# Patient Record
Sex: Male | Born: 1955 | Race: White | Hispanic: No | Marital: Married | Smoking: Current every day smoker
Health system: Southern US, Community
[De-identification: ages and names within clinical notes are randomized; demographics above are authoritative.]

## PROBLEM LIST (undated history)

## (undated) DIAGNOSIS — F419 Anxiety disorder, unspecified: Secondary | ICD-10-CM

## (undated) DIAGNOSIS — J449 Chronic obstructive pulmonary disease, unspecified: Secondary | ICD-10-CM

## (undated) DIAGNOSIS — Z972 Presence of dental prosthetic device (complete) (partial): Secondary | ICD-10-CM

## (undated) DIAGNOSIS — C099 Malignant neoplasm of tonsil, unspecified: Secondary | ICD-10-CM

## (undated) DIAGNOSIS — J410 Simple chronic bronchitis: Secondary | ICD-10-CM

## (undated) DIAGNOSIS — J690 Pneumonitis due to inhalation of food and vomit: Secondary | ICD-10-CM

## (undated) DIAGNOSIS — R32 Unspecified urinary incontinence: Secondary | ICD-10-CM

## (undated) DIAGNOSIS — G629 Polyneuropathy, unspecified: Secondary | ICD-10-CM

## (undated) DIAGNOSIS — R413 Other amnesia: Secondary | ICD-10-CM

## (undated) DIAGNOSIS — M1990 Unspecified osteoarthritis, unspecified site: Secondary | ICD-10-CM

## (undated) DIAGNOSIS — C801 Malignant (primary) neoplasm, unspecified: Secondary | ICD-10-CM

## (undated) DIAGNOSIS — K9423 Gastrostomy malfunction: Principal | ICD-10-CM

## (undated) HISTORY — PX: TONSILLECTOMY: SUR1361

## (undated) HISTORY — DX: Malignant neoplasm of tonsil, unspecified: C09.9

## (undated) HISTORY — DX: Gastrostomy malfunction: K94.23

## (undated) HISTORY — PX: APPENDECTOMY: SHX54

## (undated) HISTORY — DX: Other amnesia: R41.3

## (undated) HISTORY — DX: Polyneuropathy, unspecified: G62.9

---

## 1999-11-22 ENCOUNTER — Emergency Department (HOSPITAL_COMMUNITY): Admission: EM | Admit: 1999-11-22 | Discharge: 1999-11-22 | Payer: Self-pay | Admitting: Emergency Medicine

## 1999-11-22 ENCOUNTER — Encounter: Payer: Self-pay | Admitting: Emergency Medicine

## 2005-08-18 HISTORY — PX: CHOLECYSTECTOMY: SHX55

## 2006-06-10 ENCOUNTER — Emergency Department: Payer: Self-pay | Admitting: Emergency Medicine

## 2007-06-05 ENCOUNTER — Emergency Department: Payer: Self-pay | Admitting: Emergency Medicine

## 2008-03-23 ENCOUNTER — Other Ambulatory Visit: Payer: Self-pay

## 2008-03-24 ENCOUNTER — Inpatient Hospital Stay: Payer: Self-pay | Admitting: Internal Medicine

## 2008-06-09 ENCOUNTER — Emergency Department: Payer: Self-pay | Admitting: Emergency Medicine

## 2008-07-18 ENCOUNTER — Ambulatory Visit: Payer: Self-pay | Admitting: Radiation Oncology

## 2008-08-04 ENCOUNTER — Ambulatory Visit: Payer: Self-pay | Admitting: Otolaryngology

## 2008-08-07 ENCOUNTER — Inpatient Hospital Stay: Payer: Self-pay | Admitting: Otolaryngology

## 2008-08-18 ENCOUNTER — Ambulatory Visit: Payer: Self-pay | Admitting: Radiation Oncology

## 2008-08-22 ENCOUNTER — Ambulatory Visit: Payer: Self-pay | Admitting: Radiation Oncology

## 2008-08-31 ENCOUNTER — Ambulatory Visit: Payer: Self-pay | Admitting: General Surgery

## 2008-09-09 ENCOUNTER — Emergency Department: Payer: Self-pay | Admitting: Emergency Medicine

## 2008-09-18 ENCOUNTER — Ambulatory Visit: Payer: Self-pay | Admitting: Radiation Oncology

## 2008-10-16 ENCOUNTER — Ambulatory Visit: Payer: Self-pay | Admitting: Radiation Oncology

## 2008-11-16 ENCOUNTER — Ambulatory Visit: Payer: Self-pay | Admitting: Radiation Oncology

## 2008-11-27 ENCOUNTER — Inpatient Hospital Stay: Payer: Self-pay | Admitting: Oncology

## 2008-12-02 ENCOUNTER — Inpatient Hospital Stay: Payer: Self-pay | Admitting: Internal Medicine

## 2008-12-16 ENCOUNTER — Ambulatory Visit: Payer: Self-pay | Admitting: Radiation Oncology

## 2009-01-16 ENCOUNTER — Ambulatory Visit: Payer: Self-pay | Admitting: Radiation Oncology

## 2009-02-15 ENCOUNTER — Ambulatory Visit: Payer: Self-pay | Admitting: Radiation Oncology

## 2009-03-18 ENCOUNTER — Ambulatory Visit: Payer: Self-pay | Admitting: Radiation Oncology

## 2009-04-18 ENCOUNTER — Ambulatory Visit: Payer: Self-pay | Admitting: Radiation Oncology

## 2009-04-30 ENCOUNTER — Ambulatory Visit: Payer: Self-pay | Admitting: Oncology

## 2009-05-18 ENCOUNTER — Ambulatory Visit: Payer: Self-pay | Admitting: Radiation Oncology

## 2009-06-18 ENCOUNTER — Ambulatory Visit: Payer: Self-pay | Admitting: Radiation Oncology

## 2009-06-18 ENCOUNTER — Ambulatory Visit: Payer: Self-pay | Admitting: Oncology

## 2009-06-29 ENCOUNTER — Emergency Department: Payer: Self-pay | Admitting: Emergency Medicine

## 2009-07-18 ENCOUNTER — Ambulatory Visit: Payer: Self-pay | Admitting: Radiation Oncology

## 2009-08-18 ENCOUNTER — Ambulatory Visit: Payer: Self-pay | Admitting: Radiation Oncology

## 2009-08-23 ENCOUNTER — Ambulatory Visit: Payer: Self-pay | Admitting: Oncology

## 2009-09-05 ENCOUNTER — Ambulatory Visit: Payer: Self-pay | Admitting: Radiation Oncology

## 2009-09-06 ENCOUNTER — Emergency Department: Payer: Self-pay | Admitting: Emergency Medicine

## 2009-09-16 ENCOUNTER — Emergency Department: Payer: Self-pay | Admitting: Emergency Medicine

## 2009-09-18 ENCOUNTER — Ambulatory Visit: Payer: Self-pay | Admitting: Radiation Oncology

## 2009-10-05 ENCOUNTER — Ambulatory Visit: Payer: Self-pay | Admitting: Otolaryngology

## 2009-10-23 ENCOUNTER — Ambulatory Visit: Payer: Self-pay | Admitting: Oncology

## 2009-11-16 ENCOUNTER — Ambulatory Visit: Payer: Self-pay | Admitting: Oncology

## 2009-12-16 ENCOUNTER — Ambulatory Visit: Payer: Self-pay | Admitting: Oncology

## 2010-01-03 ENCOUNTER — Emergency Department (HOSPITAL_COMMUNITY): Admission: EM | Admit: 2010-01-03 | Discharge: 2010-01-04 | Payer: Self-pay | Admitting: Internal Medicine

## 2010-01-04 ENCOUNTER — Ambulatory Visit: Payer: Self-pay | Admitting: Psychiatry

## 2010-01-04 ENCOUNTER — Inpatient Hospital Stay (HOSPITAL_COMMUNITY): Admission: EM | Admit: 2010-01-04 | Discharge: 2010-01-08 | Payer: Self-pay | Admitting: Psychiatry

## 2010-01-16 ENCOUNTER — Ambulatory Visit: Payer: Self-pay | Admitting: Oncology

## 2010-01-17 ENCOUNTER — Ambulatory Visit: Payer: Self-pay | Admitting: Oncology

## 2010-02-07 ENCOUNTER — Ambulatory Visit: Payer: Self-pay | Admitting: Surgery

## 2010-02-11 ENCOUNTER — Ambulatory Visit: Payer: Self-pay | Admitting: Surgery

## 2010-02-15 ENCOUNTER — Ambulatory Visit: Payer: Self-pay | Admitting: Oncology

## 2010-03-18 ENCOUNTER — Ambulatory Visit: Payer: Self-pay | Admitting: Oncology

## 2010-04-18 ENCOUNTER — Ambulatory Visit: Payer: Self-pay | Admitting: Oncology

## 2010-05-18 ENCOUNTER — Ambulatory Visit: Payer: Self-pay | Admitting: Oncology

## 2010-06-18 ENCOUNTER — Ambulatory Visit: Payer: Self-pay | Admitting: Oncology

## 2010-07-30 ENCOUNTER — Ambulatory Visit: Payer: Self-pay | Admitting: Oncology

## 2010-08-18 ENCOUNTER — Ambulatory Visit: Payer: Self-pay | Admitting: Oncology

## 2010-10-30 ENCOUNTER — Ambulatory Visit: Payer: Self-pay | Admitting: Oncology

## 2010-11-04 LAB — COMPREHENSIVE METABOLIC PANEL
ALT: 28 U/L (ref 0–53)
AST: 56 U/L — ABNORMAL HIGH (ref 0–37)
Albumin: 4.3 g/dL (ref 3.5–5.2)
Alkaline Phosphatase: 83 U/L (ref 39–117)
BUN: 9 mg/dL (ref 6–23)
CO2: 26 mEq/L (ref 19–32)
Calcium: 9.2 mg/dL (ref 8.4–10.5)
Chloride: 103 mEq/L (ref 96–112)
Creatinine, Ser: 0.84 mg/dL (ref 0.4–1.5)
GFR calc Af Amer: >60 mL/min (ref >60)
GFR calc non Af Amer: >60 mL/min (ref >60)
Glucose, Bld: 155 mg/dL — ABNORMAL HIGH (ref 70–99)
Potassium: 3.5 mEq/L (ref 3.5–5.1)
Sodium: 142 mEq/L (ref 135–145)
Total Bilirubin: 0.4 mg/dL (ref 0.3–1.2)
Total Protein: 7.4 g/dL (ref 6.0–8.3)

## 2010-11-04 LAB — CBC
HCT: 38.1 % — ABNORMAL LOW (ref 39.0–52.0)
Hemoglobin: 11.4 g/dL — ABNORMAL LOW (ref 13.0–17.0)
Hemoglobin: 13.3 g/dL (ref 13.0–17.0)
MCHC: 33.5 g/dL (ref 30.0–36.0)
MCHC: 34.9 g/dL (ref 30.0–36.0)
MCV: 92 fL (ref 78.0–100.0)
Platelets: 212 10*3/uL (ref 150–400)
Platelets: 213 10*3/uL (ref 150–400)
RBC: 4.14 MIL/uL — ABNORMAL LOW (ref 4.22–5.81)
RDW: 13.3 % (ref 11.5–15.5)
RDW: 13.4 % (ref 11.5–15.5)
WBC: 8.3 10*3/uL (ref 4.0–10.5)

## 2010-11-04 LAB — DIFFERENTIAL
Basophils Absolute: 0 10*3/uL (ref 0.0–0.1)
Basophils Relative: 0 % (ref 0–1)
Eosinophils Absolute: 0.1 10*3/uL (ref 0.0–0.7)
Eosinophils Relative: 1 % (ref 0–5)
Lymphocytes Relative: 13 % (ref 12–46)
Lymphs Abs: 1.1 10*3/uL (ref 0.7–4.0)
Monocytes Absolute: 0.7 10*3/uL (ref 0.1–1.0)
Monocytes Relative: 8 % (ref 3–12)
Neutro Abs: 6.5 10*3/uL (ref 1.7–7.7)
Neutrophils Relative %: 78 % — ABNORMAL HIGH (ref 43–77)

## 2010-11-04 LAB — URINALYSIS, ROUTINE W REFLEX MICROSCOPIC
Nitrite: NEGATIVE
Specific Gravity, Urine: 1.007 (ref 1.005–1.030)
Urobilinogen, UA: 0.2 mg/dL (ref 0.0–1.0)
pH: 6 (ref 5.0–8.0)

## 2010-11-04 LAB — BASIC METABOLIC PANEL
BUN: 13 mg/dL (ref 6–23)
CO2: 27 mEq/L (ref 19–32)
Calcium: 8.8 mg/dL (ref 8.4–10.5)
Creatinine, Ser: 1.18 mg/dL (ref 0.4–1.5)
Glucose, Bld: 99 mg/dL (ref 70–99)
Sodium: 140 mEq/L (ref 135–145)

## 2010-11-04 LAB — SALICYLATE LEVEL: Salicylate Lvl: <4 mg/dL (ref 2.8–20.0)

## 2010-11-04 LAB — URINE MICROSCOPIC-ADD ON

## 2010-11-04 LAB — PROTIME-INR: INR: 0.98 (ref 0.00–1.49)

## 2010-11-04 LAB — RAPID URINE DRUG SCREEN, HOSP PERFORMED: Tetrahydrocannabinol: NOT DETECTED

## 2010-11-04 LAB — APTT: aPTT: 28 seconds (ref 24–37)

## 2010-11-04 LAB — ACETAMINOPHEN LEVEL: Acetaminophen (Tylenol), Serum: <10 ug/mL — ABNORMAL LOW (ref 10–30)

## 2010-11-04 LAB — CLOSTRIDIUM DIFFICILE EIA

## 2010-11-04 LAB — ETHANOL: Alcohol, Ethyl (B): 165 mg/dL — ABNORMAL HIGH (ref 0–10)

## 2010-11-17 ENCOUNTER — Ambulatory Visit: Payer: Self-pay | Admitting: Oncology

## 2010-11-21 ENCOUNTER — Emergency Department: Payer: Self-pay | Admitting: Emergency Medicine

## 2011-01-29 ENCOUNTER — Ambulatory Visit: Payer: Self-pay | Admitting: Oncology

## 2011-02-16 ENCOUNTER — Ambulatory Visit: Payer: Self-pay | Admitting: Oncology

## 2011-03-27 ENCOUNTER — Emergency Department: Payer: Self-pay | Admitting: Internal Medicine

## 2011-03-28 ENCOUNTER — Emergency Department: Payer: Self-pay | Admitting: Unknown Physician Specialty

## 2011-05-01 ENCOUNTER — Ambulatory Visit: Payer: Self-pay | Admitting: Oncology

## 2011-05-05 ENCOUNTER — Ambulatory Visit: Payer: Self-pay | Admitting: Oncology

## 2011-05-19 ENCOUNTER — Ambulatory Visit: Payer: Self-pay | Admitting: Oncology

## 2011-09-18 ENCOUNTER — Emergency Department (HOSPITAL_COMMUNITY): Payer: No Typology Code available for payment source

## 2011-09-18 ENCOUNTER — Encounter (HOSPITAL_COMMUNITY): Payer: Self-pay | Admitting: Emergency Medicine

## 2011-09-18 ENCOUNTER — Emergency Department (HOSPITAL_COMMUNITY)
Admission: EM | Admit: 2011-09-18 | Discharge: 2011-09-18 | Disposition: A | Discharge status: Home / Self Care | Payer: No Typology Code available for payment source | Attending: Emergency Medicine | Admitting: Emergency Medicine

## 2011-09-18 DIAGNOSIS — M545 Low back pain, unspecified: Secondary | ICD-10-CM | POA: Insufficient documentation

## 2011-09-18 DIAGNOSIS — F17200 Nicotine dependence, unspecified, uncomplicated: Secondary | ICD-10-CM | POA: Insufficient documentation

## 2011-09-18 DIAGNOSIS — Z79899 Other long term (current) drug therapy: Secondary | ICD-10-CM | POA: Insufficient documentation

## 2011-09-18 HISTORY — DX: Malignant (primary) neoplasm, unspecified: C80.1

## 2011-09-18 MED ORDER — IBUPROFEN 800 MG PO TABS: 800.0000 mg | ORAL_TABLET | Freq: Three times a day (TID) | ORAL | Status: AC | PRN

## 2011-09-18 MED ORDER — DIAZEPAM 5 MG PO TABS: 5.0000 mg | ORAL_TABLET | Freq: Three times a day (TID) | ORAL | Status: AC | PRN
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
## 2011-09-19 NOTE — ED Provider Notes (Signed)
Medical screening examination/treatment/procedure(s) were performed by non-physician practitioner and as supervising physician I was immediately available for consultation/collaboration.  Chi Garlow M Eyad Rochford, MD 09/19/11 1715 

## 2011-12-05 ENCOUNTER — Ambulatory Visit: Payer: Self-pay | Admitting: Oncology

## 2011-12-05 LAB — CBC CANCER CENTER
Basophil %: 0.3 %
Eosinophil #: 0.1 x10 3/mm (ref 0.0–0.7)
Eosinophil %: 0.8 %
HCT: 38.7 % — ABNORMAL LOW (ref 40.0–52.0)
Lymphocyte %: 11.4 %
MCHC: 32.6 g/dL (ref 32.0–36.0)
Monocyte %: 4.4 %
Neutrophil #: 6.5 x10 3/mm (ref 1.4–6.5)
Platelet: 189 x10 3/mm (ref 150–440)
WBC: 7.8 x10 3/mm (ref 3.8–10.6)

## 2011-12-17 ENCOUNTER — Ambulatory Visit: Payer: Self-pay | Admitting: Oncology

## 2012-06-11 ENCOUNTER — Ambulatory Visit: Payer: Self-pay | Admitting: Oncology

## 2012-06-11 LAB — CBC CANCER CENTER
Basophil #: 0 x10 3/mm (ref 0.0–0.1)
Basophil %: 0.6 %
Eosinophil #: 0.1 x10 3/mm (ref 0.0–0.7)
HCT: 36 % — ABNORMAL LOW (ref 40.0–52.0)
MCH: 28.9 pg (ref 26.0–34.0)
MCHC: 32 g/dL (ref 32.0–36.0)
Monocyte #: 0.3 x10 3/mm (ref 0.2–1.0)
Monocyte %: 4.2 %
Neutrophil %: 81.4 %
Platelet: 296 x10 3/mm (ref 150–440)
RDW: 14.1 % (ref 11.5–14.5)

## 2012-06-11 LAB — COMPREHENSIVE METABOLIC PANEL
Alkaline Phosphatase: 109 U/L (ref 50–136)
Calcium, Total: 8.7 mg/dL (ref 8.5–10.1)
Co2: 28 mmol/L (ref 21–32)
EGFR (African American): >60
EGFR (Non-African Amer.): >60
Glucose: 189 mg/dL — ABNORMAL HIGH (ref 65–99)
SGPT (ALT): 20 U/L (ref 12–78)

## 2012-06-18 ENCOUNTER — Ambulatory Visit: Payer: Self-pay | Admitting: Oncology

## 2012-06-21 ENCOUNTER — Inpatient Hospital Stay: Payer: Self-pay | Admitting: Internal Medicine

## 2012-06-21 LAB — COMPREHENSIVE METABOLIC PANEL
Alkaline Phosphatase: 138 U/L — ABNORMAL HIGH (ref 50–136)
BUN: 8 mg/dL (ref 7–18)
Bilirubin,Total: 0.8 mg/dL (ref 0.2–1.0)
Chloride: 96 mmol/L — ABNORMAL LOW (ref 98–107)
Creatinine: 1.1 mg/dL (ref 0.60–1.30)
EGFR (African American): >60
EGFR (Non-African Amer.): >60
Potassium: 3.8 mmol/L (ref 3.5–5.1)
SGOT(AST): 39 U/L — ABNORMAL HIGH (ref 15–37)
SGPT (ALT): 29 U/L (ref 12–78)
Total Protein: 7.5 g/dL (ref 6.4–8.2)

## 2012-06-21 LAB — TROPONIN I: Troponin-I: <0.02 ng/mL

## 2012-06-21 LAB — CBC
HCT: 30.5 % — ABNORMAL LOW (ref 40.0–52.0)
HGB: 10 g/dL — ABNORMAL LOW (ref 13.0–18.0)
MCH: 29 pg (ref 26.0–34.0)
MCHC: 32.9 g/dL (ref 32.0–36.0)
MCV: 88 fL (ref 80–100)
RDW: 13.8 % (ref 11.5–14.5)

## 2012-06-21 LAB — URINALYSIS, COMPLETE
Bilirubin,UR: NEGATIVE
Ketone: NEGATIVE
Leukocyte Esterase: NEGATIVE
Ph: 7 (ref 4.5–8.0)
Protein: NEGATIVE
RBC,UR: NONE SEEN /HPF (ref 0–5)
Specific Gravity: 1.003 (ref 1.003–1.030)
Squamous Epithelial: <1

## 2012-06-22 LAB — CBC WITH DIFFERENTIAL/PLATELET
Basophil %: 0 %
Eosinophil #: 0 10*3/uL (ref 0.0–0.7)
Eosinophil %: 0 %
HCT: 31.7 % — ABNORMAL LOW (ref 40.0–52.0)
Lymphocyte %: 3.4 %
MCHC: 33.9 g/dL (ref 32.0–36.0)
Monocyte %: 1.3 %
Neutrophil #: 8.2 10*3/uL — ABNORMAL HIGH (ref 1.4–6.5)
Neutrophil %: 95.3 %
RDW: 13.7 % (ref 11.5–14.5)
WBC: 8.6 10*3/uL (ref 3.8–10.6)

## 2012-06-22 LAB — COMPREHENSIVE METABOLIC PANEL
Albumin: 2.7 g/dL — ABNORMAL LOW (ref 3.4–5.0)
Alkaline Phosphatase: 124 U/L (ref 50–136)
BUN: 12 mg/dL (ref 7–18)
Bilirubin,Total: 0.7 mg/dL (ref 0.2–1.0)
Co2: 27 mmol/L (ref 21–32)
Creatinine: 0.99 mg/dL (ref 0.60–1.30)
EGFR (African American): >60
EGFR (Non-African Amer.): >60
Potassium: 3.8 mmol/L (ref 3.5–5.1)
SGPT (ALT): 25 U/L (ref 12–78)

## 2012-06-22 LAB — CK: CK, Total: 43 U/L (ref 35–232)

## 2012-06-23 LAB — URINE CULTURE

## 2012-06-24 ENCOUNTER — Ambulatory Visit: Payer: Self-pay | Admitting: Oncology

## 2012-06-27 LAB — CULTURE, BLOOD (SINGLE)

## 2012-06-29 ENCOUNTER — Encounter: Payer: Self-pay | Admitting: Cardiology

## 2012-07-26 ENCOUNTER — Ambulatory Visit: Payer: Self-pay | Admitting: Otolaryngology
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
## 2012-10-17 ENCOUNTER — Inpatient Hospital Stay: Payer: Self-pay | Admitting: Internal Medicine

## 2012-10-17 LAB — URINALYSIS, COMPLETE
Bacteria: NONE SEEN
Bilirubin,UR: NEGATIVE
Blood: NEGATIVE
Ketone: NEGATIVE
Leukocyte Esterase: NEGATIVE
Nitrite: NEGATIVE
Protein: NEGATIVE
Squamous Epithelial: <1

## 2012-10-17 LAB — COMPREHENSIVE METABOLIC PANEL
Alkaline Phosphatase: 91 U/L (ref 50–136)
Anion Gap: 3 — ABNORMAL LOW (ref 7–16)
BUN: 24 mg/dL — ABNORMAL HIGH (ref 7–18)
Bilirubin,Total: 0.4 mg/dL (ref 0.2–1.0)
Calcium, Total: 8.3 mg/dL — ABNORMAL LOW (ref 8.5–10.1)
Chloride: 95 mmol/L — ABNORMAL LOW (ref 98–107)
Creatinine: 1.32 mg/dL — ABNORMAL HIGH (ref 0.60–1.30)
EGFR (African American): >60
EGFR (Non-African Amer.): 60 — ABNORMAL LOW
Glucose: 121 mg/dL — ABNORMAL HIGH (ref 65–99)
Osmolality: 264 (ref 275–301)
SGOT(AST): 38 U/L — ABNORMAL HIGH (ref 15–37)
SGPT (ALT): 28 U/L (ref 12–78)
Sodium: 129 mmol/L — ABNORMAL LOW (ref 136–145)
Total Protein: 7.6 g/dL (ref 6.4–8.2)

## 2012-10-17 LAB — CBC
HGB: 11.4 g/dL — ABNORMAL LOW (ref 13.0–18.0)
MCH: 28.2 pg (ref 26.0–34.0)
MCHC: 32.3 g/dL (ref 32.0–36.0)
MCV: 87 fL (ref 80–100)
Platelet: 240 10*3/uL (ref 150–440)
RBC: 4.03 10*6/uL — ABNORMAL LOW (ref 4.40–5.90)
RDW: 15 % — ABNORMAL HIGH (ref 11.5–14.5)

## 2012-10-17 LAB — CK TOTAL AND CKMB (NOT AT ARMC): CK-MB: 1.6 ng/mL (ref 0.5–3.6)

## 2012-10-18 LAB — CBC WITH DIFFERENTIAL/PLATELET
Basophil #: 0 10*3/uL
Basophil %: 0.2 %
Eosinophil #: 0.2 10*3/uL
Eosinophil %: 1.4 %
HCT: 34 % — ABNORMAL LOW
HGB: 11.2 g/dL — ABNORMAL LOW
Lymphocyte %: 5.2 %
Lymphs Abs: 0.9 10*3/uL — ABNORMAL LOW
MCH: 28.8 pg
MCHC: 33 g/dL
MCV: 87 fL
Monocyte #: 0.5 10*3/uL
Monocyte %: 2.8 %
Neutrophil #: 15.6 10*3/uL — ABNORMAL HIGH
Neutrophil %: 90.4 %
Platelet: 218 10*3/uL
RBC: 3.89 x10 6/mm 3 — ABNORMAL LOW
RDW: 15.2 % — ABNORMAL HIGH
WBC: 17.2 10*3/uL — ABNORMAL HIGH

## 2012-10-18 LAB — BASIC METABOLIC PANEL WITH GFR
Anion Gap: 3 — ABNORMAL LOW
BUN: 17 mg/dL
Calcium, Total: 8.7 mg/dL
Chloride: 105 mmol/L
Co2: 31 mmol/L
Creatinine: 0.95 mg/dL
EGFR (African American): >60
EGFR (Non-African Amer.): >60
Glucose: 92 mg/dL
Osmolality: 279
Potassium: 4.2 mmol/L
Sodium: 139 mmol/L

## 2012-10-19 LAB — CBC WITH DIFFERENTIAL/PLATELET
Basophil %: 0.1 %
HCT: 33.5 % — ABNORMAL LOW (ref 40.0–52.0)
HGB: 10.9 g/dL — ABNORMAL LOW (ref 13.0–18.0)
Lymphocyte %: 4.2 %
MCH: 28.7 pg (ref 26.0–34.0)
Monocyte %: 2.4 %
Neutrophil %: 92.9 %
RBC: 3.8 10*6/uL — ABNORMAL LOW (ref 4.40–5.90)
RDW: 14.6 % — ABNORMAL HIGH (ref 11.5–14.5)
WBC: 17.5 10*3/uL — ABNORMAL HIGH (ref 3.8–10.6)

## 2012-10-21 LAB — CBC WITH DIFFERENTIAL/PLATELET
Basophil %: 0.3 %
Lymphocyte %: 8.2 %
MCH: 29.3 pg (ref 26.0–34.0)
MCHC: 33.7 g/dL (ref 32.0–36.0)
MCV: 87 fL (ref 80–100)
Monocyte #: 0.5 x10 3/mm (ref 0.2–1.0)
Monocyte %: 5.1 %
Neutrophil #: 8.8 10*3/uL — ABNORMAL HIGH (ref 1.4–6.5)
Neutrophil %: 83.1 %
Platelet: 201 10*3/uL (ref 150–440)
RBC: 3.48 10*6/uL — ABNORMAL LOW (ref 4.40–5.90)

## 2012-10-21 LAB — VANCOMYCIN, TROUGH: Vancomycin, Trough: 13 ug/mL (ref 10–20)

## 2012-10-22 HISTORY — PX: PEG TUBE PLACEMENT: SUR1034

## 2012-10-23 LAB — CULTURE, BLOOD (SINGLE)

## 2012-10-23 LAB — EXPECTORATED SPUTUM ASSESSMENT W GRAM STAIN, RFLX TO RESP C

## 2012-10-24 LAB — PHOSPHORUS: Phosphorus: 3.6 mg/dL (ref 2.5–4.9)

## 2012-10-24 LAB — CULTURE, BLOOD (SINGLE)

## 2012-11-16 ENCOUNTER — Ambulatory Visit: Payer: Self-pay | Admitting: Oncology

## 2012-12-08 ENCOUNTER — Ambulatory Visit: Payer: Self-pay | Admitting: Otolaryngology

## 2012-12-16 ENCOUNTER — Ambulatory Visit: Payer: Self-pay | Admitting: Oncology

## 2012-12-30 ENCOUNTER — Encounter: Payer: Self-pay | Admitting: Cardiology

## 2013-02-06 ENCOUNTER — Inpatient Hospital Stay: Payer: Self-pay | Admitting: Family Medicine

## 2013-02-06 LAB — CBC
HGB: 12.3 g/dL — ABNORMAL LOW (ref 13.0–18.0)
MCH: 29.8 pg (ref 26.0–34.0)
MCHC: 33.9 g/dL (ref 32.0–36.0)
Platelet: 197 10*3/uL (ref 150–440)
RDW: 14.5 % (ref 11.5–14.5)
WBC: 11.3 10*3/uL — ABNORMAL HIGH (ref 3.8–10.6)

## 2013-02-06 LAB — COMPREHENSIVE METABOLIC PANEL
Alkaline Phosphatase: 116 U/L (ref 50–136)
Anion Gap: 5 — ABNORMAL LOW (ref 7–16)
Bilirubin,Total: 0.6 mg/dL (ref 0.2–1.0)
EGFR (African American): >60
EGFR (Non-African Amer.): 55 — ABNORMAL LOW
Glucose: 129 mg/dL — ABNORMAL HIGH (ref 65–99)
SGOT(AST): 27 U/L (ref 15–37)

## 2013-02-07 LAB — CBC WITH DIFFERENTIAL/PLATELET
Basophil #: 0 10*3/uL (ref 0.0–0.1)
Eosinophil %: 0 %
HCT: 32.3 % — ABNORMAL LOW (ref 40.0–52.0)
HGB: 10.8 g/dL — ABNORMAL LOW (ref 13.0–18.0)
Lymphocyte %: 4.9 %
MCH: 29.7 pg (ref 26.0–34.0)
Monocyte #: 1 x10 3/mm (ref 0.2–1.0)
Monocyte %: 5.9 %
Neutrophil #: 15.2 10*3/uL — ABNORMAL HIGH (ref 1.4–6.5)
Platelet: 177 10*3/uL (ref 150–440)
RBC: 3.64 10*6/uL — ABNORMAL LOW (ref 4.40–5.90)

## 2013-02-07 LAB — BASIC METABOLIC PANEL
Calcium, Total: 8.5 mg/dL (ref 8.5–10.1)
Chloride: 104 mmol/L (ref 98–107)
Co2: 28 mmol/L (ref 21–32)
EGFR (African American): >60
Glucose: 140 mg/dL — ABNORMAL HIGH (ref 65–99)
Osmolality: 286 (ref 275–301)
Sodium: 141 mmol/L (ref 136–145)

## 2013-02-07 LAB — HEMOGLOBIN A1C: Hemoglobin A1C: 6 % (ref 4.2–6.3)

## 2013-02-08 LAB — CBC WITH DIFFERENTIAL/PLATELET
Basophil #: 0 10*3/uL (ref 0.0–0.1)
Eosinophil #: 0.1 10*3/uL (ref 0.0–0.7)
HCT: 28.3 % — ABNORMAL LOW (ref 40.0–52.0)
Lymphocyte #: 0.7 10*3/uL — ABNORMAL LOW (ref 1.0–3.6)
Lymphocyte %: 5.8 %
MCH: 28.9 pg (ref 26.0–34.0)
MCHC: 32.8 g/dL (ref 32.0–36.0)
MCV: 88 fL (ref 80–100)
Monocyte #: 0.7 x10 3/mm (ref 0.2–1.0)
Neutrophil #: 11.3 10*3/uL — ABNORMAL HIGH (ref 1.4–6.5)
Neutrophil %: 88.2 %
RBC: 3.2 10*6/uL — ABNORMAL LOW (ref 4.40–5.90)
RDW: 14.3 % (ref 11.5–14.5)
WBC: 12.8 10*3/uL — ABNORMAL HIGH (ref 3.8–10.6)

## 2013-02-09 LAB — CBC WITH DIFFERENTIAL/PLATELET
Basophil %: 0.1 %
Eosinophil #: 0 10*3/uL (ref 0.0–0.7)
Eosinophil %: 0 %
MCH: 29.9 pg (ref 26.0–34.0)
MCV: 89 fL (ref 80–100)
Monocyte %: 3.4 %
Neutrophil #: 10.8 10*3/uL — ABNORMAL HIGH (ref 1.4–6.5)
Platelet: 192 10*3/uL (ref 150–440)
RBC: 3.9 10*6/uL — ABNORMAL LOW (ref 4.40–5.90)
RDW: 14.5 % (ref 11.5–14.5)
WBC: 11.9 10*3/uL — ABNORMAL HIGH (ref 3.8–10.6)

## 2013-02-10 LAB — CBC WITH DIFFERENTIAL/PLATELET
Basophil #: 0 10*3/uL (ref 0.0–0.1)
Basophil %: 0 %
Eosinophil %: 0 %
HCT: 31.4 % — ABNORMAL LOW (ref 40.0–52.0)
Lymphocyte #: 0.9 10*3/uL — ABNORMAL LOW (ref 1.0–3.6)
Lymphocyte %: 5.1 %
MCHC: 33.6 g/dL (ref 32.0–36.0)
MCV: 89 fL (ref 80–100)
Monocyte #: 0.8 x10 3/mm (ref 0.2–1.0)
Neutrophil #: 15.8 10*3/uL — ABNORMAL HIGH (ref 1.4–6.5)
Neutrophil %: 90.5 %
Platelet: 194 10*3/uL (ref 150–440)
WBC: 17.5 10*3/uL — ABNORMAL HIGH (ref 3.8–10.6)

## 2013-02-10 LAB — BASIC METABOLIC PANEL
BUN: 23 mg/dL — ABNORMAL HIGH (ref 7–18)
Co2: 35 mmol/L — ABNORMAL HIGH (ref 21–32)
EGFR (African American): >60
EGFR (Non-African Amer.): >60
Glucose: 210 mg/dL — ABNORMAL HIGH (ref 65–99)
Sodium: 141 mmol/L (ref 136–145)

## 2013-02-10 LAB — MAGNESIUM: Magnesium: 1.6 mg/dL — ABNORMAL LOW

## 2013-02-12 LAB — CULTURE, BLOOD (SINGLE)

## 2013-02-14 LAB — CULTURE, BLOOD (SINGLE)

## 2013-03-28 ENCOUNTER — Inpatient Hospital Stay: Payer: Self-pay | Admitting: Internal Medicine

## 2013-03-28 LAB — CBC WITH DIFFERENTIAL/PLATELET
Basophil #: 0 10*3/uL (ref 0.0–0.1)
Basophil %: 0.1 %
Eosinophil %: 0 %
HCT: 39.3 % — ABNORMAL LOW (ref 40.0–52.0)
Lymphocyte #: 0.5 10*3/uL — ABNORMAL LOW (ref 1.0–3.6)
Lymphocyte %: 4.5 %
MCH: 29.7 pg (ref 26.0–34.0)
MCHC: 33.8 g/dL (ref 32.0–36.0)
MCV: 88 fL (ref 80–100)
Monocyte %: 3.7 %
Neutrophil #: 10 10*3/uL — ABNORMAL HIGH (ref 1.4–6.5)
Neutrophil %: 91.7 %
Platelet: 150 10*3/uL (ref 150–440)
RBC: 4.48 10*6/uL (ref 4.40–5.90)
WBC: 10.9 10*3/uL — ABNORMAL HIGH (ref 3.8–10.6)

## 2013-03-28 LAB — COMPREHENSIVE METABOLIC PANEL
Alkaline Phosphatase: 100 U/L (ref 50–136)
Chloride: 95 mmol/L — ABNORMAL LOW (ref 98–107)
Creatinine: 1.27 mg/dL (ref 0.60–1.30)
EGFR (African American): >60
Glucose: 170 mg/dL — ABNORMAL HIGH (ref 65–99)
Osmolality: 272 (ref 275–301)
SGOT(AST): 29 U/L (ref 15–37)
SGPT (ALT): 25 U/L (ref 12–78)
Sodium: 132 mmol/L — ABNORMAL LOW (ref 136–145)
Total Protein: 7.5 g/dL (ref 6.4–8.2)

## 2013-03-28 LAB — URINALYSIS, COMPLETE
Bacteria: NONE SEEN
Bilirubin,UR: NEGATIVE
Hyaline Cast: 2
Leukocyte Esterase: NEGATIVE
Ph: 6 (ref 4.5–8.0)
Protein: 30
RBC,UR: 1 /HPF (ref 0–5)
Specific Gravity: 1.016 (ref 1.003–1.030)

## 2013-03-28 LAB — TROPONIN I: Troponin-I: <0.02 ng/mL

## 2013-03-29 LAB — CBC WITH DIFFERENTIAL/PLATELET
Basophil #: 0 10*3/uL (ref 0.0–0.1)
Basophil %: 0.1 %
Eosinophil #: 0 10*3/uL (ref 0.0–0.7)
Eosinophil %: 0.3 %
HCT: 29.3 % — ABNORMAL LOW (ref 40.0–52.0)
HGB: 9.9 g/dL — ABNORMAL LOW (ref 13.0–18.0)
Lymphocyte #: 0.7 10*3/uL — ABNORMAL LOW (ref 1.0–3.6)
Lymphocyte %: 5.4 %
MCH: 29.3 pg (ref 26.0–34.0)
MCHC: 33.7 g/dL (ref 32.0–36.0)
MCV: 87 fL (ref 80–100)
Monocyte %: 6.8 %
Neutrophil #: 11.6 10*3/uL — ABNORMAL HIGH (ref 1.4–6.5)
Neutrophil %: 87.4 %
RBC: 3.37 10*6/uL — ABNORMAL LOW (ref 4.40–5.90)
RDW: 15 % — ABNORMAL HIGH (ref 11.5–14.5)
WBC: 13.2 10*3/uL — ABNORMAL HIGH (ref 3.8–10.6)

## 2013-03-29 LAB — BASIC METABOLIC PANEL
BUN: 14 mg/dL (ref 7–18)
Calcium, Total: 8.2 mg/dL — ABNORMAL LOW (ref 8.5–10.1)
Creatinine: 0.73 mg/dL (ref 0.60–1.30)
EGFR (African American): >60
Osmolality: 286 (ref 275–301)
Potassium: 3.4 mmol/L — ABNORMAL LOW (ref 3.5–5.1)
Sodium: 141 mmol/L (ref 136–145)

## 2013-03-29 LAB — MAGNESIUM: Magnesium: 2 mg/dL

## 2013-03-29 LAB — URINE CULTURE

## 2013-03-30 LAB — HEMOGLOBIN A1C: Hemoglobin A1C: 8.6 % — ABNORMAL HIGH (ref 4.2–6.3)

## 2013-03-30 LAB — PHOSPHORUS: Phosphorus: 2.8 mg/dL (ref 2.5–4.9)

## 2013-03-30 LAB — POTASSIUM: Potassium: 3.7 mmol/L (ref 3.5–5.1)

## 2013-04-02 LAB — CULTURE, BLOOD (SINGLE)

## 2013-06-11 ENCOUNTER — Inpatient Hospital Stay: Payer: Self-pay | Admitting: Internal Medicine

## 2013-06-11 LAB — CBC
HCT: 29.7 % — ABNORMAL LOW (ref 40.0–52.0)
HGB: 9.9 g/dL — ABNORMAL LOW (ref 13.0–18.0)
MCV: 84 fL (ref 80–100)
RBC: 3.56 10*6/uL — ABNORMAL LOW (ref 4.40–5.90)

## 2013-06-11 LAB — BASIC METABOLIC PANEL
Anion Gap: 7 (ref 7–16)
BUN: 27 mg/dL — ABNORMAL HIGH (ref 7–18)
Calcium, Total: 9.3 mg/dL (ref 8.5–10.1)
Chloride: 95 mmol/L — ABNORMAL LOW (ref 98–107)
Co2: 30 mmol/L (ref 21–32)
EGFR (African American): >60
Glucose: 239 mg/dL — ABNORMAL HIGH (ref 65–99)
Osmolality: 277 (ref 275–301)
Potassium: 4.2 mmol/L (ref 3.5–5.1)

## 2013-06-11 LAB — HEPATIC FUNCTION PANEL A (ARMC)
Albumin: 2.5 g/dL — ABNORMAL LOW (ref 3.4–5.0)
Alkaline Phosphatase: 163 U/L — ABNORMAL HIGH (ref 50–136)
Bilirubin, Direct: <0.1 mg/dL (ref 0.00–0.20)
SGPT (ALT): 25 U/L (ref 12–78)
Total Protein: 7.1 g/dL (ref 6.4–8.2)

## 2013-06-11 LAB — CK TOTAL AND CKMB (NOT AT ARMC)
CK, Total: 42 U/L (ref 35–232)
CK-MB: 1.1 ng/mL (ref 0.5–3.6)

## 2013-06-11 LAB — LIPASE, BLOOD: Lipase: 23 U/L — ABNORMAL LOW (ref 73–393)

## 2013-06-11 LAB — MAGNESIUM: Magnesium: 1.5 mg/dL — ABNORMAL LOW

## 2013-06-12 LAB — CBC WITH DIFFERENTIAL/PLATELET
Bands: 7 %
HCT: 27.7 % — ABNORMAL LOW (ref 40.0–52.0)
HGB: 8.9 g/dL — ABNORMAL LOW (ref 13.0–18.0)
Lymphocytes: 4 %
MCH: 27.3 pg (ref 26.0–34.0)
MCHC: 32.2 g/dL (ref 32.0–36.0)
Monocytes: 3 %
Platelet: 304 10*3/uL (ref 150–440)
RDW: 16.7 % — ABNORMAL HIGH (ref 11.5–14.5)
Segmented Neutrophils: 86 %
WBC: 23.1 10*3/uL — ABNORMAL HIGH (ref 3.8–10.6)

## 2013-06-12 LAB — BASIC METABOLIC PANEL
BUN: 19 mg/dL — ABNORMAL HIGH (ref 7–18)
Calcium, Total: 9.3 mg/dL (ref 8.5–10.1)
Co2: 32 mmol/L (ref 21–32)
Creatinine: 0.92 mg/dL (ref 0.60–1.30)
EGFR (African American): >60
EGFR (Non-African Amer.): >60
Glucose: 265 mg/dL — ABNORMAL HIGH (ref 65–99)
Osmolality: 285 (ref 275–301)
Potassium: 4.6 mmol/L (ref 3.5–5.1)

## 2013-06-13 LAB — VANCOMYCIN, TROUGH: Vancomycin, Trough: 13 ug/mL (ref 10–20)

## 2013-06-13 LAB — CBC WITH DIFFERENTIAL/PLATELET
Bands: 5 %
HGB: 8.7 g/dL — ABNORMAL LOW (ref 13.0–18.0)
Lymphocytes: 14 %
MCH: 27.9 pg (ref 26.0–34.0)
MCHC: 33 g/dL (ref 32.0–36.0)
Platelet: 292 10*3/uL (ref 150–440)
RBC: 3.11 10*6/uL — ABNORMAL LOW (ref 4.40–5.90)
Segmented Neutrophils: 72 %

## 2013-06-14 LAB — EXPECTORATED SPUTUM ASSESSMENT W GRAM STAIN, RFLX TO RESP C

## 2013-06-16 LAB — CULTURE, BLOOD (SINGLE)

## 2013-06-20 ENCOUNTER — Ambulatory Visit: Payer: Self-pay | Admitting: Oncology

## 2013-07-18 ENCOUNTER — Ambulatory Visit: Payer: Self-pay | Admitting: Oncology

## 2014-01-03 ENCOUNTER — Ambulatory Visit: Payer: Self-pay | Admitting: Oncology

## 2014-01-03 LAB — COMPREHENSIVE METABOLIC PANEL
ALK PHOS: 113 U/L
ANION GAP: 7 (ref 7–16)
Albumin: 3.4 g/dL (ref 3.4–5.0)
BILIRUBIN TOTAL: 0.3 mg/dL (ref 0.2–1.0)
BUN: 32 mg/dL — ABNORMAL HIGH (ref 7–18)
Calcium, Total: 8.7 mg/dL (ref 8.5–10.1)
Chloride: 99 mmol/L (ref 98–107)
Co2: 34 mmol/L — ABNORMAL HIGH (ref 21–32)
Creatinine: 0.91 mg/dL (ref 0.60–1.30)
EGFR (Non-African Amer.): >60
GLUCOSE: 135 mg/dL — AB (ref 65–99)
Osmolality: 288 (ref 275–301)
POTASSIUM: 4.5 mmol/L (ref 3.5–5.1)
SGOT(AST): 17 U/L (ref 15–37)
SGPT (ALT): 22 U/L (ref 12–78)
Sodium: 140 mmol/L (ref 136–145)
TOTAL PROTEIN: 7.6 g/dL (ref 6.4–8.2)

## 2014-01-03 LAB — CBC CANCER CENTER
BASOS PCT: 0.5 %
Basophil #: 0 x10 3/mm (ref 0.0–0.1)
EOS PCT: 2 %
Eosinophil #: 0.2 x10 3/mm (ref 0.0–0.7)
HCT: 32.3 % — ABNORMAL LOW (ref 40.0–52.0)
HGB: 10.7 g/dL — ABNORMAL LOW (ref 13.0–18.0)
Lymphocyte #: 1.1 x10 3/mm (ref 1.0–3.6)
Lymphocyte %: 13.4 %
MCH: 27.8 pg (ref 26.0–34.0)
MCHC: 33 g/dL (ref 32.0–36.0)
MCV: 84 fL (ref 80–100)
Monocyte #: 0.7 x10 3/mm (ref 0.2–1.0)
Monocyte %: 8.1 %
NEUTROS PCT: 76 %
Neutrophil #: 6.3 x10 3/mm (ref 1.4–6.5)
PLATELETS: 232 x10 3/mm (ref 150–440)
RBC: 3.83 10*6/uL — ABNORMAL LOW (ref 4.40–5.90)
RDW: 16.1 % — ABNORMAL HIGH (ref 11.5–14.5)
WBC: 8.2 x10 3/mm (ref 3.8–10.6)

## 2014-01-16 ENCOUNTER — Ambulatory Visit: Payer: Self-pay | Admitting: Oncology

## 2014-04-20 ENCOUNTER — Emergency Department: Payer: Self-pay | Admitting: Student

## 2014-08-31 ENCOUNTER — Ambulatory Visit: Payer: Self-pay | Admitting: Oncology

## 2014-08-31 LAB — BASIC METABOLIC PANEL
Anion Gap: 4 — ABNORMAL LOW (ref 7–16)
BUN: 24 mg/dL — ABNORMAL HIGH (ref 7–18)
CHLORIDE: 98 mmol/L (ref 98–107)
Calcium, Total: 8.6 mg/dL (ref 8.5–10.1)
Co2: 38 mmol/L — ABNORMAL HIGH (ref 21–32)
Creatinine: 1.03 mg/dL (ref 0.60–1.30)
EGFR (African American): >60
EGFR (Non-African Amer.): >60
Glucose: 148 mg/dL — ABNORMAL HIGH (ref 65–99)
OSMOLALITY: 286 (ref 275–301)
POTASSIUM: 4 mmol/L (ref 3.5–5.1)
Sodium: 140 mmol/L (ref 136–145)

## 2014-08-31 LAB — CBC CANCER CENTER
BASOS PCT: 0.1 %
Basophil #: 0 x10 3/mm (ref 0.0–0.1)
EOS ABS: 0.1 x10 3/mm (ref 0.0–0.7)
Eosinophil %: 0.5 %
HCT: 43.6 % (ref 40.0–52.0)
HGB: 14 g/dL (ref 13.0–18.0)
LYMPHS ABS: 1.2 x10 3/mm (ref 1.0–3.6)
Lymphocyte %: 5.9 %
MCH: 28.8 pg (ref 26.0–34.0)
MCHC: 32.1 g/dL (ref 32.0–36.0)
MCV: 90 fL (ref 80–100)
Monocyte #: 0.7 x10 3/mm (ref 0.2–1.0)
Monocyte %: 3.4 %
NEUTROS ABS: 18.7 x10 3/mm — AB (ref 1.4–6.5)
NEUTROS PCT: 90.1 %
Platelet: 161 x10 3/mm (ref 150–440)
RBC: 4.85 10*6/uL (ref 4.40–5.90)
RDW: 15.4 % — ABNORMAL HIGH (ref 11.5–14.5)
WBC: 20.8 x10 3/mm — ABNORMAL HIGH (ref 3.8–10.6)

## 2014-08-31 LAB — TSH: THYROID STIMULATING HORM: 0.842 u[IU]/mL

## 2014-08-31 LAB — T4, FREE: FREE THYROXINE: 1.3 ng/dL (ref 0.76–1.46)

## 2014-09-01 ENCOUNTER — Emergency Department: Payer: Self-pay | Admitting: Emergency Medicine

## 2014-09-01 LAB — CBC WITH DIFFERENTIAL/PLATELET
Basophil #: 0 10*3/uL (ref 0.0–0.1)
Basophil %: 0.1 %
EOS ABS: 0 10*3/uL (ref 0.0–0.7)
EOS PCT: 0.3 %
HCT: 40.6 % (ref 40.0–52.0)
HGB: 12.9 g/dL — AB (ref 13.0–18.0)
Lymphocyte #: 0.6 10*3/uL — ABNORMAL LOW (ref 1.0–3.6)
Lymphocyte %: 4.1 %
MCH: 28.8 pg (ref 26.0–34.0)
MCHC: 31.8 g/dL — AB (ref 32.0–36.0)
MCV: 91 fL (ref 80–100)
MONOS PCT: 2.9 %
Monocyte #: 0.4 x10 3/mm (ref 0.2–1.0)
Neutrophil #: 12.6 10*3/uL — ABNORMAL HIGH (ref 1.4–6.5)
Neutrophil %: 92.6 %
Platelet: 151 10*3/uL (ref 150–440)
RBC: 4.48 10*6/uL (ref 4.40–5.90)
RDW: 15.8 % — AB (ref 11.5–14.5)
WBC: 13.6 10*3/uL — AB (ref 3.8–10.6)

## 2014-09-01 LAB — COMPREHENSIVE METABOLIC PANEL
ALBUMIN: 3.3 g/dL — AB (ref 3.4–5.0)
ALK PHOS: 102 U/L
Anion Gap: 6 — ABNORMAL LOW (ref 7–16)
BILIRUBIN TOTAL: 0.4 mg/dL (ref 0.2–1.0)
BUN: 30 mg/dL — AB (ref 7–18)
CO2: 34 mmol/L — AB (ref 21–32)
CREATININE: 0.93 mg/dL (ref 0.60–1.30)
Calcium, Total: 8.4 mg/dL — ABNORMAL LOW (ref 8.5–10.1)
Chloride: 94 mmol/L — ABNORMAL LOW (ref 98–107)
EGFR (African American): >60
Glucose: 196 mg/dL — ABNORMAL HIGH (ref 65–99)
OSMOLALITY: 280 (ref 275–301)
Potassium: 4.1 mmol/L (ref 3.5–5.1)
SGOT(AST): 22 U/L (ref 15–37)
SGPT (ALT): 24 U/L
SODIUM: 134 mmol/L — AB (ref 136–145)
TOTAL PROTEIN: 7.2 g/dL (ref 6.4–8.2)

## 2014-09-18 ENCOUNTER — Ambulatory Visit: Payer: Self-pay | Admitting: Oncology

## 2014-10-23 ENCOUNTER — Ambulatory Visit: Payer: Self-pay | Admitting: Gastroenterology

## 2014-11-18 ENCOUNTER — Encounter: Payer: Self-pay | Admitting: Internal Medicine

## 2014-11-18 ENCOUNTER — Inpatient Hospital Stay (HOSPITAL_COMMUNITY)
Admission: AD | Admit: 2014-11-18 | Discharge: 2014-11-21 | DRG: 871 | Disposition: A | Discharge status: Home / Self Care | Payer: Medicaid Other | Source: Other Acute Inpatient Hospital | Attending: Family Medicine | Admitting: Family Medicine

## 2014-11-18 ENCOUNTER — Emergency Department
Admit: 2014-11-18 | Disposition: A | Discharge status: Other Acute Inpatient Hospital | Payer: Self-pay | Admitting: Internal Medicine

## 2014-11-18 DIAGNOSIS — R413 Other amnesia: Secondary | ICD-10-CM | POA: Diagnosis not present

## 2014-11-18 DIAGNOSIS — I959 Hypotension, unspecified: Secondary | ICD-10-CM | POA: Diagnosis present

## 2014-11-18 DIAGNOSIS — Z885 Allergy status to narcotic agent status: Secondary | ICD-10-CM | POA: Diagnosis not present

## 2014-11-18 DIAGNOSIS — Z9049 Acquired absence of other specified parts of digestive tract: Secondary | ICD-10-CM | POA: Diagnosis present

## 2014-11-18 DIAGNOSIS — Z87891 Personal history of nicotine dependence: Secondary | ICD-10-CM | POA: Diagnosis not present

## 2014-11-18 DIAGNOSIS — A419 Sepsis, unspecified organism: Principal | ICD-10-CM | POA: Diagnosis present

## 2014-11-18 DIAGNOSIS — Z7901 Long term (current) use of anticoagulants: Secondary | ICD-10-CM

## 2014-11-18 DIAGNOSIS — K9423 Gastrostomy malfunction: Secondary | ICD-10-CM

## 2014-11-18 DIAGNOSIS — F329 Major depressive disorder, single episode, unspecified: Secondary | ICD-10-CM | POA: Diagnosis present

## 2014-11-18 DIAGNOSIS — Z931 Gastrostomy status: Secondary | ICD-10-CM

## 2014-11-18 DIAGNOSIS — C099 Malignant neoplasm of tonsil, unspecified: Secondary | ICD-10-CM

## 2014-11-18 DIAGNOSIS — J189 Pneumonia, unspecified organism: Secondary | ICD-10-CM

## 2014-11-18 DIAGNOSIS — R627 Adult failure to thrive: Secondary | ICD-10-CM | POA: Diagnosis present

## 2014-11-18 DIAGNOSIS — J9601 Acute respiratory failure with hypoxia: Secondary | ICD-10-CM | POA: Diagnosis present

## 2014-11-18 DIAGNOSIS — D61818 Other pancytopenia: Secondary | ICD-10-CM | POA: Diagnosis present

## 2014-11-18 DIAGNOSIS — R509 Fever, unspecified: Secondary | ICD-10-CM | POA: Diagnosis present

## 2014-11-18 DIAGNOSIS — G629 Polyneuropathy, unspecified: Secondary | ICD-10-CM

## 2014-11-18 DIAGNOSIS — J690 Pneumonitis due to inhalation of food and vomit: Secondary | ICD-10-CM

## 2014-11-18 DIAGNOSIS — Z923 Personal history of irradiation: Secondary | ICD-10-CM

## 2014-11-18 HISTORY — DX: Polyneuropathy, unspecified: G62.9

## 2014-11-18 HISTORY — DX: Other amnesia: R41.3

## 2014-11-18 HISTORY — DX: Gastrostomy malfunction: K94.23

## 2014-11-18 HISTORY — DX: Malignant neoplasm of tonsil, unspecified: C09.9

## 2014-11-18 HISTORY — DX: Pneumonitis due to inhalation of food and vomit: J69.0

## 2014-11-18 LAB — CBC WITH DIFFERENTIAL/PLATELET
BASOS PCT: 0 % (ref 0–1)
Basophil #: 0 10*3/uL (ref 0.0–0.1)
Basophil %: 0.1 %
Basophils Absolute: 0 10*3/uL (ref 0.0–0.1)
EOS ABS: 0 10*3/uL (ref 0.0–0.7)
Eosinophil %: 0 %
Eosinophils Absolute: 0 10*3/uL (ref 0.0–0.7)
Eosinophils Relative: 0 % (ref 0–5)
HCT: 33.6 % — AB (ref 40.0–52.0)
HEMATOCRIT: 32.1 % — AB (ref 39.0–52.0)
HGB: 10.8 g/dL — ABNORMAL LOW (ref 13.0–18.0)
Hemoglobin: 10 g/dL — ABNORMAL LOW (ref 13.0–17.0)
LYMPHS ABS: 0.6 10*3/uL — AB (ref 1.0–3.6)
LYMPHS ABS: 1 10*3/uL (ref 0.7–4.0)
LYMPHS PCT: 2.3 %
Lymphocytes Relative: 6 % — ABNORMAL LOW (ref 12–46)
MCH: 29.3 pg (ref 26.0–34.0)
MCH: 29.7 pg (ref 26.0–34.0)
MCHC: 32.1 g/dL (ref 32.0–36.0)
MCHC: 31.2 g/dL (ref 30.0–36.0)
MCV: 91 fL (ref 80–100)
MCV: 95.3 fL (ref 78.0–100.0)
MONOS PCT: 3.4 %
MONOS PCT: 5 % (ref 3–12)
Monocyte #: 0.9 x10 3/mm (ref 0.2–1.0)
Monocytes Absolute: 0.9 10*3/uL (ref 0.1–1.0)
NEUTROS ABS: 24.2 10*3/uL — AB (ref 1.4–6.5)
NEUTROS PCT: 89 % — AB (ref 43–77)
Neutro Abs: 14.8 10*3/uL — ABNORMAL HIGH (ref 1.7–7.7)
Neutrophil %: 94.2 %
PLATELETS: 149 10*3/uL — AB (ref 150–440)
PLATELETS: 125 10*3/uL — AB (ref 150–400)
RBC: 3.69 10*6/uL — ABNORMAL LOW (ref 4.40–5.90)
RBC: 3.37 MIL/uL — ABNORMAL LOW (ref 4.22–5.81)
RDW: 15 % — AB (ref 11.5–14.5)
RDW: 15 % (ref 11.5–15.5)
WBC: 25.7 10*3/uL — AB (ref 3.8–10.6)
WBC: 16.8 10*3/uL — AB (ref 4.0–10.5)

## 2014-11-18 LAB — URINALYSIS, COMPLETE
BACTERIA: NONE SEEN
BILIRUBIN, UR: NEGATIVE
Blood: NEGATIVE
Glucose,UR: 150 mg/dL (ref 0–75)
KETONE: NEGATIVE
Leukocyte Esterase: NEGATIVE
Nitrite: NEGATIVE
Ph: 8 (ref 4.5–8.0)
Protein: NEGATIVE
Specific Gravity: 1.013 (ref 1.003–1.030)

## 2014-11-18 LAB — BASIC METABOLIC PANEL
ANION GAP: 6 (ref 5–15)
BUN: 15 mg/dL (ref 6–23)
CHLORIDE: 99 mmol/L (ref 96–112)
CO2: 29 mmol/L (ref 19–32)
CREATININE: 0.73 mg/dL (ref 0.50–1.35)
Calcium: 7.9 mg/dL — ABNORMAL LOW (ref 8.4–10.5)
GFR calc Af Amer: >90 mL/min (ref >90)
GFR calc non Af Amer: >90 mL/min (ref >90)
Glucose, Bld: 139 mg/dL — ABNORMAL HIGH (ref 70–99)
Potassium: 4 mmol/L (ref 3.5–5.1)
SODIUM: 134 mmol/L — AB (ref 135–145)

## 2014-11-18 LAB — MRSA PCR SCREENING: MRSA by PCR: NEGATIVE

## 2014-11-18 LAB — APTT: aPTT: 40 seconds — ABNORMAL HIGH (ref 24–37)

## 2014-11-18 LAB — COMPREHENSIVE METABOLIC PANEL
ALBUMIN: 3.1 g/dL — AB
ANION GAP: 11 (ref 7–16)
Alkaline Phosphatase: 79 U/L
BUN: 21 mg/dL — AB
Bilirubin,Total: 0.5 mg/dL
CREATININE: 0.79 mg/dL
Calcium, Total: 8.6 mg/dL — ABNORMAL LOW
Chloride: 97 mmol/L — ABNORMAL LOW
Co2: 26 mmol/L
EGFR (African American): >60
EGFR (Non-African Amer.): >60
Glucose: 265 mg/dL — ABNORMAL HIGH
Potassium: 4.7 mmol/L
SGOT(AST): 25 U/L
SGPT (ALT): 20 U/L
SODIUM: 134 mmol/L — AB
Total Protein: 5.8 g/dL — ABNORMAL LOW

## 2014-11-18 LAB — PROTIME-INR
INR: 1.1
INR: 1.18 (ref 0.00–1.49)
Prothrombin Time: 14.2 secs
Prothrombin Time: 15.1 seconds (ref 11.6–15.2)

## 2014-11-18 LAB — MAGNESIUM: Magnesium: 1.8 mg/dL

## 2014-11-18 LAB — PHOSPHORUS: PHOSPHORUS: 2.4 mg/dL — AB

## 2014-11-18 LAB — TROPONIN I: Troponin-I: 0.17 ng/mL — ABNORMAL HIGH

## 2014-11-18 LAB — LACTIC ACID, PLASMA: LACTIC ACID, VENOUS: 1.5 mmol/L

## 2014-11-18 MED ORDER — PIPERACILLIN-TAZOBACTAM 3.375 G IVPB 30 MIN
3.3750 g | Freq: Once | INTRAVENOUS | Status: AC
  Administered 2014-11-18: 3.375 g via INTRAVENOUS
  Filled 2014-11-18: qty 50

## 2014-11-18 MED ORDER — PANTOPRAZOLE SODIUM 40 MG PO TBEC: 40.0000 mg | DELAYED_RELEASE_TABLET | Freq: Every day | ORAL | Status: DC

## 2014-11-18 MED ORDER — ALBUTEROL SULFATE HFA 108 (90 BASE) MCG/ACT IN AERS: 2.0000 | INHALATION_SPRAY | Freq: Four times a day (QID) | RESPIRATORY_TRACT | Status: DC | PRN

## 2014-11-18 MED ORDER — ETODOLAC 400 MG PO TABS
400.0000 mg | ORAL_TABLET | ORAL | Status: DC
  Administered 2014-11-18: 400 mg via ORAL
  Filled 2014-11-18: qty 1

## 2014-11-18 MED ORDER — SODIUM CHLORIDE 0.9 % IV BOLUS (SEPSIS)
250.0000 mL | Freq: Once | INTRAVENOUS | Status: AC
  Administered 2014-11-18: 250 mL via INTRAVENOUS

## 2014-11-18 MED ORDER — DESVENLAFAXINE SUCCINATE ER 50 MG PO TB24
50.0000 mg | ORAL_TABLET | Freq: Every day | ORAL | Status: DC
  Filled 2014-11-18: qty 1

## 2014-11-18 MED ORDER — FLUTICASONE PROPIONATE 50 MCG/ACT NA SUSP
2.0000 | Freq: Every day | NASAL | Status: DC
  Administered 2014-11-18 – 2014-11-21 (×4): 2 via NASAL
  Filled 2014-11-18: qty 16

## 2014-11-18 MED ORDER — VENLAFAXINE HCL 37.5 MG PO TABS
37.5000 mg | ORAL_TABLET | Freq: Two times a day (BID) | ORAL | Status: DC
  Administered 2014-11-19 – 2014-11-20 (×4): 37.5 mg
  Filled 2014-11-18 (×6): qty 1

## 2014-11-18 MED ORDER — PANTOPRAZOLE SODIUM 40 MG PO PACK
40.0000 mg | PACK | Freq: Two times a day (BID) | ORAL | Status: DC
  Administered 2014-11-18 – 2014-11-20 (×4): 40 mg
  Filled 2014-11-18 (×5): qty 20

## 2014-11-18 MED ORDER — CLONAZEPAM 1 MG PO TABS
1.0000 mg | ORAL_TABLET | Freq: Two times a day (BID) | ORAL | Status: DC | PRN
  Administered 2014-11-20: 1 mg via ORAL
  Filled 2014-11-18: qty 2

## 2014-11-18 MED ORDER — GABAPENTIN 600 MG PO TABS
600.0000 mg | ORAL_TABLET | Freq: Four times a day (QID) | ORAL | Status: DC
  Administered 2014-11-18: 600 mg via ORAL
  Filled 2014-11-18 (×2): qty 1

## 2014-11-18 MED ORDER — SODIUM CHLORIDE 0.9 % IV SOLN
INTRAVENOUS | Status: DC
  Administered 2014-11-18: 20:00:00 via INTRAVENOUS
  Administered 2014-11-19 (×2): 1000 mL via INTRAVENOUS
  Administered 2014-11-20: 03:00:00 via INTRAVENOUS

## 2014-11-18 MED ORDER — OXYCODONE HCL 5 MG PO TABS
30.0000 mg | ORAL_TABLET | Freq: Four times a day (QID) | ORAL | Status: DC
  Administered 2014-11-18 – 2014-11-21 (×10): 30 mg via NASOGASTRIC
  Filled 2014-11-18 (×10): qty 6

## 2014-11-18 MED ORDER — GABAPENTIN 250 MG/5ML PO SOLN
800.0000 mg | Freq: Three times a day (TID) | ORAL | Status: DC
  Administered 2014-11-18 – 2014-11-20 (×6): 800 mg
  Filled 2014-11-18 (×10): qty 16

## 2014-11-18 MED ORDER — GLUCERNA 1.2 CAL PO LIQD
1000.0000 mL | ORAL | Status: DC
  Administered 2014-11-18: 480 mL
  Filled 2014-11-18: qty 1000

## 2014-11-18 MED ORDER — OXYCODONE HCL 10 MG PO TB12: 5.0000 mg | ORAL_TABLET | Freq: Four times a day (QID) | ORAL | Status: DC

## 2014-11-18 MED ORDER — ARIPIPRAZOLE 2 MG PO TABS
2.0000 mg | ORAL_TABLET | Freq: Every day | ORAL | Status: DC
  Administered 2014-11-18 – 2014-11-21 (×4): 2 mg via ORAL
  Filled 2014-11-18 (×4): qty 1

## 2014-11-18 MED ORDER — TRAZODONE HCL 150 MG PO TABS
150.0000 mg | ORAL_TABLET | Freq: Every day | ORAL | Status: DC
  Administered 2014-11-18 – 2014-11-19 (×2): 150 mg
  Filled 2014-11-18 (×3): qty 1

## 2014-11-18 MED ORDER — HEPARIN SODIUM (PORCINE) 5000 UNIT/ML IJ SOLN
5000.0000 [IU] | Freq: Three times a day (TID) | INTRAMUSCULAR | Status: DC
  Administered 2014-11-18 – 2014-11-21 (×8): 5000 [IU] via SUBCUTANEOUS
  Filled 2014-11-18 (×9): qty 1

## 2014-11-18 MED ORDER — BECLOMETHASONE DIPROPIONATE 40 MCG/ACT IN AERS
2.0000 | INHALATION_SPRAY | Freq: Two times a day (BID) | RESPIRATORY_TRACT | Status: DC
  Filled 2014-11-18: qty 8.7

## 2014-11-18 MED ORDER — BUDESONIDE-FORMOTEROL FUMARATE 160-4.5 MCG/ACT IN AERO
2.0000 | INHALATION_SPRAY | Freq: Two times a day (BID) | RESPIRATORY_TRACT | Status: DC
  Administered 2014-11-18 – 2014-11-20 (×4): 2 via RESPIRATORY_TRACT
  Filled 2014-11-18: qty 6

## 2014-11-18 MED ORDER — SIMVASTATIN 20 MG PO TABS
20.0000 mg | ORAL_TABLET | Freq: Every day | ORAL | Status: DC
  Administered 2014-11-19 – 2014-11-20 (×2): 20 mg
  Filled 2014-11-18 (×3): qty 1

## 2014-11-18 NOTE — Progress Notes (Signed)
Kathline Magic NP made aware of B/Ps 75-84/50-60. Pt had received Oxycodone at [place] orders obtained

## 2014-11-18 NOTE — Progress Notes (Signed)
Patient transferred to 2C15 from [facility]. Wife at bedside.

## 2014-11-18 NOTE — H&P (Signed)
[facility] History and Physical  Mark [name] JQZ:[id number] DOB: 24-Apr-1956 DOA: 11/18/2014  Referring physician: [facility] PCP: No primary care provider on file.  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 [employer], Dr. Wandra [name] ENT-TOnsillectomy, COPD,  Prior episdoes of Aspiration PNA--supposed to be using a feeding tube.  Can eat "pleasure foods" [not supposed to drink this liquids], still smoker sinc eage 14- prior 2ppd 1/2ppd [Pulm Dr. 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    Wife describes recent treatment of aspiration pneumonia 3/10-3/24 with by mouth Levaquin Has had a PEG tube 2014--New one placed march 10/23/2014--same size.  Wife noted TME encephalopathy consistent with his prior episodes of Pna-went to [facility].  Went to [facility] and transferred to [facility] SDU as no beds at [facility] ICU.    At [facility] noted WBC 25, CXR=PNa and transferredot [facility]     Review of Systems:  1 cp, -f, -n, -vomit, -double visioojn, -blurred vision, -unilat weak, -rash +sputum-->white + brown.  -chills,   NO melena, no hemoptysis   Past Medical History  Diagnosis Date  . Cancer     tonsilal ca  . Aspiration pneumonia    Past Surgical History  Procedure Laterality Date  . Appendectomy    . Cholecystectomy    . 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 Morphine And Related Nausea And Vomiting    Family  History  Problem Relation Age of Onset  . Liver cancer Mother   . Stroke Father   . Testicular cancer Father     Prior to Admission medications   Medication Sig Start Date End Date Taking? Authorizing Provider  ARIPiprazole (ABILIFY) 2 MG tablet 2 mg by PEG Tube route daily.    Yes Historical Provider, MD  albuterol (PROVENTIL HFA;VENTOLIN HFA) 108 (90 BASE) MCG/ACT inhaler Inhale 2 puffs into the lungs every 6 (six) hours as needed. For shortness of breath    Historical Provider, MD  beclomethasone (QVAR) 40 MCG/ACT inhaler Inhale 2 puffs into the lungs daily as needed. For shortness of breath    Historical Provider, MD  clonazePAM (KLONOPIN) 1 MG tablet Take 1 mg by mouth 2 (two) times daily as needed. For anxiety    Historical Provider, MD  desvenlafaxine (PRISTIQ) 50 MG 24 hr tablet Take 50 mg by mouth at bedtime.    Historical Provider, MD  esomeprazole (NEXIUM) 40 MG capsule Take 40 mg by mouth at bedtime.    Historical Provider, MD  etodolac (LODINE) 400 MG tablet Take 400 mg by mouth every other day.    Historical Provider, MD  gabapentin (NEURONTIN) 600 MG tablet Take 600 mg by mouth 4 (four) times daily.    Historical Provider, MD  lisinopril (PRINIVIL,ZESTRIL) 30 MG tablet Take 30 mg by mouth at bedtime.    Historical Provider, MD  oxyCODONE Mark [name])  10 MG 12 hr tablet Take 5-10 mg by mouth 4 (four) times daily.    Historical Provider, MD  saxagliptin HCl (ONGLYZA) 2.5 MG TABS tablet Take 5 mg by mouth daily.    Historical Provider, MD  simvastatin (ZOCOR) 20 MG tablet Take 20 mg by mouth at bedtime.    Historical Provider, MD   Physical Exam: Filed Vitals:   11/18/14 1716  Pulse: 82  Temp: 97.5 F (36.4 C)  TempSrc: Oral  Resp: 17  Height: 6' (1.829 m)  Weight: 66.9 kg (147 lb 7.8 oz)  SpO2: 93%    EOMI NCAT frail, bitemporal wasting, On the left side of his nose he has an area of excoriation where he fell. Poor dentition with denture upper and lower, Mallampati  2 Chest is wheezy but no rales no rhonchi S1-S2 no murmur rub or gallop, slightly tachycardic Abdomen soft nontender nondistended no rebound no guarding Lower back is somewhat tender and paraspinal musculature and it is difficult for him to sit up however he does not describe any anesthesia and has good sensation in his lower extremities Upper extremity power is 5/5 Euthymic and congruent  Labs on Admission:  Basic Metabolic Panel: No results for input(s): NA, K, CL, CO2, GLUCOSE, BUN, CREATININE, CALCIUM, MG, PHOS in the last 168 hours. Liver Function Tests: No results for input(s): AST, ALT, ALKPHOS, BILITOT, PROT, ALBUMIN in the last 168 hours. No results for input(s): LIPASE, AMYLASE in the last 168 hours. No results for input(s): AMMONIA in the last 168 hours. CBC: No results for input(s): WBC, NEUTROABS, HGB, HCT, MCV, PLT in the last 168 hours. Cardiac Enzymes: No results for input(s): CKTOTAL, CKMB, CKMBINDEX, TROPONINI in the last 168 hours.  BNP (last 3 results) No results for input(s): BNP in the last 8760 hours.  ProBNP (last 3 results) No results for input(s): PROBNP in the last 8760 hours.  CBG: No results for input(s): GLUCAP in the last 168 hours.  Radiological Exams on Admission: No results found.  EKG: Independently reviewed. Sinus tachycardia, PR interval 0.12, QRS axis 45, borderline criteria for LVH however it T waves across precordium.  Assessment/Plan Active Problems:     Aspiration pneumonia-review of records from [place] [facility] reveals white count of 25, x-ray done there shows left lower lobe pneumonia which is consistent with a pneumonia that the patient was treated for 3/10-3/24 and Levaquin at this time I think it would be reasonable to continue the patient on aspiration coverage with Zosyn monotherapy. There is low risk for this patient having MRSA or other type of pathogen. We will obtain sputum culture and admit him under the pneumonia order  set to step down unit His blood pressure is already better compared to his constitutional low blood pressures that were worsened by his sepsis over at [facility]. He received apparently 2bags of fluids  and 3 different antibiotics according to his wife.     Tonsillar cancer, S/P surgery, chemotherapy XRT 2009-followed at [place]-patient will follow-up with his regular oncologist at [facility] Dr. Grayland Ormond    Peripheral neuropathy-unclear etiology, outpatient workup and follow-up   Adult failure to thrive-patient to continue PEG feeds. He usually uses Glucerna.   Recurrent aspiration pneumonia--going for outpatient realistically probably needs to have goals of care or at least and delineation of goals of care as an outpatient. He has had 8-9 episodes of aspiration per his wife and has stage IV tonsillar cancer although this is stable according to what she says.  Memory deficit-wife reports that the deficit came about with the XRT see above discussion   PEG tube -  Time spent: 55 minutes  Discussed with wife at bedside  Full  Verlon Au [facility] Pager [phone]  If 7PM-7AM, please contact night-coverage [email or web address] Password [facility] 11/18/2014, 6:32 PM

## 2014-11-18 NOTE — Progress Notes (Signed)
ANTIBIOTIC CONSULT NOTE - INITIAL  Pharmacy Consult for Zosyn Indication: Aspiriation PNA  No Known Allergies  Patient Measurements: Height: 6' (182.9 cm) Weight: 147 lb 7.8 oz (66.9 kg) IBW/kg (Calculated) : 77.6 Adjusted Body Weight:    Vital Signs: Temp: 97.5 F (36.4 C) (04/02 1716) Temp Source: Oral (04/02 1716) Pulse Rate: 82 (04/02 1716) Intake/Output from previous day:   Intake/Output from this shift:    Labs: No results for input(s): WBC, HGB, PLT, LABCREA, CREATININE in the last 72 hours. CrCl cannot be calculated (Patient has no serum creatinine result on file.). No results for input(s): VANCOTROUGH, VANCOPEAK, VANCORANDOM, GENTTROUGH, GENTPEAK, GENTRANDOM, TOBRATROUGH, TOBRAPEAK, TOBRARND, AMIKACINPEAK, AMIKACINTROU, AMIKACIN in the last 72 hours.   Microbiology: No results found for this or any previous visit (from the past 720 hour(s)).  Medical History: Past Medical History  Diagnosis Date  . Cancer     tonsilal ca  . Aspiration pneumonia     Medications:  Prescriptions prior to admission  Medication Sig Dispense Refill Last Dose  . albuterol (PROVENTIL HFA;VENTOLIN HFA) 108 (90 BASE) MCG/ACT inhaler Inhale 2 puffs into the lungs every 6 (six) hours as needed. For shortness of breath   09/18/2011 at Unknown  . ARIPiprazole (ABILIFY) 2 MG tablet Take 2 mg by mouth daily.   09/17/2011 at Unknown  . beclomethasone (QVAR) 40 MCG/ACT inhaler Inhale 2 puffs into the lungs daily as needed. For shortness of breath   Past Month at Unknown  . clonazePAM (KLONOPIN) 1 MG tablet Take 1 mg by mouth 2 (two) times daily as needed. For anxiety   09/18/2011 at Unknown  . desvenlafaxine (PRISTIQ) 50 MG 24 hr tablet Take 50 mg by mouth at bedtime.   09/17/2011 at Unknown  . esomeprazole (NEXIUM) 40 MG capsule Take 40 mg by mouth at bedtime.   09/17/2011 at Unknown  . etodolac (LODINE) 400 MG tablet Take 400 mg by mouth every other day.   09/17/2011 at Unknown  . gabapentin  (NEURONTIN) 600 MG tablet Take 600 mg by mouth 4 (four) times daily.   09/18/2011 at Unknown  . lisinopril (PRINIVIL,ZESTRIL) 30 MG tablet Take 30 mg by mouth at bedtime.   09/17/2011 at Unknown  . oxyCODONE (OXYCONTIN) 10 MG 12 hr tablet Take 5-10 mg by mouth 4 (four) times daily.   09/18/2011 at Unknown  . saxagliptin HCl (ONGLYZA) 2.5 MG TABS tablet Take 5 mg by mouth daily.   09/18/2011 at Unknown  . simvastatin (ZOCOR) 20 MG tablet Take 20 mg by mouth at bedtime.   09/17/2011 at Unknown   Assessment: 59 y/o M h/o multiple episodes of aspiration PNA presents with encephalopathy consistent with his prior episodes of PNA. Significant PMH noted. Afebrile. Labs: WBC 25.7 at [place], 16.8 at [facility]. Do not see Scr in [place] labs sent over.  Goal of Therapy:  Treatment of aspiration PNA  Plan:  Zosyn 3.375g IV q8hr. Will f/u Scr to adjust dosing if CrCl<20   Narda Fundora S. Alford Highland, PharmD, BCPS Clinical Staff Pharmacist Pager [phone]  Eilene Ghazi Stillinger 11/18/2014,6:04 PM

## 2014-11-19 DIAGNOSIS — G629 Polyneuropathy, unspecified: Secondary | ICD-10-CM

## 2014-11-19 LAB — EXPECTORATED SPUTUM ASSESSMENT W GRAM STAIN, RFLX TO RESP C

## 2014-11-19 LAB — HIV ANTIBODY (ROUTINE TESTING W REFLEX): HIV Screen 4th Generation wRfx: NONREACTIVE

## 2014-11-19 LAB — STREP PNEUMONIAE URINARY ANTIGEN: Strep Pneumo Urinary Antigen: NEGATIVE

## 2014-11-19 MED ORDER — PIPERACILLIN-TAZOBACTAM 3.375 G IVPB
3.3750 g | Freq: Three times a day (TID) | INTRAVENOUS | Status: DC
  Administered 2014-11-19 – 2014-11-20 (×3): 3.375 g via INTRAVENOUS
  Filled 2014-11-19 (×4): qty 50

## 2014-11-19 MED ORDER — GLUCERNA 1.2 CAL PO LIQD
480.0000 mL | Freq: Three times a day (TID) | ORAL | Status: DC
  Administered 2014-11-19 – 2014-11-21 (×6): 480 mL
  Filled 2014-11-19 (×15): qty 711

## 2014-11-19 MED ORDER — KETOROLAC TROMETHAMINE 15 MG/ML IJ SOLN
15.0000 mg | Freq: Once | INTRAMUSCULAR | Status: AC
  Administered 2014-11-19: 15 mg via INTRAVENOUS
  Filled 2014-11-19: qty 1

## 2014-11-19 MED ORDER — SODIUM CHLORIDE 0.9 % IV BOLUS (SEPSIS)
500.0000 mL | Freq: Once | INTRAVENOUS | Status: AC
  Administered 2014-11-19: 500 mL via INTRAVENOUS

## 2014-11-19 NOTE — Progress Notes (Signed)
Mark [name] GBT:[id number] DOB: Aug 04, 1956 DOA: 11/18/2014 PCP: No primary care provider on file.  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 [employer], Dr. Wandra Arthurs ENT-TOnsillectomy, COPD, Prior episdoes of Aspiration PNA--supposed to be using a feeding tube. Can eat "pleasure foods" [not supposed to drink this liquids], still smoker sinc eage 14- prior 2ppd 1/2ppd [Pulm Dr. 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   Admit c  Probable Aspiration PNA and Acute hypoxic respiratory failure  Past medical history-As per Problem list Chart reviewed as below-   Consultants:    Procedures:    Antibiotics:  Zosyn   Subjective   improved Some discomfort overnight in back but no other issues Tolerating his feeds Nursing held his home dose Oxy IR out of concern for hypotension State sLBP in acutally improved and can sit up today   Objective    Interim History:   Telemetry: Sinus tachy 100's   Objective: Filed Vitals:   11/19/14 0300 11/19/14 0400 11/19/14 0500 11/19/14 0740  BP: 103/66 110/67 86/53   Pulse: 102 114 102   Temp:      TempSrc:      Resp: 18 16 20    Height:      Weight:      SpO2: 86% 97% 93% 93%    Intake/Output Summary (Last 24 hours) at 11/19/14 0750 Last data filed at 11/19/14 0520  Gross per 24 hour  Intake      0 ml  Output   1400 ml  Net  -1400 ml    Exam:  General: eomi, ncat Cardiovascular: s1 s2 no m/r/g Respiratory: clear no added soudn Abdomen: soft, NT, ND Skin no LE edema Neurointact  Data Reviewed: Basic Metabolic Panel:  Recent Labs Lab 11/18/14 1830  NA 134*  K 4.0  CL 99  CO2 29  GLUCOSE 139*  BUN 15  CREATININE 0.73    CALCIUM 7.9*   Liver Function Tests: No results for input(s): AST, ALT, ALKPHOS, BILITOT, PROT, ALBUMIN in the last 168 hours. No results for input(s): LIPASE, AMYLASE in the last 168 hours. No results for input(s): AMMONIA in the last 168 hours. CBC:  Recent Labs Lab 11/18/14 1815  WBC 16.8*  NEUTROABS 14.8*  HGB 10.0*  HCT 32.1*  MCV 95.3  PLT 125*   Cardiac Enzymes: No results for input(s): CKTOTAL, CKMB, CKMBINDEX, TROPONINI in the last 168 hours. BNP: Invalid input(s): POCBNP CBG: No results for input(s): GLUCAP in the last 168 hours.  Recent Results (from the past 240 hour(s))  MRSA PCR Screening     Status: None   Collection Time: 11/18/14  5:13 PM  Result Value Ref Range Status   MRSA by PCR NEGATIVE NEGATIVE Final    Comment:        The GeneXpert MRSA Assay (FDA approved for NASAL specimens only), is one component of a comprehensive MRSA colonization surveillance program. It is not intended to diagnose MRSA infection nor to guide or monitor treatment for MRSA infections.   Culture, sputum-assessment     Status: None   Collection Time: 11/19/14  5:30 AM  Result Value Ref Range Status   Specimen Description SPUTUM  Final   Special  Requests NONE  Final   Sputum evaluation   Final    THIS SPECIMEN IS ACCEPTABLE. RESPIRATORY CULTURE REPORT TO FOLLOW.   Report Status 11/19/2014 FINAL  Final     Studies:              All Imaging reviewed and is as per above notation   Scheduled Meds: . ARIPiprazole  2 mg Oral Daily  . budesonide-formoterol  2 puff Inhalation BID  . fluticasone  2 spray Each Nare Daily  . gabapentin  800 mg Per Tube 3 times per day  . heparin  5,000 Units Subcutaneous 3 times per day  . oxyCODONE  30 mg Per NG tube QID  . pantoprazole sodium  40 mg Per Tube BID  . simvastatin  20 mg Per Tube q1800  . traZODone  150 mg Per Tube QHS  . venlafaxine  37.5 mg Per Tube BID WC   Continuous Infusions: . sodium chloride 100 mL/hr at  11/18/14 1938  . feeding supplement (GLUCERNA 1.2 CAL) 480 mL (11/18/14 2052)     Assessment/Plan: Moderate sepsis 2/2 to Aspiration pneumonia-review of records from [place] [facility] reveals white count of 25, x-ray done there shows left lower lobe pneumonia which is consistent with a pneumonia that the patient was treated for 3/10-3/24 and Levaquin at this time I think it would be reasonable to continue the patient on aspiration coverage with Zosyn monotherapy.  Follow sputum culture/blood cult x 2  Tonsillar cancer, S/P surgery, chemotherapy XRT 2009-followed at [place]-patient will follow-up with his regular oncologist at [facility] Dr. Grayland Ormond   Peripheral neuropathy-unclear etiology, outpatient workup and follow-up   Adult failure to thrive-patient to continue PEG feeds. He usually uses Glucerna.   Recurrent aspiration pneumonia--going for outpatient realistically probably needs to have goals of care or at least and delineation of goals of care as an outpatient. He has had 8-9 episodes of aspiration per his wife and has stage IV tonsillar cancer although this is stable according to what she says.   Memory deficit-wife reports that the deficit came about with the XRT see above discussion  TCP-ubnclear etiology-monitor PLT on Cbc in am  Depression-continue antidepressants aripiprazole 2 daily, Trazadone 150 qhs, effexor 37.5 bid   Code Status: full  Family Communication: none bedside  Disposition Plan: inpatient on SDU-can transfer out when hypotension better   Verneita Griffes, MD  [facility] Pager [phone] 11/19/2014, 7:50 AM    LOS: 1 day

## 2014-11-20 ENCOUNTER — Inpatient Hospital Stay (HOSPITAL_COMMUNITY): Payer: Medicaid Other

## 2014-11-20 LAB — CBC WITH DIFFERENTIAL/PLATELET
Basophils Absolute: 0 10*3/uL (ref 0.0–0.1)
Basophils Relative: 0 % (ref 0–1)
EOS PCT: 1 % (ref 0–5)
Eosinophils Absolute: 0.1 10*3/uL (ref 0.0–0.7)
HEMATOCRIT: 29 % — AB (ref 39.0–52.0)
HEMOGLOBIN: 9.2 g/dL — AB (ref 13.0–17.0)
LYMPHS ABS: 1 10*3/uL (ref 0.7–4.0)
LYMPHS PCT: 10 % — AB (ref 12–46)
MCH: 29.7 pg (ref 26.0–34.0)
MCHC: 31.7 g/dL (ref 30.0–36.0)
MCV: 93.5 fL (ref 78.0–100.0)
MONO ABS: 0.9 10*3/uL (ref 0.1–1.0)
MONOS PCT: 8 % (ref 3–12)
NEUTROS ABS: 8.7 10*3/uL — AB (ref 1.7–7.7)
Neutrophils Relative %: 81 % — ABNORMAL HIGH (ref 43–77)
Platelets: 120 10*3/uL — ABNORMAL LOW (ref 150–400)
RBC: 3.1 MIL/uL — ABNORMAL LOW (ref 4.22–5.81)
RDW: 15.2 % (ref 11.5–15.5)
WBC: 10.8 10*3/uL — AB (ref 4.0–10.5)

## 2014-11-20 LAB — COMPREHENSIVE METABOLIC PANEL
ALT: 13 U/L (ref 0–53)
AST: 13 U/L (ref 0–37)
Albumin: 2.2 g/dL — ABNORMAL LOW (ref 3.5–5.2)
Alkaline Phosphatase: 80 U/L (ref 39–117)
Anion gap: 7 (ref 5–15)
BUN: 9 mg/dL (ref 6–23)
CALCIUM: 7.9 mg/dL — AB (ref 8.4–10.5)
CO2: 28 mmol/L (ref 19–32)
Chloride: 99 mmol/L (ref 96–112)
Creatinine, Ser: 0.67 mg/dL (ref 0.50–1.35)
GFR calc Af Amer: >90 mL/min (ref >90)
GLUCOSE: 255 mg/dL — AB (ref 70–99)
Potassium: 4 mmol/L (ref 3.5–5.1)
SODIUM: 134 mmol/L — AB (ref 135–145)
Total Bilirubin: 0.3 mg/dL (ref 0.3–1.2)
Total Protein: 5.4 g/dL — ABNORMAL LOW (ref 6.0–8.3)

## 2014-11-20 LAB — URINE CULTURE

## 2014-11-20 LAB — LEGIONELLA ANTIGEN, URINE

## 2014-11-20 MED ORDER — PANTOPRAZOLE SODIUM 40 MG PO TBEC: 40.0000 mg | DELAYED_RELEASE_TABLET | Freq: Two times a day (BID) | ORAL | Status: DC

## 2014-11-20 MED ORDER — GABAPENTIN 400 MG PO CAPS
800.0000 mg | ORAL_CAPSULE | Freq: Three times a day (TID) | ORAL | Status: DC
  Administered 2014-11-21: 800 mg via ORAL
  Filled 2014-11-20 (×5): qty 2

## 2014-11-20 MED ORDER — TRAZODONE HCL 150 MG PO TABS
150.0000 mg | ORAL_TABLET | Freq: Every day | ORAL | Status: DC
  Administered 2014-11-20: 150 mg via ORAL
  Filled 2014-11-20 (×2): qty 1

## 2014-11-20 MED ORDER — ASPIRIN EC 81 MG PO TBEC
81.0000 mg | DELAYED_RELEASE_TABLET | Freq: Every day | ORAL | Status: DC
  Administered 2014-11-20 – 2014-11-21 (×2): 81 mg via ORAL
  Filled 2014-11-20 (×2): qty 1

## 2014-11-20 MED ORDER — AMOXICILLIN-POT CLAVULANATE 875-125 MG PO TABS
1.0000 | ORAL_TABLET | Freq: Two times a day (BID) | ORAL | Status: DC
  Administered 2014-11-20 – 2014-11-21 (×3): 1 via ORAL
  Filled 2014-11-20 (×4): qty 1

## 2014-11-20 MED ORDER — AMOXICILLIN-POT CLAVULANATE ER 1000-62.5 MG PO TB12
2.0000 | ORAL_TABLET | Freq: Two times a day (BID) | ORAL | Status: DC
  Filled 2014-11-20 (×2): qty 2

## 2014-11-20 MED ORDER — BUPROPION HCL ER (SR) 150 MG PO TB12
150.0000 mg | ORAL_TABLET | Freq: Two times a day (BID) | ORAL | Status: DC
  Administered 2014-11-20 – 2014-11-21 (×2): 150 mg via ORAL
  Filled 2014-11-20 (×4): qty 1

## 2014-11-20 MED ORDER — PANTOPRAZOLE SODIUM 40 MG PO PACK
40.0000 mg | PACK | Freq: Two times a day (BID) | ORAL | Status: DC
  Administered 2014-11-20 – 2014-11-21 (×2): 40 mg
  Filled 2014-11-20 (×3): qty 20

## 2014-11-20 MED ORDER — VENLAFAXINE HCL 37.5 MG PO TABS
37.5000 mg | ORAL_TABLET | Freq: Two times a day (BID) | ORAL | Status: DC
  Administered 2014-11-20 – 2014-11-21 (×3): 37.5 mg via ORAL
  Filled 2014-11-20 (×4): qty 1

## 2014-11-20 NOTE — Progress Notes (Signed)
Results for YAMEN, CASTROGIOVANNI (MRN [id number]) as of 11/20/2014 08:42  Ref. Range 11/18/2014 18:30 11/20/2014 03:02  Glucose Latest Range: 70-99 mg/dL 139 (H) 255 (H)  Noted that patient has Type 2 diabetes. Takes Onglyza oral medication at home. Recommend checking CBGs every 4 hours or TID when getting  PEG feedings. Start Novolog SENSITIVE correction scale every 4 hours if CBGs continue to be greater than 180 mg/dl. Will continue to follow while in hospital. Harvel Ricks RN BSN CDE

## 2014-11-20 NOTE — Progress Notes (Signed)
Patient is transferred to 5W14 per wheelchair.  Message left in Mrs. Mark [name]'s voicemail.

## 2014-11-20 NOTE — Progress Notes (Signed)
Utilization review completed. Praveen Coia, RN, BSN. 

## 2014-11-20 NOTE — Progress Notes (Signed)
11/20/14  Pharmacy- renal adjustment of antibiotics 1215  Cr 0.67  A/P:  59yo male on Zosyn for Aspiration pneumonia.  WBC is improving and pt is afebrile.  He continues on [place]-4L at this time.  Cr is wnl and Zosyn will not require adjustment of dosing.  1-  Continue ZOsyn 3.375g IV q8 2-  Pharmacy will sign-off, please reconsult as needed.  Gracy Bruins, PharmD Clinical Pharmacist [facility]

## 2014-11-20 NOTE — Progress Notes (Signed)
Mark [name] HYI:[id number] DOB: 07/13/1956 DOA: 11/18/2014 PCP: No primary care provider on file.  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 [employer], Dr. Wandra Arthurs ENT-TOnsillectomy, COPD, Prior episdoes of Aspiration PNA--supposed to be using a feeding tube. Can eat "pleasure foods" [not supposed to drink this liquids], still smoker sinc eage 14- prior 2ppd 1/2ppd [Pulm Dr. 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   Admit c  Probable Aspiration PNA and Acute hypoxic respiratory failure  Past medical history-As per Problem list Chart reviewed as below-   Consultants:    Procedures:    Antibiotics:  Zosyn admission--11/20/14  Augmentin 11/20/14-->   Subjective    Much improved, No fever Tolerating feeds No nausea no vomiting Passing stool No rash No dizziness Wife not at bedside but I spoke to her on the telephone Feels as if he can go home   Objective    Interim History:   Telemetry: Sinus tachy 100's   Objective: Filed Vitals:   11/20/14 0310 11/20/14 0635 11/20/14 0756 11/20/14 0800  BP: 108/71 125/77 95/63 105/66  Pulse: 107 97  85  Temp: 97.7 F (36.5 C)  98.2 F (36.8 C)   TempSrc: Oral  Oral   Resp: 19 23  11   Height:      Weight:      SpO2: 94% 93%  98%    Intake/Output Summary (Last 24 hours) at 11/20/14 1110 Last data filed at 11/20/14 1055  Gross per 24 hour  Intake   3324 ml  Output   3020 ml  Net    304 ml    Exam:  General: eomi, ncat Cardiovascular: s1 s2 no m/r/g-telemetry within normal limits Respiratory: clear no added soudn Abdomen: soft, NT, ND--PEG tube appears clean around dressing Skin no LE edema Neurointact  Data  Reviewed: Basic Metabolic Panel:  Recent Labs Lab 11/18/14 1830 11/20/14 0302  NA 134* 134*  K 4.0 4.0  CL 99 99  CO2 29 28  GLUCOSE 139* 255*  BUN 15 9  CREATININE 0.73 0.67  CALCIUM 7.9* 7.9*   Liver Function Tests:  Recent Labs Lab 11/20/14 0302  AST 13  ALT 13  ALKPHOS 80  BILITOT 0.3  PROT 5.4*  ALBUMIN 2.2*   No results for input(s): LIPASE, AMYLASE in the last 168 hours. No results for input(s): AMMONIA in the last 168 hours. CBC:  Recent Labs Lab 11/18/14 1815 11/20/14 0302  WBC 16.8* 10.8*  NEUTROABS 14.8* 8.7*  HGB 10.0* 9.2*  HCT 32.1* 29.0*  MCV 95.3 93.5  PLT 125* 120*   Cardiac Enzymes: No results for input(s): CKTOTAL, CKMB, CKMBINDEX, TROPONINI in the last 168 hours. BNP: Invalid input(s): POCBNP CBG: No results for input(s): GLUCAP in the last 168 hours.  Recent Results (from the past 240 hour(s))  MRSA PCR Screening     Status: None   Collection Time: 11/18/14  5:13 PM  Result Value Ref Range Status   MRSA by PCR NEGATIVE NEGATIVE Final    Comment:        The GeneXpert MRSA Assay (FDA approved for NASAL specimens only), is one component of a comprehensive MRSA colonization surveillance program. It is not intended to  diagnose MRSA infection nor to guide or monitor treatment for MRSA infections.   Culture, blood (routine x 2) Call MD if unable to obtain prior to antibiotics being given     Status: None (Preliminary result)   Collection Time: 11/18/14  6:15 PM  Result Value Ref Range Status   Specimen Description BLOOD LEFT ARM  Final   Special Requests BOTTLES DRAWN AEROBIC AND ANAEROBIC 5CC EACH  Final   Culture   Final           BLOOD CULTURE RECEIVED NO GROWTH TO DATE CULTURE WILL BE HELD FOR 5 DAYS BEFORE ISSUING A FINAL NEGATIVE REPORT Performed at [facility]    Report Status PENDING  Incomplete  Culture, blood (routine x 2) Call MD if unable to obtain prior to antibiotics being given     Status: None  (Preliminary result)   Collection Time: 11/18/14  6:30 PM  Result Value Ref Range Status   Specimen Description BLOOD LEFT HAND  Final   Special Requests BOTTLES DRAWN AEROBIC AND ANAEROBIC 5CC EACH  Final   Culture   Final           BLOOD CULTURE RECEIVED NO GROWTH TO DATE CULTURE WILL BE HELD FOR 5 DAYS BEFORE ISSUING A FINAL NEGATIVE REPORT Performed at [facility]    Report Status PENDING  Incomplete  Culture, sputum-assessment     Status: None   Collection Time: 11/19/14  5:30 AM  Result Value Ref Range Status   Specimen Description SPUTUM  Final   Special Requests NONE  Final   Sputum evaluation   Final    THIS SPECIMEN IS ACCEPTABLE. RESPIRATORY CULTURE REPORT TO FOLLOW.   Report Status 11/19/2014 FINAL  Final     Studies:              All Imaging reviewed and is as per above notation   Scheduled Meds: . amoxicillin-clavulanate  1 tablet Oral Q12H  . ARIPiprazole  2 mg Oral Daily  . budesonide-formoterol  2 puff Inhalation BID  . feeding supplement (GLUCERNA 1.2 CAL)  480 mL Per Tube TID AC & HS  . fluticasone  2 spray Each Nare Daily  . gabapentin  800 mg Per Tube 3 times per day  . heparin  5,000 Units Subcutaneous 3 times per day  . oxyCODONE  30 mg Per NG tube QID  . pantoprazole sodium  40 mg Per Tube BID  . simvastatin  20 mg Per Tube q1800  . traZODone  150 mg Per Tube QHS  . venlafaxine  37.5 mg Per Tube BID WC   Continuous Infusions: . sodium chloride 1,000 mL (11/20/14 0751)     Assessment/Plan: Moderate sepsis 2/2 to Aspiration pneumonia- eview of records from [facility] reveals white count of 25, x-ray done there shows left lower lobe pneumonia which is consistent with a pneumonia that the patient was treated for 3/10-3/24 and Levaquin at this time I think it would be reasonable to continue the patient on aspiration coverage with Zosyn monotherapy.   Zosyn 4/4--Augmentin discontinuation Date = 12/01/12 for full 14 day therapy. Strep  pneumo, Legionella negative Await sputum culture/blood cult x 2 Follow a.m. chest x-ray  Tonsillar cancer, S/P surgery, chemotherapy XRT 2009-followed at [place]-patient will follow-up with his regular oncologist at [facility] Dr. Grayland Ormond   Peripheral neuropathy-unclear etiology, outpatient workup and follow-up--could be from XRT versus chemotherapy that patient received for his cancer   Adult failure to thrive-patient to continue PEG  feeds. He usually uses Glucerna. We will place the patient on sliding scale coverage for TPN/feeding induced hyperglycemia  Obtain A1c in a.m.   Recurrent aspiration pneumonia--going for outpatient realistically probably needs to have goals of care or at least and delineation of goals of care as an outpatient. He has had 8-9 episodes of aspiration per his wife and has stage IV tonsillar cancer although this is stable according to what she says.   Memory deficit-wife reports that the deficit came about with the XRT see above discussion  Mild dilutional pancytopenia secondary to IV saline -note drop in all cell lines, white count, CBC, platelets. He is on high months of IV saline which may have diluted his blood Cbc in am  Depression-continue antidepressants aripiprazole 2 daily, Trazadone 150 qhs, effexor 37.5 bid Would recommend down titration/his continuation if possible we'll off of Klonopin 1 mg twice a day as can be habit forming-this will need to be done as an outpatient.  Presumed COPD-former smoker. Continue Symbicort 2 puffs 3 times a day. Outpatient follow-up   Code Status: full  Family Communication: Discussed in detail with wife on telephone. Disposition Plan: Step down-->telemetry today--? DC home 4/5   Verneita Griffes, MD  [facility] Pager [phone] 11/20/2014, 11:10 AM    LOS: 2 days

## 2014-11-21 LAB — COMPREHENSIVE METABOLIC PANEL WITH GFR
ALT: 15 U/L (ref 0–53)
AST: 15 U/L (ref 0–37)
Albumin: 2.6 g/dL — ABNORMAL LOW (ref 3.5–5.2)
Alkaline Phosphatase: 76 U/L (ref 39–117)
Anion gap: 5 (ref 5–15)
BUN: 7 mg/dL (ref 6–23)
CO2: 36 mmol/L — ABNORMAL HIGH (ref 19–32)
Calcium: 9 mg/dL (ref 8.4–10.5)
Chloride: 97 mmol/L (ref 96–112)
Creatinine, Ser: 0.75 mg/dL (ref 0.50–1.35)
GFR calc Af Amer: >90 mL/min
GFR calc non Af Amer: >90 mL/min
Glucose, Bld: 232 mg/dL — ABNORMAL HIGH (ref 70–99)
Potassium: 4.1 mmol/L (ref 3.5–5.1)
Sodium: 138 mmol/L (ref 135–145)
Total Bilirubin: 0.5 mg/dL (ref 0.3–1.2)
Total Protein: 6.4 g/dL (ref 6.0–8.3)

## 2014-11-21 LAB — CULTURE, RESPIRATORY W GRAM STAIN: Culture: NORMAL

## 2014-11-21 LAB — CBC
HCT: 33.4 % — ABNORMAL LOW (ref 39.0–52.0)
Hemoglobin: 10.5 g/dL — ABNORMAL LOW (ref 13.0–17.0)
MCH: 29.2 pg (ref 26.0–34.0)
MCHC: 31.4 g/dL (ref 30.0–36.0)
MCV: 93 fL (ref 78.0–100.0)
Platelets: 138 10*3/uL — ABNORMAL LOW (ref 150–400)
RBC: 3.59 MIL/uL — AB (ref 4.22–5.81)
RDW: 14.4 % (ref 11.5–15.5)
WBC: 8.8 10*3/uL (ref 4.0–10.5)

## 2014-11-21 MED ORDER — AMOXICILLIN-POT CLAVULANATE 875-125 MG PO TABS: 1.0000 | ORAL_TABLET | Freq: Two times a day (BID) | ORAL | Status: DC

## 2014-11-21 NOTE — Progress Notes (Signed)
Patient discharge teaching given, including activity, diet, follow-up appoints, and medications. Patient verbalized understanding of all discharge instructions. IV access was d/c'd. Vitals are stable. Skin is intact except as charted in most recent assessments. Pt to be escorted out by NT, to be driven home by family. 

## 2014-11-21 NOTE — Discharge Summary (Signed)
Physician Discharge Summary  Mark [name] GBT:[id number] DOB: 1956/01/04 DOA: 11/18/2014  PCP: No primary care provider on file.  Admit date: 11/18/2014 Discharge date: 11/21/2014  Time spent: 40 minutes  Recommendations for Outpatient Follow-up:  1. Patient complete full course of treatment for aspiration with Augmentin 2. Follow-up with ENT, oncology, PCP as needed  3. Will need further outpatient management and goals of care 4. Outpatient follow-up chest x-ray needed in 4 weeks as  had bilateral infiltrates 5. Consider outpatient HbA1c as sugars were slightly elevated this admission   Discharge Diagnoses:  Active Problems:   Aspiration pneumonia   Tonsillar cancer, S/P surgery, chemotherapy XRT 2009-followed at [place]   Peripheral neuropathy   Adult failure to thrive   Recurrent aspiration pneumonia   Memory deficit   PEG tube    Discharge Condition: Fair  Diet recommendation: Nothing by mouth, continue tube feeds as per her usual regimen  Filed Weights   11/18/14 1716  Weight: 66.9 kg (147 lb 7.8 oz)    History of present illness:  59 y/o ?, h/o tonsillar CA diag 07/2008, RX Chemo-XRT-Dr. Finnigan, Dr. Wandra Arthurs ENT-TOnsillectomy, COPD, Prior episodes Aspiration PNA--supposed to be using a feeding tube. Can eat "pleasure foods" [not supposed to drink this liquids], still smoker since age 18- prior 2ppd 1/2ppd [Pulm Dr. 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   Admit c Probable Aspiration PNA and Acute hypoxic respiratory failure  Hospital Course:   Moderate sepsis 2/2 to Aspiration pneumonia- eview of records from [facility] reveals white count of 25, x-ray done there shows left lower lobe pneumonia which is  consistent with a pneumonia that the patient was treated for 3/10-3/24 and Levaquin  Zosyn monotherapy.   Zosyn 4/4--Augmentin discontinuation Date = 12/01/12 for full 14 day therapy. Strep pneumo, Legionella negative Till date sputum culture/blood cult x 2 were negative Follow please follow-up outpatient x-ray in about or weeks to denote clearing of bilateral infiltrates.  Tonsillar cancer, S/P surgery, chemotherapy XRT 2009-followed at [place]-patient will follow-up with his regular oncologist at [facility] Dr. Grayland Ormond   Peripheral neuropathy-unclear etiology, outpatient workup and follow-up--could be from XRT versus chemotherapy that patient received for his cancer   Adult failure to thrive-patient to continue PEG feeds. He usually uses Glucerna. Patient was on sliding scale coverage for TPN/feeding induced hyperglycemia     Recurrent aspiration pneumonia--going for outpatient realistically probably needs to have goals of care or at least and delineation of goals of care as an outpatient. He has had 8-9 episodes of aspiration per his wife and has stage IV tonsillar cancer although this is stable according to what she says.   Memory deficit-wife reports that the deficit came about with the XRT see above discussion  Mild dilutional pancytopenia secondary to IV saline -note drop in all cell lines, white count, CBC, platelets. Initially on IV saline which may have diluted his blood Cbc in am  Depression-continue antidepressants aripiprazole 2 daily, Trazadone 150 qhs, effexor 37.5 bid Would recommend down titration/his continuation if possible we'll off of Klonopin 1 mg twice a day as can be habit forming-this will need to be done as an outpatient.  Presumed COPD-former smoker. Continue Symbicort 2 puffs 3 times a day. Outpatient  follow-up  Antibiotics:  Zosyn admission--11/20/14  Augmentin 11/20/14--> 12/02/14  Discharge Exam: Filed Vitals:   11/21/14 0607  BP: 111/74  Pulse: 88    Temp: 98.3 F (36.8 C)  Resp: 24    General: Alert pleasant oriented no apparent distress Cardiovascular: S1-S2 no murmur rub or gallop Respiratory: Clinically clear  Discharge Instructions   Discharge Instructions    Diet - low sodium heart healthy    Complete by:  As directed      Discharge instructions    Complete by:  As directed   U had aspiration Pnuemonia Nothing by mouth! See your regular ENT/Oncologist in [place] Please complete Augmentin 12/01/12--complete full course.     Increase activity slowly    Complete by:  As directed           Current Discharge Medication List    START taking these medications   Details  amoxicillin-clavulanate (AUGMENTIN) 875-125 MG per tablet Take 1 tablet by mouth every 12 (twelve) hours. Qty: 20 tablet, Refills: 0      CONTINUE these medications which have NOT CHANGED   Details  albuterol (PROVENTIL HFA;VENTOLIN HFA) 108 (90 BASE) MCG/ACT inhaler Inhale 2 puffs into the lungs every 6 (six) hours as needed for wheezing or shortness of breath. ProAir    ARIPiprazole (ABILIFY) 2 MG tablet 2 mg by PEG Tube route daily.     aspirin EC 81 MG tablet 81 mg by PEG Tube route daily.    budesonide-formoterol (SYMBICORT) 160-4.5 MCG/ACT inhaler Inhale 2 puffs into the lungs 2 (two) times daily.    buPROPion (WELLBUTRIN SR) 150 MG 12 hr tablet 150 mg by PEG Tube route 2 (two) times daily.    clonazePAM (KLONOPIN) 1 MG tablet 1 mg by PEG Tube route 3 (three) times daily as needed for anxiety.     gabapentin (NEURONTIN) 400 MG capsule 800 mg by PEG Tube route 3 (three) times daily.    Ipratropium-Albuterol (COMBIVENT) 20-100 MCG/ACT AERS respimat Inhale 2 puffs into the lungs 2 (two) times daily.    mometasone (NASONEX) 50 MCG/ACT nasal spray Place 2 sprays into both nostrils 2 (two) times daily.    Nutritional Supplements (GLUCERNA 1.5 CAL PO) 237-474 mLs by PEG Tube route See admin instructions. Take 2 cans (474 mls) as a meal  substitute breakfast, lunch and dinner; take 1 can (237 mls) as a snack mid afternoon    oxycodone (ROXICODONE) 30 MG immediate release tablet 30 mg by PEG Tube route 4 (four) times daily.    pantoprazole (PROTONIX) 40 MG tablet 40 mg by PEG Tube route 2 (two) times daily.    simvastatin (ZOCOR) 20 MG tablet 20 mg by PEG Tube route at bedtime.     traZODone (DESYREL) 150 MG tablet 150 mg by PEG Tube route at bedtime.    venlafaxine (EFFEXOR) 37.5 MG tablet 37.5 mg by PEG Tube route 2 (two) times daily.      STOP taking these medications     levofloxacin (LEVAQUIN) 500 MG tablet        Allergies  Allergen Reactions  . Morphine And Related Nausea And Vomiting      The results of significant diagnostics from this hospitalization (including imaging, microbiology, ancillary and laboratory) are listed below for reference.    Significant Diagnostic Studies: Dg Chest 2 View  11/20/2014   CLINICAL DATA:  Shortness of Breath, followup pneumonia  EXAM: CHEST  2 VIEW  COMPARISON:  11/18/2014  FINDINGS: Cardiac shadow is stable. The  lungs are well aerated bilaterally. Increasing infiltrate is noted in the left lung base. Additionally right middle lobe infiltrate is noted. No acute bony abnormality is seen.  IMPRESSION: Progressive infiltrates bilaterally continued followup is recommended.   Electronically Signed   By: Inez Catalina M.D.   On: 11/20/2014 12:14    Microbiology: Recent Results (from the past 240 hour(s))  MRSA PCR Screening     Status: None   Collection Time: 11/18/14  5:13 PM  Result Value Ref Range Status   MRSA by PCR NEGATIVE NEGATIVE Final    Comment:        The GeneXpert MRSA Assay (FDA approved for NASAL specimens only), is one component of a comprehensive MRSA colonization surveillance program. It is not intended to diagnose MRSA infection nor to guide or monitor treatment for MRSA infections.   Culture, blood (routine x 2) Call MD if unable to obtain prior to  antibiotics being given     Status: None (Preliminary result)   Collection Time: 11/18/14  6:15 PM  Result Value Ref Range Status   Specimen Description BLOOD LEFT ARM  Final   Special Requests BOTTLES DRAWN AEROBIC AND ANAEROBIC 5CC EACH  Final   Culture   Final           BLOOD CULTURE RECEIVED NO GROWTH TO DATE CULTURE WILL BE HELD FOR 5 DAYS BEFORE ISSUING A FINAL NEGATIVE REPORT Performed at [facility]    Report Status PENDING  Incomplete  Culture, blood (routine x 2) Call MD if unable to obtain prior to antibiotics being given     Status: None (Preliminary result)   Collection Time: 11/18/14  6:30 PM  Result Value Ref Range Status   Specimen Description BLOOD LEFT HAND  Final   Special Requests BOTTLES DRAWN AEROBIC AND ANAEROBIC 5CC EACH  Final   Culture   Final           BLOOD CULTURE RECEIVED NO GROWTH TO DATE CULTURE WILL BE HELD FOR 5 DAYS BEFORE ISSUING A FINAL NEGATIVE REPORT Performed at [facility]    Report Status PENDING  Incomplete  Culture, sputum-assessment     Status: None   Collection Time: 11/19/14  5:30 AM  Result Value Ref Range Status   Specimen Description SPUTUM  Final   Special Requests NONE  Final   Sputum evaluation   Final    THIS SPECIMEN IS ACCEPTABLE. RESPIRATORY CULTURE REPORT TO FOLLOW.   Report Status 11/19/2014 FINAL  Final  Culture, respiratory (NON-Expectorated)     Status: None   Collection Time: 11/19/14  5:30 AM  Result Value Ref Range Status   Specimen Description SPUTUM  Final   Special Requests NONE  Final   Gram Stain   Final    FEW WBC PRESENT,BOTH PMN AND MONONUCLEAR MODERATE SQUAMOUS EPITHELIAL CELLS PRESENT FEW GRAM POSITIVE COCCI IN CLUSTERS FEW GRAM POSITIVE RODS Performed at [facility]    Culture   Final    NORMAL OROPHARYNGEAL FLORA Performed at [facility]    Report Status 11/21/2014 FINAL  Final     Labs: Basic Metabolic Panel:  Recent Labs Lab 11/18/14 1830  11/20/14 0302 11/21/14 0800  NA 134* 134* 138  K 4.0 4.0 4.1  CL 99 99 97  CO2 29 28 36*  GLUCOSE 139* 255* 232*  BUN 15 9 7   CREATININE 0.73 0.67 0.75  CALCIUM 7.9* 7.9* 9.0   Liver Function Tests:  Recent Labs Lab 11/20/14 0302  11/21/14 0800  AST 13 15  ALT 13 15  ALKPHOS 80 76  BILITOT 0.3 0.5  PROT 5.4* 6.4  ALBUMIN 2.2* 2.6*   No results for input(s): LIPASE, AMYLASE in the last 168 hours. No results for input(s): AMMONIA in the last 168 hours. CBC:  Recent Labs Lab 11/18/14 1815 11/20/14 0302 11/21/14 0800  WBC 16.8* 10.8* 8.8  NEUTROABS 14.8* 8.7*  --   HGB 10.0* 9.2* 10.5*  HCT 32.1* 29.0* 33.4*  MCV 95.3 93.5 93.0  PLT 125* 120* 138*   Cardiac Enzymes: No results for input(s): CKTOTAL, CKMB, CKMBINDEX, TROPONINI in the last 168 hours. BNP: BNP (last 3 results) No results for input(s): BNP in the last 8760 hours.  ProBNP (last 3 results) No results for input(s): PROBNP in the last 8760 hours.  CBG: No results for input(s): GLUCAP in the last 168 hours.     SignedNita Sells  [facility] 11/21/2014, 9:39 AM

## 2014-11-23 LAB — CULTURE, BLOOD (SINGLE)

## 2014-11-25 LAB — CULTURE, BLOOD (ROUTINE X 2)
CULTURE: NO GROWTH
Culture: NO GROWTH

## 2014-12-05 NOTE — Discharge Summary (Signed)
PATIENT NAME:  Mark [name], Mark [name] MR#:  [id number] DATE OF BIRTH:  08/22/1955  DATE OF ADMISSION:  06/21/2012 DATE OF DISCHARGE:  06/23/2012  ADMITTING DIAGNOSIS: Systemic inflammatory response reaction.   DISCHARGE DIAGNOSES:  1. Systemic inflammatory response reaction due to bacterial pneumonia, questionable aspiration pneumonia.  2. Acute respiratory failure due to pneumonia, resolved.  3. Dysphagia, chronic, due to history of tonsillar carcinoma.  4. Dehydration.  5. Hyponatremia, resolved on intravenous fluids.  6. Metabolic encephalopathy, resolved.  7. Elevated transaminases, resolved, unclear etiology. 8. History of H. pylori infection.  9. Diabetes mellitus.  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  11. Chronic obstructive pulmonary disease.  12. Ongoing tobacco abuse.  13. Gastroesophageal reflux disease.   DISCHARGE CONDITION: Stable.   DISCHARGE MEDICATIONS:  The patient is to resume his outpatient medications which are:  1. Clonazepam 1 mg 3 times daily.  2. Abilify 2 mg p.o. once daily.  3. Metformin 500 mg p.o. daily.  4. Gabapentin 800 mg p.o. 4 times daily.  5. Sucralfate 1 gram p.o. twice daily.  6. Clarithromycin 500 mg p.o. twice daily for 14 days.  7. Amoxicillin 500 mg p.o. 2 capsules twice daily for 14 days. Those two antibiotics as previously scheduled for peptic ulcer disease H. pylori infection.  8. Omeprazole 40 mg p.o. twice daily.  9. Trazodone 150 mg p.o. at bedtime.  10. Oxycodone 30 mg p.o. every six hours. New medications: 1. Prednisone 10-mg tablet, 5 tablets on 06/24/2012, then taper by 10 mg daily until stopped.  2. Albuterol ipratropium CFC-free inhalation aerosol 1 puff 4 times daily.  3. Fluticasone/ salmeterol 250/50, 1 puff twice daily.  4. Guaifenesin 600 mg p.o. 1 tablet twice daily.  5. Levofloxacin 750 mg p.o. daily for five more days. The patient is to stop and not to use any nonsteroidal anti-inflammatory medications including etodolac.     DIET:  2 grams salt, low fat, low cholesterol, carbohydrate controlled diet, regular consistency.   ACTIVITY LIMITATIONS: As tolerated.     FOLLOWUP: Follow-up appointment with primary care physician in [facility] two days after discharge.   CONSULTANTS:  Care management.   RADIOLOGIC STUDIES:  1. Chest x-ray, portable single view, 06/21/2012 showed infiltrate involving the lingula and right middle lobe region. There is also a component of right and left lower lobe involvement. Surveillance evaluation is recommended. Findings were greater on the right than on the left. 2. Repeat chest x-ray PA and lateral 06/23/2012 showed improved aeration in lung bases. Residual infiltrate is present.   HISTORY OF PRESENT ILLNESS: The patient is a 59 year old Caucasian male with history of tonsillar carcinoma who has history of chronic dysphagia and presented to the hospital with family's complaints of confusion, lethargy, and high-grade fevers for a few days before coming to the hospital. Please refer to Dr. Serita Grit admission note on 06/21/2012. On arrival to the hospital the patient's temperature was 101.8, pulse 112, tachycardic, respiratory rate 20, blood pressure 150/70, saturation was 97% on BiPAP. Physical exam revealed decreased breath sounds at bases, distant breath sounds, and a few bibasilar crackles.  Laboratory data revealed sodium level at 131, glucose was elevated at 177. BMP was unremarkable otherwise. Albumin level was 2.9, alkaline phosphatase 138, AST was slightly elevated at 39, otherwise liver enzymes were unremarkable. Cardiac enzymes were also normal. White blood cell count was high at 15.6, hemoglobin 10, platelet count 222. Blood cultures times two did not show any growth taken on 06/21/2012.   HOSPITAL COURSE: The patient was admitted  to the hospital. He was started on antibiotic therapy, broad-spectrum antibiotics to cover for atypical pneumonia as well as possible aspiration pneumonitis. The  patient was evaluated by speech therapist who felt that the patient would benefit from outpatient speech therapy and followup. Over his stay in the hospital he significantly improved. His oxygenation improved and he is back on room air. His oxygen saturations are 94% on room air at rest. They are somewhat a little lower when he exerts himself but do not go down significantly; it is around 91% on room air on exertion according to the social worker and the nursing staff who discussed the case with me. In regards to his pneumonia, the patient is to continue antibiotic therapy for five more days to complete the course. His chest x-ray was repeated and it seems to be improving.  For dysphagia, as mentioned above, it is chronic. The patient should to follow up with outpatient speech therapy.  For dehydration the patient received IV fluids and his hyponatremia which was on admission 131, resolved to 142 on 06/22/2012.   In regards to metabolic encephalopathy, the patient's metabolic encephalopathy was very likely related to hypoxia as well as possibly dehydration. However, as the patient was rehydrated and as  his oxygenation improved, his mental status returned back to normal. On the day of discharge  he is communicative.  He is alert,  does not complain of any significant discomfort and he is being discharged in stable condition with the above-mentioned medications and followup.   Vital signs on the day of discharge: Temperature 97.7, pulse 96, respiration rate 18, blood pressure 128/72, saturation 94% on room air at rest and 91 on exertion.   For history of  H. pylori infection, gastroesophageal reflux disease, and peptic ulcer disease, the patient is to continue his outpatient medications.   For diabetes mellitus he is to continue his diabetic diet.   For chronic obstructive pulmonary disease and ongoing tobacco abuse, the patient was counseled.  He is to follow up with his primary care physician in the  next few days after discharge. He is to continue steroid tapering as well as inhalation therapy for chronic obstructive pulmonary disease. He did not require any oxygen as mentioned above. He is being discharged today on 06/23/2012 in stable condition with the above-mentioned medications and followup.    TIME SPENT: 40 minutes.   ____________________________ Theodoro Grist, MD rv:bjt D: 06/23/2012 14:06:59 ET T: 06/24/2012 11:07:02 ET JOB#: [id number]  cc: Theodoro Grist, MD, <Dictator> Aneyah Lortz MD ELECTRONICALLY SIGNED 07/02/2012 13:01

## 2014-12-05 NOTE — H&P (Signed)
PATIENT NAME:  Mark [name], Mark [name] MR#:  [id number] DATE OF BIRTH:  October 28, 1955  DATE OF ADMISSION:  06/21/2012  PRIMARY CARE PHYSICIAN: Primary care physician is in [place]: Confusion, lethargy and high-grade fever for two days.   HISTORY OF PRESENT ILLNESS: Mr. Brosh is a 59 year old Caucasian gentleman with history of tonsillar carcinoma, type 2 diabetes and history of chronic obstructive pulmonary disease with ongoing tobacco abuse comes to the Emergency Room with above-mentioned chief complaint. Patient had a recent EGD and colonoscopy done about two weeks ago and for the last several days patient's wife has noted that he has been increasingly confused, more so since Saturday when he had a high-grade fever of 102.8. He had been having some cough, however, started noticing he is getting more lethargic, confused and not his self. Brought him to the Emergency Room, had a fever of 101.8. He was hypoxic with sats in the upper 80s and 90s on Ventimask. He is currently on BiPAP, sats are around 98%. He is tachycardic with heart rate in the 110s. Workup in the Emergency Room revealed right lower lobe pneumonia. There is a question if patient could have aspirated given his history of tonsillar carcinoma and radiation with most recent endoscopy done.   Patient also has started taking clarithromycin, amoxicillin and omeprazole for H. pylori infection, this is his second day. He has a total of 14 days of treatment to be taken. Patient is being admitted for systemic inflammatory response syndrome with pneumonia.   PAST MEDICAL HISTORY:  1. Tonsillar carcinoma.  2. Type 2 diabetes.  3. Right knee surgery.  4. Chronic obstructive pulmonary disease with ongoing tobacco abuse.  5. Gastroesophageal reflux disease with peptic ulcer disease with H. pylori detected on endoscopy that was done about two weeks ago.  6. Colonoscopy done two weeks ago in [facility] showed one polyp.   CURRENT  MEDICATIONS:  1. Trazodone 150 mg at bedtime.  2. Sucralfate 1 gram b.i.d.  3. ProAir HFA 1 puff as needed.  4. Oxycodone 30 mg 4 times a day.  5. Omeprazole 40 mg b.i.d.  6. Metformin 500 mg p.o. daily.  7. Gabapentin 800 mg 4 times a day.  8. Etodolac 400 mg once a day.  9. Clonazepam 1 mg 3 times a day.  10. Clarithromycin 500 mg twice a day for 14 days.  11. Amoxicillin 500 mg 2 capsules twice a day for 14 days.  12. Abilify 2 mg tablet p.o. daily.   ALLERGIES: Morphine.   FAMILY HISTORY: Father is living and healthy.    REVIEW OF SYSTEMS: CONSTITUTIONAL: Positive for fever, fatigue, weakness. EYES: No blurred or double vision. No glaucoma. ENT: No tinnitus, ear pain, hearing loss. RESPIRATORY: Positive for cough, wheeze and shortness of breath. CARDIOVASCULAR: No chest pain, orthopnea, edema. GASTROINTESTINAL: No nausea, vomiting, diarrhea, abdominal pain. Positive for gastroesophageal reflux disease. GENITOURINARY: No dysuria, hematuria. ENDOCRINE: No polyuria, nocturia, or thyroid problems. HEMATOLOGY: No anemia or easy bruising. SKIN: No acne, rash. MUSCULOSKELETAL: Positive for arthritis. NEUROLOGIC: No cerebrovascular accident, transient ischemic attack. PSYCH: No anxiety, depression. All other systems reviewed and negative.   PHYSICAL EXAMINATION:  GENERAL: Patient is awake. He is alert. He is oriented x2. He is somewhat confused.   VITAL SIGNS: Temperature 101.8, pulse 112, tachycardic, respirations 20, blood pressure 150/70, sats 97% on current BiPAP setting.   HEENT: Atraumatic, normocephalic. Pupils are equal, round, and reactive to light and accommodation. Extraocular movements intact. Oral mucosa  is dry.   NECK: Supple. No JVD. No carotid bruit.   RESPIRATORY: There are decreased breath sounds in the bases, distant breath sounds. No use of accessory muscles. Patient is currently on BiPAP. There are a few bibasilar crackles heard.   CARDIOVASCULAR: Tachycardia present.  No murmur heard. PMI not lateralized. Chest nontender.   EXTREMITIES: Good pedal pulses, good femoral pulses. No lower extremity edema.   ABDOMEN: Soft, benign, nontender. No organomegaly. Positive bowel sounds.    NEUROLOGICAL: Patient is somewhat confused, lethargic. He follows verbal commands, moves all of his extremities well.   LABORATORY, DIAGNOSTIC AND RADIOLOGICAL DATA: Chest x-ray shows infiltrate involving the lingula and right middle lobe region. There is a component of right and lower lobe involvement.   pH 7.41, pCO2 42, pO2 45 with FiO2 32, saturations 81%. Lactic acid 1.5. White count 15.6, hemoglobin and hematocrit 10 and 30.5, platelet count 222. Glucose 177, BUN 8, creatinine 1.1, sodium 131, potassium 3.8, chloride 96, bicarbonate 24, calcium 8.2, alkaline phosphatase 138, SGOT 39, albumin 2.9, troponin less than 0.02.   ASSESSMENT AND PLAN: 59 year old Mr. Valley with history of diabetes, chronic obstructive pulmonary disease, ongoing tobacco abuse comes in with:  1. SIRS due to right middle lobe, lower lobe pneumonia and possible left lower lobe pneumonia. Patient will be admitted on medical floor with telemetry. Will use BiPAP as needed and high flow nasal cannula oxygen to keep sats greater than 92%. Will start patient on Zosyn. He received a dose of Rocephin and Zithromax here in the Emergency Room. Patient also is on clarithromycin and amoxicillin as part of his H. pylori treatment hence will continue that. Will also start patient on Solu-Medrol nebs and p.r.n. BiPAP. Follow blood cultures, sputum cultures.  2. Acute on chronic chronic obstructive pulmonary disease flare in the setting of pneumonia. Will continue IV steroids, oxygen, nebs, inhalers and taper oxygen as indicated.  3. Early dementia with memory loss. Patient is on Abilify.  4. Type 2 diabetes. Will continue metformin and sliding scale.  5. Gastroesophageal reflux disease with peptic ulcer disease with positive  H. pylori. Will continue patient's treatment with clarithromycin, amoxicillin and omeprazole along with sucralfate.   6. Tobacco abuse. Patient not motivated on cessation. Counseling done for more than three minutes.  7. DVT prophylaxis with sub-Q heparin.  8. Further work-up according to patient's clinical course. Hospital admission plan was discussed with the patient, patient's wife and patient's son.   CRITICAL TIME SPENT: 60 minutes.   ____________________________ Hart Rochester Posey Pronto, MD sap:cms D: 06/21/2012 16:34:47 ET T: 06/21/2012 17:17:49 ET JOB#: [id number]  cc: Zayven Powe A. Posey Pronto, MD, <Dictator> Ilda Basset MD ELECTRONICALLY SIGNED 06/22/2012 14:11

## 2014-12-05 NOTE — Op Note (Signed)
PATIENT NAME:  Mark [name], Mark [name] MR#:  [id number] DATE OF BIRTH:  [birth year]  DATE OF PROCEDURE:  07/26/2012  PREOPERATIVE DIAGNOSIS: Esophageal stricture.  POSTOPERATIVE DIAGNOSIS: Esophageal stricture.   PROCEDURE: Esophagoscopy and esophageal dilation.   SURGEON: Huey Romans, MD   ANESTHESIA: General.   COMPLICATIONS: None.  TOTAL ESTIMATED BLOOD LOSS: None.   PROCEDURE: The patient was given general anesthesia by oral endotracheal intubation. He was placed in the supine position. His neck was extended some. He was edentulous. A pediatric esophagoscope was used for visualizing the upper esophageal sphincter first. This was fairly tight. The mucosa was very healthy here and there was a little bit of frothy white mucus but no sign of any mucosal lesions anywhere. There were no masses or obvious obstruction; it was just a little bit tight. The scope was passed under direct vision through the upper esophageal sphincter and then into the esophagus, passed very easily down to the level of the GE junction. There was a little bit of frothy white saliva that was in the esophagus that I suctioned a couple of times but the mucosa looked very healthy throughout. There was no sign of any deviation or obstruction anywhere through the esophagus. The GE junction looked pretty normal, although it did not get into the stomach. The mucosa was looked at again very carefully circumferentially on the way out. There was no sign of any mucosal lesions anywhere including the upper esophageal sphincter.   Once the scope was removed, the Maloney bougie dilators were used for dilating the esophagus. I started at 24 [place] as this was about the size of the endoscope. This was passed easily down into the larynx. This was allowed to sit for 5 to 10 seconds and then it was removed. The dilators were placed going up by 2 [place] each time. It went from 36 all the way up to 25 [place]. There was no bleeding noted at any time  through the entire procedure. The patient tolerated the procedure well. He was awakened and taken to the recovery room in satisfactory condition. There were no operative complications.   ____________________________ Huey Romans, MD phj:drc D: 07/26/2012 10:43:00 ET T: 07/26/2012 10:54:01 ET JOB#: [id number]  cc: Huey Romans, MD, <Dictator> Huey Romans MD ELECTRONICALLY SIGNED 07/26/2012 11:20

## 2014-12-08 NOTE — Op Note (Signed)
PATIENT NAME:  Mark [name], Mark [name] MR#:  [id number] DATE OF BIRTH:  1956/01/31  DATE OF PROCEDURE:  10/22/2012  ATTENDING PHYSICIAN: Harrell Gave A. Lundquist, MD  ASSISTANT:  Loistine Simas, MD  PREOPERATIVE DIAGNOSIS:  Pharyngeal dysphasia and aspiration.   POSTOPERATIVE DIAGNOSIS:   Pharyngeal dysphagia and aspiration.  PROCEDURE PERFORMED:  Percutaneous endoscopic gastrostomy tube.   ESTIMATED BLOOD LOSS:  5 mL   COMPLICATIONS: None.   SPECIMENS: None.   INDICATION FOR SURGERY:  The patient is a pleasant 59 year old male with history of history of recurrent aspiration pneumonia, as well as dysphagia. He has had recently a lot of difficulty swallowing.  We have discussed this with Dr. Kathyrn Sheriff and the plan was to provide additional nutrition via PEG tube.   DETAILS OF PROCEDURE: Informed consent was obtained. The patient was brought to the operating room suite. He was sedated.  He abdomen was prepped and draped in standard surgical fashion. A timeout was then performed correctly identifying the patient name, operative site and procedure to be performed.  Dr. Gustavo Lah then placed the endoscope through the patient's mouth, through his esophagus and into his stomach. The light of the endoscope was well visualized on the patient's abdomen. I picked a spot approximately 2 cm inferior to the xiphoid  process and 2 cm lateral and then I pushed on the abdominal wall. There was a one-to-one deflection on the abdominal wall. This was visualized with the endoscope. I then placed the access needle through a nick in the patient's abdominal wall and passed into the stomach. It was visualized under direct visualization.  The needle was withdrawn., leaving a catheter.  The wire was then placed through the catheter and was grasped by the snare the endoscope.  The wire was then taken out as the scope was withdrawn through the patient's mouth.  The PEG tube was then attached the wire and the wire was pulled out  through the patient's the abdominal wall hole.  The tube then passed through the patient's mouth, through the esophagus, into the stomach.  It was then pulled out through the abdominal wall. The bumper rested securely on the patient's anterior stomach lining. The apparatus was then placed over the tube. The scope was placed through the esophagus and the tube was again visualized flush against the stomach wall at a depth of 2 cm.  The tube was then cut and placed to gravity bag drainage. The  tube was then sutured in place using 4 interrupted 3-0 silk sutures. The patient was then awakened and brought to the postanesthesia care unit. There were no immediate complications. Needle, sponge and instrument counts were correct at the end of the procedure.   ____________________________ Glena Norfolk. Lundquist, MD cal:ct D: 10/22/2012 17:50:00 ET T: 10/23/2012 09:02:59 ET JOB#: [id number]  cc: Harrell Gave A. Lundquist, MD, <Dictator> Floyde Parkins MD ELECTRONICALLY SIGNED 10/23/2012 11:16

## 2014-12-08 NOTE — H&P (Signed)
PATIENT NAME:  Mark [name], MCCARRICK MR#:  [id number] DATE OF BIRTH:  1956/08/06  DATE OF ADMISSION:  03/28/2013  PRIMARY CARE PHYSICIAN: Dr. Lennice Sites.   ONCOLOGIST: Dr. Grayland Ormond.  ENT: Dr. Erma Heritage.   CHIEF COMPLAINT: Fever of 102 and cough.   HISTORY OF PRESENT ILLNESS: Mark [name] is a 59 year old Caucasian gentleman with history of recurrent aspiration pneumonia, tonsillar carcinoma, status post PEG placement and COPD, who presents with fever of 102 and a productive cough for the last several days. The patient was discharged 02/10/2013 with similar symptoms and since then had 1 round of antibiotic treatment with Levaquin and was started on second round and is on day 2 of Levaquin again for productive cough and fever. He comes in today because he was found fallen down on the floor from his bed by wife in the middle of the night. He was having fever and had a temperature of 102. He was very tachycardic with heart rate in the 130s in the Emergency Room. He received 3.5 L of IV fluids. His blood pressure is still in the 70s. He will be started on Levophed after her central line placed by Dr. Reita Cliche in the Emergency Room. The patient's chest x-ray in the Emergency Room shows diffuse bilateral infiltrates, more prominent on the right. He is being admitted for sepsis secondary to recurrent aspiration pneumonia.   PAST MEDICAL HISTORY:  1.  Recurrent aspiration pneumonia.  2.  Tonsillar carcinoma.  3.  Chronic obstructive pulmonary disease.  4.  GERD.  5.  History of depression.  6.  Right knee surgery.  7.  History of panic attacks.   FAMILY HISTORY: Mother died of metastatic lung cancer. Father with CAD.   SOCIAL HISTORY: Lives at home with his wife. Denies smoking. Denies alcohol use.   ALLERGIES: MORPHINE.   HOME MEDICATIONS:   1.  Venlafaxine 37.5 p.o. b.i.d.  2.  Trazodone 150 mg at bedtime.  3.  Simvastatin 20 mg at bedtime.  4.  Oxycodone 30 mg 1 tablet 4 times a day.  5.  Omeprazole 40 mg  b.i.d.  6.  Levaquin 750 was started 2 days ago.  7.  Gabapentin 600 mg 4 times a day.  8.  Clonazepam 1 mg 3 times a day.  9.  Abilify 2 mg daily.   REVIEW OF SYSTEMS: CONSTITUTIONAL: Positive for fever, fatigue, weakness. EYES: No blurred or double vision or glaucoma or cataracts. ENT: No tinnitus, ear pain, hearing loss.  RESPIRATORY: Positive for cough productive phlegm, chronic obstructive pulmonary disease and shortness of breath. CARDIOVASCULAR: No chest pain, arrhythmia or hypertension.  GASTROINTESTINAL: No nausea, vomiting, diarrhea or abdominal pain. GENITOURINARY: No dysuria, hematuria or frequency. ENDOCRINE: No nocturia or thyroid problems. HEMATOLOGY: No anemia or easy bruising or bleeding disorder.  SKIN: No acne or rash. MUSCULOSKELETAL: Positive for arthritis. No gout or swelling of joints. NEUROLOGIC: No CVA, TIA, tremors or dysarthria. PSYCHIATRIC: Positive for some anxiety. No depression or bipolar disorder. All other systems reviewed are negative.   LABS AND DATA: UA negative for UTI. pH is 7.36, pCO2 51, pO2 of 55. Sats 91% on 3 L of nasal cannula oxygen. White count is 10.9, hemoglobin and hematocrit is 13.3 and 39.3. Comprehensive metabolic panel within normal limits except glucose 117, BUN is 22, creatinine is 1.27. Sodium is 132, potassium 3.8 and chloride is 95. LFTs within normal limits. Magnesium is 1.6. Troponin is 0.02. Chest x-ray consistent with bilateral diffuse infiltrates, more so on the right side of  the lung. EKG shows sinus tachycardia.   ASSESSMENT: A 59 year old, Mark [name], with history of squamous cell tonsillar carcinoma, history of status post PEG tube placement and recurrent aspiration pneumonia, most recent one 01/2013, who comes in with:  1.  Fever and productive cough. He was started on Levaquin 750 mg p.o. and was on day #2 of his treatment:  2.  Sepsis secondary to recurrent aspiration pneumonia. The patient came in with blood pressure in the 70s and  80s. He received more than 3 L of IV fluids. Blood pressure still remains in the 70s. He is going to be started on Levophed after a central line has been placed in the Emergency Room. Consent for line was obtained from the patient's wife and the patient. ER physician will be placing the central line. The patient will be started on Zosyn and Zithromax. We will follow blood cultures and sputum cultures. I will place him for pulmonary consultation. I will keep the patient n.p.o. and start the patient on Levophed.  3.  Recurrent aspiration pneumonia. The patient is on PEG tube feedings at home. Per wife, he is quite compliant with using only sponges for wetting his mouth; however, not sure given history of noncompliance in the past there is a possibility the patient could be taking more than he is required to causing him to have these episodes of recurrent aspiration pneumonia.  4.  Acute hypoxic respiratory failure with PaO2 of 55% on ABG. The patient has underlying chronic obstructive pulmonary disease. We will continue inhalers and nebulizers. He is not actively wheezing at this time. Will hold off on intravenous steroids.  5.  Hypomagnesemia. Replace with intravenous magnesium.  6.  History of tonsillar carcinoma, squamous cell. The patient is treated. He follows once a year with Dr. Grayland Ormond.  7.  Nutrition. PEG tube feeding. Will have dietitian see the patient in consultation for resuming PEG feeding.  8.  Deep venous thrombosis prophylaxis with subcutaneous heparin.  9.  Further workup per the patient's clinical course. Hospital admission plan was discussed with the patient and the patient's wife.   CRITICAL TIME SPENT: 60 minutes.   ____________________________ Hart Rochester Posey Pronto, MD sap:aw D: 03/28/2013 07:54:22 ET T: 03/28/2013 09:09:41 ET JOB#: [id number]  cc: Shlome Baldree A. Posey Pronto, MD, <Dictator> DR. Lennice Sites Kathlene November. Grayland Ormond, MD DR. Barrett Shell MD ELECTRONICALLY SIGNED 04/08/2013 5:43

## 2014-12-08 NOTE — H&P (Signed)
PATIENT NAME:  Mark [name], Mark [name] MR#:  [id number] DATE OF BIRTH:  1955-11-16  DATE OF ADMISSION:  06/11/2013  PRIMARY CARE PROVIDER:  Dr. Paula Libra.   ONCOLOGIST:  Dr. Grayland Ormond.   EMERGENCY DEPARTMENT REFERRING PHYSICIAN:  Dr. Jacqualine Code.   CHIEF COMPLAINT:  Shortness of breath, fever, cough.   HISTORY OF PRESENT ILLNESS:  The patient is a 59 year old white male with history of recurrent aspiration pneumonia, history of tonsillar cancer, status post PEG placement and a history of COPD who presents to the ED with a temperature of greater than 102 as well as productive cough for the last several days since last Tuesday.  The patient also has had fevers at home.  He was seen by his primary care provider and had an x-ray in the office which showed a left-sided pneumonia.  The patient in the ED was noted to have a WBC count of 30,000.  He is noted to have some hypotension, tachycardia and hypoxia.  The patient denies any chest pain.  No palpitations.  He reports that since August he has not been eating anything.  The only thing he does is sucks a licorice for a dry mouth which he was told that he could do.  He otherwise denies any abdominal pain, nausea, vomiting or diarrhea.   PAST MEDICAL HISTORY:   1.  Significant for recurrent aspiration pneumonia.  2.  History of tonsillar cancer, status post surgery, chemotherapy and radiation.  3.  Listed as having COPD.  The patient reports that he does not have COPD.  He has just been given QVAR to help with his lungs.  4.  GERD.  5.  History of depression.  6.  Status post right knee surgery.  7.  History of panic attacks.  8.  The patient has chronic respiratory failure and is on 2.5 liters of oxygen at all times.  ALLERGIES:  MORPHINE.   FAMILY HISTORY:  Mother died of metastatic lung cancer.  Father died with coronary artery disease.   SOCIAL HISTORY:  Lives at home with his wife.  Denies smoking.  No alcohol or drug use.    CURRENT MEDICATIONS AT HOME:  He  is on Abilify 2 mg daily, clonazepam 1 mg three times daily, gabapentin 600 4 times a day, glipizide 5 mg 1 tab by mouth twice daily, omeprazole 40 mg 1 tab by mouth twice daily, oxycodone 30 mg 1 tab 4 times a day as needed, QVAR 80 mcg 2 puffs twice daily, simvastatin 20 mg at bedtime, trazodone 150 at bedtime, venlafaxine 37.5 mg 1 tab by mouth twice daily.   REVIEW OF SYSTEMS:  CONSTITUTIONAL:  Complains of fever, fatigueness, weakness.  No pain.  No weight loss.  No weight gain.  EYES:  No blurred or double vision.  No pain.  No redness.  No inflammation.  No glaucoma.  No cataracts.  EARS, NOSE, THROAT:  No tinnitus.  No ear pain.  No hearing loss.  No seasonal or year-round allergies.  No epistaxis.  No nasal discharge.  No snoring.   RESPIRATORY:  Does have difficulty swallowing.  Complains of cough.  No wheezing.  No hemoptysis.  Has dyspnea.  No painful respirations.  Has recurrent aspiration pneumonia.  CARDIOVASCULAR:  Denies any chest pain, orthopnea, edema or arrhythmia.  GASTROINTESTINAL:  No nausea, vomiting, diarrhea.  No abdominal pain.  No hematemesis.  No melena.  No ulcer.  Has a history of GERD.  No IBS.  No jaundice.  GENITOURINARY:  Denies  any dysuria, hematuria, renal calculus or frequency.  ENDOCRINE:  Denies any polyuria, nocturia or thyroid problems.  HEMATOLOGIC AND LYMPHATIC:  Denies anemia, easy bruisability or bleeding.  SKIN:  No acne.  No rash.  No changes in mole, hair or skin.  MUSCULOSKELETAL:  Denies any pain in the neck, back or shoulder.  NEUROLOGIC:  No numbness.  No CVA.  No TIA or seizures.  PSYCHIATRIC:  Has anxiety, history of panic attacks.   PHYSICAL EXAMINATION: VITAL SIGNS:  Temperature 97.9, pulse 112, respirations 22, blood pressure 93/63, O2 92% on 2 liters.    GENERAL:  The patient is a thin male in no acute distress.  HEENT:  Head normocephalic, atraumatic.  Eyes, pupils equally round, reactive to light and accommodation.  No conjunctival  pallor.  No scleral icterus.  Extraocular movements intact.  Nasal exam shows no nasal lesion or drainage.  Ears, no drainage or external lesions.  Mouth, without any exudate, moist.  NECK:  Supple and symmetric.  No masses.  Thyroid midline, not enlarged.  No JVD.  RESPIRATORY:  He has got good respiratory effort.  He has got the left lung rhonchi throughout the left lung.  There is no wheezing, crackles or rales.  CARDIOVASCULAR:  Regular rate and rhythm.  No murmurs, clicks, gallops or heaves.  ABDOMEN:  Soft, nontender, nondistended.  Positive bowel sounds x 4.  No hepatosplenomegaly.  GENITOURINARY:  Deferred.  MUSCULOSKELETAL:  There is no erythema or swelling.  SKIN:  No rash.  LYMPHATICS:  No lymph nodes palpable.  VASCULAR:  Good DP, PT pulses.  NEUROLOGICAL:  Cranial nerves II through XII grossly intact.  No focal deficits.  PSYCHIATRIC:  Not anxious or depressed.   LABORATORY DATA:  Glucose 239, BUN 27, creatinine 1.13, sodium 132, potassium 4.2, chloride 95, CO2 is 30, magnesium was 1.5, WBC 7.1, albumin at 2.5, bilirubin total 0.2, bilirubin direct is less than 0.1, alk phos 163.  Troponin less than 0.02.  WBC 30.9, hemoglobin 9.9, platelet count 313.  ABG, pH of 7.46, pCO2 of 48, pO2 of 59.  Chest x-ray shows left lung infiltrate.  Most of the lung is whited out.   ASSESSMENT AND PLAN:  The patient is a 59 year old with recurrent aspiration pneumonia, chronic obstructive pulmonary disease, gastroesophageal reflux disease, presents with shortness of breath.  1.  Recurrent left-sided pneumonia, possibly due to aspiration pneumonia.  The patient not taking any by mouth, by mouth, but does suck on licorice, possibly could have aspiration of his own saliva.  At this time, we will treat him with IV Zosyn and azithromycin.  In light of the severity of the pneumonia and possible sepsis I will also had vancomycin to current treatment.  We will also ask speech to evaluate the patient, however  this will not change his management.  2.  Sepsis/systemic inflammatory response syndrome, possibly due to #1.  3.  Dysphagia.  We will hold tube feeds for now, resume likely in the morning.  4.  Gastroesophageal reflux disease.  We will continue omeprazole as taking at home.  5.  History of chronic obstructive pulmonary disease, on QVAR which we do not carry here.  We will place him on albuterol, Atrovent nebs as needed.  6.  Hypomagnesemia.  We will replace his magnesium and recheck magnesium in the a.m.  7.  Hyperglycemia.  We will check a hemoglobin A1c.  I will place him on sliding scale insulin.  8.  Miscellaneous:  I will place him  on Lovenox for DVT prophylaxis.  9.  CODE STATUS:  FULL.   NOTE:  50 minutes spent on this patient.      ____________________________ Lafonda Mosses. Posey Pronto, MD shp:ea D: 06/11/2013 17:07:45 ET T: 06/11/2013 17:25:36 ET JOB#: [id number]  cc: Keishana Klinger H. Posey Pronto, MD, <Dictator> Alric Seton MD ELECTRONICALLY SIGNED 06/14/2013 12:14

## 2014-12-08 NOTE — Discharge Summary (Signed)
PATIENT NAME:  Mark [name], Mark [name] MR#:  [id number] DATE OF BIRTH:  08-01-1956  DATE OF ADMISSION:  03/28/2013 DATE OF DISCHARGE:  03/31/2013  PRESENTING COMPLAINT: Shortness of breath and fever.   DISCHARGE DIAGNOSES:  1.  Sepsis secondary to recurrent aspiration pneumonia.  2.  Chronic obstructive pulmonary disease with ongoing tobacco abuse.  3.  Hypoxia, now on home oxygen.  4.  Type 2 diabetes.  5.  History of throat cancer with chronic dysphagia requiring to PEG feeding. Sats 81%  on room air and exertion, 94 to 96% on nasal cannula continuous.   CODE STATUS: Full code.   MEDICATIONS:  1.  Clonazepam 1 mg t.i.d.  2.  Abilify 2 mg daily. 3.  Omeprazole 40 mg b.i.d.  4.  Trazodone 150 mg at bedtime.  5.  Oxycodone 30 mg 4 times a day as needed.  6.  Gabapentin 1 tablet 4 times a day.  7.  Venlafaxine 37.5 mg b.i.d.  8.  Simvastatin 20 mg daily.  9.  Levaquin 750 mg p.o. daily. Total number of days 10.  10. Glipizide 5 mg b.i.d.  11. Glucerna 1.5 Cal bolus feeds 3 times a day.   DIET: The patient is recommended to stay n.p.o. through.  Meds through PEG tube.    Oxygen 2 liters nasal cannula.   FOLLOWUP: With Dr. Lennice Sites in [place] in 1 to 2 weeks.   CONSULTATION: Pulmonary consultation with Dr. Mortimer Fries.  LABS AND X-RAYS: Phosphorus 2.8, potassium 3.7. Hemoglobin A1c is 8.6. White count is 13.2. BUN and creatinine within normal limits at 14 and 0.73. Magnesium is 2.0. Chest x-ray consistent with progressive interstitial very low density in the right lung. UA negative for UTI. Blood cultures negative in 48 hours. Cardiac enzymes negative. EKG on admission: Sinus tachycardia. White count on admission was 10.9.  BRIEF SUMMARY OF HOSPITAL COURSE: Mark [name] is a 59 year old Caucasian male with history of squamous cell tonsillar carcinoma, history of PEG tube placement and recurrent aspiration pneumonia, most recent 08/23/2012, comes in with:  1.  Sepsis secondary to recurrent aspiration  pneumonia. He came in with blood pressure of 70s and 80s and received 3 L of IV fluids. He was then admitted in the Intensive Care Unit, started on Levophed which was weaned off as blood pressure improved. The patient was placed on Zosyn, vancomycin and Zithromax. The patient's cultures remained negative. Vancomycin was discontinued. The patient's antibiotics were changed to p.o. Levaquin at discharge. The patient was unable to cough up any phlegm for sputum culture. He was seen by Dr. Mortimer Fries.  2.  Recurrent aspiration pneumonia due to chronic dysphagia from throat cancer and patient noncompliance with oral intake. The patient continued to take applesauce and a significant amount of fluids to avoid his mouth getting dry at home. He was recommended to stay n.p.o. other than use of oral swabs. He was continued on his PEG tube feeding and meds were also given through PEG.  3.  Acute on chronic hypoxic respiratory failure with ongoing tobacco abuse and history of COPD. The patient was continued on inhalers and nebulizers. He is not actively wheezing. He has been set up with home oxygen at this time given his persistent hypoxemia.  4.  Hypomagnesemia and hypophosphatemia, replaced.  5.  History of tonsillar carcinoma, squamous cell. The patient has been treated. He follows once a year with Dr. Grayland Ormond.  6.  Type 2 diabetes. Hemoglobin A1c was 8.6. The patient was on meds before. We will resume  glipizide via PEG and change PEG tube feedings to Glucerna.  7.  DVT prophylaxis. Subcutaneous heparin. Hospital stay otherwise remained stable. The patient remained a full code.   TIME SPENT: 40 minutes.  ____________________________ Hart Rochester Posey Pronto, MD sap:aw D: 04/01/2013 06:50:47 ET T: 04/01/2013 07:03:24 ET JOB#: [id number]  cc: Oyindamola Key A. Posey Pronto, MD, <Dictator> ____________ Ilda Basset MD ELECTRONICALLY SIGNED 04/08/2013 5:43

## 2014-12-08 NOTE — Consult Note (Signed)
PATIENT NAME:  Mark [name], Mark [name] MR#:  [id number] DATE OF BIRTH:  25-Jun-1956  DATE OF CONSULTATION:  10/19/2012  REFERRING PHYSICIAN:      Dr. Bridgette Habermann and Dr. Darvin Neighbours CONSULTING PHYSICIAN:  Lollie Sails, MD  REASON FOR CONSULTATION: Problems with recurrent aspiration pneumonia and oropharyngeal dysphagia.   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 This was diagnosed in December on the 21st in 2009. He was admitted to the hospital on 10/17/2012 with weakness and syncope. The patient had gotten up in the afternoon and had fallen. On admission to the hospital he was found to have an elevated white count as well as right lower lobe pneumonia. He was admitted to the hospitalist service with hypotension, tachycardia, acute renal insufficiency and right lower lobe pneumonia. Per history, he has had previous episodes of aspiration pneumonia. He recently underwent dilatation by Dr. Kathyrn Sheriff  at his office of an upper esophageal stricture, probably related with his radiation treatment for the tonsillar cancer. That was done on 07/26/2012. He did have a modified barium swallow on 10/19/2012 showing pharyngeal residue and severe oropharyngeal dysphagia. He also had a modified barium swallow in November 2013 also showing aspiration.   PAST MEDICAL HISTORY: He has a history of right knee surgery, COPD, tobacco abuse, gastroesophageal reflux.   OUTPATIENT MEDICATIONS:  Abilify 2 mg once a day, Chantix 1 tablet twice a day, clonazepam 1 mg 3 times a day, etodolac 400 mg once a day, gabapentin 800 mg 4 times a day, omeprazole 40 mg 1 twice a day, oxycodone 30 mg 1 every 6 hours p.r.n., Pristiq 50 mg once a day, trazodone 150 mg once a day.   ALLERGIES:  HE IS ALLERGIC TO MORPHINE>  REVIEW OF SYSTEMS: Per admission history and physical, agree with same.   SOCIAL  HISTORY: He continues to smoke about 3 or 4 cigarettes a day. No alcohol use. No illicit drugs.   PHYSICAL EXAMINATION: VITAL SIGNS: Temperature 98.2, pulse 111, respirations 18, blood pressure 129/73, pulse oximetry 96%.  GENERAL: He is a 59 year old Caucasian male in no acute distress.  HEENT: Normocephalic, atraumatic. Eyes are anicteric.  Nose:  Septum midline.  Oropharynx shows no lesions. NECK: No JVD.  HEART: Regular rate and rhythm.  LUNGS: Clear.  ABDOMEN: Soft, nontender, nondistended. Bowel sounds positive, normoactive.  RECTAL: Anorectal exam deferred.   LABORATORY AND DIAGNOSTIC DATA:  On 10/19/2012 showed a white count of 17.5, hemoglobin and hematocrit 10.9/33.5, platelet count 198. His previous chemistry panel was normal with the exception of an anion gap low at 3.  On March 4 he had a chest x-ray showing bilateral diffuse interstitial thickening likely representing interstitial edema versus interstitial pneumonitis secondary to infectious or inflammatory etiology. There was interval improvement of the right lower lobe consolidation.   ASSESSMENT: Likely aspiration pneumonia in the setting of previous history of radiation treatment for tonsillar cancer. This has been a recurrent issue. Question of whether possible percutaneous endoscopic gastrostomy tube to assist in avoiding aspiration complications. A percutaneous endoscopic gastrostomy tube placed in the stomach will not prevent aspiration. Feeding tube would need to be placed in the small intestine below the ligament of Treitz to best help aspiration. Also there would need to be an avoidance of p.o. intake. This would be a surgically placed tube. Would recommend a surgical consult, also recommending consulting Dr. Kathyrn Sheriff as he is  familiar with this patient, having recently dilated his esophagus. Will discuss with Dr. Bridgette Habermann in the morning.     ____________________________ Lollie Sails, MD mus:ct D: 10/22/2012  07:23:58 ET T: 10/22/2012 08:39:15 ET JOB#: [id number]  cc: Lollie Sails, MD, <Dictator> Lollie Sails MD ELECTRONICALLY SIGNED 11/03/2012 18:21

## 2014-12-08 NOTE — Consult Note (Signed)
Chief Complaint:  Subjective/Chief Complaint patient seen for concern of aspiration in setting of recurrent aspiration pneumonia, some dysphagia and SCCa tonsillar.  eating some blended foods, patient denies difficulty.  pneumonia sx improved, af, continuing on abx.   VITAL SIGNS/ANCILLARY NOTES: **Vital Signs.:   06-Mar-14 13:35  Vital Signs Type Q 4hr  Temperature Temperature (F) 98  Celsius 36.6  Pulse Pulse 80  Respirations Respirations 18  Systolic BP Systolic BP 825  Diastolic BP (mmHg) Diastolic BP (mmHg) 75  Mean BP 86  Pulse Ox % Pulse Ox % 95  Pulse Ox Activity Level  At rest  Oxygen Delivery Room Air/ 21 %   Brief Assessment:  Cardiac Regular   Respiratory clear BS   Gastrointestinal details normal Soft  Nontender  Nondistended  No masses palpable  Bowel sounds normal   Assessment/Plan:  Assessment/Plan:  Assessment 1) severe oropharyngeal dysphagia with concern for recurrent aspiration pneumonia.  Case discussed with Dr Rexene Edison and Dr Bridgette Habermann and patient and family.  PEG will not eliminate the risk for recurrent aspiration, particularly if patient continues to try to eat orally.  Patient and family understand this.   Plan 1) will plan for PEG with surgical assistance for am.  I have discussed the risks benefits and complications of egd to include not limited to bleeding infection perforation and sedation and he and family wish to proceed.   Electronic Signatures for Addendum Section:  Loistine Simas (MD) (Signed Addendum 07-Mar-14 07:07)  I have discussed the risks benefits and complications of egd to include not limited to bleeding infection perforation and sedation and he wishes to proceed.   Electronic Signatures: Loistine Simas (MD)  (Signed 06-Mar-14 18:18)  Authored: Chief Complaint, VITAL SIGNS/ANCILLARY NOTES, Brief Assessment, Assessment/Plan   Last Updated: 07-Mar-14 07:07 by Loistine Simas (MD)

## 2014-12-08 NOTE — Discharge Summary (Signed)
PATIENT NAME:  Mark [name], Mark [name] MR#:  [id number] DATE OF BIRTH:  Jul 25, 1956  DATE OF ADMISSION:  10/17/2012 DATE OF DISCHARGE:  10/24/2012  PRESENTING COMPLAINT:  Weakness and syncope.   DISCHARGE DIAGNOSES:   1.  Sepsis suspected secondary to aspiration pneumonia, recurrent.  2.  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  3.  Gastroesophageal reflux disease.  4.  History of tonsillar carcinoma.   PROCEDURE:  PEG tube placement.   CODE STATUS:  Full code.   MEDICATIONS:   1.  Clonazepam 1 mg 3 times a day.  2.  Abilify 2 mg daily.  3.  Gabapentin 800 mg 4 times a day.  4.  Omeprazole 40 mg p.o. b.i.d.  5.  Trazodone 150 mg p.o. at bedtime.  6.  Oxycodone 30 mg p.o. q. 6 hours as needed.  7.  Chantix 1 mg b.i.d.  8.  Pristiq 50 mg extended release p.o. daily.  9.  Etodolac 400 mg extended-release 1 tablet daily.  10.  Jevity 1.5 Cal bolus feed 360 mL 4 times a day via PEG tube.   FOLLOWUP:   1.  With your primary care provider, Mark [name], Mark [name], at [facility] in [place], phone number [phone].  2.  Follow up with Dr. Gustavo [name] in 1 to 2 weeks.  3.  Follow up with Dr. Kathyrn [name] of ENT in 1 to 2 weeks.   CONSULTATIONS:  GI, Dr. Gustavo [name], surgery, Dr. Rexene [name], ENT, Dr. Kathyrn [name].   PROCEDURES:  Upper GI with placement of PEG tube by Dr. Gustavo [name] and Dr. Rexene [name].  BRIEF SUMMARY OF HOSPITAL COURSE:  The patient is a 59 year old Caucasian gentleman with history of tonsillar carcinoma requiring radiation therapy in the past comes in with:  1.  Sepsis secondary to suspected right lower lobe pneumonia aspiration, recurrent. >swallow evaluation has had a history of aspiration along with some chronic/silent aspiration. He was initially started on IV vancomycin and Zosyn which was then switched to Augmentin to complete a 7-day course. White count normalized. The patient did not have any  more fevers. Blood cultures remained negative.  2.  Right lower lobe pneumonia improved after antibiotics. Suspected due to aspiration from chronic dysphagia.  3.  Chronic dysphagia secondary to suspected side effects of radiation from tonsillar carcinoma. The patient was evaluated by Dr. Kathyrn [name] and recommended a PEG placement which was performed by Dr. Rexene [name] and Dr. Gustavo [name] after he was seen in consultation.  4.  Chronic obstructive pulmonary disease remained stable.  5.  Tobacco abuse. Counseled for cessation.  6.  Acute renal failure, resolved after IV hydration.   Hospital stay otherwise remained stable. The patient was discharged with home health. He will follow up with the above physicians along with his primary care provider, Mark Cowman, PA-C, in [facility].   TIME SPENT:  40 minutes.   ____________________________ Hart Rochester Posey Pronto, MD sap:si D: 10/25/2012 14:25:53 ET T: 10/25/2012 18:02:03 ET JOB#: [id number]  cc: Sona A. Posey Pronto, MD, <Dictator> Mark Cowman,  PA-C, [facility], [place], [place] Lollie Sails, MD Huey Romans, MD Glena Norfolk. Mark Edison, MD  Ilda Basset MD ELECTRONICALLY SIGNED 10/28/2012 15:09

## 2014-12-08 NOTE — Consult Note (Signed)
Chief Complaint:  Subjective/Chief Complaint Covering for Dr. Gustavo Lah. S/P PEG.   VITAL SIGNS/ANCILLARY NOTES: **Vital Signs.:   08-Mar-14 09:27  Vital Signs Type Q 4hr  Temperature Temperature (F) 97.9  Celsius 36.6  Temperature Source oral  Pulse Pulse 71  Respirations Respirations 18  Systolic BP Systolic BP 015  Diastolic BP (mmHg) Diastolic BP (mmHg) 79  Mean BP 98  Pulse Ox % Pulse Ox % 92  Pulse Ox Activity Level  At rest  Oxygen Delivery 1L   Brief Assessment:  Cardiac Regular   Respiratory clear BS   Gastrointestinal PEG site intact. Sore to palpation.   Lab Results: Routine Hem:  06-Mar-14 06:14   WBC (CBC) 10.5  RBC (CBC)  3.48  Hemoglobin (CBC)  10.2  Hematocrit (CBC)  30.3  Platelet Count (CBC) 201  MCV 87  MCH 29.3  MCHC 33.7  RDW  14.9  Neutrophil % 83.1  Lymphocyte % 8.2  Monocyte % 5.1  Eosinophil % 3.3  Basophil % 0.3  Neutrophil #  8.8  Lymphocyte #  0.9  Monocyte # 0.5  Eosinophil # 0.3  Basophil # 0.0 (Result(s) reported on 21 Oct 2012 at 07:16AM.)   Assessment/Plan:  Assessment/Plan:  Assessment S/P PEG for dysphagia.   Plan Ok to start TF. Make sure patient flushes well before and after tube feedings. Ok to take pills orally with sips of water. However, if patient aspirates pills, then will need to take pills via G tube as well. Will sign off. Thanks.   Electronic Signatures: Verdie Shire (MD)  (Signed 08-Mar-14 10:53)  Authored: Chief Complaint, VITAL SIGNS/ANCILLARY NOTES, Brief Assessment, Lab Results, Assessment/Plan   Last Updated: 08-Mar-14 10:53 by Verdie Shire (MD)

## 2014-12-08 NOTE — Consult Note (Signed)
PATIENT NAME:  Mark [name], Mark [name] MR#:  [id number] DATE OF BIRTH:  1955/08/25  DATE OF CONSULTATION:  10/21/2012  CONSULTING PHYSICIAN:  Harrell Gave A. Lundquist, MD  REASON FOR CONSULTATION:  Aspiration evaluation for a J-tube.   HISTORY OF PRESENT ILLNESS:  The patient is a pleasant 59 year old male with a history of COPD, tonsillar cancer, GERD and syncope who presented to the ED on the 2nd for weakness and a syncopal episode. He was found to have an elevated white blood cell count and a pneumonia in the right lower lobe. He has had previous syncopal episodes as well as pneumonias recently since his radiation. Otherwise, no fevers, chills, night sweats, shortness of breath. Does have difficulty swallowing, particularly the last 2 weeks after his last upper endoscopy. No complaints of abdominal pain, nausea, vomiting, diarrhea, constipation, dysuria, hematuria.   1. PAST MEDICAL HISTORY: 2. Tonsillar cancer status post chemoradiation.  3. History of right knee surgery.  4. COPD.  5. Tobacco abuse. 6. GERD. 7. Reflux.   ALLERGIES:  MORPHINE, WHICH CAUSES VOMITING.   SOCIAL HISTORY:  Smokes about 3 to 4 cigarettes a day, improved from before.  No alcohol. No drugs.   HOME MEDICATIONS:  1. Abilify.  2. Chantix.  3. Diazepam.  4. Etodolac. 5. Gabapentin. 6. Omeprazole.  7. Oxycodone.   8. Pristiq.  9. Trazodone.  FAMILY HISTORY:  Noncontributory.  PHYSICAL EXAMINATION: VITAL SIGNS:  Temperature 98.2, pulse 90, blood pressure 116/66, respirations 18, 96% on 1 liter.  GENERAL:  No acute distress. Alert and oriented x 3, pleasant.  HEENT:  Head:  Normocephalic, atraumatic. Eyes:  No scleral icterus. No conjunctivitis. Face:  No obvious facial trauma. Normal external nose. Normal external ears. Throat:  He appears hoarse when he talks but no obvious masses in his throat.   CHEST:  Lungs clear to auscultation moving air well .  HEART:  Regular rate and rhythm. No murmurs, rubs, or  gallops.  ABDOMEN:  Soft, nontender, nondistended.  EXTREMITIES:  Moves all extremities well. Strength 5 out of 5.  NEUROLOGICAL:  Cranial nerves II through XII are grossly intact all 4  extremities. Sensation intact in all 4 extremities.   LABORATORY AND RADIOLOGICAL DATA:  Currently, a white cell count of 10.5, otherwise labs were unremarkable.    X-ray from March 4th shows a right-sided infiltrate.   ASSESSMENT AND PLAN: The patient is a pleasant 59 year old male with history of aspiration and some difficulty swallowing.  I spoke with Dr. Gustavo Lah and Dr. Kathyrn Sheriff regarding the patient's swallowing and need for a possible operative tube.  Dr. Gustavo Lah feels that a G-tube will not prevent aspiration, while Dr. Kathyrn Sheriff mentions that aspiration is minimal and a G-tube is the most appropriate way to provide additional nutrition as the patient has difficulty swallowing, but is not concerned as much for aspiration based on previous studies. I have discussed with Dr. Kathyrn Sheriff that I do not believe that a J-tube is warranted, and we will plan for PEG.  ____________________________ Glena Norfolk. Lundquist, MD cal:cb D: 10/21/2012 09:29:15 ET T: 10/21/2012 09:54:08 ET JOB#: [id number] cc: Harrell Gave A. Lundquist, MD, <Dictator> Floyde Parkins MD ELECTRONICALLY SIGNED 10/23/2012 11:15

## 2014-12-08 NOTE — Consult Note (Signed)
Brief Consult Note: Diagnosis: aspiration, oropharyngeal dysphagia.   Patient was seen by consultant.   Recommend further assessment or treatment.   Comments: Patient patient presenting  with second episodeof aspiration pneumonia.  Patient with h/o throat cancer, with radiation treatment.  Now with oropharyngeal dysphagia.  Question of possible PEG to assist in avoiding aspiration complications.  PEG placed into the stomach will not prevent aspiration.  Feeding tube would need to be placed into the small intestine below the stomach, below the ligament of treitz to best help prevent aspiration.  This is a surgically placed tube, not endoscopically assisted.  Recommend surgery consult, also recommend consulting Dr Daisy Blossom as he is familiar with Mr Cates, having recently dilated his esophagus.  Will discuss with Dr Bridgette Habermann in am, full consult to follow.  Electronic Signatures for Addendum Section:  Loistine Simas (MD) (Signed Addendum 07-Mar-14 07:24)  [phone] dictated 10/22/12   Electronic Signatures: Loistine Simas (MD)  (Signed 04-Mar-14 23:37)  Authored: Brief Consult Note   Last Updated: 07-Mar-14 07:24 by Loistine Simas (MD)

## 2014-12-08 NOTE — H&P (Signed)
DATE OF BIRTH:  07-25-1956  DATE OF ADMISSION:  10/17/2012  PRIMARY CARE PHYSICIAN:  [place]  CHIEF COMPLAINT:  Weakness, syncope.   HISTORY OF PRESENT ILLNESS:  A 59 year old Caucasian male patient with history of COPD, tonsillar cancer, GERD and syncope. He presented to the Emergency Room complaining of weakness and syncopal episode earlier today. The patient was feeling weak, and continued to sleep in bed. Did not wake up in the morning, but tried to get up at 2:00 p.m. and passed out. He did not hit his head. He did not have any seizures or incontinence witnessed by his wife. He does not complain of any cough, shortness of breath, chest pain, palpitations, nausea, vomiting,  diarrhea. The patient has been found to have elevated white count of 37,000 with acute renal failure, with elevated creatinine and right lower lobe pneumonia, and is being admitted to the hospitalist service with sepsis with hypotension, tachycardia, acute renal failure, elevated white count and right lower lobe pneumonia.   The patient had 2 prior syncopal episodes in the last 2 months, is being worked up by a cardiologist, Dr. Edsel Petrin as outpatient with a Holter monitor.   PAST MEDICAL HISTORY:  Tonsillar carcinoma, right knee surgery, COPD,  tobacco abuse, GERD.   ALLERGIES:  MORPHINE, which causes vomiting.   FAMILY HISTORY:  His father is living and healthy.   REVIEW OF SYSTEMS.  CONSTITUTIONAL: Complains of fatigue, weakness. No weight loss, weight gain.  EYES:  No blurred vision, pain, redness.  EARS, NOSE, THROAT:  No tinnitus, ear pain, hearing loss. Had throat cancer. Dysphagia, for which he had esophageal dilation.  RESPIRATORY:  No cough, wheeze, hemoptysis, dyspnea.  CARDIOVASCULAR: No chest pain, orthopnea, edema. GASTROINTESTINAL:  No nausea, vomiting, diarrhea, abdominal pain.  GENITOURINARY:  No dysuria, hematuria, frequency.   ENDOCRINE: No polyuria, nocturia, thyroid problems.   HEMATOLOGIC/LYMPHATIC:  No  anemia, easy bruising, bleeding.  INTEGUMENTARY:  No acne, rash, lesions.  MUSCULOSKELETAL:  No shoulder pain, back pain. NEUROLOGIC:  No focal numbness, weakness, seizures.  PSYCHIATRIC:  No anxiety, depression,  insomnia.   HOME MEDICATIONS INCLUDE:  1.  Abilify 2 mg oral once a day.  2.  Chantix 1 mg oral 2 times a day.  3.  Diazepam 1 mg oral 3 times a day.  4.  Etodolac 400 mg oral once a day.  5.  Gabapentin 800 mg oral 4 times a day.  6.  Omeprazole 40 mg oral 2 times a day.  7.  Oxycodone 30 mg oral every 6 hours as needed.  8.  Pristiq 50 mg oral once a day.  9.  Trazodone 150 mg oral once a day at bedtime.   SOCIAL HISTORY:  The patient smokes about 3 to 4 cigarettes a day, which is improved from before. . No alcohol. No illicit drugs.   CODE STATUS:  FULL CODE.   PHYSICAL EXAMINATION: VITAL SIGNS: Temperature 97.6, pulse of 93, blood pressure 74/50, improved to 110/72, saturating 90% on room air.  GENERAL:  Moderately built Caucasian male patient sitting in bed, comfortable, conversational, without any trouble breathing.  PSYCHIATRIC:  Alert, oriented x 3. Mood and affect appropriate. Judgment intact.  HEENT: Atraumatic, normocephalic. Oral mucosa moist and pink. External ears and nose normal. No pallor. No icterus. Pupils bilaterally equal and react to light.  NECK:  Supple. No thyromegaly. No palpable lymph nodes. Trachea midline. No carotid bruit, JVD.  CARDIOVASCULAR:  S1, S2, without any murmurs. No edema.  RESPIRATORY:  Normal work of breathing. Has crackles in the right lower lobe. Good air entry on both sides.  GASTROINTESTINAL: Abdomen soft, nontender. Bowel sounds present. No hepatosplenomegaly palpable.  SKIN:  Warm and dry. No petechiae, rash, ulcers.  MUSCULOSKELETAL:  No joint swelling, redness, effusion of the large joints. Normal muscle tone.  GENITOURINARY:  No CVA tenderness, bladder distention.  LYMPHATIC:  No cervical  lymphadenopathy.  NEUROLOGIC:  Motor strength 5/5 in upper and lower extremities. Sensation to fine touch intact all over. Cerebellar signs intact.   LAB STUDIES:  Show glucose of 121, BUN 24, creatinine 1.32, with sodium 129, potassium 4.9, chloride 95. GFR is 60. AST, ALT, alkaline phosphatase, bilirubin normal. CK of 86. Troponin less than 0.02. WBC 34, hemoglobin 11.4, platelets of 240. Urinalysis shows no bacteria, no WBC.   Chest x-ray shows right lower lobe infiltrate.   CT of the head without contrast shows no acute abnormalities.   CT cervical spine shows no acute injury of the cervical spine.   EKG shows normal sinus rhythm, no acute ST-T wave changes.   ASSESSMENT AND PLAN: 1.  Sepsis due to right lower lobe pneumonia. The patient's hypertension is improved. Will continue the IV fluid resuscitation and closely follow.   2.  Right lower lobe pneumonia. Get blood and sputum cultures. Start patient on antibiotics broadly with ceftriaxone and Levaquin, considering the elevated white count, hypotension with sepsis. Also nebulizers p.r.n. and oxygen p.r.n. Surprisingly, patient does not have any cough, shortness of breath or chest pain, but this is the only source for infection with elevated white count, and will be treated.   3.  Acute renal failure. Worsening creatinine at 1.29, baseline creatinine of 0.9, likely secondary from ATN, sepsis, hypotension. Continue IV fluids, repeat in the morning.   4.  Syncope. Today is likely from the sepsis and hypotension. Patient's cardiac enzymes are normal here. EKG shows no acute abnormalities.   5.  The patient is presently being worked up by his cardiologist as outpatient with a Holter monitor. The patient will follow up with him.   6.  Tobacco abuse: The patient has been counseled for greater than 3 minutes to quit smoking. He does understand the involvement of smoking with pneumonias and throat cancers, and is trying to quit slowly.   7.   Chronic obstructive pulmonary disease, stable.   8.  Throat cancer, in remission. No acute needs.   9.  Deep vein prophylaxis with heparin.   10.  FULL CODE.   Time spent today on this case was 55 minutes.    ____________________________ Leia Alf Sudini, MD srs:mr D: 10/17/2012 18:43:52 ET T: 10/17/2012 20:35:07 ET JOB#: [id number]  cc: Alveta Heimlich R. Sudini, MD, <Dictator> Neita Carp MD ELECTRONICALLY SIGNED 10/18/2012 14:54

## 2014-12-08 NOTE — Discharge Summary (Signed)
PATIENT NAME:  Mark [name], Mark [name] MR#:  [id number] DATE OF BIRTH:  09/30/1955  DATE OF ADMISSION:  06/11/2013 DATE OF DISCHARGE:  06/14/2013  DISCHARGE DIAGNOSES:  1. Left lower lobe pneumonia secondary to aspiration and Klebsiella.  2. Acute on chronic respiratory failure.  3. Tonsillar cancer.  4. Dysphagia with PEG tube in place.  5. Hypomagnesemia.  6. Diabetes mellitus type 2.   IMAGING STUDIES DONE: Include a chest x-ray which showed left-sided pneumonia.   ADMITTING HISTORY AND PHYSICAL: Please see detailed H and P dictated by Dr. Dustin Flock on 06/11/2013. In brief, a 59 year old Caucasian male patient with history of tonsillar cancer, dysphagia and a PEG tube in place, who presented to the hospital complaining of shortness of breath, fever and cough. The patient had similar admissions in the past and had aspiration pneumonia. The patient was admitted to the hospitalist service secondary to acute on chronic respiratory failure and new pneumonia with WBC count of 30,000 with sepsis.   HOSPITAL COURSE: Aspiration pneumonia. The patient was started on broad-spectrum antibiotics with vancomycin, Zosyn and azithromycin from the Emergency Room as the patient had a hospital stay within 3 months. Sputum cultures were sent, which grew Staphylococcus aureus and Klebsiella pneumoniae. The patient's antibiotics were later transitioned to clindamycin and Levaquin. The patient has done well, with white count trending down from 30,000 to 17,000 by the day of discharge. The patient back to baseline of 2.5 liters oxygen that he uses at home. The patient was seen by the dietitian in the hospital and the patient placed on 7 cans of bolus feeds, with which he has done well. He has been given instructions to stay upright after feeds to prevent any further aspiration. The patient follows with Dr. Kathyrn Sheriff of ENT and has a speech evaluation as an outpatient pending in November.   Blood cultures have been negative,  afebrile. The patient feeling back to baseline by the day of discharge. Lung examination showed bilateral rhonchi, normal work of breathing, and the patient is being discharged back home on oxygen.   DISCHARGE MEDICATIONS: Include:  1. QVAR 2 puffs inhaled 2 times a day.  2. Oxycodone 30 mg through the PEG tube 4 times a day as needed.  3. Gabapentin 600 mg 4 times a day through PEG.  4. Trazodone 150 mg oral once a day at bedtime.  5. Glipizide 5 mg 2 times a day before meals.  6. Venlafaxine 37.5 mg 2 times a day.  7. Simvastatin 20 mg once a day.  8. Abilify 2 mg once a day. 9. Klonopin 1 mg 3 times a day.  10. Omeprazole 40 mg 2 times a day.  11. Levaquin 750 mg through PEG once a day for 6 days. 12. Clindamycin 300 mg through PEG 3 times a day for 6 days.  13. Combivent Respimat 1 puff inhaled 4 times a day.   DISCHARGE INSTRUCTIONS: The patient was discharged home on nothing by mouth, PEG feeds. Follow up with Dr. Kathyrn Sheriff of ENT and primary care physician in 1 to 2 weeks. Activity as tolerated.   TIME SPENT ON DAY OF DISCHARGE IN DISCHARGE ACTIVITY: 45 minutes.   ____________________________ Leia Alf Ahnesti Townsend, MD srs:lb D: 06/15/2013 13:36:55 ET T: 06/15/2013 14:59:58 ET JOB#: [id number]  cc: Alveta Heimlich R. Shamonique Battiste, MD, <Dictator> Neita Carp MD ELECTRONICALLY SIGNED 06/17/2013 13:00

## 2014-12-08 NOTE — Consult Note (Signed)
DATE OF BIRTH:  February 27, 1956  DATE OF CONSULTATION:  10/19/2012  REQUESTING  PHYSICIAN:  Dr. Darvin Neighbours  CONSULTING PHYSICIAN:  Huey Romans, MD  REASON FOR CONSULTATION:  Evaluate the pharynx because of dysphagia and possible aspiration.   HISTORY OF PRESENT ILLNESS:  The patient is a 59 year old white male who has had a history of tonsillar cancer 3 years ago on his right side. He had chemo and radiation therapy for controlling this. He has been followed in the office, with no evidence of any recurrence at all. He has had dysphagia for some time on and off. He had upper esophageal sphincter dilation done approximately 6 months ago or so, with some improvement of his swallowing. He felt like over the last several days his swallowing has gotten much worse and he is unable to take most anything. He feels his weight has been relatively stable. He is admitted to the hospital after being extremely weak and passing out. He did not hit his head or have any seizures. He has been found to have acute renal failure as well as pneumonia, with a markedly elevated white blood cell count. He has been in the hospital now for the last 2 days, and has had some significant dysphagia, unable to get  a lot down by mouth. He had a modified barium swallow done today to evaluate his swallowing further.   PAST MEDICAL HISTORY: Significant for right tonsillar carcinoma, right knee surgery, COPD,  continued tobacco abuse and GERD.    ALLERGIES:  MORPHINE, which caused vomiting.  FAMILY HISTORY:  His father is living and healthy.   MEDICATIONS:  The current medications are reviewed.   SOCIAL HISTORY:  He still smokes about 3 or 4 cigarettes a day, sometimes more. He has been unable to stop smoking, even after his cancer treatment. He denies alcohol or illicit drug use.   PHYSICAL EXAMINATION: GENERAL: The patient is awake and alert. He looks a little thinner than normal. He was last seen in the office several weeks ago.   HEENT:  He has clear ears. The nose is open anteriorly. He has nasal cannulas in place. The oropharynx shows him to be edentulous. No sign of any oral lesions.  NECK:  Negative for any nodes or masses. He had his larynx  visualized  several weeks ago, with no evidence of any recurrence of cancer at that time.   DIAGNOSTIC DATA:  The modified barium swallow was done and reviewed, which shows a functioning upper esophageal sphincter such that further dilation of this probably will be of very little benefit. He has very poorly functioning pharyngeal constrictor muscles and very limited movement of his larynx, such that he shows silent aspiration with some nectar thick liquids and thin liquids, and should not be on any of these feedings.   IMPRESSION: The patient has a very dysfunctional hypopharynx and limited laryngeal movement secondary to radiation therapy in the area. He should only have pureed food and honey thickened liquids at this time. His upper esophageal sphincter is functional, and dilation of this would only provide a slightly bigger opening temporarily, which may help slightly, but not give him any significant long-term relief. This is a long-term problem that will continue to affect him, and I think he is a great candidate for a PEG tube to be able to provide the nutrition he wants and allow him to do small amount of feedings for pleasure. He does not have any evidence of recurrence of cancer at this  point, and will have continued followup in the office for that. He has relatively dry mucous membranes in his mouth and throat secondary to radiation changes. He needs humidified oxygen any time he gets it.       ____________________________ Huey Romans, MD phj:mr D: 10/19/2012 19:21:14 ET T: 10/19/2012 20:04:55 ET JOB#: [id number]  cc: Huey Romans, MD, <Dictator> Huey Romans MD ELECTRONICALLY SIGNED 10/22/2012 11:43

## 2014-12-08 NOTE — H&P (Signed)
PATIENT NAME:  Mark [name], Mark [name] MR#:  [id number] DATE OF BIRTH:  03-04-1956  DATE OF ADMISSION:  02/06/2013  PRIMARY CARE PHYSICIAN: Dr. Lennice Sites REFERRING PHYSICIAN:   Marjean Donna, MD   CHIEF COMPLAINT: Fever, chills this morning.   HISTORY OF PRESENT ILLNESS: The patient is a 59 year old Caucasian male with a history of recurrent aspiration pneumonia, tonsillar carcinoma status post PEG placement, COPD,  presented to the ED with the above chief complaint. The patient is alert, awake, oriented, in no acute distress. The patient and his wife said the patient was fine until this morning, and the patient developed a fever, chills, mild cough, but the patient denies any aspiration or choking, but the patient's wife said the patient has PEG  tube feeding, more residual recently.  The patient denies any headache, dizziness. No chest pain, palpitations, orthopnea, or nocturnal dyspnea. No shortness of breath or hemoptysis. The patient, though, was noted to have a low blood pressure to 82/53, was treated with normal saline bolus.  In addition, the patient's chest x-ray showed right side infiltration with suspect aspiration pneumonia and was treated with Zosyn by Dr. Michel Santee.   PAST MEDICAL HISTORY: Recurrent aspiration pneumonia, tonsillar carcinoma, COPD, GERD.  SURGICAL HISTORY: PEG tube placement, right knee surgery.   SOCIAL HISTORY: The patient is still smoking 1 pack a day for many years. Denies any smoking, denies any alcohol drinking or drug abuse.   FAMILY HISTORY: No, father is living and healthy.   ALLERGIES: MORPHINE.   HOME MEDICATIONS:  1.  Venlafaxine 37.5 mg p.o. b.i.d.  2.  Trazodone 150 mg p.o. at bedtime.  3.  Zocor 20 mg p.o. at bedtime.  4.  Oxycodone 30 mg p.o. 4 times a day p.r.n.  5.  Omeprazole 40 mg p.o. b.i.d.  6.  Gabapentin 600 mg p.o. 4 times a day.  7.  Clonazepam 1 mg p.o. t.i.d.  8.  Abilify 2 mg p.o. once a day.   REVIEW OF SYSTEMS:  CONSTITUTIONAL: The patient  has a fever, chills, headache, has no dizziness but has weakness.  EYES: No double vision, blurred vision.  ENT: No postnasal drip, slurred speech or dysphagia. No choking.  CARDIOVASCULAR: No chest pain, palpitation, orthopnea, or nocturnal dyspnea. No leg edema.  PULMONARY: Mild cough, no shortness of breath, wheezing or hematemesis.  GASTROINTESTINAL: No abdominal pain, nausea, vomiting or diarrhea. No melena or bloody stool.  GENITOURINARY: No dysuria, hematuria or incontinence.  SKIN: No rash or jaundice.  NEUROLOGY: No syncope, loss of consciousness or seizure.  HEMATOLOGY: No easy bruising or bleeding.  ENDOCRINE: No polyuria, polydipsia, heat or cold intolerance.   PHYSICAL EXAMINATION: VITAL SIGNS: Temperature 102.7, now decreased to 101.1, blood pressure was 82/52 and now increased to 93/50, pulse was 121 and now is 109, O2 saturation 94% on oxygen by nasal cannula 2 liters.  GENERAL: The patient is alert, awake, oriented, in no acute distress.  HEENT: Pupils are round, equal and reactive to light and accommodation. Dry oral mucosa. Clear oropharynx.  NECK: Supple. No JVD or carotid bruit. No lymphadenopathy. No thyromegaly.  CARDIOVASCULAR: S1, S2 regular rate and rhythm. No murmurs or gallops.  PULMONARY: Bilateral air entry, bilateral lung crackles. No wheezing. No use of accessory muscles to breathe.  ABDOMEN: Soft. No distention or tenderness. PEG tube in situ. No obvious organomegaly.  EXTREMITIES: No edema, clubbing or cyanosis. No calf tenderness. Bilateral strong pedal pulses.  SKIN: No rash or jaundice.  NEUROLOGY: A and O x 3.  No focal deficit. Power 5 out of 5. Sensation intact.  LABORATORY AND RADIOLOGICAL DATA: ABG showed pH 7.41, pCO2 49, pO2 71 with FiO2 32%. WBC 11.3, hemoglobin 12.3, platelets 197, glucose 129, BUN 18, creatinine 1.41, sodium 137, potassium 4.7, chloride 99, bicarbonate 33. Chest x-ray showed diffuse infiltrate with a right hemithorax and to a  lesser extent within the left.   IMPRESSIONS: 1.  Aspiration pneumonia.  2.  Systemic inflammatory response syndrome.  3.  Hypotension.  4.  Acute renal failure.  5.  History of tonsillar carcinoma.  6.  Diabetes.  7.  Tobacco abuse.   PLAN OF TREATMENT: 1.  The patient will be admitted to a medical floor. We will start Zosyn, Levaquin and follow-up CBC, blood culture, sputum culture and aspiration and fall precautions.  2.  We will keep n.p.o., give normal saline IV bolus, then if the patient's blood pressure is stable, change to 100 mL/h and follow up BMP.  3.  For diabetes, we will start a sliding scale, check hemoglobin A1c.  4.  For tobacco abuse, smoking cessation was counseled for 5 to 6 minutes. We will give a nicotine patch.   I discussed the patient's condition and plan of treatment with the patient and the patient's wife.  CODE STATUS:  The patient wants FULL CODE.   TIME SPENT: About 55 minutes.   ____________________________ Demetrios Loll, MD qc:cb D: 02/06/2013 13:20:52 ET T: 02/06/2013 14:38:01 ET JOB#: [id number]  cc: Demetrios Loll, MD, <Dictator> Demetrios Loll MD ELECTRONICALLY SIGNED 02/07/2013 17:54

## 2014-12-08 NOTE — Discharge Summary (Signed)
PATIENT NAME:  Mark [name], Mark [name] MR#:  [id number] DATE OF BIRTH:  1956-03-04  DATE OF ADMISSION:  02/06/2013 DATE OF DISCHARGE:  02/10/2013  DISCHARGE DISPOSITION:  Home.   HOME HEALTH:  Resume home health therapy for supplement equipment.   DIET: The patient has been told under his own risk, he can have puree and apple sauce diet with dysphagia precautions, no liquids, as he is at risk of aspiration.   FOLLOW UP:  In 2 to 4 weeks with ENT,  1 to 2 weeks with PCP Dr. Lennice Sites.  ENT, Dr. Kathyrn Sheriff.    DISCHARGE DIAGNOSES: 1.  Aspiration pneumonia.  2.  Acute respiratory failure.  3.  History of throat cancer.   MEDICATIONS AT DISCHARGE: Clonazepam 1 mg 3 times daily, Abilify 2 mg once daily, omeprazole 40 mg twice daily, trazodone 150 mg once daily, Jevity 1.5 kcal 360 mg 4 times a day via PEG tube, oxycodone 30 mg 4 times a day as needed, gabapentin 600 mg 4 times a day as needed. Venlafaxine 37.5 mg twice daily, simvastatin 20 mg once a day, prednisone 5 mg taper, starting with 40 mg, amoxicillin with clavulanic 1000 mg twice daily, free water 250 mg per tube. Please note that all this medication that the patient is getting today is to be given per tube.   Can resume home health.   IMPORTANT LABORATORY, DIAGNOSTIC AND RADIOLOGIC DATA:  Chest x-ray done on admission showed diffuse infiltrates within the right hemithorax with lesser extent to the left due to aspiration pneumonitis.   Repeat chest x-ray on the 24th showed bilateral pneumonia,  most prominent on the right upper lobe, some on the lower lobe with left perihilar involvement.   Glucose 210, creatinine 23, magnesium 1.6. White blood cells 17.5, hemoglobin is 10.   Blood cultures no growth on the second blood cultures. The first of blood cultures coagulase negative staphylococcus, which is likely due to contamination of the skin. Only one bottle was positive. ABG on admission: PH 7.4, pO2 71, pCO2 49.   White count on admission 11.3, at  discharge 17,000, creatinine 1.4 on admission, 1.07 at discharge, glucose 129 on admission, at discharge 210 and this is due to the steroids.   We are going to allow the patient to have steroids for 6 more days, for what his blood sugar might be elevated, but this is going to be temporary.    HOSPITAL COURSE: Mark [name] is a very nice 59 year old gentleman. Please refer to H and P for more details. The patient is admitted on 02/06/2013 with a history of recurrent aspiration pneumonia, tonsillar carcinoma status post PEG placement, chronic obstructive pulmonary disease. The patient was alert and awake. He is not complaining with diet. Apparently he drinks liquids all the time.  He developed fever, chills, mild cough after evidence of aspiration. The patient was noted to have low blood pressure in the ER, 82/53.  The patient in the beginning said that he did not aspirate, but then after further conversation, told me that he was just drinking Pepsi and he felt when it went down to his respiratory pathway. The patient was admitted, treated with Zosyn and Levaquin, although this is clear aspiration pneumonia for what we are going to discharge him on Augmentin. The patient had sputum cultures that were not able to be obtained. He actually was requiring significant amounts of oxygen, up to 5 liters for what we tried to wean him off. He was wheezing and having some crepitus  with bilateral pulmonary infiltrate, for which the patient was given steroids and he started getting better. He also got one dose of Lasix, but this was not repeated. The patient had a BNP of 1900 and the Lasix seemed to help a little bit, the fluid overload. There was no significant findings of sepsis during this hospitalization. He has elevation of his white blood cells, which is due to the steroids elevation of glucose, which is due to the steroids. As far as his bacteremia with gram-positive cocci, there was a contaminant only one bottle. As far  as his chronic obstructive pulmonary disease, is better now with steroids. As far as his throat cancer, it is in remission. He has dysphagia for which he has been seen by his speech therapy. Speech therapy has recommended nothing oral at the beginning, but then have recommended in the future he could started getting dysphagia diet, for which the patient is going to be discharged home on pureed diet. He is aware that this is not going to prevent all the  aspiration. He needs to have precautions and he said that in the past and he is going to eat anyway, since he is noncompliant with this.    TIME SPENT: I spent about 45 minutes with this discharge.   ____________________________ Allport Sink, MD rsg:cc D: 02/10/2013 13:18:06 ET T: 02/10/2013 14:34:48 ET JOB#: [id number]  cc: Olds Sink, MD, <Dictator> Dr. Loma Messing, MD   Cristi Loron MD ELECTRONICALLY SIGNED 02/16/2013 15:02

## 2014-12-08 NOTE — Consult Note (Signed)
Chief Complaint:  Subjective/Chief Complaint patient eating some edentulous type diet, denies choking.   VITAL SIGNS/ANCILLARY NOTES: **Vital Signs.:   05-Mar-14 13:49  Vital Signs Type Routine  Temperature Temperature (F) 98.4  Celsius 36.8  Temperature Source oral  Pulse Pulse 85  Respirations Respirations 18  Systolic BP Systolic BP 88  Diastolic BP (mmHg) Diastolic BP (mmHg) 56  Mean BP 66  Pulse Ox % Pulse Ox % 92  Pulse Ox Activity Level  At rest  Oxygen Delivery 3L   Brief Assessment:  Cardiac Regular   Respiratory clear BS   Gastrointestinal details normal Soft  Nontender  Nondistended  No masses palpable  Bowel sounds normal   Lab Results: Routine Micro:  04-Mar-14 09:33   Micro Text Report BLOOD CULTURE   COMMENT                   NO GROWTH IN 18-24 HOURS   ANTIBIOTIC                       Specimen Source left hand  Culture Comment NO GROWTH IN 18-24 HOURS  Result(s) reported on 20 Oct 2012 at 06:40AM.    09:54   Micro Text Report BLOOD CULTURE   COMMENT                   NO GROWTH IN 18-24 HOURS   ANTIBIOTIC                       Specimen Source right hand  Culture Comment NO GROWTH IN 18-24 HOURS  Result(s) reported on 20 Oct 2012 at 06:41AM.  Cardiology:  04-Mar-14 01:29   Ventricular Rate 122  Atrial Rate 122  P-R Interval 144  QRS Duration 76  QT 298  QTc 424  P Axis 78  R Axis 76  T Axis 37  ECG interpretation Sinus tachycardia Nonspecific T wave abnormality Abnormal ECG When compared with ECG of 17-Oct-2012 15:25, ST no longer elevated in Inferior leads Confirmed by [facility], KEN ([id number]) on 10/20/2012 4:11:29 PM  Overreader: Ubaldo Glassing, KEN  Routine Hem:  04-Mar-14 05:49   WBC (CBC)  17.5  RBC (CBC)  3.80  Hemoglobin (CBC)  10.9  Hematocrit (CBC)  33.5  Platelet Count (CBC) 198  MCV 88  MCH 28.7  MCHC 32.6  RDW  14.6  Neutrophil % 92.9  Lymphocyte % 4.2  Monocyte % 2.4  Eosinophil % 0.4  Basophil % 0.1  Neutrophil #  16.2   Lymphocyte #  0.7  Monocyte # 0.4  Eosinophil # 0.1  Basophil # 0.0 (Result(s) reported on 19 Oct 2012 at [facility].)   Radiology Results: XRay:    04-Mar-14 11:49, Chest PA and Lateral  Chest PA and Lateral   REASON FOR EXAM:    eval the pna  COMMENTS:       PROCEDURE: DXR - DXR CHEST PA (OR AP) AND LATERAL  - Oct 19 2012 11:49AM     RESULT: Comparison: 10/17/2012    Findings:     PA and lateral chest radiographs are provided. There is bilateral diffuse   interstitial thickening likely representing interstitial edema versus   interstitial pneumonitis secondary to an infectious or inflammatory   etiology. There is left basilar atelectasis. There is no pleural effusion   or pneumothorax. The heart and mediastinum are unremarkable.  The osseous   structures are unremarkable.  IMPRESSION:     There  is bilateral diffuse interstitial thickening likely representing   interstitial edema versus interstitial pneumonitis secondary to an   infectious or inflammatory etiology. There is interval improvement of the   right lower lobe consolidation.    Dictation Site: 1        Verified By: Jennette Banker, M.D., MD   Assessment/Plan:  Assessment/Plan:  Assessment 1) oropharyngeal dysphagia in the setting of history of SCCa throat with surgery and radiation treatment.  2) recurrent aspiration pneumonia   Plan 1) agree with consideration of feeding tube to avoid oral feeds.  Discussed with Dr Bridgette Habermann and Dr Rexene Edison.  J-tube may be better for decreasing aspiration risk than G-tube.  Following, awaiting surgery evaluation.   Electronic Signatures: Loistine Simas (MD)  (Signed 05-Mar-14 18:10)  Authored: Chief Complaint, VITAL SIGNS/ANCILLARY NOTES, Brief Assessment, Lab Results, Radiology Results, Assessment/Plan   Last Updated: 05-Mar-14 18:10 by Loistine Simas (MD)

## 2015-03-01 ENCOUNTER — Other Ambulatory Visit: Payer: Self-pay

## 2015-03-01 ENCOUNTER — Inpatient Hospital Stay: Payer: Medicaid Other | Attending: Oncology

## 2015-03-01 ENCOUNTER — Inpatient Hospital Stay (HOSPITAL_BASED_OUTPATIENT_CLINIC_OR_DEPARTMENT_OTHER): Payer: Medicaid Other | Admitting: Oncology

## 2015-03-01 VITALS — BP 88/61 | HR 76 | Temp 97.1°F | Wt 152.8 lb

## 2015-03-01 DIAGNOSIS — G629 Polyneuropathy, unspecified: Secondary | ICD-10-CM

## 2015-03-01 DIAGNOSIS — Z931 Gastrostomy status: Secondary | ICD-10-CM | POA: Insufficient documentation

## 2015-03-01 DIAGNOSIS — Z923 Personal history of irradiation: Secondary | ICD-10-CM

## 2015-03-01 DIAGNOSIS — C099 Malignant neoplasm of tonsil, unspecified: Secondary | ICD-10-CM

## 2015-03-01 DIAGNOSIS — Z79899 Other long term (current) drug therapy: Secondary | ICD-10-CM

## 2015-03-01 DIAGNOSIS — Z85818 Personal history of malignant neoplasm of other sites of lip, oral cavity, and pharynx: Secondary | ICD-10-CM | POA: Insufficient documentation

## 2015-03-01 DIAGNOSIS — C760 Malignant neoplasm of head, face and neck: Secondary | ICD-10-CM

## 2015-03-01 LAB — COMPREHENSIVE METABOLIC PANEL
ALBUMIN: 4.2 g/dL (ref 3.5–5.0)
ALK PHOS: 104 U/L (ref 38–126)
ALT: 14 U/L — ABNORMAL LOW (ref 17–63)
AST: 21 U/L (ref 15–41)
Anion gap: 5 (ref 5–15)
BILIRUBIN TOTAL: 0.5 mg/dL (ref 0.3–1.2)
BUN: 28 mg/dL — AB (ref 6–20)
CALCIUM: 8.4 mg/dL — AB (ref 8.9–10.3)
CO2: 31 mmol/L (ref 22–32)
CREATININE: 0.87 mg/dL (ref 0.61–1.24)
Chloride: 97 mmol/L — ABNORMAL LOW (ref 101–111)
Glucose, Bld: 175 mg/dL — ABNORMAL HIGH (ref 65–99)
Potassium: 4.4 mmol/L (ref 3.5–5.1)
Sodium: 133 mmol/L — ABNORMAL LOW (ref 135–145)
Total Protein: 7 g/dL (ref 6.5–8.1)

## 2015-03-01 LAB — CBC
HCT: 37.3 % — ABNORMAL LOW (ref 40.0–52.0)
Hemoglobin: 12.1 g/dL — ABNORMAL LOW (ref 13.0–18.0)
MCH: 29.2 pg (ref 26.0–34.0)
MCHC: 32.6 g/dL (ref 32.0–36.0)
MCV: 89.7 fL (ref 80.0–100.0)
Platelets: 155 10*3/uL (ref 150–440)
RBC: 4.15 MIL/uL — ABNORMAL LOW (ref 4.40–5.90)
RDW: 14.8 % — AB (ref 11.5–14.5)
WBC: 6.5 10*3/uL (ref 3.8–10.6)

## 2015-03-17 NOTE — Progress Notes (Signed)
[place]  Telephone:([phone]) [phone] Fax:(336) [phone]  ID: Mark [name] OB: [birth year]  MR#: [id number]  JAS#:[id number]  Patient Care Team: Secundino Ginger, PA-C as PCP - General (Cardiology)  CHIEF COMPLAINT:  Chief Complaint  Patient presents with  . Follow-up  . Cancer    INTERVAL HISTORY: Patient returns to clinic today for routine 6 month evaluation.  He continues to require much of his nutrition via PEG tube. He states his oral intake has improved, but he still aspirates.  He otherwise feels well and is asymptomatic.  He denies any dysphasia.  He continues to have dry mouth.  He has no neurologic complaints.  He denies any chest pain or shortness of breath.  He has no nausea, vomiting, constipation, or diarrhea.  He has no urinary complaints.  Patient offers no further specific complaints.  REVIEW OF SYSTEMS:   Review of Systems  Constitutional: Negative for weight loss and malaise/fatigue.  HENT: Negative.   Respiratory: Negative.   Cardiovascular: Negative.   Gastrointestinal: Negative.   Neurological: Negative for weakness.    As per HPI. Otherwise, a complete review of systems is negatve.  PAST MEDICAL HISTORY: Past Medical History  Diagnosis Date  . Cancer     tonsilal ca  . Aspiration pneumonia     PAST SURGICAL HISTORY: Past Surgical History  Procedure Laterality Date  . Appendectomy    . Cholecystectomy    . Tonsillectomy      jan 2010--stage IV    FAMILY HISTORY Family History  Problem Relation Age of Onset  . Liver cancer Mother   . Stroke Father   . Testicular cancer Father        ADVANCED DIRECTIVES:    HEALTH MAINTENANCE: History  Substance Use Topics  . Smoking status: Current Every Day Smoker  . Smokeless tobacco: Not on file  . Alcohol Use: No     Colonoscopy:  PAP:  Bone density:  Lipid panel:  Allergies  Allergen Reactions  . Morphine And Related Nausea And Vomiting    Current Outpatient  Prescriptions  Medication Sig Dispense Refill  . albuterol (PROVENTIL HFA;VENTOLIN HFA) 108 (90 BASE) MCG/ACT inhaler Inhale 2 puffs into the lungs every 6 (six) hours as needed for wheezing or shortness of breath. ProAir    . ARIPiprazole (ABILIFY) 2 MG tablet 2 mg by PEG Tube route daily.     Marland Kitchen aspirin EC 81 MG tablet 81 mg by PEG Tube route daily.    . budesonide-formoterol (SYMBICORT) 160-4.5 MCG/ACT inhaler Inhale 2 puffs into the lungs 2 (two) times daily.    Marland Kitchen buPROPion (WELLBUTRIN SR) 150 MG 12 hr tablet 150 mg by PEG Tube route 2 (two) times daily.    . clonazePAM (KLONOPIN) 1 MG tablet 1 mg by PEG Tube route 3 (three) times daily as needed for anxiety.     . Ipratropium-Albuterol (COMBIVENT) 20-100 MCG/ACT AERS respimat Inhale 2 puffs into the lungs 2 (two) times daily.    . mometasone (NASONEX) 50 MCG/ACT nasal spray Place 2 sprays into both nostrils 2 (two) times daily.    . Nutritional Supplements (GLUCERNA 1.5 CAL PO) 237-474 mLs by PEG Tube route See admin instructions. Take 2 cans (474 mls) as a meal substitute breakfast, lunch and dinner; take 1 can (237 mls) as a snack mid afternoon    . oxycodone (ROXICODONE) 30 MG immediate release tablet 30 mg by PEG Tube route 4 (four) times daily.    . pantoprazole (PROTONIX) 40  MG tablet 40 mg by PEG Tube route 2 (two) times daily.    . pregabalin (LYRICA) 100 MG capsule Take 100 mg by mouth 2 (two) times daily.    . simvastatin (ZOCOR) 20 MG tablet 20 mg by PEG Tube route at bedtime.     . traZODone (DESYREL) 150 MG tablet 150 mg by PEG Tube route at bedtime.    Marland Kitchen venlafaxine (EFFEXOR) 37.5 MG tablet 37.5 mg by PEG Tube route 2 (two) times daily.     No current facility-administered medications for this visit.    OBJECTIVE: Filed Vitals:   03/01/15 1127  BP: 88/61  Pulse: 76  Temp: 97.1 F (36.2 C)     Body mass index is 20.72 kg/(m^2).    ECOG FS:0 - Asymptomatic  General: Well-developed, well-nourished, no acute  distress. Eyes: Pink conjunctiva, anicteric sclera. HEENT: Normocephalic, moist mucous membranes, clear oropharnyx. No palpable lymphadenopathy. Lungs: Clear to auscultation bilaterally. Heart: Regular rate and rhythm. No rubs, murmurs, or gallops. Abdomen: Soft, nontender, nondistended. No organomegaly noted, normoactive bowel sounds. PEG tube noted without erythema. Musculoskeletal: No edema, cyanosis, or clubbing. Neuro: Alert, answering all questions appropriately. Cranial nerves grossly intact. Skin: No rashes or petechiae noted. Psych: Normal affect.   LAB RESULTS:  Lab Results  Component Value Date   NA 133* 03/01/2015   K 4.4 03/01/2015   CL 97* 03/01/2015   CO2 31 03/01/2015   GLUCOSE 175* 03/01/2015   BUN 28* 03/01/2015   CREATININE 0.87 03/01/2015   CALCIUM 8.4* 03/01/2015   PROT 7.0 03/01/2015   ALBUMIN 4.2 03/01/2015   AST 21 03/01/2015   ALT 14* 03/01/2015   ALKPHOS 104 03/01/2015   BILITOT 0.5 03/01/2015   GFRNONAA >60 03/01/2015   GFRAA >60 03/01/2015    Lab Results  Component Value Date   WBC 6.5 03/01/2015   NEUTROABS 8.7* 11/20/2014   HGB 12.1* 03/01/2015   HCT 37.3* 03/01/2015   MCV 89.7 03/01/2015   PLT 155 03/01/2015     STUDIES: No results found.  ASSESSMENT: Stage IVa, squamous cell carcinoma of left tonsil.  PLAN:    1. SCC:  No evidence of disease.  Patient reports recent endoscopy by ENT was normal.  Patient is now greater than 2 years from completing his treatment which was in June 2010, therefore no further imaging is necessary.  Return to clinic in 6 months for routine evaluation and laboratory work at which point can consider discharging patient from clinic. 2.  Neuropathy:  Continue Lyrica 3.  Depression/anxiety: Treatment per behavior health. 4.  Aspiration: Continue to use PEG tube as directed. Oral intake per speech pathology.    Patient expressed understanding and was in agreement with this plan. He also understands that  He can call clinic at any time with any questions, concerns, or complaints.   Tonsillar cancer, S/P surgery, chemotherapy XRT 2009-followed at [facility] form: Pharynx - Oropharynx, AJCC 7th Edition     Clinical stage from 03/17/2015: Stage IVA (T4a, N2, M0) - Signed by Lloyd Huger, MD on 03/17/2015   Lloyd Huger, MD   03/17/2015 2:34 PM

## 2015-03-26 ENCOUNTER — Encounter: Payer: Self-pay | Admitting: Gastroenterology

## 2015-03-28 ENCOUNTER — Ambulatory Visit (INDEPENDENT_AMBULATORY_CARE_PROVIDER_SITE_OTHER): Payer: Medicaid Other | Admitting: Gastroenterology

## 2015-03-28 VITALS — BP 74/52 | HR 71 | Temp 98.0°F | Ht 72.0 in | Wt 156.0 lb

## 2015-03-28 DIAGNOSIS — K9423 Gastrostomy malfunction: Secondary | ICD-10-CM | POA: Diagnosis not present

## 2015-03-28 NOTE — Telephone Encounter (Signed)
error 

## 2015-03-28 NOTE — Progress Notes (Signed)
Primary Care Physician: Isaias Cowman, PA-C  Primary Gastroenterologist:  Dr. [name]  Chief Complaint  Patient presents with  . Recheck peg tube    HPI: Mark [name] is a 59 y.o. male here leaking PEG tube. The patient states that he has been able to feed well with the PEG tube and there has been some leakage of some material including some black material. The patient also states he flushes his PEG tube with vinegar after he drinks coffee so the coffee does not clog up the feeding tube.  Current Outpatient Prescriptions  Medication Sig Dispense Refill  . albuterol (PROVENTIL HFA;VENTOLIN HFA) 108 (90 BASE) MCG/ACT inhaler Inhale 2 puffs into the lungs every 6 (six) hours as needed for wheezing or shortness of breath. ProAir    . ARIPiprazole (ABILIFY) 2 MG tablet 2 mg by PEG Tube route daily.     Marland Kitchen aspirin EC 81 MG tablet 81 mg by PEG Tube route daily.    . budesonide-formoterol (SYMBICORT) 160-4.5 MCG/ACT inhaler Inhale 2 puffs into the lungs 2 (two) times daily.    Marland Kitchen buPROPion (WELLBUTRIN SR) 150 MG 12 hr tablet 150 mg by PEG Tube route 2 (two) times daily.    . clonazePAM (KLONOPIN) 1 MG tablet 1 mg by PEG Tube route 3 (three) times daily as needed for anxiety.     . Ipratropium-Albuterol (COMBIVENT) 20-100 MCG/ACT AERS respimat Inhale 2 puffs into the lungs 2 (two) times daily.    . mometasone (NASONEX) 50 MCG/ACT nasal spray Place 2 sprays into both nostrils 2 (two) times daily.    . Nutritional Supplements (GLUCERNA 1.5 CAL PO) 237-474 mLs by PEG Tube route See admin instructions. Take 2 cans (474 mls) as a meal substitute breakfast, lunch and dinner; take 1 can (237 mls) as a snack mid afternoon    . oxycodone (ROXICODONE) 30 MG immediate release tablet 30 mg by PEG Tube route 4 (four) times daily.    . pantoprazole (PROTONIX) 40 MG tablet 40 mg by PEG Tube route 2 (two) times daily.    . pregabalin (LYRICA) 100 MG capsule Take 100 mg by mouth 2 (two) times daily.    .  simvastatin (ZOCOR) 20 MG tablet 20 mg by PEG Tube route at bedtime.     . traZODone (DESYREL) 150 MG tablet 150 mg by PEG Tube route at bedtime.    Marland Kitchen venlafaxine (EFFEXOR) 37.5 MG tablet 37.5 mg by PEG Tube route 2 (two) times daily.     No current facility-administered medications for this visit.    Allergies as of 03/28/2015 - Review Complete 03/28/2015  Allergen Reaction Noted  . Morphine and related Nausea And Vomiting 11/18/2014    ROS:  General: Negative for anorexia, weight loss, fever, chills, fatigue, weakness. ENT: Negative for hoarseness, difficulty swallowing , nasal congestion. CV: Negative for chest pain, angina, palpitations, dyspnea on exertion, peripheral edema.  Respiratory: Negative for dyspnea at rest, dyspnea on exertion, cough, sputum, wheezing.  GI: See history of present illness. GU:  Negative for dysuria, hematuria, urinary incontinence, urinary frequency, nocturnal urination.  Endo: Negative for unusual weight change.    Physical Examination:   BP 74/52 mmHg  Pulse 71  Temp(Src) 98 F (36.7 C) (Oral)  Ht 6' (1.829 m)  Wt 156 lb (70.761 kg)  BMI 21.15 kg/m2  General: Well-nourished, well-developed in no acute distress.  Eyes: No icterus. Conjunctivae pink. Abdomen: PEG tube in place with some erythema around the PEG site but no  sign of infection. There is no leakage of any black material. The area did not show any signs of bleeding. Extremities: No lower extremity edema. No clubbing or deformities. Skin: Warm and dry, no jaundice.   Psych: Alert and cooperative, normal mood and affect.  Labs:    Imaging Studies: No results found.  Assessment and Plan:   Mark [name] is a 59 y.o. y/o male comes in with a leaking PEG tube. The area looked good and although the erythema was likely contact otitis there is no sign of infection or bleeding. The patient had some increased tension put on the PEG tube with some gauze pads placed on the PEG tube the  patient has been told to some bacitracin around the area and to contact me if the symptoms get any worse. Patient and his wife have been explained the LAD and agree with it.   Note: This dictation was prepared with Dragon dictation along with smaller phrase technology. Any transcriptional errors that result from this process are unintentional.

## 2015-04-23 ENCOUNTER — Inpatient Hospital Stay
Admission: EM | Admit: 2015-04-23 | Discharge: 2015-04-25 | DRG: 871 | Disposition: A | Discharge status: Home / Self Care | Payer: Medicaid Other | Attending: Internal Medicine | Admitting: Internal Medicine

## 2015-04-23 ENCOUNTER — Encounter: Payer: Self-pay | Admitting: *Deleted

## 2015-04-23 ENCOUNTER — Emergency Department: Payer: Medicaid Other

## 2015-04-23 DIAGNOSIS — Z800 Family history of malignant neoplasm of digestive organs: Secondary | ICD-10-CM

## 2015-04-23 DIAGNOSIS — R32 Unspecified urinary incontinence: Secondary | ICD-10-CM | POA: Diagnosis present

## 2015-04-23 DIAGNOSIS — R1310 Dysphagia, unspecified: Secondary | ICD-10-CM | POA: Diagnosis present

## 2015-04-23 DIAGNOSIS — Z823 Family history of stroke: Secondary | ICD-10-CM

## 2015-04-23 DIAGNOSIS — A419 Sepsis, unspecified organism: Principal | ICD-10-CM | POA: Diagnosis present

## 2015-04-23 DIAGNOSIS — F418 Other specified anxiety disorders: Secondary | ICD-10-CM | POA: Diagnosis present

## 2015-04-23 DIAGNOSIS — Z9221 Personal history of antineoplastic chemotherapy: Secondary | ICD-10-CM | POA: Diagnosis not present

## 2015-04-23 DIAGNOSIS — K219 Gastro-esophageal reflux disease without esophagitis: Secondary | ICD-10-CM | POA: Diagnosis present

## 2015-04-23 DIAGNOSIS — Z8043 Family history of malignant neoplasm of testis: Secondary | ICD-10-CM

## 2015-04-23 DIAGNOSIS — G629 Polyneuropathy, unspecified: Secondary | ICD-10-CM | POA: Diagnosis present

## 2015-04-23 DIAGNOSIS — J449 Chronic obstructive pulmonary disease, unspecified: Secondary | ICD-10-CM | POA: Diagnosis present

## 2015-04-23 DIAGNOSIS — Z923 Personal history of irradiation: Secondary | ICD-10-CM

## 2015-04-23 DIAGNOSIS — Z885 Allergy status to narcotic agent status: Secondary | ICD-10-CM

## 2015-04-23 DIAGNOSIS — Z9889 Other specified postprocedural states: Secondary | ICD-10-CM | POA: Diagnosis not present

## 2015-04-23 DIAGNOSIS — F17210 Nicotine dependence, cigarettes, uncomplicated: Secondary | ICD-10-CM | POA: Diagnosis present

## 2015-04-23 DIAGNOSIS — Z9049 Acquired absence of other specified parts of digestive tract: Secondary | ICD-10-CM | POA: Diagnosis present

## 2015-04-23 DIAGNOSIS — Z79899 Other long term (current) drug therapy: Secondary | ICD-10-CM | POA: Diagnosis not present

## 2015-04-23 DIAGNOSIS — Z716 Tobacco abuse counseling: Secondary | ICD-10-CM | POA: Diagnosis not present

## 2015-04-23 DIAGNOSIS — Z85818 Personal history of malignant neoplasm of other sites of lip, oral cavity, and pharynx: Secondary | ICD-10-CM

## 2015-04-23 DIAGNOSIS — J690 Pneumonitis due to inhalation of food and vomit: Secondary | ICD-10-CM | POA: Diagnosis not present

## 2015-04-23 DIAGNOSIS — Z7982 Long term (current) use of aspirin: Secondary | ICD-10-CM | POA: Diagnosis not present

## 2015-04-23 DIAGNOSIS — R6520 Severe sepsis without septic shock: Secondary | ICD-10-CM | POA: Diagnosis present

## 2015-04-23 HISTORY — DX: Unspecified urinary incontinence: R32

## 2015-04-23 LAB — CBC WITH DIFFERENTIAL/PLATELET
Basophils Absolute: 0 K/uL (ref 0–0.1)
Basophils Relative: 0 %
Eosinophils Absolute: 0 K/uL (ref 0–0.7)
Eosinophils Relative: 0 %
HCT: 38.5 % — ABNORMAL LOW (ref 40.0–52.0)
Hemoglobin: 12.5 g/dL — ABNORMAL LOW (ref 13.0–18.0)
Lymphocytes Relative: 3 %
Lymphs Abs: 0.4 K/uL — ABNORMAL LOW (ref 1.0–3.6)
MCH: 29 pg (ref 26.0–34.0)
MCHC: 32.5 g/dL (ref 32.0–36.0)
MCV: 89 fL (ref 80.0–100.0)
Monocytes Absolute: 0.9 K/uL (ref 0.2–1.0)
Monocytes Relative: 5 %
Neutro Abs: 15.9 K/uL — ABNORMAL HIGH (ref 1.4–6.5)
Neutrophils Relative %: 92 %
Platelets: 149 K/uL — ABNORMAL LOW (ref 150–440)
RBC: 4.32 MIL/uL — ABNORMAL LOW (ref 4.40–5.90)
RDW: 14.6 % — ABNORMAL HIGH (ref 11.5–14.5)
WBC: 17.3 K/uL — ABNORMAL HIGH (ref 3.8–10.6)

## 2015-04-23 LAB — URINALYSIS COMPLETE WITH MICROSCOPIC (ARMC ONLY)
BILIRUBIN URINE: NEGATIVE
Bacteria, UA: NONE SEEN
GLUCOSE, UA: NEGATIVE mg/dL
Hgb urine dipstick: NEGATIVE
Ketones, ur: NEGATIVE mg/dL
Leukocytes, UA: NEGATIVE
Nitrite: NEGATIVE
PH: 8 (ref 5.0–8.0)
Protein, ur: 100 mg/dL — AB
Specific Gravity, Urine: 1.02 (ref 1.005–1.030)

## 2015-04-23 LAB — COMPREHENSIVE METABOLIC PANEL
ALBUMIN: 4.1 g/dL (ref 3.5–5.0)
ALK PHOS: 100 U/L (ref 38–126)
ALT: 16 U/L — AB (ref 17–63)
AST: 24 U/L (ref 15–41)
Anion gap: 9 (ref 5–15)
BUN: 20 mg/dL (ref 6–20)
CALCIUM: 9 mg/dL (ref 8.9–10.3)
CO2: 31 mmol/L (ref 22–32)
CREATININE: 0.82 mg/dL (ref 0.61–1.24)
Chloride: 97 mmol/L — ABNORMAL LOW (ref 101–111)
GFR calc non Af Amer: >60 mL/min (ref >60)
GLUCOSE: 190 mg/dL — AB (ref 65–99)
Potassium: 4.5 mmol/L (ref 3.5–5.1)
SODIUM: 137 mmol/L (ref 135–145)
Total Bilirubin: 0.6 mg/dL (ref 0.3–1.2)
Total Protein: 7.5 g/dL (ref 6.5–8.1)

## 2015-04-23 LAB — LACTIC ACID, PLASMA
LACTIC ACID, VENOUS: 0.8 mmol/L (ref 0.5–2.0)
Lactic Acid, Venous: 1.3 mmol/L (ref 0.5–2.0)

## 2015-04-23 MED ORDER — ACETAMINOPHEN 325 MG PO TABS
650.0000 mg | ORAL_TABLET | Freq: Four times a day (QID) | ORAL | Status: DC | PRN
  Filled 2015-04-23 (×2): qty 2

## 2015-04-23 MED ORDER — PANTOPRAZOLE SODIUM 40 MG PO PACK
40.0000 mg | PACK | Freq: Every day | ORAL | Status: DC
  Administered 2015-04-23 – 2015-04-24 (×2): 40 mg
  Filled 2015-04-23 (×2): qty 20

## 2015-04-23 MED ORDER — CIPROFLOXACIN IN D5W 400 MG/200ML IV SOLN: 400.0000 mg | Freq: Once | INTRAVENOUS | Status: DC

## 2015-04-23 MED ORDER — PIPERACILLIN-TAZOBACTAM 3.375 G IVPB
3.3750 g | Freq: Once | INTRAVENOUS | Status: AC
  Administered 2015-04-23: 3.375 g via INTRAVENOUS
  Filled 2015-04-23: qty 50

## 2015-04-23 MED ORDER — CLINDAMYCIN PHOSPHATE 600 MG/50ML IV SOLN: 600.0000 mg | Freq: Once | INTRAVENOUS | Status: DC

## 2015-04-23 MED ORDER — VENLAFAXINE HCL 37.5 MG PO TABS
37.5000 mg | ORAL_TABLET | Freq: Two times a day (BID) | ORAL | Status: DC
  Administered 2015-04-23 – 2015-04-25 (×4): 37.5 mg
  Filled 2015-04-23 (×9): qty 1

## 2015-04-23 MED ORDER — PANTOPRAZOLE SODIUM 40 MG PO TBEC: 40.0000 mg | DELAYED_RELEASE_TABLET | Freq: Every day | ORAL | Status: DC

## 2015-04-23 MED ORDER — SODIUM CHLORIDE 0.9 % IV BOLUS (SEPSIS)
500.0000 mL | Freq: Once | INTRAVENOUS | Status: AC
  Administered 2015-04-23: 22:00:00 500 mL via INTRAVENOUS

## 2015-04-23 MED ORDER — ARIPIPRAZOLE 2 MG PO TABS
2.0000 mg | ORAL_TABLET | Freq: Every day | ORAL | Status: DC
  Administered 2015-04-23 – 2015-04-25 (×3): 2 mg
  Filled 2015-04-23 (×3): qty 1

## 2015-04-23 MED ORDER — SODIUM CHLORIDE 0.9 % IV SOLN
INTRAVENOUS | Status: DC
  Administered 2015-04-23: 13:00:00 via INTRAVENOUS

## 2015-04-23 MED ORDER — ALBUTEROL SULFATE (2.5 MG/3ML) 0.083% IN NEBU: 3.0000 mL | INHALATION_SOLUTION | Freq: Four times a day (QID) | RESPIRATORY_TRACT | Status: DC | PRN

## 2015-04-23 MED ORDER — SODIUM CHLORIDE 0.9 % IV SOLN
INTRAVENOUS | Status: DC
  Administered 2015-04-23 – 2015-04-24 (×2): via INTRAVENOUS

## 2015-04-23 MED ORDER — GLUCERNA 1.5 CAL PO LIQD: 237.0000 mL | Freq: Two times a day (BID) | ORAL | Status: DC

## 2015-04-23 MED ORDER — IPRATROPIUM-ALBUTEROL 0.5-2.5 (3) MG/3ML IN SOLN
3.0000 mL | Freq: Two times a day (BID) | RESPIRATORY_TRACT | Status: DC
  Administered 2015-04-23 – 2015-04-25 (×4): 3 mL via RESPIRATORY_TRACT
  Filled 2015-04-23 (×5): qty 3

## 2015-04-23 MED ORDER — NICOTINE 21 MG/24HR TD PT24
21.0000 mg | MEDICATED_PATCH | Freq: Every day | TRANSDERMAL | Status: DC
  Administered 2015-04-23 – 2015-04-25 (×3): 21 mg via TRANSDERMAL
  Filled 2015-04-23 (×3): qty 1

## 2015-04-23 MED ORDER — BUDESONIDE-FORMOTEROL FUMARATE 160-4.5 MCG/ACT IN AERO
2.0000 | INHALATION_SPRAY | Freq: Two times a day (BID) | RESPIRATORY_TRACT | Status: DC
  Administered 2015-04-24 – 2015-04-25 (×3): 2 via RESPIRATORY_TRACT
  Filled 2015-04-23: qty 6

## 2015-04-23 MED ORDER — TRAZODONE HCL 50 MG PO TABS
150.0000 mg | ORAL_TABLET | Freq: Every day | ORAL | Status: DC
  Administered 2015-04-23 – 2015-04-24 (×2): 150 mg
  Filled 2015-04-23 (×2): qty 1

## 2015-04-23 MED ORDER — SODIUM CHLORIDE 0.9 % IV BOLUS (SEPSIS)
1000.0000 mL | Freq: Once | INTRAVENOUS | Status: AC
  Administered 2015-04-23: 1000 mL via INTRAVENOUS

## 2015-04-23 MED ORDER — ACETAMINOPHEN 650 MG RE SUPP: 650.0000 mg | Freq: Four times a day (QID) | RECTAL | Status: DC | PRN

## 2015-04-23 MED ORDER — BUPROPION HCL ER (SR) 150 MG PO TB12
150.0000 mg | ORAL_TABLET | Freq: Two times a day (BID) | ORAL | Status: DC
  Administered 2015-04-23 – 2015-04-25 (×4): 150 mg via ORAL
  Filled 2015-04-23 (×5): qty 1

## 2015-04-23 MED ORDER — ASPIRIN EC 81 MG PO TBEC
81.0000 mg | DELAYED_RELEASE_TABLET | Freq: Every day | ORAL | Status: DC
  Administered 2015-04-23 – 2015-04-25 (×3): 81 mg via ORAL
  Filled 2015-04-23 (×3): qty 1

## 2015-04-23 MED ORDER — FLUTICASONE PROPIONATE 50 MCG/ACT NA SUSP
2.0000 | Freq: Every day | NASAL | Status: DC
  Administered 2015-04-23 – 2015-04-25 (×3): 2 via NASAL
  Filled 2015-04-23: qty 16

## 2015-04-23 MED ORDER — PIPERACILLIN-TAZOBACTAM 3.375 G IVPB
3.3750 g | Freq: Three times a day (TID) | INTRAVENOUS | Status: DC
  Administered 2015-04-23 – 2015-04-25 (×5): 3.375 g via INTRAVENOUS
  Filled 2015-04-23 (×9): qty 50

## 2015-04-23 MED ORDER — ENOXAPARIN SODIUM 40 MG/0.4ML ~~LOC~~ SOLN
40.0000 mg | SUBCUTANEOUS | Status: DC
  Administered 2015-04-23 – 2015-04-24 (×2): 40 mg via SUBCUTANEOUS
  Filled 2015-04-23 (×2): qty 0.4

## 2015-04-23 MED ORDER — PREGABALIN 75 MG PO CAPS
300.0000 mg | ORAL_CAPSULE | Freq: Two times a day (BID) | ORAL | Status: DC
  Administered 2015-04-23 – 2015-04-25 (×4): 300 mg via ORAL
  Filled 2015-04-23 (×5): qty 4

## 2015-04-23 MED ORDER — GLUCERNA 1.5 CAL PO LIQD
474.0000 mL | Freq: Three times a day (TID) | ORAL | Status: DC
  Administered 2015-04-23 – 2015-04-24 (×2): 474 mL

## 2015-04-23 MED ORDER — CLONAZEPAM 1 MG PO TABS
1.0000 mg | ORAL_TABLET | Freq: Three times a day (TID) | ORAL | Status: DC | PRN
  Administered 2015-04-23 – 2015-04-25 (×3): 1 mg
  Filled 2015-04-23 (×3): qty 1

## 2015-04-23 MED ORDER — OXYCODONE HCL 5 MG PO TABS
20.0000 mg | ORAL_TABLET | ORAL | Status: DC | PRN
  Administered 2015-04-23 – 2015-04-25 (×6): 20 mg
  Filled 2015-04-23 (×6): qty 4

## 2015-04-23 NOTE — ED Provider Notes (Signed)
[facility] Emergency Department Provider Note  ____________________________________________  Time seen: Approximately 12:32 PM  I have reviewed the triage vital signs and the nursing notes.   HISTORY  Chief Complaint Weakness   HPI Mark [name] is a 59 y.o. male patient with past history of aspiration pneumonia. Began having cough and low-grade fever shortness of breath last day or 2. O2 sats are 87 on arrival in the emergency room patient is not usually on oxygen. Does have throat cancer as a feeding tube for the same. Patient has had aspiration pneumonia in the past. Chest x-ray now looks like aspiration pneumonia again. Patient is tachycardic blood pressure is lower than usual. Looks like sepsis we'll treat him for hospital-acquired pneumonia since he has been in so often.    Past Medical History  Diagnosis Date  . Cancer     tonsilal ca  . Aspiration pneumonia   . Tonsillar cancer, S/P surgery, chemotherapy XRT 2009-followed at [facility] 11/18/2014  . Peripheral neuropathy 11/18/2014  . Memory deficit 11/18/2014  . PEG tube  11/18/2014  . Incontinence     night time, began recently    Patient Active Problem List   Diagnosis Date Noted  . Aspiration pneumonia 11/18/2014  . Tonsillar cancer, S/P surgery, chemotherapy XRT 2009-followed at [facility] 11/18/2014  . Peripheral neuropathy 11/18/2014  . Adult failure to thrive 11/18/2014  . Recurrent aspiration pneumonia 11/18/2014  . Memory deficit 11/18/2014  . PEG tube  11/18/2014    Past Surgical History  Procedure Laterality Date  . Appendectomy    . Cholecystectomy    . Tonsillectomy      jan 2010--stage IV    Current Outpatient Rx  Name  Route  Sig  Dispense  Refill  . albuterol (PROVENTIL HFA;VENTOLIN HFA) 108 (90 BASE) MCG/ACT inhaler   Inhalation   Inhale 2 puffs into the lungs every 6 (six) hours as needed for wheezing or shortness of breath. ProAir         . ARIPiprazole (ABILIFY) 2 MG  tablet   PEG Tube   2 mg by PEG Tube route daily.          Marland Kitchen aspirin EC 81 MG tablet   PEG Tube   81 mg by PEG Tube route daily.         . budesonide-formoterol (SYMBICORT) 160-4.5 MCG/ACT inhaler   Inhalation   Inhale 2 puffs into the lungs 2 (two) times daily.         Marland Kitchen buPROPion (WELLBUTRIN SR) 150 MG 12 hr tablet   PEG Tube   150 mg by PEG Tube route 2 (two) times daily.         . clonazePAM (KLONOPIN) 1 MG tablet   PEG Tube   1 mg by PEG Tube route 3 (three) times daily as needed for anxiety.          . Ipratropium-Albuterol (COMBIVENT) 20-100 MCG/ACT AERS respimat   Inhalation   Inhale 2 puffs into the lungs 2 (two) times daily.         . mometasone (NASONEX) 50 MCG/ACT nasal spray   Each Nare   Place 2 sprays into both nostrils 2 (two) times daily.         . Nutritional Supplements (GLUCERNA 1.5 CAL PO)   PEG Tube   237-474 mLs by PEG Tube route See admin instructions. Take 2 cans (474 mls) as a meal substitute breakfast, lunch and dinner; take 1 can (237 mls) as a  snack mid afternoon         . oxycodone (ROXICODONE) 30 MG immediate release tablet   PEG Tube   30 mg by PEG Tube route 4 (four) times daily.         . pantoprazole (PROTONIX) 40 MG tablet   PEG Tube   40 mg by PEG Tube route 2 (two) times daily.         . pregabalin (LYRICA) 100 MG capsule   Oral   Take 100 mg by mouth 2 (two) times daily.         . simvastatin (ZOCOR) 20 MG tablet   PEG Tube   20 mg by PEG Tube route at bedtime.          . traZODone (DESYREL) 150 MG tablet   PEG Tube   150 mg by PEG Tube route at bedtime.         Marland Kitchen venlafaxine (EFFEXOR) 37.5 MG tablet   PEG Tube   37.5 mg by PEG Tube route 2 (two) times daily.           Allergies Morphine and related  Family History  Problem Relation Age of Onset  . Liver cancer Mother   . Stroke Father   . Testicular cancer Father     Social History Social History  Substance Use Topics  . Smoking  status: Current Every Day Smoker  . Smokeless tobacco: Never Used  . Alcohol Use: No    Review of Systems Constitutional: See H&P Eyes: No visual changes. ENT: No sore throat. Cardiovascular: Denies chest pain. Respiratory: See H&P Gastrointestinal: No abdominal pain.  No nausea, no vomiting.  No diarrhea.  No constipation. Genitourinary: Negative for dysuria. Musculoskeletal: Negative for back pain. Skin: Negative for rash. Neurological: Negative for headaches, focal weakness or numbness.  10-point ROS otherwise negative.  ____________________________________________   PHYSICAL EXAM:  VITAL SIGNS: ED Triage Vitals  Enc Vitals Group     BP 04/23/15 1102 105/59 mmHg     Pulse Rate 04/23/15 1102 123     Resp 04/23/15 1102 18     Temp 04/23/15 1102 98.3 F (36.8 C)     Temp Source 04/23/15 1102 Oral     SpO2 04/23/15 1102 87 %     Weight 04/23/15 1102 155 lb (70.308 kg)     Height 04/23/15 1102 6' (1.829 m)     Head Cir --      Peak Flow --      Pain Score 04/23/15 1139 10     Pain Loc --      Pain Edu? --      Excl. in [place]? --     Constitutional: Alert and oriented. Well appearing and in no acute distress. Eyes: Conjunctivae are normal. PERRL. EOMI. Head: Atraumatic. Nose: No congestion/rhinnorhea. Mouth/Throat: Mucous membranes are moist.  Oropharynx non-erythematous. Neck: No stridor.   Cardiovascular: Normal rate, regular rhythm. Grossly normal heart sounds.  Good peripheral circulation. Respiratory: Normal respiratory effort.  No retractions. Lungs CTAB. Gastrointestinal: Soft and nontender. No distention. No abdominal bruits. No CVA tenderness. Musculoskeletal: No lower extremity tenderness nor edema.  No joint effusions. Neurologic:  Normal speech and language. No gross focal neurologic deficits are appreciated. No gait instability. Skin:  Skin is warm, dry and intact. No rash noted. Psychiatric: Mood and affect are normal. Speech and behavior are  normal.  ____________________________________________   LABS (all labs ordered are listed, but only abnormal results are displayed)  Labs Reviewed  COMPREHENSIVE METABOLIC PANEL - Abnormal; Notable for the following:    Chloride 97 (*)    Glucose, Bld 190 (*)    ALT 16 (*)    All other components within normal limits  CBC WITH DIFFERENTIAL/PLATELET - Abnormal; Notable for the following:    WBC 17.3 (*)    RBC 4.32 (*)    Hemoglobin 12.5 (*)    HCT 38.5 (*)    RDW 14.6 (*)    Platelets 149 (*)    Neutro Abs 15.9 (*)    Lymphs Abs 0.4 (*)    All other components within normal limits  CULTURE, BLOOD (ROUTINE X 2)  CULTURE, BLOOD (ROUTINE X 2)  URINE CULTURE  LACTIC ACID, PLASMA  LACTIC ACID, PLASMA  URINALYSIS COMPLETEWITH MICROSCOPIC ([facility] ONLY)   ____________________________________________  EKG  EKG read and interpreted by me shows sinus tachycardia rate of 109 normal axis no acute changes. Heart monitors running up to 130s pressure patient sits up blood pressures 557 systolic when the patient is sitting down from 113 when he was laying down ____________________________________________  RADIOLOGY  Chest x-ray read by radiology as probable aspiration pneumonia patient has a history of the same in his bilateral ____________________________________________   PROCEDURES    ____________________________________________   INITIAL IMPRESSION / ASSESSMENT AND PLAN / ED COURSE  Pertinent labs & imaging results that were available during my care of the patient were reviewed by me and considered in my medical decision making (see chart for details).  _____________________________________   FINAL CLINICAL IMPRESSION(S) / ED DIAGNOSES  Final diagnoses:  Aspiration pneumonia, unspecified aspiration pneumonia type  Sepsis, due to unspecified organism      Nena Polio, MD 04/23/15 1237

## 2015-04-23 NOTE — Progress Notes (Signed)
MD notified, BP remains low with some improvement. Order for NS 500 ml/hr.

## 2015-04-23 NOTE — ED Notes (Signed)
bc x 2 drawn and sent one from left ac one from right radial

## 2015-04-23 NOTE — H&P (Signed)
[facility] NAME: Mark [name]    MR#:  [id number]  DATE OF BIRTH:  1956-06-06  DATE OF ADMISSION:  04/23/2015  PRIMARY CARE PHYSICIAN: Isaias Cowman, PA-C   REQUESTING/REFERRING PHYSICIAN: Conni Slipper  CHIEF COMPLAINT:   Chief Complaint  Patient presents with  . Weakness    HISTORY OF PRESENT ILLNESS:  Mark [name]  is a 59 y.o. male with a known history of stage IV tonsillar cancer. He has been experimenting with eating mashed potatoes and drinking liquids. Most of his nutrition comes via the PEG tube. He has not been feeling well since Friday. He has been having hurting in his chest and back. Feeling like a stabbing pain 10 out of 10 intensity and the oxycodone helps. Pain is constant in nature. He is also been feeling weak and coughing up green phlegm. He had a fever of 102 his heart rate was up in his blood pressure was down. He also had nausea vomiting 3 today. He knew he had an aspiration pneumonia and came into the hospital. In the ER on chest x-ray found to have a pneumonia.  PAST MEDICAL HISTORY:   Past Medical History  Diagnosis Date  . Cancer     tonsilal ca  . Aspiration pneumonia   . Tonsillar cancer, S/P surgery, chemotherapy XRT 2009-followed at [facility] 11/18/2014  . Peripheral neuropathy 11/18/2014  . Memory deficit 11/18/2014  . PEG tube  11/18/2014  . Incontinence     night time, began recently    PAST SURGICAL HISTORY:   Past Surgical History  Procedure Laterality Date  . Appendectomy    . Cholecystectomy    . Tonsillectomy      jan 2010--stage IV    SOCIAL HISTORY:   Social History  Substance Use Topics  . Smoking status: Current Every Day Smoker  . Smokeless tobacco: Never Used  . Alcohol Use: No    FAMILY HISTORY:   Family History  Problem Relation Age of Onset  . Liver cancer Mother   . Stroke Father   . Testicular cancer Father     DRUG ALLERGIES:   Allergies  Allergen  Reactions  . Morphine And Related Nausea And Vomiting    REVIEW OF SYSTEMS:  CONSTITUTIONAL: Positive for fever and chills and sweats and fatigue.  EYES: Positive for blurred vision. Wears glasses. EARS, NOSE, AND THROAT: No tinnitus or ear pain. No sore throat. Positive for runny nose. Positive for swallowing issues. RESPIRATORY: Positive for cough and shortness of breath, no wheezing or hemoptysis.  CARDIOVASCULAR: Positive chest pain, no orthopnea, edema.  GASTROINTESTINAL: Positive for nausea and vomiting, no diarrhea or abdominal pain. No blood in bowel movements. History of constipation. GENITOURINARY: No dysuria, hematuria.  ENDOCRINE: No polyuria, nocturia,  HEMATOLOGY: No anemia, easy bruising or bleeding SKIN: No rash or lesion. MUSCULOSKELETAL: No joint pain or arthritis.  Some muscle pain NEUROLOGIC: Peripheral neuropathy PSYCHIATRY: Positive for anxiety or depression.   MEDICATIONS AT HOME:   Prior to Admission medications   Medication Sig Start Date End Date Taking? Authorizing Provider  albuterol (PROVENTIL HFA;VENTOLIN HFA) 108 (90 BASE) MCG/ACT inhaler Inhale 2 puffs into the lungs every 6 (six) hours as needed for wheezing or shortness of breath.    Yes Historical Provider, MD  ARIPiprazole (ABILIFY) 2 MG tablet 2 mg by PEG Tube route daily.    Yes Historical Provider, MD  aspirin EC 81 MG tablet 81 mg by PEG Tube route  daily.   Yes Historical Provider, MD  budesonide-formoterol (SYMBICORT) 160-4.5 MCG/ACT inhaler Inhale 2 puffs into the lungs 2 (two) times daily.   Yes Historical Provider, MD  buPROPion (WELLBUTRIN SR) 150 MG 12 hr tablet 150 mg by PEG Tube route 2 (two) times daily.   Yes Historical Provider, MD  clonazePAM (KLONOPIN) 1 MG tablet 1 mg by PEG Tube route 3 (three) times daily as needed for anxiety.    Yes Historical Provider, MD  Ipratropium-Albuterol (COMBIVENT) 20-100 MCG/ACT AERS respimat Inhale 2 puffs into the lungs 2 (two) times daily.   Yes  Historical Provider, MD  mometasone (NASONEX) 50 MCG/ACT nasal spray Place 2 sprays into both nostrils 2 (two) times daily.   Yes Historical Provider, MD  Nutritional Supplements (GLUCERNA 1.5 CAL PO) 237 mLs by PEG Tube route See admin instructions. Pt uses up to seven times per day.   Yes Historical Provider, MD  Oxycodone HCl 20 MG TABS 20 mg by PEG Tube route every 4 (four) hours as needed (for pain).   Yes Historical Provider, MD  pantoprazole (PROTONIX) 40 MG tablet 40 mg by PEG Tube route at bedtime.    Yes Historical Provider, MD  pregabalin (LYRICA) 300 MG capsule Take 300 mg by mouth 2 (two) times daily.   Yes Historical Provider, MD  traZODone (DESYREL) 150 MG tablet 150 mg by PEG Tube route at bedtime.   Yes Historical Provider, MD  venlafaxine (EFFEXOR) 37.5 MG tablet 37.5 mg by PEG Tube route 2 (two) times daily.   Yes Historical Provider, MD      VITAL SIGNS:  Blood pressure 112/72, pulse 110, temperature 99.8 F (37.7 C), temperature source Oral, resp. rate 20, height 6' (1.829 m), weight 70.308 kg (155 lb), SpO2 98 %.  PHYSICAL EXAMINATION:  GENERAL:  59 y.o.-year-old patient lying in the bed with no acute distress.  EYES: Pupils equal, round, reactive to light and accommodation. No scleral icterus. Extraocular muscles intact.  HEENT: Head atraumatic, normocephalic. Oropharynx and nasopharynx clear.  NECK:  Supple, no jugular venous distention. No thyroid enlargement, no tenderness.  LUNGS: Decreased breath sounds bilateral bases, no wheezing, rales,rhonchi or crepitation. No use of accessory muscles of respiration.  CARDIOVASCULAR: S1, S2 normal tachycardic. No murmurs, rubs, or gallops.  ABDOMEN: Soft, nontender, nondistended. Bowel sounds present. No organomegaly or mass.  EXTREMITIES: No pedal edema, cyanosis, or clubbing.  NEUROLOGIC: Cranial nerves II through XII are intact. Muscle strength 5/5 in all extremities. Sensation intact. Gait not checked.  PSYCHIATRIC: The  patient is alert and oriented x 3.  SKIN: No rash, lesion, or ulcer.   LABORATORY PANEL:   CBC  Recent Labs Lab 04/23/15 1137  WBC 17.3*  HGB 12.5*  HCT 38.5*  PLT 149*   ------------------------------------------------------------------------------------------------------------------  Chemistries   Recent Labs Lab 04/23/15 1137  NA 137  K 4.5  CL 97*  CO2 31  GLUCOSE 190*  BUN 20  CREATININE 0.82  CALCIUM 9.0  AST 24  ALT 16*  ALKPHOS 100  BILITOT 0.6   ------------------------------------------------------------------------------------------------------------------  Cardiac Enzymes No results for input(s): TROPONINI in the last 168 hours. ------------------------------------------------------------------------------------------------------------------  RADIOLOGY:  Dg Chest Port 1 View  04/23/2015   CLINICAL DATA:  Fever. Tachycardia and confusion. History of aspiration pneumonia.  EXAM: PORTABLE CHEST - 1 VIEW  COMPARISON:  11/20/2014  FINDINGS: Abnormal airspace opacity at the right lung base and right midlung, and at the left lung base. Faintly accentuated interstitium.  Cardiac and mediastinal margins appear normal.  No pleural effusion identified.  IMPRESSION: 1. Bibasilar and right mid lung airspace opacities concerning for multilobar pneumonia or aspiration pneumonitis.   Electronically Signed   By: Van Clines M.D.   On: 04/23/2015 12:20    EKG:   Sinus tachycardia, no acute ST-T wave changes.  IMPRESSION AND PLAN:   1. Clinical sepsis. Aspiration pneumonia right lung, tachycardia, leukocytosis and fever at home. Blood cultures 2, sputum culture. Empiric Zosyn. 2. History of stage IV tonsillar cancer with dysphagia. I will get a swallow evaluation and continue PEG feeds. 3. Gastroesophageal reflux disease without esophagitis continue PPI 4. Anxiety depression continue psychiatric medications 5. Neuropathy on Lyrica 6. COPD- not in exacerbation  at this point. Continue inhalers and oxygen at night 7. Tobacco abuse- smoking cessation counseling done 3 minutes by me nicotine patch applied  All the records are reviewed and case discussed with ED provider. Management plans discussed with the patient, family and they are in agreement.  CODE STATUS: Full code  TOTAL TIME TAKING CARE OF THIS PATIENT: 55 minutes.    Loletha Grayer M.D on 04/23/2015 at 1:10 PM  Between 7am to 6pm - Pager - [phone]  After 6pm call admission pager [facility]  Office  [phone]  CC: Primary care physician; Isaias Cowman, PA-C

## 2015-04-23 NOTE — Progress Notes (Signed)
MD notified pt has a low BP. MD to put in orders.

## 2015-04-23 NOTE — Progress Notes (Signed)
ANTIBIOTIC CONSULT NOTE - INITIAL  Pharmacy Consult for ZOSYN Indication:Aspiration PNA/sepsis  Allergies  Allergen Reactions  . Morphine And Related Nausea And Vomiting    Patient Measurements: Height: 6' (182.9 cm) Weight: 154 lb 5 oz (69.996 kg) IBW/kg (Calculated) : 77.6 Adjusted Body Weight:   Vital Signs: Temp: 99.5 F (37.5 C) (09/05 1457) Temp Source: Oral (09/05 1457) BP: 98/65 mmHg (09/05 1457) Pulse Rate: 97 (09/05 1457)  Labs:  Recent Labs  04/23/15 1137  WBC 17.3*  HGB 12.5*  PLT 149*  CREATININE 0.82   Estimated Creatinine Clearance: 96 mL/min (by C-G formula based on Cr of 0.82).   Microbiology: No results found for this or any previous visit (from the past 720 hour(s)).  Medical History: Past Medical History  Diagnosis Date  . Cancer     tonsilal ca  . Aspiration pneumonia   . Tonsillar cancer, S/P surgery, chemotherapy XRT 2009-followed at [facility] 11/18/2014  . Peripheral neuropathy 11/18/2014  . Memory deficit 11/18/2014  . PEG tube  11/18/2014  . Incontinence     night time, began recently    Medications:  Scheduled:  . ARIPiprazole  2 mg Per Tube Daily  . aspirin EC  81 mg Oral Daily  . budesonide-formoterol  2 puff Inhalation BID  . buPROPion  150 mg Oral BID  . enoxaparin (LOVENOX) injection  40 mg Subcutaneous Q24H  . feeding supplement (GLUCERNA 1.5 CAL)  237 mL Per Tube BID BM  . feeding supplement (GLUCERNA 1.5 CAL)  474 mL Per Tube TID WC  . fluticasone  2 spray Each Nare Daily  . ipratropium-albuterol  3 mL Inhalation BID  . nicotine  21 mg Transdermal Daily  . pantoprazole sodium  40 mg Per Tube QHS  . piperacillin-tazobactam (ZOSYN)  IV  3.375 g Intravenous 3 times per day  . pregabalin  300 mg Oral BID  . traZODone  150 mg Per Tube QHS  . venlafaxine  37.5 mg Per Tube BID   Anti-infectives    Start     Dose/Rate Route Frequency Ordered Stop   04/23/15 2000  piperacillin-tazobactam (ZOSYN) IVPB 3.375 g     3.375 g 12.5  mL/hr over 240 Minutes Intravenous 3 times per day 04/23/15 1618     04/23/15 1245  piperacillin-tazobactam (ZOSYN) IVPB 3.375 g     3.375 g 12.5 mL/hr over 240 Minutes Intravenous  Once 04/23/15 1231 04/23/15 1334   04/23/15 1245  ciprofloxacin (CIPRO) IVPB 400 mg  Status:  Discontinued     400 mg 200 mL/hr over 60 Minutes Intravenous  Once 04/23/15 1231 04/23/15 1242   04/23/15 1245  clindamycin (CLEOCIN) IVPB 600 mg  Status:  Discontinued     600 mg 100 mL/hr over 30 Minutes Intravenous  Once 04/23/15 1231 04/23/15 1242     Assessment: 59 yo male with Aspiration pneumonia of right lung, tachycardia, leukocytosis and fever at home. Empiric Zosyn. 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 Blood cultures 2, sputum culture.  Plan:  Follow up culture results  Patient received Zosyn 3.375gm IV x 1 in ER. Will continue with EI Zosyn 3.375gm IV q8h.  Chinita Greenland PharmD Clinical Pharmacist 04/23/2015 4:22 PM

## 2015-04-23 NOTE — ED Notes (Signed)
Reports weakness , fever.  Pt has feeding tube, from throat ca.

## 2015-04-24 LAB — BASIC METABOLIC PANEL
ANION GAP: 5 (ref 5–15)
BUN: 19 mg/dL (ref 6–20)
CHLORIDE: 106 mmol/L (ref 101–111)
CO2: 30 mmol/L (ref 22–32)
Calcium: 8.1 mg/dL — ABNORMAL LOW (ref 8.9–10.3)
Creatinine, Ser: 0.74 mg/dL (ref 0.61–1.24)
GFR calc non Af Amer: >60 mL/min (ref >60)
Glucose, Bld: 122 mg/dL — ABNORMAL HIGH (ref 65–99)
POTASSIUM: 3.8 mmol/L (ref 3.5–5.1)
SODIUM: 141 mmol/L (ref 135–145)

## 2015-04-24 LAB — CBC
HEMATOCRIT: 32.7 % — AB (ref 40.0–52.0)
HEMOGLOBIN: 10.6 g/dL — AB (ref 13.0–18.0)
MCH: 29 pg (ref 26.0–34.0)
MCHC: 32.4 g/dL (ref 32.0–36.0)
MCV: 89.6 fL (ref 80.0–100.0)
PLATELETS: 132 10*3/uL — AB (ref 150–440)
RBC: 3.65 MIL/uL — ABNORMAL LOW (ref 4.40–5.90)
RDW: 14.8 % — ABNORMAL HIGH (ref 11.5–14.5)
WBC: 12.2 10*3/uL — AB (ref 3.8–10.6)

## 2015-04-24 LAB — EXPECTORATED SPUTUM ASSESSMENT W GRAM STAIN, RFLX TO RESP C

## 2015-04-24 MED ORDER — GLUCERNA 1.5 CAL PO LIQD
474.0000 mL | Freq: Three times a day (TID) | ORAL | Status: DC
  Administered 2015-04-24 – 2015-04-25 (×3): 474 mL

## 2015-04-24 MED ORDER — GLUCERNA 1.5 CAL PO LIQD
237.0000 mL | Freq: Every day | ORAL | Status: DC
  Administered 2015-04-24: 237 mL

## 2015-04-24 MED ORDER — FREE WATER
200.0000 mL | Freq: Four times a day (QID) | Status: DC
  Administered 2015-04-24: 11:00:00 200 mL

## 2015-04-24 MED ORDER — FREE WATER
200.0000 mL | Freq: Three times a day (TID) | Status: DC
  Administered 2015-04-24 – 2015-04-25 (×2): 200 mL

## 2015-04-24 MED ORDER — GLUCERNA 1.5 CAL PO LIQD: 237.0000 mL | Freq: Every day | ORAL | Status: DC

## 2015-04-24 MED ORDER — GLUCERNA 1.5 CAL PO LIQD
474.0000 mL | Freq: Three times a day (TID) | ORAL | Status: DC
Start: 2015-04-24 — End: 2015-04-24

## 2015-04-24 NOTE — Progress Notes (Signed)
MD notified BP is improving 87/40, pt wants pain medication. Give tylenol for now and continue to monitor.

## 2015-04-24 NOTE — Progress Notes (Signed)
Speech Therapy Note: received order, reviewed chart notes and consulted pt and wife re: his swallowing. Pt is admitted for aspiration pneumonia. Pt does have long-standing baseline dysphagia w/ increased risk for aspiration d/t tonsillar/throat Ca w/ surgery and XRT - this has been identified in multiple MBSSs in the past 5 years. Recommendation has been for NPO status w/ pt taking pleasure bites of puree; primary nutrition/hydration is from PEG TFs/flushes. Pt expressed his thoughts on wanting to eat/drink and stated he "just can't stop altogether". Wife present and heard this. Reviewed oral care and precautions; risks for aspiration w/ pt and wife.  ST services rec. Continue NPO w/ TFs via PEG for nutrition/hydration. Pt/wife agreed. Discussed w/ MD who agreed. No further skilled ST services indicated at this time.

## 2015-04-24 NOTE — Progress Notes (Signed)
Notified Dr. Jannifer Franklin of patient telemetry monitor. Okay to d/c telemetry per MD order.

## 2015-04-24 NOTE — Progress Notes (Signed)
[place] at [place] NAME: Mark [name]    MR#:  [id number]  DATE OF BIRTH:  May 01, 1956  SUBJECTIVE:  Feels a lot better. Wants to  Go home REVIEW OF SYSTEMS:   Review of Systems  Constitutional: Negative for fever, chills and weight loss.  HENT: Negative for ear discharge, ear pain and nosebleeds.   Eyes: Negative for blurred vision, pain and discharge.  Respiratory: Negative for sputum production, shortness of breath, wheezing and stridor.   Cardiovascular: Negative for chest pain, palpitations, orthopnea and PND.  Gastrointestinal: Negative for nausea, vomiting, abdominal pain and diarrhea.  Genitourinary: Negative for urgency and frequency.  Musculoskeletal: Negative for back pain and joint pain.  Neurological: Negative for sensory change, speech change, focal weakness and weakness.  Psychiatric/Behavioral: Negative for depression. The patient is not nervous/anxious.   All other systems reviewed and are negative.  Tolerating Diet:npo Tolerating PT: not needed  DRUG ALLERGIES:   Allergies  Allergen Reactions  . Morphine And Related Nausea And Vomiting    VITALS:  Blood pressure 89/54, pulse 80, temperature 98 F (36.7 C), temperature source Oral, resp. rate 18, height 6' (1.829 m), weight 69.996 kg (154 lb 5 oz), SpO2 85 %.  PHYSICAL EXAMINATION:   Physical Exam  GENERAL:  59 y.o.-year-old patient lying in the bed with no acute distress.  EYES: Pupils equal, round, reactive to light and accommodation. No scleral icterus. Extraocular muscles intact.  HEENT: Head atraumatic, normocephalic. Oropharynx and nasopharynx clear.  NECK:  Supple, no jugular venous distention. No thyroid enlargement, no tenderness.  LUNGS: coarse breath sounds bilaterally, no wheezing, rales, rhonchi. No use of accessory muscles of respiration.  CARDIOVASCULAR: S1, S2 normal. No murmurs, rubs, or gallops.  ABDOMEN: Soft, nontender, nondistended.  Bowel sounds present. No organomegaly or mass. PEG+ EXTREMITIES: No cyanosis, clubbing or edema b/l.    NEUROLOGIC: Cranial nerves II through XII are intact. No focal Motor or sensory deficits b/l.   PSYCHIATRIC: The patient is alert and oriented x 3.  SKIN: No obvious rash, lesion, or ulcer.    LABORATORY PANEL:   CBC  Recent Labs Lab 04/24/15 0553  WBC 12.2*  HGB 10.6*  HCT 32.7*  PLT 132*    Chemistries   Recent Labs Lab 04/23/15 1137 04/24/15 0553  NA 137 141  K 4.5 3.8  CL 97* 106  CO2 31 30  GLUCOSE 190* 122*  BUN 20 19  CREATININE 0.82 0.74  CALCIUM 9.0 8.1*  AST 24  --   ALT 16*  --   ALKPHOS 100  --   BILITOT 0.6  --     Cardiac Enzymes No results for input(s): TROPONINI in the last 168 hours.  RADIOLOGY:  Dg Chest Port 1 View  04/23/2015   CLINICAL DATA:  Fever. Tachycardia and confusion. History of aspiration pneumonia.  EXAM: PORTABLE CHEST - 1 VIEW  COMPARISON:  11/20/2014  FINDINGS: Abnormal airspace opacity at the right lung base and right midlung, and at the left lung base. Faintly accentuated interstitium.  Cardiac and mediastinal margins appear normal. No pleural effusion identified.  IMPRESSION: 1. Bibasilar and right mid lung airspace opacities concerning for multilobar pneumonia or aspiration pneumonitis.   Electronically Signed   By: Van Clines M.D.   On: 04/23/2015 12:20   ASSESSMENT AND PLAN:   1. Clinical sepsis. Aspiration pneumonia right lung, tachycardia, leukocytosis and fever at home. Blood cultures 2 negative -cont IV Empiric Zosyn. 2. History of  stage IV tonsillar cancer with dysphagia. will get a swallow evaluation and continue PEG feeds. -pt advised not to take po food at home 3. Gastroesophageal reflux disease without esophagitis continue PPI 4. Anxiety depression continue psychiatric medications 5. Neuropathy on Lyrica 6. COPD- not in exacerbation at this point. Continue inhalers and oxygen at night 7. Tobacco abuse-  smoking cessation counseling done 3 minutes by me nicotine patch applied  If remains stable will d/c in am   Case discussed with Care Management/Social Worker. Management plans discussed with the patient, family and they are in agreement.  CODE STATUS: full  DVT Prophylaxis: lovenox  TOTAL TIME TAKING CARE OF THIS PATIENT: 30 minutes.  >50% time spent on counselling and coordination of care with pt,wife  POSSIBLE D/C IN 1 DAYS, DEPENDING ON CLINICAL CONDITION.   Kamarii Carton M.D on 04/24/2015 at 4:41 PM  Between 7am to 6pm - Pager - [phone]  After 6pm go to [email or web address] - password [email or web address] [facility]  Office  [phone]  CC: Primary care physician; Isaias Cowman, PA-C

## 2015-04-24 NOTE — Plan of Care (Addendum)
Problem: Discharge Progression Outcomes Goal: Other Discharge Outcomes/Goals Outcome: Progressing Pt is alert and oriented, independent. Encouraged to call if needs assistance. Pt has low BP, 500 ml NS bolus given with little improvement. 1 L NS given at 500 ml/hr given with more improvement.  Pt states he is in pain, unable to give oxycodone, patient refuses tylenol. Continue to assess.

## 2015-04-24 NOTE — Care Management (Signed)
Admitted to [facility] with diagnosis of sepsis/aspiration pneumonia. Lives with wife, Anne Ng, [phone]). PEG since March 2014. Followed by [facility] for PEG supplies since March 2014. No skilled facility. Home oxygen at night at 2liters per nasal cannula. Chronic oxygen thru [facility]. Takes care of basic and instrumental activities of daily living, still dives. Last seen Isaias Cowman PA-C August 29th at [facility] in [place]. Wife will transport when discharged. Shelbie Ammons RN MSN Care Management [phone]

## 2015-04-24 NOTE — Progress Notes (Signed)
Initial Nutrition Assessment   INTERVENTION:   EN: Spoke with MD Posey Pronto regarding nutrition poc. Recommend restarting Glucerna 1.5 on pt's home regimen of 7 cans per day; 2 cans at three of feedings and 1 can in the afternoon. 7 total cans of Glucerna 1.5 provides 2488kcals and 137g protein with 12109mL of free water. Will recommend additional free water flushes of 219mL q 8 hours for a total of 2288mL of free water in 24 hours from TF and flushes. Will continue to follow and make recommendations accordingly.   NUTRITION DIAGNOSIS:   Swallowing difficulty related to cancer and cancer related treatments as evidenced by  (PEG required for enteral nutrition.)  GOAL:   Patient will meet greater than or equal to 90% of their needs  MONITOR:    (Energy Intake, Digestive system, Anthropometrics)  REASON FOR ASSESSMENT:   Consult Enteral/tube feeding initiation and management  ASSESSMENT:   Pt admitted with sepsis secondary to aspiration pna. Pt with h/o stage IV tonsillar cancer receiving nutrition via PEG. Pt reports trying to eat mashed potatoes and liquids PTA.  Past Medical History  Diagnosis Date  . Cancer     tonsilal ca  . Aspiration pneumonia   . Tonsillar cancer, S/P surgery, chemotherapy XRT 2009-followed at [facility] 11/18/2014  . Peripheral neuropathy 11/18/2014  . Memory deficit 11/18/2014  . PEG tube  11/18/2014  . Incontinence     night time, began recently    Diet Order:  Diet NPO time specified    Current Nutrition: Pt reports giving himself 2 cans of Glucerna 1.5 this am without difficulty.   Food/Nutrition-Related History: Pt reports taking 7 cans of Glucerna 1.5 per day. Pt reports a schedule of 2 cans at 6am, 2 cans at noon, 1 can at 3pm and 2 cans at 6pm PTA. Pt reports being on a different formula but as it was making his blood sugars elevated was switched to Glucerna.     Medications: Protonix, IVF discontinued this am  Electrolyte/Renal Profile and Glucose  Profile:   Recent Labs Lab 04/23/15 1137 04/24/15 0553  NA 137 141  K 4.5 3.8  CL 97* 106  CO2 31 30  BUN 20 19  CREATININE 0.82 0.74  CALCIUM 9.0 8.1*  GLUCOSE 190* 122*   Protein Profile:  Recent Labs Lab 04/23/15 1137  ALBUMIN 4.1    Gastrointestinal Profile: Last BM:  04/21/2015    Nutrition-Focused Physical Exam Findings: Nutrition-Focused physical exam completed. Findings are WDL for fat depletion, muscle depletion, and edema.     Weight Change: Pt reports weight gain recently.   Skin:  Reviewed, no issues  Height:   Ht Readings from Last 1 Encounters:  04/23/15 6' (1.829 m)    Weight:   Wt Readings from Last 1 Encounters:  04/23/15 154 lb 5 oz (69.996 kg)   Wt Readings from Last 10 Encounters:  04/23/15 154 lb 5 oz (69.996 kg)  03/28/15 156 lb (70.761 kg)  03/01/15 152 lb 12.5 oz (69.3 kg)  11/18/14 147 lb 7.8 oz (66.9 kg)  09/18/11 175 lb (79.379 kg)    BMI:  Body mass index is 20.92 kg/(m^2).  Estimated Nutritional Needs:   Kcal:  2231-2616kcals, BEE: 1549kcals, TEE: (IF 1.1-1.3)(AF 1.3)   Protein:  84-105g protein (1.2-1.5g/kg)   Fluid:  2100-2427mL of fluid (30-54mL/kg)   EDUCATION NEEDS:   No education needs identified at this time    [place], RD, LDN Pager [phone]

## 2015-04-24 NOTE — H&P (Signed)
Dr. Jannifer Franklin notified of patients low BP, patient asymptomatic, MD aware, will continue to monitor, Vital signs q4hr per MD.

## 2015-04-25 LAB — EXPECTORATED SPUTUM ASSESSMENT W GRAM STAIN, RFLX TO RESP C

## 2015-04-25 LAB — URINE CULTURE

## 2015-04-25 LAB — EXPECTORATED SPUTUM ASSESSMENT W REFEX TO RESP CULTURE

## 2015-04-25 MED ORDER — LEVOFLOXACIN 25 MG/ML PO SOLN
500.0000 mg | Freq: Every day | ORAL | Status: DC
  Filled 2015-04-25: qty 20

## 2015-04-25 MED ORDER — LEVOFLOXACIN 25 MG/ML PO SOLN: 500.0000 mg | Freq: Every day | ORAL | Status: DC

## 2015-04-25 MED ORDER — LEVOFLOXACIN 500 MG PO TABS: 500.0000 mg | ORAL_TABLET | Freq: Every day | ORAL | Status: DC

## 2015-04-25 MED ORDER — FREE WATER: 200.0000 mL | Freq: Three times a day (TID) | Status: DC

## 2015-04-25 MED ORDER — GLUCERNA 1.5 CAL PO LIQD: 237.0000 mL | Freq: Every day | ORAL | Status: DC

## 2015-04-25 NOTE — Plan of Care (Signed)
Problem: Discharge Progression Outcomes Goal: Other Discharge Outcomes/Goals Outcome: Progressing Plan of care progress to goal - Continues ABX. - Breathing tx continued. - Ambulates in room. - Low BP this shift, MD notified, patient asymptomatic, vital signs Q4hr per MD, continue to monitor.

## 2015-04-25 NOTE — Discharge Summary (Signed)
[place] at [place] NAME: Mark [name]    MR#:  [id number]  DATE OF BIRTH:  1955/09/02  DATE OF ADMISSION:  04/23/2015 ADMITTING PHYSICIAN: Loletha Grayer, MD  DATE OF DISCHARGE: 04/25/15  PRIMARY CARE PHYSICIAN: Isaias Cowman, PA-C    ADMISSION DIAGNOSIS:  Aspiration pneumonia, unspecified aspiration pneumonia type [J69.0] Sepsis, due to unspecified organism [A41.9]  DISCHARGE DIAGNOSIS:  Sepsis-resolved Aspiration Pneumonia  SECONDARY DIAGNOSIS:   Past Medical History  Diagnosis Date  . Cancer     tonsilal ca  . Aspiration pneumonia   . Tonsillar cancer, S/P surgery, chemotherapy XRT 2009-followed at [facility] 11/18/2014  . Peripheral neuropathy 11/18/2014  . Memory deficit 11/18/2014  . PEG tube  11/18/2014  . Incontinence     night time, began recently    HOSPITAL COURSE:   1.Clinical sepsis. Aspiration pneumonia right lung, tachycardia, leukocytosis and fever at home. Blood cultures 2 negative -cont IV Empiric Zosyn--->Levaquin for additional 7 days 2. History of stage IV tonsillar cancer with dysphagia. will get a swallow evaluation and continue PEG feeds. -pt advised not to take po food at home 3. Gastroesophageal reflux disease without esophagitis continue PPI 4. Anxiety depression continue psychiatric medications 5. Neuropathy on Lyrica 6. COPD- not in exacerbation at this point. Continue inhalers and oxygen at night 7. Tobacco abuse- smoking cessation counseling done 3 minutes by me nicotine patch applied  Overall much improved. D/c home DISCHARGE CONDITIONS:  fair  CONSULTS OBTAINED:    none DRUG ALLERGIES:   Allergies  Allergen Reactions  . Morphine And Related Nausea And Vomiting    DISCHARGE MEDICATIONS:   Current Discharge Medication List    START taking these medications   Details  levofloxacin (LEVAQUIN) 25 MG/ML solution Take 20 mLs (500 mg total) by mouth daily. Qty: 140 mL, Refills: 0     !! Nutritional Supplements (FEEDING SUPPLEMENT, GLUCERNA 1.5 CAL,) LIQD Place 237 mLs into feeding tube daily at 3 pm. Qty: 30 Can, Refills: 0    Water For Irrigation, Sterile (FREE WATER) SOLN Place 200 mLs into feeding tube every 8 (eight) hours. Qty: 1000 mL, Refills: 0     !! - Potential duplicate medications found. Please discuss with provider.    CONTINUE these medications which have NOT CHANGED   Details  albuterol (PROVENTIL HFA;VENTOLIN HFA) 108 (90 BASE) MCG/ACT inhaler Inhale 2 puffs into the lungs every 6 (six) hours as needed for wheezing or shortness of breath.     ARIPiprazole (ABILIFY) 2 MG tablet 2 mg by PEG Tube route daily.     aspirin EC 81 MG tablet 81 mg by PEG Tube route daily.    budesonide-formoterol (SYMBICORT) 160-4.5 MCG/ACT inhaler Inhale 2 puffs into the lungs 2 (two) times daily.    buPROPion (WELLBUTRIN SR) 150 MG 12 hr tablet 150 mg by PEG Tube route 2 (two) times daily.    clonazePAM (KLONOPIN) 1 MG tablet 1 mg by PEG Tube route 3 (three) times daily as needed for anxiety.     Ipratropium-Albuterol (COMBIVENT) 20-100 MCG/ACT AERS respimat Inhale 2 puffs into the lungs 2 (two) times daily.    mometasone (NASONEX) 50 MCG/ACT nasal spray Place 2 sprays into both nostrils 2 (two) times daily.    !! Nutritional Supplements (GLUCERNA 1.5 CAL PO) 237 mLs by PEG Tube route See admin instructions. Pt uses up to seven times per day.    Oxycodone HCl 20 MG TABS 20 mg by PEG Tube route every  4 (four) hours as needed (for pain).    pantoprazole (PROTONIX) 40 MG tablet 40 mg by PEG Tube route at bedtime.     pregabalin (LYRICA) 300 MG capsule Take 300 mg by mouth 2 (two) times daily.    traZODone (DESYREL) 150 MG tablet 150 mg by PEG Tube route at bedtime.    venlafaxine (EFFEXOR) 37.5 MG tablet 37.5 mg by PEG Tube route 2 (two) times daily.     !! - Potential duplicate medications found. Please discuss with provider.      If you experience worsening of  your admission symptoms, develop shortness of breath, life threatening emergency, suicidal or homicidal thoughts you must seek medical attention immediately by calling 911 or calling your MD immediately  if symptoms less severe.  You Must read complete instructions/literature along with all the possible adverse reactions/side effects for all the Medicines you take and that have been prescribed to you. Take any new Medicines after you have completely understood and accept all the possible adverse reactions/side effects.   Please note  You were cared for by a hospitalist during your hospital stay. If you have any questions about your discharge medications or the care you received while you were in the hospital after you are discharged, you can call the unit and asked to speak with the hospitalist on call if the hospitalist that took care of you is not available. Once you are discharged, your primary care physician will handle any further medical issues. Please note that NO REFILLS for any discharge medications will be authorized once you are discharged, as it is imperative that you return to your primary care physician (or establish a relationship with a primary care physician if you do not have one) for your aftercare needs so that they can reassess your need for medications and monitor your lab values. Today   SUBJECTIVE  Doing well. Ready to go home   VITAL SIGNS:  Blood pressure 94/57, pulse 87, temperature 97.5 F (36.4 C), temperature source Oral, resp. rate 20, height 6' (1.829 m), weight 69.996 kg (154 lb 5 oz), SpO2 97 %.  I/O:  No intake or output data in the 24 hours ending 04/25/15 0917  PHYSICAL EXAMINATION:  GENERAL:  59 y.o.-year-old patient lying in the bed with no acute distress.  EYES: Pupils equal, round, reactive to light and accommodation. No scleral icterus. Extraocular muscles intact.  HEENT: Head atraumatic, normocephalic. Oropharynx and nasopharynx clear.  NECK:   Supple, no jugular venous distention. No thyroid enlargement, no tenderness.  LUNGS: Normal breath sounds bilaterally, no wheezing, rales,rhonchi or crepitation. No use of accessory muscles of respiration.  CARDIOVASCULAR: S1, S2 normal. No murmurs, rubs, or gallops.  ABDOMEN: Soft, non-tender, non-distended. Bowel sounds present. No organomegaly or mass. PEG + EXTREMITIES: No pedal edema, cyanosis, or clubbing.  NEUROLOGIC: Cranial nerves II through XII are intact. Muscle strength 5/5 in all extremities. Sensation intact. Gait not checked.  PSYCHIATRIC: The patient is alert and oriented x 3.  SKIN: No obvious rash, lesion, or ulcer.   DATA REVIEW:   CBC   Recent Labs Lab 04/24/15 0553  WBC 12.2*  HGB 10.6*  HCT 32.7*  PLT 132*    Chemistries   Recent Labs Lab 04/23/15 1137 04/24/15 0553  NA 137 141  K 4.5 3.8  CL 97* 106  CO2 31 30  GLUCOSE 190* 122*  BUN 20 19  CREATININE 0.82 0.74  CALCIUM 9.0 8.1*  AST 24  --   ALT  16*  --   ALKPHOS 100  --   BILITOT 0.6  --     Microbiology Results   Recent Results (from the past 240 hour(s))  Blood Culture (routine x 2)     Status: None (Preliminary result)   Collection Time: 04/23/15 11:37 AM  Result Value Ref Range Status   Specimen Description BLOOD LEFT ARM  Final   Special Requests BOTTLES DRAWN AEROBIC AND ANAEROBIC 3CC  Final   Culture NO GROWTH 2 DAYS  Final   Report Status PENDING  Incomplete  Blood Culture (routine x 2)     Status: None (Preliminary result)   Collection Time: 04/23/15 11:37 AM  Result Value Ref Range Status   Specimen Description BLOOD RIGHT ARM  Final   Special Requests BOTTLES DRAWN AEROBIC AND ANAEROBIC 5CC  Final   Culture NO GROWTH 2 DAYS  Final   Report Status PENDING  Incomplete  Urine culture     Status: None (Preliminary result)   Collection Time: 04/23/15 12:52 PM  Result Value Ref Range Status   Specimen Description URINE, CLEAN CATCH  Final   Special Requests NONE  Final    Culture NO GROWTH < 24 HOURS  Final   Report Status PENDING  Incomplete  Culture, expectorated sputum-assessment     Status: None   Collection Time: 04/24/15 11:12 AM  Result Value Ref Range Status   Specimen Description SPUTUM  Final   Special Requests Immunocompromised  Final   Sputum evaluation   Final    Sputum specimen not acceptable for testing.  Please recollect.   Results Called to: [facility] 04/24/15 0135 [facility]    Report Status 04/24/2015 FINAL  Final  Culture, expectorated sputum-assessment     Status: None (Preliminary result)   Collection Time: 04/24/15  7:23 PM  Result Value Ref Range Status   Specimen Description SPUTUM  Final   Special Requests NONE  Final   Sputum evaluation THIS SPECIMEN IS ACCEPTABLE FOR SPUTUM CULTURE  Final   Report Status PENDING  Incomplete    RADIOLOGY:  Dg Chest Port 1 View  04/23/2015   CLINICAL DATA:  Fever. Tachycardia and confusion. History of aspiration pneumonia.  EXAM: PORTABLE CHEST - 1 VIEW  COMPARISON:  11/20/2014  FINDINGS: Abnormal airspace opacity at the right lung base and right midlung, and at the left lung base. Faintly accentuated interstitium.  Cardiac and mediastinal margins appear normal. No pleural effusion identified.  IMPRESSION: 1. Bibasilar and right mid lung airspace opacities concerning for multilobar pneumonia or aspiration pneumonitis.   Electronically Signed   By: Van Clines M.D.   On: 04/23/2015 12:20   Management plans discussed with the patient, family and they are in agreement.  CODE STATUS:     Code Status Orders        Start     Ordered   04/23/15 1300  Full code   Continuous     04/23/15 1301      TOTAL TIME TAKING CARE OF THIS PATIENT: 40 minutes.    Carman Essick M.D on 04/25/2015 at 9:17 AM  Between 7am to 6pm - Pager - [phone] After 6pm go to [email or web address] - password [email or web address] [facility]  Office  [phone]  CC: Primary care physician; Isaias Cowman,  PA-C

## 2015-04-25 NOTE — Discharge Instructions (Signed)
NPO Cont your PEG feeding as before

## 2015-04-25 NOTE — Plan of Care (Signed)
Problem: Discharge Progression Outcomes Goal: Other Discharge Outcomes/Goals Outcome: Completed/Met Date Met:  04/25/15 Plan of care progress to goal Patient continues on  ABX. Breathing tx continued. Patient is tolerating bolus tube feedings well  Pt ambulates outside room with no SOB Patient c/o leg pain x1 relieved well with prn oxycodone  Received MD order to discharge patient to home, prescriptions and discharge instruction reviewed with patient and her verbalized understanding patient discharged with wife to home in wheelchair -

## 2015-04-28 LAB — CULTURE, BLOOD (ROUTINE X 2)
CULTURE: NO GROWTH
CULTURE: NO GROWTH

## 2015-05-08 ENCOUNTER — Encounter: Payer: Self-pay | Admitting: Emergency Medicine

## 2015-05-08 ENCOUNTER — Emergency Department
Admission: EM | Admit: 2015-05-08 | Discharge: 2015-05-08 | Disposition: A | Discharge status: Home / Self Care | Payer: Medicaid Other | Attending: Student | Admitting: Student

## 2015-05-08 DIAGNOSIS — W010XXA Fall on same level from slipping, tripping and stumbling without subsequent striking against object, initial encounter: Secondary | ICD-10-CM | POA: Insufficient documentation

## 2015-05-08 DIAGNOSIS — Y998 Other external cause status: Secondary | ICD-10-CM | POA: Insufficient documentation

## 2015-05-08 DIAGNOSIS — Z720 Tobacco use: Secondary | ICD-10-CM | POA: Insufficient documentation

## 2015-05-08 DIAGNOSIS — Y92511 Restaurant or cafe as the place of occurrence of the external cause: Secondary | ICD-10-CM | POA: Insufficient documentation

## 2015-05-08 DIAGNOSIS — S20229A Contusion of unspecified back wall of thorax, initial encounter: Secondary | ICD-10-CM

## 2015-05-08 DIAGNOSIS — Y9301 Activity, walking, marching and hiking: Secondary | ICD-10-CM | POA: Insufficient documentation

## 2015-05-08 DIAGNOSIS — S3992XA Unspecified injury of lower back, initial encounter: Secondary | ICD-10-CM | POA: Diagnosis present

## 2015-05-08 DIAGNOSIS — Z7951 Long term (current) use of inhaled steroids: Secondary | ICD-10-CM | POA: Diagnosis not present

## 2015-05-08 DIAGNOSIS — Z791 Long term (current) use of non-steroidal anti-inflammatories (NSAID): Secondary | ICD-10-CM | POA: Insufficient documentation

## 2015-05-08 DIAGNOSIS — Z7982 Long term (current) use of aspirin: Secondary | ICD-10-CM | POA: Insufficient documentation

## 2015-05-08 DIAGNOSIS — S300XXA Contusion of lower back and pelvis, initial encounter: Secondary | ICD-10-CM | POA: Insufficient documentation

## 2015-05-08 DIAGNOSIS — Z79899 Other long term (current) drug therapy: Secondary | ICD-10-CM | POA: Diagnosis not present

## 2015-05-08 MED ORDER — CYCLOBENZAPRINE HCL 5 MG PO TABS: 5.0000 mg | ORAL_TABLET | Freq: Three times a day (TID) | ORAL | Status: DC | PRN

## 2015-05-08 MED ORDER — NAPROXEN 500 MG PO TBEC: 500.0000 mg | DELAYED_RELEASE_TABLET | Freq: Two times a day (BID) | ORAL | Status: DC

## 2015-05-08 NOTE — Discharge Instructions (Signed)
Contusion A contusion is a deep bruise. Contusions happen when an injury causes bleeding under the skin. Signs of bruising include pain, puffiness (swelling), and discolored skin. The contusion may turn blue, purple, or yellow. HOME CARE   Put ice on the injured area.  Put ice in a plastic bag.  Place a towel between your skin and the bag.  Leave the ice on for 15-20 minutes, 03-04 times a day.  Only take medicine as told by your doctor.  Rest the injured area.  If possible, raise (elevate) the injured area to lessen puffiness. GET HELP RIGHT AWAY IF:   You have more bruising or puffiness.  You have pain that is getting worse.  Your puffiness or pain is not helped by medicine. MAKE SURE YOU:   Understand these instructions.  Will watch your condition.  Will get help right away if you are not doing well or get worse. Document Released: 01/21/2008 Document Revised: 10/27/2011 Document Reviewed: 06/09/2011 [facility] Patient Information 2015 [place], [place]. This information is not intended to replace advice given to you by your health care provider. Make sure you discuss any questions you have with your health care provider.  Fall Prevention and Home Safety Falls cause injuries and can affect all age groups. It is possible to prevent falls.  HOW TO PREVENT FALLS  Wear shoes with rubber soles that do not have an opening for your toes.  Keep the inside and outside of your house well lit.  Use night lights throughout your home.  Remove clutter from floors.  Clean up floor spills.  Remove throw rugs or fasten them to the floor with carpet tape.  Do not place electrical cords across pathways.  Put grab bars by your tub, shower, and toilet. Do not use towel bars as grab bars.  Put handrails on both sides of the stairway. Fix loose handrails.  Do not climb on stools or stepladders, if possible.  Do not wax your floors.  Repair uneven or unsafe sidewalks, walkways, or  stairs.  Keep items you use a lot within reach.  Be aware of pets.  Keep emergency numbers next to the telephone.  Put smoke detectors in your home and near bedrooms. Ask your doctor what other things you can do to prevent falls. Document Released: 05/31/2009 Document Revised: 02/03/2012 Document Reviewed: 11/04/2011 [facility] Patient Information 2015 [place], [place]. This information is not intended to replace advice given to you by your health care provider. Make sure you discuss any questions you have with your health care provider.  Take the prescription meds as directed, along with your home pain meds.  Apply ice to reduce symptoms. Follow-up with your provider or return as needed for worsening symptoms.

## 2015-05-08 NOTE — ED Notes (Signed)
Patient to ER for c/o lower back pain. Patient states he was at [facility] yesterday and went to restroom. States there was a large amount of water on floor and patient fell, hitting back. Now having pain to lower back that shoots down both legs.

## 2015-05-08 NOTE — ED Provider Notes (Signed)
[facility] Emergency Department Provider Note ____________________________________________  Time seen: 1455  I have reviewed the triage vital signs and the nursing notes.  HISTORY  Chief Complaint  Back Pain  HPI Mark [name] is a 59 y.o. male who reports to the ED for evaluation of injury sustained after an alleged slip and fall at a local restaurant yesterday. He describes he was walking to the restroom at a local [employer], and they were standing water on the floor, with no sign to indicate a wet floor hazard. He reportedly slipped falling onto his buttocks and landing on his propped elbows. He was able to get up under his own power.He report the incident to the store manager, who subsequently took his name and number, and suggested that he keep in touch if he should have any symptoms. He presents himself to the ED today for treatment of pain to the lower back. He is a cancer patient with chronic pain, who recommended doses oxycodone, Wellbutrin, and Lyrica. He denies any urinary incontinence, or lower extremity weakness. He rates his pain at a 10/10 in triage.  Past Medical History  Diagnosis Date  . Cancer     tonsilal ca  . Aspiration pneumonia   . Tonsillar cancer, S/P surgery, chemotherapy XRT 2009-followed at [facility] 11/18/2014  . Peripheral neuropathy 11/18/2014  . Memory deficit 11/18/2014  . PEG tube  11/18/2014  . Incontinence     night time, began recently    Patient Active Problem List   Diagnosis Date Noted  . Sepsis 04/23/2015  . Aspiration pneumonia 11/18/2014  . Tonsillar cancer, S/P surgery, chemotherapy XRT 2009-followed at [facility] 11/18/2014  . Peripheral neuropathy 11/18/2014  . Adult failure to thrive 11/18/2014  . Recurrent aspiration pneumonia 11/18/2014  . Memory deficit 11/18/2014  . PEG tube  11/18/2014    Past Surgical History  Procedure Laterality Date  . Appendectomy    . Cholecystectomy    . Tonsillectomy      jan  2010--stage IV    Current Outpatient Rx  Name  Route  Sig  Dispense  Refill  . albuterol (PROVENTIL HFA;VENTOLIN HFA) 108 (90 BASE) MCG/ACT inhaler   Inhalation   Inhale 2 puffs into the lungs every 6 (six) hours as needed for wheezing or shortness of breath.          . ARIPiprazole (ABILIFY) 2 MG tablet   PEG Tube   2 mg by PEG Tube route daily.          Marland Kitchen aspirin EC 81 MG tablet   PEG Tube   81 mg by PEG Tube route daily.         . budesonide-formoterol (SYMBICORT) 160-4.5 MCG/ACT inhaler   Inhalation   Inhale 2 puffs into the lungs 2 (two) times daily.         Marland Kitchen buPROPion (WELLBUTRIN SR) 150 MG 12 hr tablet   PEG Tube   150 mg by PEG Tube route 2 (two) times daily.         . clonazePAM (KLONOPIN) 1 MG tablet   PEG Tube   1 mg by PEG Tube route 3 (three) times daily as needed for anxiety.          . cyclobenzaprine (FLEXERIL) 5 MG tablet   Oral   Take 1 tablet (5 mg total) by mouth every 8 (eight) hours as needed for muscle spasms.   12 tablet   0   . Ipratropium-Albuterol (COMBIVENT) 20-100 MCG/ACT AERS respimat  Inhalation   Inhale 2 puffs into the lungs 2 (two) times daily.         Marland Kitchen levofloxacin (LEVAQUIN) 500 MG tablet   Oral   Take 1 tablet (500 mg total) by mouth daily. Crush and take it thru peg tube   7 tablet   0   . mometasone (NASONEX) 50 MCG/ACT nasal spray   Each Nare   Place 2 sprays into both nostrils 2 (two) times daily.         . naproxen (EC NAPROSYN) 500 MG EC tablet   Oral   Take 1 tablet (500 mg total) by mouth 2 (two) times daily with a meal.   30 tablet   0   . Nutritional Supplements (FEEDING SUPPLEMENT, GLUCERNA 1.5 CAL,) LIQD   Per Tube   Place 237 mLs into feeding tube daily at 3 pm.   30 Can   0   . Nutritional Supplements (GLUCERNA 1.5 CAL PO)   PEG Tube   237 mLs by PEG Tube route See admin instructions. Pt uses up to seven times per day.         . Oxycodone HCl 20 MG TABS   PEG Tube   20 mg by PEG  Tube route every 4 (four) hours as needed (for pain).         . pantoprazole (PROTONIX) 40 MG tablet   PEG Tube   40 mg by PEG Tube route at bedtime.          . pregabalin (LYRICA) 300 MG capsule   Oral   Take 300 mg by mouth 2 (two) times daily.         . traZODone (DESYREL) 150 MG tablet   PEG Tube   150 mg by PEG Tube route at bedtime.         Marland Kitchen venlafaxine (EFFEXOR) 37.5 MG tablet   PEG Tube   37.5 mg by PEG Tube route 2 (two) times daily.         . Water For Irrigation, Sterile (FREE WATER) SOLN   Per Tube   Place 200 mLs into feeding tube every 8 (eight) hours.   1000 mL   0    Allergies Morphine and related  Family History  Problem Relation Age of Onset  . Liver cancer Mother   . Stroke Father   . Testicular cancer Father     Social History Social History  Substance Use Topics  . Smoking status: Current Every Day Smoker  . Smokeless tobacco: Never Used  . Alcohol Use: No   Review of Systems  Constitutional: Negative for fever. Eyes: Negative for visual changes. ENT: Negative for sore throat. Cardiovascular: Negative for chest pain. Respiratory: Negative for shortness of breath. Gastrointestinal: Negative for abdominal pain, vomiting and diarrhea. Genitourinary: Negative for dysuria. Musculoskeletal: Negative for back pain. Skin: Negative for rash. Neurological: Negative for headaches, focal weakness or numbness. ____________________________________________  PHYSICAL EXAM:  VITAL SIGNS: ED Triage Vitals  Enc Vitals Group     BP 05/08/15 1328 104/62 mmHg     Pulse Rate 05/08/15 1328 97     Resp 05/08/15 1328 18     Temp 05/08/15 1328 97.7 F (36.5 C)     Temp Source 05/08/15 1328 Oral     SpO2 05/08/15 1328 99 %     Weight 05/08/15 1328 155 lb (70.308 kg)     Height 05/08/15 1328 6' (1.829 m)     Head Cir --  Peak Flow --      Pain Score 05/08/15 1329 9     Pain Loc --      Pain Edu? --      Excl. in [place]? --     Constitutional: Alert and oriented. Well appearing and in no distress. Eyes: Conjunctivae are normal. PERRL. Normal extraocular movements. ENT   Head: Normocephalic and atraumatic.   Nose: No congestion/rhinorrhea.   Mouth/Throat: Mucous membranes are moist.   Neck: Supple. No thyromegaly. Hematological/Lymphatic/Immunological: No cervical lymphadenopathy. Cardiovascular: Normal rate, regular rhythm.  Respiratory: Normal respiratory effort. No wheezes/rales/rhonchi. Gastrointestinal: Soft and nontender. No distention. Musculoskeletal: Normal spinal alignment without obvious abrasion, effusion, deformity, or step-off. Minimal tenderness to palp over the sacral region, without significant lower extremity discomfort. Normal toe raise normal here is is appreciated. Nontender with normal range of motion in all extremities.  Neurologic:  Cranial nerves II through XII grossly intact normal DTRs lotion any bilaterally. Normal gait without ataxia. Normal speech and language. No gross focal neurologic deficits are appreciated. Skin:  Skin is warm, dry and intact. No rash noted. Psychiatric: Mood and affect are normal. Patient exhibits appropriate insight and judgment. ____________________________________________  INITIAL IMPRESSION / ASSESSMENT AND PLAN / ED COURSE  Patient with back contusion without significant indication of neurological deficit. He will dose his home medications, and the provided with a prescription for Flexeril and Naprosyn for adjunctive pain relief. ____________________________________________  FINAL CLINICAL IMPRESSION(S) / ED DIAGNOSES  Final diagnoses:  Fall due to wet surface, initial encounter  Back contusion, unspecified laterality, initial encounter      Melvenia Needles, PA-C 05/09/15 1622  Joanne Gavel, MD 05/12/15 [phone]

## 2015-05-30 DIAGNOSIS — Z7189 Other specified counseling: Secondary | ICD-10-CM | POA: Insufficient documentation

## 2015-05-30 DIAGNOSIS — R350 Frequency of micturition: Secondary | ICD-10-CM | POA: Insufficient documentation

## 2015-05-30 DIAGNOSIS — R351 Nocturia: Secondary | ICD-10-CM | POA: Insufficient documentation

## 2015-07-27 LAB — CULTURE, RESPIRATORY W GRAM STAIN

## 2015-07-27 LAB — CULTURE, RESPIRATORY

## 2015-09-04 ENCOUNTER — Inpatient Hospital Stay: Payer: Medicaid Other | Attending: Oncology

## 2015-09-04 ENCOUNTER — Inpatient Hospital Stay: Payer: Medicaid Other | Admitting: Oncology

## 2015-09-13 ENCOUNTER — Ambulatory Visit (INDEPENDENT_AMBULATORY_CARE_PROVIDER_SITE_OTHER): Payer: Medicaid Other | Admitting: Gastroenterology

## 2015-09-13 ENCOUNTER — Other Ambulatory Visit: Payer: Self-pay

## 2015-09-13 ENCOUNTER — Encounter (INDEPENDENT_AMBULATORY_CARE_PROVIDER_SITE_OTHER): Payer: Self-pay

## 2015-09-13 ENCOUNTER — Encounter: Payer: Self-pay | Admitting: Gastroenterology

## 2015-09-13 VITALS — BP 95/65 | HR 79 | Ht 72.0 in | Wt 160.0 lb

## 2015-09-13 DIAGNOSIS — K9423 Gastrostomy malfunction: Secondary | ICD-10-CM | POA: Diagnosis not present

## 2015-09-13 NOTE — Progress Notes (Signed)
Primary Care Physician: Isaias Cowman, PA-C  Primary Gastroenterologist:  Dr. [name]  Chief Complaint  Patient presents with  . Follow up peg tube    HPI: Mark [name] is a 60 y.o. male here for follow-up of his PEG tube. The patient was scheduled for an office visit due to a suspected infected PEG site. The patient denies any redness or pain around the PEG site and states he is really here because the PEG tube is not working as it had been in the past with decreased flow. He denies any fevers chills nausea or vomiting. He also denies any bleeding around the PEG site or drainage.  Current Outpatient Prescriptions  Medication Sig Dispense Refill  . albuterol (PROVENTIL HFA;VENTOLIN HFA) 108 (90 BASE) MCG/ACT inhaler Inhale 2 puffs into the lungs every 6 (six) hours as needed for wheezing or shortness of breath.     . ARIPiprazole (ABILIFY) 2 MG tablet 2 mg by PEG Tube route daily.     . budesonide-formoterol (SYMBICORT) 160-4.5 MCG/ACT inhaler Inhale 2 puffs into the lungs 2 (two) times daily.    Marland Kitchen buPROPion (WELLBUTRIN SR) 150 MG 12 hr tablet 150 mg by PEG Tube route 2 (two) times daily.    . clonazePAM (KLONOPIN) 1 MG tablet 1 mg by PEG Tube route 3 (three) times daily as needed for anxiety.     . Ipratropium-Albuterol (COMBIVENT) 20-100 MCG/ACT AERS respimat Inhale 2 puffs into the lungs 2 (two) times daily.    . mometasone (NASONEX) 50 MCG/ACT nasal spray Place 2 sprays into both nostrils 2 (two) times daily.    . naproxen (EC NAPROSYN) 500 MG EC tablet Take 1 tablet (500 mg total) by mouth 2 (two) times daily with a meal. 30 tablet 0  . Nutritional Supplements (FEEDING SUPPLEMENT, GLUCERNA 1.5 CAL,) LIQD Place 237 mLs into feeding tube daily at 3 pm. 30 Can 0  . Nutritional Supplements (GLUCERNA 1.5 CAL PO) 237 mLs by PEG Tube route See admin instructions. Pt uses up to seven times per day.    . Oxycodone HCl 20 MG TABS 20 mg by PEG Tube route every 4 (four) hours as needed  (for pain).    . pantoprazole (PROTONIX) 40 MG tablet 40 mg by PEG Tube route at bedtime.     . pregabalin (LYRICA) 300 MG capsule Take 300 mg by mouth 2 (two) times daily.    . traZODone (DESYREL) 150 MG tablet 150 mg by PEG Tube route at bedtime.    Marland Kitchen venlafaxine (EFFEXOR) 37.5 MG tablet 37.5 mg by PEG Tube route 2 (two) times daily.    . Water For Irrigation, Sterile (FREE WATER) SOLN Place 200 mLs into feeding tube every 8 (eight) hours. 1000 mL 0  . aspirin EC 81 MG tablet 81 mg by PEG Tube route daily. Reported on 09/13/2015    . cyclobenzaprine (FLEXERIL) 5 MG tablet Take 1 tablet (5 mg total) by mouth every 8 (eight) hours as needed for muscle spasms. (Patient not taking: Reported on 09/13/2015) 12 tablet 0   No current facility-administered medications for this visit.    Allergies as of 09/13/2015 - Review Complete 09/13/2015  Allergen Reaction Noted  . Morphine and related Nausea And Vomiting 11/18/2014    ROS:  General: Negative for anorexia, weight loss, fever, chills, fatigue, weakness. ENT: Negative for hoarseness, difficulty swallowing , nasal congestion. CV: Negative for chest pain, angina, palpitations, dyspnea on exertion, peripheral edema.  Respiratory: Negative for dyspnea at rest,  dyspnea on exertion, cough, sputum, wheezing.  GI: See history of present illness. GU:  Negative for dysuria, hematuria, urinary incontinence, urinary frequency, nocturnal urination.  Endo: Negative for unusual weight change.    Physical Examination:   BP 95/65 mmHg  Pulse 79  Ht 6' (1.829 m)  Wt 160 lb (72.576 kg)  BMI 21.70 kg/m2  General: Well-nourished, well-developed in no acute distress.  Eyes: No icterus. Conjunctivae pink. Mouth: Oropharyngeal mucosa moist and pink , no lesions erythema or exudate. Abdomen: Bowel sounds are normal, nontender, nondistended, no hepatosplenomegaly or masses, no abdominal bruits or hernia , no rebound or guarding.   PEG site looks good. The PEG  tube appears to have some retained material in it Extremities: No lower extremity edema. No clubbing or deformities. Neuro: Alert and oriented x 3.  Grossly intact. Skin: Warm and dry, no jaundice.   Psych: Alert and cooperative, normal mood and affect.  Labs:    Imaging Studies: No results found.  Assessment and Plan:   Mark [name] is a 60 y.o. y/o male  who has a PEG tube that has been having some dysfunction with slow drainage. The patient will be set up for a PEG tube change. The patient has been explained the plan and agrees with it.    Note: This dictation was prepared with Dragon dictation along with smaller phrase technology. Any transcriptional errors that result from this process are unintentional.

## 2015-09-20 ENCOUNTER — Inpatient Hospital Stay: Payer: Medicaid Other

## 2015-09-20 ENCOUNTER — Telehealth: Payer: Self-pay | Admitting: *Deleted

## 2015-09-20 ENCOUNTER — Inpatient Hospital Stay: Payer: Medicaid Other | Admitting: Oncology

## 2015-09-20 NOTE — Telephone Encounter (Signed)
Called to report he has an appt this afternoon, but he has aspiration pneumonia. He is asking if we want him to keep this appt or to reschedule because he is coughing up ' nasty stuff"

## 2015-09-20 NOTE — Telephone Encounter (Signed)
Wait a week per Dr Grayland Ormond. I called pt and left VM to call back to reschedule his appt

## 2015-10-02 ENCOUNTER — Inpatient Hospital Stay: Payer: Medicaid Other | Attending: Oncology

## 2015-10-02 ENCOUNTER — Inpatient Hospital Stay (HOSPITAL_BASED_OUTPATIENT_CLINIC_OR_DEPARTMENT_OTHER): Payer: Medicaid Other | Admitting: Oncology

## 2015-10-02 ENCOUNTER — Encounter: Payer: Self-pay | Admitting: Oncology

## 2015-10-02 VITALS — BP 100/63 | HR 89 | Temp 98.7°F | Wt 160.1 lb

## 2015-10-02 DIAGNOSIS — Z923 Personal history of irradiation: Secondary | ICD-10-CM

## 2015-10-02 DIAGNOSIS — Z9221 Personal history of antineoplastic chemotherapy: Secondary | ICD-10-CM

## 2015-10-02 DIAGNOSIS — F419 Anxiety disorder, unspecified: Secondary | ICD-10-CM

## 2015-10-02 DIAGNOSIS — Z85818 Personal history of malignant neoplasm of other sites of lip, oral cavity, and pharynx: Secondary | ICD-10-CM | POA: Insufficient documentation

## 2015-10-02 DIAGNOSIS — Z79899 Other long term (current) drug therapy: Secondary | ICD-10-CM

## 2015-10-02 DIAGNOSIS — Z87891 Personal history of nicotine dependence: Secondary | ICD-10-CM | POA: Insufficient documentation

## 2015-10-02 DIAGNOSIS — C760 Malignant neoplasm of head, face and neck: Secondary | ICD-10-CM

## 2015-10-02 DIAGNOSIS — G629 Polyneuropathy, unspecified: Secondary | ICD-10-CM | POA: Insufficient documentation

## 2015-10-02 LAB — CBC WITH DIFFERENTIAL/PLATELET
BASOS ABS: 0.1 10*3/uL (ref 0–0.1)
BASOS PCT: 1 %
Eosinophils Absolute: 0.1 10*3/uL (ref 0–0.7)
Eosinophils Relative: 2 %
HEMATOCRIT: 37.5 % — AB (ref 40.0–52.0)
HEMOGLOBIN: 12.5 g/dL — AB (ref 13.0–18.0)
LYMPHS PCT: 13 %
Lymphs Abs: 1 10*3/uL (ref 1.0–3.6)
MCH: 29.4 pg (ref 26.0–34.0)
MCHC: 33.3 g/dL (ref 32.0–36.0)
MCV: 88.2 fL (ref 80.0–100.0)
Monocytes Absolute: 0.3 10*3/uL (ref 0.2–1.0)
Monocytes Relative: 4 %
NEUTROS ABS: 6.6 10*3/uL — AB (ref 1.4–6.5)
NEUTROS PCT: 82 %
Platelets: 459 10*3/uL — ABNORMAL HIGH (ref 150–440)
RBC: 4.26 MIL/uL — AB (ref 4.40–5.90)
RDW: 14.8 % — AB (ref 11.5–14.5)
WBC: 8.1 10*3/uL (ref 3.8–10.6)

## 2015-10-02 LAB — BASIC METABOLIC PANEL
ANION GAP: 7 (ref 5–15)
BUN: 20 mg/dL (ref 6–20)
CHLORIDE: 99 mmol/L — AB (ref 101–111)
CO2: 32 mmol/L (ref 22–32)
CREATININE: 0.88 mg/dL (ref 0.61–1.24)
Calcium: 9.1 mg/dL (ref 8.9–10.3)
GFR calc non Af Amer: >60 mL/min (ref >60)
Glucose, Bld: 192 mg/dL — ABNORMAL HIGH (ref 65–99)
POTASSIUM: 4.4 mmol/L (ref 3.5–5.1)
Sodium: 138 mmol/L (ref 135–145)

## 2015-10-02 LAB — TSH: TSH: 0.699 u[IU]/mL (ref 0.350–4.500)

## 2015-10-03 LAB — T4: T4 TOTAL: 10.6 ug/dL (ref 4.5–12.0)

## 2015-10-12 NOTE — Progress Notes (Signed)
Mark [name] OB: 13-Jan-1956  MR#: [id number]  [id number]  Patient Care Team: Secundino Ginger, PA-C as PCP - General (Cardiology)  CHIEF COMPLAINT:  Chief Complaint  Patient presents with  . head and neck cancer    INTERVAL HISTORY: Patient returns to clinic today for routine 6 month evaluation.  He continues to require much of his nutrition via PEG tube. He states his oral intake has improved, but he still aspirates.  He otherwise feels well and is asymptomatic.  He denies any dysphasia.  He continues to have dry mouth.  He has no neurologic complaints.  He denies any chest pain or shortness of breath.  He has no nausea, vomiting, constipation, or diarrhea.  He has no urinary complaints.  Patient offers no further specific complaints.  REVIEW OF SYSTEMS:   Review of Systems  Constitutional: Negative for weight loss and malaise/fatigue.  HENT: Negative.  Negative for sore throat.   Respiratory: Negative.  Negative for shortness of breath, wheezing and stridor.   Cardiovascular: Negative.  Negative for chest pain.  Gastrointestinal: Negative.   Musculoskeletal: Negative.   Neurological: Negative.  Negative for weakness and headaches.    As per HPI. Otherwise, a complete review of systems is negatve.  PAST MEDICAL HISTORY: Past Medical History  Diagnosis Date  . Cancer ([place])     tonsilal ca  . Aspiration pneumonia ([place])   . Tonsillar cancer, S/P surgery, chemotherapy XRT 2009-followed at [facility] 11/18/2014  . Peripheral neuropathy ([place]) 11/18/2014  . Memory deficit 11/18/2014  . PEG tube  11/18/2014  . Incontinence     night time, began recently    PAST SURGICAL HISTORY: Past Surgical History  Procedure Laterality Date  . Appendectomy    . Cholecystectomy    . Tonsillectomy      jan 2010--stage IV    FAMILY HISTORY Family History  Problem Relation Age of Onset  . Liver cancer Mother     . Stroke Father   . Testicular cancer Father        ADVANCED DIRECTIVES:    HEALTH MAINTENANCE: Social History  Substance Use Topics  . Smoking status: Former Research scientist (life sciences)  . Smokeless tobacco: Never Used  . Alcohol Use: No     Colonoscopy:  PAP:  Bone density:  Lipid panel:  Allergies  Allergen Reactions  . Morphine And Related Nausea And Vomiting    Current Outpatient Prescriptions  Medication Sig Dispense Refill  . albuterol (PROVENTIL HFA;VENTOLIN HFA) 108 (90 BASE) MCG/ACT inhaler Inhale 2 puffs into the lungs every 6 (six) hours as needed for wheezing or shortness of breath.     . ARIPiprazole (ABILIFY) 2 MG tablet 2 mg by PEG Tube route daily.     Marland Kitchen aspirin EC 81 MG tablet 81 mg by PEG Tube route daily. Reported on 09/13/2015    . budesonide-formoterol (SYMBICORT) 160-4.5 MCG/ACT inhaler Inhale 2 puffs into the lungs 2 (two) times daily.    Marland Kitchen buPROPion (WELLBUTRIN SR) 150 MG 12 hr tablet 150 mg by PEG Tube route 2 (two) times daily.    . chlorproMAZINE (THORAZINE) 25 MG tablet Take 25 mg by mouth 4 (four) times daily.  0  . clonazePAM (KLONOPIN) 1 MG tablet 1 mg by PEG Tube route 3 (three) times daily as needed for anxiety.     . cyclobenzaprine (FLEXERIL) 5 MG tablet Take 1 tablet (5 mg total) by mouth every 8 (eight) hours as  needed for muscle spasms. 12 tablet 0  . Ipratropium-Albuterol (COMBIVENT) 20-100 MCG/ACT AERS respimat Inhale 2 puffs into the lungs 2 (two) times daily.    . mometasone (NASONEX) 50 MCG/ACT nasal spray Place 2 sprays into both nostrils 2 (two) times daily.    . naproxen (EC NAPROSYN) 500 MG EC tablet Take 1 tablet (500 mg total) by mouth 2 (two) times daily with a meal. 30 tablet 0  . Nutritional Supplements (FEEDING SUPPLEMENT, GLUCERNA 1.5 CAL,) LIQD Place 237 mLs into feeding tube daily at 3 pm. 30 Can 0  . Nutritional Supplements (GLUCERNA 1.5 CAL PO) 237 mLs by PEG Tube route See admin instructions. Pt uses up to seven times per day.    .  Oxycodone HCl 20 MG TABS 20 mg by PEG Tube route every 4 (four) hours as needed (for pain).    . pantoprazole (PROTONIX) 40 MG tablet 40 mg by PEG Tube route at bedtime.     . pregabalin (LYRICA) 300 MG capsule Take 300 mg by mouth 2 (two) times daily.    . traZODone (DESYREL) 150 MG tablet 150 mg by PEG Tube route at bedtime.    Marland Kitchen venlafaxine (EFFEXOR) 37.5 MG tablet 37.5 mg by PEG Tube route 2 (two) times daily.    . Water For Irrigation, Sterile (FREE WATER) SOLN Place 200 mLs into feeding tube every 8 (eight) hours. 1000 mL 0   No current facility-administered medications for this visit.    OBJECTIVE: Filed Vitals:   10/02/15 1059  BP: 100/63  Pulse: 89  Temp: 98.7 F (37.1 C)     Body mass index is 21.7 kg/(m^2).    ECOG FS:0 - Asymptomatic  General: Well-developed, well-nourished, no acute distress. Eyes: Pink conjunctiva, anicteric sclera. HEENT: Normocephalic, moist mucous membranes, clear oropharnyx. No palpable lymphadenopathy. Lungs: Clear to auscultation bilaterally. Heart: Regular rate and rhythm. No rubs, murmurs, or gallops. Abdomen: Soft, nontender, nondistended. No organomegaly noted, normoactive bowel sounds. PEG tube noted without erythema. Musculoskeletal: No edema, cyanosis, or clubbing. Neuro: Alert, answering all questions appropriately. Cranial nerves grossly intact. Skin: No rashes or petechiae noted. Psych: Normal affect.   LAB RESULTS:  Lab Results  Component Value Date   NA 138 10/02/2015   K 4.4 10/02/2015   CL 99* 10/02/2015   CO2 32 10/02/2015   GLUCOSE 192* 10/02/2015   BUN 20 10/02/2015   CREATININE 0.88 10/02/2015   CALCIUM 9.1 10/02/2015   PROT 7.5 04/23/2015   ALBUMIN 4.1 04/23/2015   AST 24 04/23/2015   ALT 16* 04/23/2015   ALKPHOS 100 04/23/2015   BILITOT 0.6 04/23/2015   GFRNONAA >60 10/02/2015   GFRAA >60 10/02/2015    Lab Results  Component Value Date   WBC 8.1 10/02/2015   NEUTROABS 6.6* 10/02/2015   HGB 12.5*  10/02/2015   HCT 37.5* 10/02/2015   MCV 88.2 10/02/2015   PLT 459* 10/02/2015     STUDIES: No results found.  ASSESSMENT: Stage IVa, squamous cell carcinoma of left tonsil.  PLAN:    1. SCC:  No evidence of disease.  Patient reports recent endoscopy by ENT was normal.  Patient is now nearly 7 years from completing his treatment which was in June 2010, therefore no further imaging follow-up is necessary. Continue follow-up with ENT if necessary. 2.  Neuropathy:  Continue Lyrica 3.  Depression/anxiety: Treatment per behavior health. 4.  Aspiration: Continue to use PEG tube as directed. Oral intake per speech pathology.   Patient expressed understanding and  was in agreement with this plan. He also understands that He can call clinic at any time with any questions, concerns, or complaints.   Tonsillar cancer, S/P surgery, chemotherapy XRT 2009-followed at [place] form: Pharynx - Oropharynx, AJCC 7th Edition     Clinical stage from 03/17/2015: Stage IVA (T4a, N2, M0) - Signed by Lloyd Huger, MD on 03/17/2015   Lloyd Huger, MD   10/12/2015 10:09 AM

## 2015-10-30 ENCOUNTER — Encounter: Admission: RE | Payer: Self-pay | Source: Ambulatory Visit

## 2015-10-30 ENCOUNTER — Ambulatory Visit: Admission: RE | Admit: 2015-10-30 | Payer: Medicaid Other | Source: Ambulatory Visit | Admitting: Gastroenterology

## 2015-10-30 SURGERY — ESOPHAGOGASTRODUODENOSCOPY (EGD) WITH PROPOFOL
Anesthesia: General

## 2015-11-21 ENCOUNTER — Telehealth: Payer: Self-pay | Admitting: Gastroenterology

## 2015-11-21 NOTE — Telephone Encounter (Signed)
Spoke with Magda Paganini at [facility]. She will see if she can get a kit from the hospital to change the peg. Will let me know. Pt has been notified.

## 2015-11-21 NOTE — Telephone Encounter (Signed)
Patient needs appointment to have feeding tube replaced. He is waiting for you to call him back.

## 2015-11-22 ENCOUNTER — Other Ambulatory Visit: Payer: Self-pay

## 2015-11-22 NOTE — Telephone Encounter (Signed)
Pt is scheduled for an EGD with peg replacement on Wednesday, April 12th. Pt has been given instructions via telephone.

## 2015-11-26 ENCOUNTER — Encounter: Payer: Self-pay | Admitting: *Deleted

## 2015-11-27 NOTE — Discharge Instructions (Signed)

## 2015-11-28 ENCOUNTER — Ambulatory Visit
Admission: RE | Admit: 2015-11-28 | Discharge: 2015-11-28 | Disposition: A | Discharge status: Home / Self Care | Payer: Medicaid Other | Source: Ambulatory Visit | Attending: Gastroenterology | Admitting: Gastroenterology

## 2015-11-28 ENCOUNTER — Ambulatory Visit: Payer: Medicaid Other | Admitting: Anesthesiology

## 2015-11-28 ENCOUNTER — Encounter
Admission: RE | Disposition: A | Discharge status: Home / Self Care | Payer: Self-pay | Source: Ambulatory Visit | Attending: Gastroenterology

## 2015-11-28 DIAGNOSIS — K2970 Gastritis, unspecified, without bleeding: Secondary | ICD-10-CM | POA: Diagnosis not present

## 2015-11-28 DIAGNOSIS — Z85818 Personal history of malignant neoplasm of other sites of lip, oral cavity, and pharynx: Secondary | ICD-10-CM | POA: Diagnosis not present

## 2015-11-28 DIAGNOSIS — Z885 Allergy status to narcotic agent status: Secondary | ICD-10-CM | POA: Diagnosis not present

## 2015-11-28 DIAGNOSIS — Z79899 Other long term (current) drug therapy: Secondary | ICD-10-CM | POA: Diagnosis not present

## 2015-11-28 DIAGNOSIS — R1310 Dysphagia, unspecified: Secondary | ICD-10-CM | POA: Insufficient documentation

## 2015-11-28 DIAGNOSIS — Z77090 Contact with and (suspected) exposure to asbestos: Secondary | ICD-10-CM | POA: Diagnosis not present

## 2015-11-28 DIAGNOSIS — M1990 Unspecified osteoarthritis, unspecified site: Secondary | ICD-10-CM | POA: Diagnosis not present

## 2015-11-28 DIAGNOSIS — G629 Polyneuropathy, unspecified: Secondary | ICD-10-CM | POA: Insufficient documentation

## 2015-11-28 DIAGNOSIS — F17210 Nicotine dependence, cigarettes, uncomplicated: Secondary | ICD-10-CM | POA: Diagnosis not present

## 2015-11-28 DIAGNOSIS — F419 Anxiety disorder, unspecified: Secondary | ICD-10-CM | POA: Diagnosis not present

## 2015-11-28 DIAGNOSIS — K222 Esophageal obstruction: Secondary | ICD-10-CM | POA: Diagnosis not present

## 2015-11-28 DIAGNOSIS — K9423 Gastrostomy malfunction: Secondary | ICD-10-CM | POA: Diagnosis not present

## 2015-11-28 DIAGNOSIS — R252 Cramp and spasm: Secondary | ICD-10-CM | POA: Diagnosis not present

## 2015-11-28 DIAGNOSIS — Z7982 Long term (current) use of aspirin: Secondary | ICD-10-CM | POA: Diagnosis not present

## 2015-11-28 HISTORY — DX: Unspecified osteoarthritis, unspecified site: M19.90

## 2015-11-28 HISTORY — DX: Simple chronic bronchitis: J41.0

## 2015-11-28 HISTORY — PX: PEG PLACEMENT: SHX5437

## 2015-11-28 HISTORY — DX: Presence of dental prosthetic device (complete) (partial): Z97.2

## 2015-11-28 SURGERY — INSERTION, PEG TUBE
Anesthesia: Monitor Anesthesia Care | Wound class: Clean Contaminated

## 2015-11-28 MED ORDER — PROPOFOL 10 MG/ML IV BOLUS
INTRAVENOUS | Status: DC | PRN
  Administered 2015-11-28 (×3): 20 mg via INTRAVENOUS

## 2015-11-28 MED ORDER — LACTATED RINGERS IV SOLN
INTRAVENOUS | Status: DC
  Administered 2015-11-28: 07:00:00 via INTRAVENOUS

## 2015-11-28 MED ORDER — LACTATED RINGERS IV SOLN: INTRAVENOUS | Status: DC | PRN

## 2015-11-28 SURGICAL SUPPLY — 33 items
BALLN DILATOR 10-12 8 (BALLOONS)
BALLN DILATOR 12-15 8 (BALLOONS)
BALLN DILATOR 15-18 8 (BALLOONS)
BALLN DILATOR CRE 0-12 8 (BALLOONS)
BALLN DILATOR ESOPH 8 10 CRE (MISCELLANEOUS) IMPLANT
BALLOON DILATOR 12-15 8 (BALLOONS) IMPLANT
BALLOON DILATOR 15-18 8 (BALLOONS) IMPLANT
BALLOON DILATOR CRE 0-12 8 (BALLOONS) IMPLANT
BLOCK BITE 60FR ADLT L/F GRN (MISCELLANEOUS) ×3 IMPLANT
CANISTER SUCT 1200ML W/VALVE (MISCELLANEOUS) ×3 IMPLANT
CLIP HMST 235XBRD CATH ROT (MISCELLANEOUS) IMPLANT
CLIP RESOLUTION 360 11X235 (MISCELLANEOUS)
FCP ESCP3.2XJMB 240X2.8X (MISCELLANEOUS)
FORCEPS BIOP RAD 4 LRG CAP 4 (CUTTING FORCEPS) IMPLANT
FORCEPS BIOP RJ4 240 W/NDL (MISCELLANEOUS)
FORCEPS ESCP3.2XJMB 240X2.8X (MISCELLANEOUS) IMPLANT
GOWN CVR UNV OPN BCK APRN NK (MISCELLANEOUS) ×2 IMPLANT
GOWN ISOL THUMB LOOP REG UNIV (MISCELLANEOUS) ×6
INJECTOR VARIJECT VIN23 (MISCELLANEOUS) IMPLANT
KIT DEFENDO VALVE AND CONN (KITS) IMPLANT
KIT ENDO PROCEDURE OLY (KITS) ×3 IMPLANT
MARKER SPOT ENDO TATTOO 5ML (MISCELLANEOUS) IMPLANT
PAD GROUND ADULT SPLIT (MISCELLANEOUS) IMPLANT
RETRIEVER NET ROTH 2.5X230 LF (MISCELLANEOUS) ×2 IMPLANT
ROTH NET IMPLANT
SNARE SHORT THROW 13M SML OVAL (MISCELLANEOUS) IMPLANT
SNARE SHORT THROW 30M LRG OVAL (MISCELLANEOUS) IMPLANT
SPOT EX ENDOSCOPIC TATTOO (MISCELLANEOUS)
SYR INFLATION 60ML (SYRINGE) IMPLANT
VARIJECT INJECTOR VIN23 (MISCELLANEOUS)
WATER STERILE IRR 250ML POUR (IV SOLUTION) ×3 IMPLANT
WIDE-EYE POLYPTRAP (MISCELLANEOUS) IMPLANT
WIRE CRE 18-20MM 8CM F G (MISCELLANEOUS) IMPLANT

## 2015-11-28 NOTE — Anesthesia Postprocedure Evaluation (Signed)
Anesthesia Post Note  Patient: Mark [name]  Procedure(s) Performed: Procedure(s) (LRB): PERCUTANEOUS ENDOSCOPIC GASTROSTOMY (PEG) PLACEMENT (N/A)  Patient location during evaluation: PACU Anesthesia Type: MAC Level of consciousness: awake and alert and oriented Pain management: satisfactory to patient Vital Signs Assessment: post-procedure vital signs reviewed and stable Respiratory status: spontaneous breathing, nonlabored ventilation and respiratory function stable Cardiovascular status: blood pressure returned to baseline and stable Postop Assessment: Adequate PO intake and No signs of nausea or vomiting Anesthetic complications: no    Raliegh Ip

## 2015-11-28 NOTE — Anesthesia Procedure Notes (Signed)
Procedure Name: MAC Performed by: Audrick Lamoureaux Pre-anesthesia Checklist: Patient identified, Emergency Drugs available, Suction available, Timeout performed and Patient being monitored Patient Re-evaluated:Patient Re-evaluated prior to inductionOxygen Delivery Method: Nasal cannula Placement Confirmation: positive ETCO2     

## 2015-11-28 NOTE — Transfer of Care (Signed)
Immediate Anesthesia Transfer of Care Note  Patient: Mark [name]  Procedure(s) Performed: Procedure(s): PERCUTANEOUS ENDOSCOPIC GASTROSTOMY (PEG) PLACEMENT (N/A)  Patient Location: PACU  Anesthesia Type: MAC  Level of Consciousness: awake, alert  and patient cooperative  Airway and Oxygen Therapy: Patient Spontanous Breathing and Patient connected to supplemental oxygen  Post-op Assessment: Post-op Vital signs reviewed, Patient's Cardiovascular Status Stable, Respiratory Function Stable, Patent Airway and No signs of Nausea or vomiting  Post-op Vital Signs: Reviewed and stable  Complications: No apparent anesthesia complications

## 2015-11-28 NOTE — Anesthesia Preprocedure Evaluation (Signed)
Anesthesia Evaluation  Patient identified by MRN, date of birth, ID band  Reviewed: Allergy & Precautions, H&P , NPO status , Patient's Chart, lab work & pertinent test results  Airway Mallampati: II  TM Distance: >3 FB Neck ROM: full    Dental no notable dental hx.    Pulmonary pneumonia, Current Smoker,    Pulmonary exam normal        Cardiovascular  Rhythm:regular Rate:Normal     Neuro/Psych    GI/Hepatic   Endo/Other    Renal/GU      Musculoskeletal   Abdominal   Peds  Hematology   Anesthesia Other Findings   Reproductive/Obstetrics                             Anesthesia Physical Anesthesia Plan  ASA: IV  Anesthesia Plan: MAC   Post-op Pain Management:    Induction:   Airway Management Planned:   Additional Equipment:   Intra-op Plan:   Post-operative Plan:   Informed Consent: I have reviewed the patients History and Physical, chart, labs and discussed the procedure including the risks, benefits and alternatives for the proposed anesthesia with the patient or authorized representative who has indicated his/her understanding and acceptance.     Plan Discussed with: CRNA  Anesthesia Plan Comments:         Anesthesia Quick Evaluation

## 2015-11-28 NOTE — Op Note (Signed)
[facility]astroenterology Patient Name: Mark [name] Procedure Date: 11/28/2015 7:25 AM MRN: [id number] Account #: [id number] Date of Birth: 1955-10-20 Admit Type: Outpatient Age: 60 Room: [facility] OR ROOM 01 Gender: Male Note Status: Finalized Procedure:            Upper GI endoscopy Indications:          Dysphagia Providers:            [name], MD Referring MD:         Otelia Limes. Duran (Referring MD) Medicines:            Propofol per Anesthesia Complications:        No immediate complications. Procedure:            Pre-Anesthesia Assessment:                       - Prior to the procedure, a History and Physical was                        performed, and patient medications and allergies were                        reviewed. The patient's tolerance of previous                        anesthesia was also reviewed. The risks and benefits of                        the procedure and the sedation options and risks were                        discussed with the patient. All questions were                        answered, and informed consent was obtained. Prior                        Anticoagulants: The patient has taken no previous                        anticoagulant or antiplatelet agents. ASA Grade                        Assessment: II - A patient with mild systemic disease.                        After reviewing the risks and benefits, the patient was                        deemed in satisfactory condition to undergo the                        procedure.                       After obtaining informed consent, the endoscope was                        passed under direct vision. Throughout the procedure,  the patient's blood pressure, pulse, and oxygen                        saturations were monitored continuously. The Olympus                        GIF H180J colonscope [id number]) was introduced                        through the mouth,  and advanced to the second part of                        duodenum. The upper GI endoscopy was accomplished                        without difficulty. The patient tolerated the procedure                        well. Findings:      One mild benign-appearing, intrinsic stenosis was found in the upper       third of the esophagus. And was traversed.      Localized moderate inflammation characterized by erythema was found in       the gastric antrum. The PEG required removal because it was not       functioning. The PEG was removed under endoscopic vision. Removal was       accomplished without difficulty. Placement of an externally removable       PEG with no T-fasteners was successfully completed. The external bumper       was at the 2.0 cm marking on the tube.      The examined duodenum was normal. Impression:           - Benign-appearing esophageal stenosis.                       - Gastritis.                       - Normal examined duodenum.                       - The PEG was removed under endoscopic vision because                        it was not functioning.                       - An externally removable 22 fr. balloon PEG placement                        was successfully completed.                       - No specimens collected. Recommendation:       - Discharge patient to home. Procedure Code(s):    --- Professional ---                       [phone], Esophagogastroduodenoscopy, flexible, transoral;                        with directed placement of percutaneous gastrostomy tube Diagnosis Code(s):    ---  Professional ---                       R13.10, Dysphagia, unspecified                       K94.23, Gastrostomy malfunction                       K29.70, Gastritis, unspecified, without bleeding CPT copyright 2016 American Medical Association. All rights reserved. The codes documented in this report are preliminary and upon coder review may  be revised to meet current compliance  requirements. [name], MD 11/28/2015 7:52:41 AM This report has been signed electronically. Number of Addenda: 0 Note Initiated On: 11/28/2015 7:25 AM Total Procedure Duration: 0 hours 7 minutes 59 seconds       [facility]

## 2015-11-28 NOTE — H&P (Signed)
[facility]  [address], [place], [place] [address] Phone: [phone] Fax : [phone]  Primary Care Physician:  Isaias Cowman, PA-C Primary Gastroenterologist:  Dr. Allen Norris  Pre-Procedure History & Physical: HPI:  Mark [name] is a 60 y.o. male is here for an endoscopy.   Past Medical History  Diagnosis Date  . Cancer ([place])     tonsilal ca  . Aspiration pneumonia ([place])   . Tonsillar cancer, S/P surgery, chemotherapy XRT 2009-followed at [facility] 11/18/2014  . Peripheral neuropathy ([place]) 11/18/2014  . Memory deficit 11/18/2014  . PEG tube  11/18/2014  . Incontinence     night time, began recently  . Smokers' cough ([place])   . Arthritis     left hand  . Wears dentures     full upper and lower    Past Surgical History  Procedure Laterality Date  . Appendectomy    . Cholecystectomy    . Tonsillectomy      jan 2010--stage IV  . Peg tube placement  10/22/12    10/22/12 - [facility], 10/23/14 - [facility], Dr. Allen Norris, replaced    Prior to Admission medications   Medication Sig Start Date End Date Taking? Authorizing Provider  albuterol (PROVENTIL HFA;VENTOLIN HFA) 108 (90 BASE) MCG/ACT inhaler Inhale 2 puffs into the lungs every 6 (six) hours as needed for wheezing or shortness of breath.    Yes Historical Provider, MD  ARIPiprazole (ABILIFY) 2 MG tablet 2 mg by PEG Tube route daily.    Yes Historical Provider, MD  aspirin EC 81 MG tablet 81 mg by PEG Tube route daily. Reported on 09/13/2015   Yes Historical Provider, MD  budesonide-formoterol (SYMBICORT) 160-4.5 MCG/ACT inhaler Inhale 2 puffs into the lungs 2 (two) times daily.   Yes Historical Provider, MD  buPROPion (WELLBUTRIN SR) 150 MG 12 hr tablet 150 mg by PEG Tube route 2 (two) times daily.   Yes Historical Provider, MD  chlorproMAZINE (THORAZINE) 25 MG tablet Take 25 mg by mouth 4 (four) times daily. 09/21/15  Yes Historical Provider, MD  clonazePAM (KLONOPIN) 1 MG tablet 1 mg by PEG Tube route 3 (three) times daily as  needed for anxiety.    Yes Historical Provider, MD  cyclobenzaprine (FLEXERIL) 5 MG tablet Take 1 tablet (5 mg total) by mouth every 8 (eight) hours as needed for muscle spasms. 05/08/15  Yes Jenise V Bacon Menshew, PA-C  Ipratropium-Albuterol (COMBIVENT) 20-100 MCG/ACT AERS respimat Inhale 2 puffs into the lungs 2 (two) times daily.   Yes Historical Provider, MD  mometasone (NASONEX) 50 MCG/ACT nasal spray Place 2 sprays into both nostrils 2 (two) times daily.   Yes Historical Provider, MD  naproxen (EC NAPROSYN) 500 MG EC tablet Take 1 tablet (500 mg total) by mouth 2 (two) times daily with a meal. 05/08/15  Yes Jenise V Bacon Menshew, PA-C  Nutritional Supplements (FEEDING SUPPLEMENT, GLUCERNA 1.5 CAL,) LIQD Place 237 mLs into feeding tube daily at 3 pm. 04/25/15  Yes Fritzi Mandes, MD  Nutritional Supplements (GLUCERNA 1.5 CAL PO) 237 mLs by PEG Tube route See admin instructions. Pt uses up to seven times per day.   Yes Historical Provider, MD  Oxycodone HCl 20 MG TABS 20 mg by PEG Tube route every 4 (four) hours as needed (for pain).   Yes Historical Provider, MD  pantoprazole (PROTONIX) 40 MG tablet 40 mg by PEG Tube route at bedtime.    Yes Historical Provider, MD  pregabalin (LYRICA) 300 MG capsule Take 300  mg by mouth 2 (two) times daily.   Yes Historical Provider, MD  traZODone (DESYREL) 150 MG tablet 150 mg by PEG Tube route at bedtime.   Yes Historical Provider, MD  venlafaxine (EFFEXOR) 37.5 MG tablet 37.5 mg by PEG Tube route 2 (two) times daily.   Yes Historical Provider, MD  Water For Irrigation, Sterile (FREE WATER) SOLN Place 200 mLs into feeding tube every 8 (eight) hours. 04/25/15  Yes Fritzi Mandes, MD    Allergies as of 11/22/2015 - Review Complete 10/02/2015  Allergen Reaction Noted  . Morphine and related Nausea And Vomiting 11/18/2014    Family History  Problem Relation Age of Onset  . Liver cancer Mother   . Stroke Father   . Testicular cancer Father     Social History    Social History  . Marital Status: Married    Spouse Name: N/A  . Number of Children: N/A  . Years of Education: N/A   Occupational History  . Not on file.   Social History Main Topics  . Smoking status: Current Every Day Smoker -- 40 years  . Smokeless tobacco: Never Used     Comment: was 2 PPD, now 2 cigs/day  . Alcohol Use: No  . Drug Use: No  . Sexual Activity: Not on file   Other Topics Concern  . 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 Neck:  Supple; no masses or thyromegaly. Lungs:  Clear throughout to auscultation.    Heart:  Regular rate and rhythm. Abdomen:  Soft, nontender and nondistended. Normal bowel sounds, without guarding, and without rebound.   Neurologic:  Alert and  oriented x4;  grossly normal neurologically.  Impression/Plan: Mark [name] is here for an endoscopy to be performed for peg tube malfunction  Risks, benefits, limitations, and alternatives regarding  endoscopy have been reviewed with the patient.  Questions have been answered.  All parties agreeable.   Ollen Bowl, MD  11/28/2015, 7:19 AM

## 2015-11-29 ENCOUNTER — Encounter: Payer: Self-pay | Admitting: Gastroenterology

## 2015-12-19 DIAGNOSIS — C140 Malignant neoplasm of pharynx, unspecified: Secondary | ICD-10-CM | POA: Insufficient documentation

## 2015-12-19 DIAGNOSIS — J449 Chronic obstructive pulmonary disease, unspecified: Secondary | ICD-10-CM | POA: Insufficient documentation

## 2015-12-19 DIAGNOSIS — F17200 Nicotine dependence, unspecified, uncomplicated: Secondary | ICD-10-CM | POA: Insufficient documentation

## 2016-01-21 ENCOUNTER — Other Ambulatory Visit: Payer: Self-pay

## 2016-01-22 ENCOUNTER — Encounter: Payer: Self-pay | Admitting: *Deleted

## 2016-01-22 NOTE — Discharge Instructions (Signed)

## 2016-01-24 ENCOUNTER — Ambulatory Visit
Admission: RE | Admit: 2016-01-24 | Discharge: 2016-01-24 | Disposition: A | Discharge status: Home / Self Care | Payer: Medicaid Other | Source: Ambulatory Visit | Attending: Gastroenterology | Admitting: Gastroenterology

## 2016-01-24 ENCOUNTER — Encounter
Admission: RE | Disposition: A | Discharge status: Home / Self Care | Payer: Self-pay | Source: Ambulatory Visit | Attending: Gastroenterology

## 2016-01-24 ENCOUNTER — Ambulatory Visit: Payer: Medicaid Other | Admitting: Anesthesiology

## 2016-01-24 DIAGNOSIS — Z791 Long term (current) use of non-steroidal anti-inflammatories (NSAID): Secondary | ICD-10-CM | POA: Insufficient documentation

## 2016-01-24 DIAGNOSIS — Z885 Allergy status to narcotic agent status: Secondary | ICD-10-CM | POA: Insufficient documentation

## 2016-01-24 DIAGNOSIS — Z85818 Personal history of malignant neoplasm of other sites of lip, oral cavity, and pharynx: Secondary | ICD-10-CM | POA: Insufficient documentation

## 2016-01-24 DIAGNOSIS — K222 Esophageal obstruction: Secondary | ICD-10-CM | POA: Diagnosis not present

## 2016-01-24 DIAGNOSIS — Z800 Family history of malignant neoplasm of digestive organs: Secondary | ICD-10-CM | POA: Insufficient documentation

## 2016-01-24 DIAGNOSIS — Z7982 Long term (current) use of aspirin: Secondary | ICD-10-CM | POA: Insufficient documentation

## 2016-01-24 DIAGNOSIS — R1310 Dysphagia, unspecified: Secondary | ICD-10-CM

## 2016-01-24 DIAGNOSIS — F17210 Nicotine dependence, cigarettes, uncomplicated: Secondary | ICD-10-CM | POA: Diagnosis not present

## 2016-01-24 DIAGNOSIS — Z9889 Other specified postprocedural states: Secondary | ICD-10-CM | POA: Diagnosis not present

## 2016-01-24 DIAGNOSIS — M19042 Primary osteoarthritis, left hand: Secondary | ICD-10-CM | POA: Insufficient documentation

## 2016-01-24 DIAGNOSIS — Z79899 Other long term (current) drug therapy: Secondary | ICD-10-CM | POA: Insufficient documentation

## 2016-01-24 DIAGNOSIS — G629 Polyneuropathy, unspecified: Secondary | ICD-10-CM | POA: Insufficient documentation

## 2016-01-24 DIAGNOSIS — K2950 Unspecified chronic gastritis without bleeding: Secondary | ICD-10-CM | POA: Diagnosis not present

## 2016-01-24 DIAGNOSIS — Z8043 Family history of malignant neoplasm of testis: Secondary | ICD-10-CM | POA: Diagnosis not present

## 2016-01-24 DIAGNOSIS — Z931 Gastrostomy status: Secondary | ICD-10-CM | POA: Diagnosis not present

## 2016-01-24 DIAGNOSIS — K2970 Gastritis, unspecified, without bleeding: Secondary | ICD-10-CM

## 2016-01-24 DIAGNOSIS — Z823 Family history of stroke: Secondary | ICD-10-CM | POA: Insufficient documentation

## 2016-01-24 DIAGNOSIS — R1319 Other dysphagia: Secondary | ICD-10-CM | POA: Insufficient documentation

## 2016-01-24 DIAGNOSIS — Z9049 Acquired absence of other specified parts of digestive tract: Secondary | ICD-10-CM | POA: Insufficient documentation

## 2016-01-24 HISTORY — PX: ESOPHAGOGASTRODUODENOSCOPY (EGD) WITH PROPOFOL: SHX5813

## 2016-01-24 SURGERY — ESOPHAGOGASTRODUODENOSCOPY (EGD) WITH PROPOFOL
Anesthesia: Monitor Anesthesia Care | Wound class: Clean Contaminated

## 2016-01-24 MED ORDER — GLYCOPYRROLATE 0.2 MG/ML IJ SOLN
INTRAMUSCULAR | Status: DC | PRN
  Administered 2016-01-24: 0.1 mg via INTRAVENOUS

## 2016-01-24 MED ORDER — PROPOFOL 10 MG/ML IV BOLUS
INTRAVENOUS | Status: DC | PRN
Start: 2016-01-24 — End: 2016-01-24
  Administered 2016-01-24: 100 mg via INTRAVENOUS

## 2016-01-24 MED ORDER — LIDOCAINE HCL (CARDIAC) 20 MG/ML IV SOLN
INTRAVENOUS | Status: DC | PRN
  Administered 2016-01-24: 50 mg via INTRAVENOUS

## 2016-01-24 MED ORDER — LACTATED RINGERS IV SOLN
INTRAVENOUS | Status: DC
  Administered 2016-01-24: 09:00:00 via INTRAVENOUS

## 2016-01-24 MED ORDER — EPHEDRINE SULFATE 50 MG/ML IJ SOLN
INTRAMUSCULAR | Status: DC | PRN
  Administered 2016-01-24: 5 mg via INTRAVENOUS

## 2016-01-24 SURGICAL SUPPLY — 31 items
BALLN DILATOR 10-12 8 (BALLOONS)
BALLN DILATOR 12-15 8 (BALLOONS) ×3
BALLN DILATOR 15-18 8 (BALLOONS)
BALLN DILATOR CRE 0-12 8 (BALLOONS)
BALLN DILATOR ESOPH 8 10 CRE (MISCELLANEOUS) IMPLANT
BALLOON DILATOR 12-15 8 (BALLOONS) IMPLANT
BALLOON DILATOR 15-18 8 (BALLOONS) IMPLANT
BALLOON DILATOR CRE 0-12 8 (BALLOONS) IMPLANT
BLOCK BITE 60FR ADLT L/F GRN (MISCELLANEOUS) ×3 IMPLANT
CANISTER SUCT 1200ML W/VALVE (MISCELLANEOUS) ×3 IMPLANT
CLIP HMST 235XBRD CATH ROT (MISCELLANEOUS) IMPLANT
CLIP RESOLUTION 360 11X235 (MISCELLANEOUS)
FCP ESCP3.2XJMB 240X2.8X (MISCELLANEOUS)
FORCEPS BIOP RAD 4 LRG CAP 4 (CUTTING FORCEPS) IMPLANT
FORCEPS BIOP RJ4 240 W/NDL (MISCELLANEOUS)
FORCEPS ESCP3.2XJMB 240X2.8X (MISCELLANEOUS) IMPLANT
GOWN CVR UNV OPN BCK APRN NK (MISCELLANEOUS) ×2 IMPLANT
GOWN ISOL THUMB LOOP REG UNIV (MISCELLANEOUS) ×6
INJECTOR VARIJECT VIN23 (MISCELLANEOUS) IMPLANT
KIT DEFENDO VALVE AND CONN (KITS) IMPLANT
KIT ENDO PROCEDURE OLY (KITS) ×3 IMPLANT
MARKER SPOT ENDO TATTOO 5ML (MISCELLANEOUS) IMPLANT
PAD GROUND ADULT SPLIT (MISCELLANEOUS) IMPLANT
SNARE SHORT THROW 13M SML OVAL (MISCELLANEOUS) IMPLANT
SNARE SHORT THROW 30M LRG OVAL (MISCELLANEOUS) IMPLANT
SPOT EX ENDOSCOPIC TATTOO (MISCELLANEOUS)
SYR INFLATION 60ML (SYRINGE) ×2 IMPLANT
TRAP ETRAP POLY (MISCELLANEOUS) IMPLANT
VARIJECT INJECTOR VIN23 (MISCELLANEOUS)
WATER STERILE IRR 250ML POUR (IV SOLUTION) ×3 IMPLANT
WIRE CRE 18-20MM 8CM F G (MISCELLANEOUS) IMPLANT

## 2016-01-24 NOTE — Op Note (Signed)
[facility]astroenterology Patient Name: Mark [name] Procedure Date: 01/24/2016 10:29 AM MRN: [id number] Account #: [id number] Date of Birth: [birth year] Admit Type: Outpatient Age: 60 Room: [facility] OR ROOM 01 Gender: Male Note Status: Finalized Procedure:            Upper GI endoscopy Indications:          Dysphagia Providers:            [name], MD Referring MD:         Otelia Limes. Duran (Referring MD) Medicines:            Propofol per Anesthesia Complications:        No immediate complications. Procedure:            Pre-Anesthesia Assessment:                       - Prior to the procedure, a History and Physical was                        performed, and patient medications and allergies were                        reviewed. The patient's tolerance of previous                        anesthesia was also reviewed. The risks and benefits of                        the procedure and the sedation options and risks were                        discussed with the patient. All questions were                        answered, and informed consent was obtained. Prior                        Anticoagulants: The patient has taken no previous                        anticoagulant or antiplatelet agents. ASA Grade                        Assessment: II - A patient with mild systemic disease.                        After reviewing the risks and benefits, the patient was                        deemed in satisfactory condition to undergo the                        procedure.                       After obtaining informed consent, the endoscope was                        passed under direct vision. Throughout the procedure,  the patient's blood pressure, pulse, and oxygen                        saturations were monitored continuously. The Olympus                        GIF H180J endoscope (S#: [id number]) was introduced                        through the mouth,  and advanced to the second part of                        duodenum. The upper GI endoscopy was accomplished                        without difficulty. The patient tolerated the procedure                        well. Findings:      One severe benign-appearing, intrinsic stenosis was found in the upper       third of the esophagus. And was traversed. A TTS dilator was passed       through the scope. Dilation with a 12-13.5-15 mm balloon dilator was       performed to 13.5 mm. The dilation site was examined following endoscope       reinsertion and showed moderate improvement in luminal narrowing.      Localized moderate inflammation characterized by erythema was found in       the gastric antrum. Biopsies were taken with a cold forceps for       histology.      There was evidence of a gastrostomy present in the gastric body.      The examined duodenum was normal. Impression:           - Benign-appearing esophageal stenosis. Dilated.                       - Gastritis. Biopsied.                       - Gastrostomy present.                       - Normal examined duodenum. Recommendation:       - Await pathology results.                       - Repeat upper endoscopy PRN for retreatment. Procedure Code(s):    --- Professional ---                       [phone], Esophagogastroduodenoscopy, flexible, transoral;                        with transendoscopic balloon dilation of esophagus                        (less than 30 mm diameter)                       [id number], Esophagogastroduodenoscopy, flexible, transoral;  with biopsy, single or multiple Diagnosis Code(s):    --- Professional ---                       R13.10, Dysphagia, unspecified                       K29.70, Gastritis, unspecified, without bleeding                       Z93.1, Gastrostomy status                       K22.2, Esophageal obstruction CPT copyright 2016 American Medical Association. All rights  reserved. The codes documented in this report are preliminary and upon coder review may  be revised to meet current compliance requirements. [name], MD 01/24/2016 10:46:14 AM This report has been signed electronically. Number of Addenda: 0 Note Initiated On: 01/24/2016 10:29 AM      [facility]

## 2016-01-24 NOTE — H&P (Signed)
[name], MD [facility] [address] [place], [place] [address] Phone: [phone] Fax : [phone]  Primary Care Physician:  Isaias Cowman, PA-C Primary Gastroenterologist:  Dr. Allen Norris  Pre-Procedure History & Physical: HPI:  Mark [name] is a 60 y.o. male is here for an endoscopy.   Past Medical History  Diagnosis Date  . Cancer ([place])     tonsilal ca  . Aspiration pneumonia ([place])   . Tonsillar cancer, S/P surgery, chemotherapy XRT 2009-followed at [facility] 11/18/2014  . Peripheral neuropathy ([place]) 11/18/2014  . Memory deficit 11/18/2014  . PEG tube  11/18/2014  . Incontinence     night time, began recently  . Smokers' cough ([place])   . Arthritis     left hand  . Wears dentures     full upper and lower    Past Surgical History  Procedure Laterality Date  . Appendectomy    . Cholecystectomy    . Tonsillectomy      jan 2010--stage IV  . Peg tube placement  10/22/12    10/22/12 - [facility], 10/23/14 - [facility], Dr. Allen Norris, replaced  . Peg placement N/A 11/28/2015    Procedure: PERCUTANEOUS ENDOSCOPIC GASTROSTOMY (PEG) PLACEMENT;  Surgeon: [name], MD;  Location: [facility];  Service: Endoscopy;  Laterality: N/A;    Prior to Admission medications   Medication Sig Start Date End Date Taking? Authorizing Provider  albuterol (PROVENTIL HFA;VENTOLIN HFA) 108 (90 BASE) MCG/ACT inhaler Inhale 2 puffs into the lungs every 6 (six) hours as needed for wheezing or shortness of breath.    Yes Historical Provider, MD  ARIPiprazole (ABILIFY) 2 MG tablet 2 mg by PEG Tube route daily.    Yes Historical Provider, MD  aspirin EC 81 MG tablet 81 mg by PEG Tube route daily. Reported on 09/13/2015   Yes Historical Provider, MD  budesonide-formoterol (SYMBICORT) 160-4.5 MCG/ACT inhaler Inhale 2 puffs into the lungs 2 (two) times daily.   Yes Historical Provider, MD  buPROPion (WELLBUTRIN SR) 150 MG 12 hr tablet 150 mg by PEG Tube route 2 (two) times daily.   Yes Historical Provider, MD    chlorproMAZINE (THORAZINE) 25 MG tablet Take 25 mg by mouth 4 (four) times daily. 09/21/15  Yes Historical Provider, MD  clonazePAM (KLONOPIN) 1 MG tablet 1 mg by PEG Tube route 3 (three) times daily as needed for anxiety.    Yes Historical Provider, MD  Ipratropium-Albuterol (COMBIVENT) 20-100 MCG/ACT AERS respimat Inhale 2 puffs into the lungs 2 (two) times daily.   Yes Historical Provider, MD  mometasone (NASONEX) 50 MCG/ACT nasal spray Place 2 sprays into both nostrils 2 (two) times daily.   Yes Historical Provider, MD  naproxen (EC NAPROSYN) 500 MG EC tablet Take 1 tablet (500 mg total) by mouth 2 (two) times daily with a meal. 05/08/15  Yes Jenise V Bacon Menshew, PA-C  Nutritional Supplements (FEEDING SUPPLEMENT, GLUCERNA 1.5 CAL,) LIQD Place 237 mLs into feeding tube daily at 3 pm. 04/25/15  Yes Fritzi Mandes, MD  Oxycodone HCl 20 MG TABS 20 mg by PEG Tube route every 4 (four) hours as needed (for pain).   Yes Historical Provider, MD  pantoprazole (PROTONIX) 40 MG tablet 40 mg by PEG Tube route at bedtime.    Yes Historical Provider, MD  pregabalin (LYRICA) 300 MG capsule Take 300 mg by mouth 2 (two) times daily.   Yes Historical Provider, MD  traZODone (DESYREL) 150 MG tablet 150 mg by PEG Tube route at bedtime.  Yes Historical Provider, MD  venlafaxine (EFFEXOR) 37.5 MG tablet 37.5 mg by PEG Tube route 2 (two) times daily.   Yes Historical Provider, MD  Water For Irrigation, Sterile (FREE WATER) SOLN Place 200 mLs into feeding tube every 8 (eight) hours. 04/25/15  Yes Fritzi Mandes, MD  cyclobenzaprine (FLEXERIL) 5 MG tablet Take 1 tablet (5 mg total) by mouth every 8 (eight) hours as needed for muscle spasms. Patient not taking: Reported on 01/24/2016 05/08/15   Dannielle Karvonen Menshew, PA-C  Nutritional Supplements (GLUCERNA 1.5 CAL PO) 237 mLs by PEG Tube route See admin instructions. Pt uses up to seven times per day.    Historical Provider, MD    Allergies as of 01/21/2016 - Review Complete  11/28/2015  Allergen Reaction Noted  . Morphine and related Nausea And Vomiting 11/18/2014    Family History  Problem Relation Age of Onset  . Liver cancer Mother   . Stroke Father   . Testicular cancer Father     Social History   Social History  . Marital Status: Married    Spouse Name: N/A  . Number of Children: N/A  . Years of Education: N/A   Occupational History  . Not on file.   Social History Main Topics  . Smoking status: Current Every Day Smoker -- 40 years  . Smokeless tobacco: Never Used     Comment: was 2 PPD, now 2 cigs/day  . Alcohol Use: No  . Drug Use: No  . Sexual Activity: Not on file   Other Topics Concern  . 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 Neck:  Supple; no masses or thyromegaly. Lungs:  Clear throughout to auscultation.    Heart:  Regular rate and rhythm. Abdomen:  Soft, nontender and nondistended. Normal bowel sounds, without guarding, and without rebound.   Neurologic:  Alert and  oriented x4;  grossly normal neurologically.  Impression/Plan: KEYAN WEAN is here for an endoscopy to be performed for dysphagia  Risks, benefits, limitations, and alternatives regarding  endoscopy have been reviewed with the patient.  Questions have been answered.  All parties agreeable.   [name], MD  01/24/2016, 10:31 AM

## 2016-01-24 NOTE — Anesthesia Preprocedure Evaluation (Signed)
Anesthesia Evaluation  Patient identified by MRN, date of birth, ID band  Reviewed: Allergy & Precautions, H&P , NPO status , Patient's Chart, lab work & pertinent test results  History of Anesthesia Complications (+) DIFFICULT AIRWAY  Airway Mallampati: II  TM Distance: >3 FB Neck ROM: full    Dental no notable dental hx. (+) Upper Dentures, Lower Dentures   Pulmonary pneumonia, Current Smoker,    Pulmonary exam normal        Cardiovascular  Rhythm:regular Rate:Normal     Neuro/Psych    GI/Hepatic   Endo/Other    Renal/GU      Musculoskeletal   Abdominal   Peds  Hematology   Anesthesia Other Findings Hx of oral cancer, s/p surgery and chemo  Reproductive/Obstetrics                             Anesthesia Physical Anesthesia Plan  ASA: III  Anesthesia Plan: MAC   Post-op Pain Management:    Induction: Intravenous  Airway Management Planned:   Additional Equipment:   Intra-op Plan:   Post-operative Plan:   Informed Consent: I have reviewed the patients History and Physical, chart, labs and discussed the procedure including the risks, benefits and alternatives for the proposed anesthesia with the patient or authorized representative who has indicated his/her understanding and acceptance.     Plan Discussed with: CRNA  Anesthesia Plan Comments:         Anesthesia Quick Evaluation

## 2016-01-24 NOTE — Anesthesia Procedure Notes (Signed)
Procedure Name: MAC Date/Time: 01/24/2016 10:35 AM Performed by: Cameron Ali Pre-anesthesia Checklist: Patient identified, Emergency Drugs available, Suction available, Timeout performed and Patient being monitored Patient Re-evaluated:Patient Re-evaluated prior to inductionOxygen Delivery Method: Nasal cannula Placement Confirmation: positive ETCO2

## 2016-01-24 NOTE — Transfer of Care (Signed)
Immediate Anesthesia Transfer of Care Note  Patient: Mark [name]  Procedure(s) Performed: Procedure(s): ESOPHAGOGASTRODUODENOSCOPY (EGD) WITH gastric biopsy and esophageal dilation. (N/A)  Patient Location: PACU  Anesthesia Type: MAC  Level of Consciousness: awake, alert  and patient cooperative  Airway and Oxygen Therapy: Patient Spontanous Breathing and Patient connected to supplemental oxygen  Post-op Assessment: Post-op Vital signs reviewed, Patient's Cardiovascular Status Stable, Respiratory Function Stable, Patent Airway and No signs of Nausea or vomiting  Post-op Vital Signs: Reviewed and stable  Complications: No apparent anesthesia complications

## 2016-01-24 NOTE — Anesthesia Postprocedure Evaluation (Signed)
Anesthesia Post Note  Patient: Mark [name]  Procedure(s) Performed: Procedure(s) (LRB): ESOPHAGOGASTRODUODENOSCOPY (EGD) WITH gastric biopsy and esophageal dilation. (N/A)  Patient location during evaluation: PACU Anesthesia Type: General Level of consciousness: awake and alert Pain management: pain level controlled Vital Signs Assessment: post-procedure vital signs reviewed and stable Respiratory status: spontaneous breathing, nonlabored ventilation, respiratory function stable and patient connected to nasal cannula oxygen Cardiovascular status: blood pressure returned to baseline and stable Postop Assessment: no signs of nausea or vomiting Anesthetic complications: no    Marshell Levan

## 2016-01-25 ENCOUNTER — Encounter: Payer: Self-pay | Admitting: Gastroenterology

## 2016-01-29 ENCOUNTER — Encounter: Payer: Self-pay | Admitting: Gastroenterology

## 2016-02-15 ENCOUNTER — Emergency Department
Admission: EM | Admit: 2016-02-15 | Discharge: 2016-02-15 | Disposition: A | Discharge status: Home / Self Care | Payer: Medicaid Other | Attending: Emergency Medicine | Admitting: Emergency Medicine

## 2016-02-15 ENCOUNTER — Emergency Department: Payer: Medicaid Other

## 2016-02-15 ENCOUNTER — Encounter: Payer: Self-pay | Admitting: Emergency Medicine

## 2016-02-15 DIAGNOSIS — F17200 Nicotine dependence, unspecified, uncomplicated: Secondary | ICD-10-CM | POA: Insufficient documentation

## 2016-02-15 DIAGNOSIS — Z79899 Other long term (current) drug therapy: Secondary | ICD-10-CM | POA: Insufficient documentation

## 2016-02-15 DIAGNOSIS — Z7951 Long term (current) use of inhaled steroids: Secondary | ICD-10-CM | POA: Diagnosis not present

## 2016-02-15 DIAGNOSIS — J441 Chronic obstructive pulmonary disease with (acute) exacerbation: Secondary | ICD-10-CM | POA: Diagnosis not present

## 2016-02-15 DIAGNOSIS — M1990 Unspecified osteoarthritis, unspecified site: Secondary | ICD-10-CM | POA: Insufficient documentation

## 2016-02-15 DIAGNOSIS — R0602 Shortness of breath: Secondary | ICD-10-CM | POA: Diagnosis present

## 2016-02-15 HISTORY — DX: Chronic obstructive pulmonary disease, unspecified: J44.9

## 2016-02-15 LAB — BASIC METABOLIC PANEL
Anion gap: 10 (ref 5–15)
BUN: 16 mg/dL (ref 6–20)
CALCIUM: 9 mg/dL (ref 8.9–10.3)
CO2: 31 mmol/L (ref 22–32)
CREATININE: 0.82 mg/dL (ref 0.61–1.24)
Chloride: 94 mmol/L — ABNORMAL LOW (ref 101–111)
GFR calc Af Amer: >60 mL/min (ref >60)
GFR calc non Af Amer: >60 mL/min (ref >60)
GLUCOSE: 214 mg/dL — AB (ref 65–99)
Potassium: 3.8 mmol/L (ref 3.5–5.1)
Sodium: 135 mmol/L (ref 135–145)

## 2016-02-15 LAB — CBC
HCT: 36.8 % — ABNORMAL LOW (ref 40.0–52.0)
Hemoglobin: 12.2 g/dL — ABNORMAL LOW (ref 13.0–18.0)
MCH: 28.6 pg (ref 26.0–34.0)
MCHC: 33.2 g/dL (ref 32.0–36.0)
MCV: 86.2 fL (ref 80.0–100.0)
PLATELETS: 190 10*3/uL (ref 150–440)
RBC: 4.27 MIL/uL — ABNORMAL LOW (ref 4.40–5.90)
RDW: 15.5 % — ABNORMAL HIGH (ref 11.5–14.5)
WBC: 12.1 10*3/uL — ABNORMAL HIGH (ref 3.8–10.6)

## 2016-02-15 LAB — TROPONIN I

## 2016-02-15 MED ORDER — PREDNISONE 50 MG PO TABS: 50.0000 mg | ORAL_TABLET | Freq: Every day | ORAL | Status: DC

## 2016-02-15 MED ORDER — IPRATROPIUM-ALBUTEROL 0.5-2.5 (3) MG/3ML IN SOLN
RESPIRATORY_TRACT | Status: AC
  Administered 2016-02-15: 3 mL via RESPIRATORY_TRACT
  Filled 2016-02-15: qty 6

## 2016-02-15 MED ORDER — IPRATROPIUM-ALBUTEROL 0.5-2.5 (3) MG/3ML IN SOLN
3.0000 mL | Freq: Four times a day (QID) | RESPIRATORY_TRACT | Status: DC
  Administered 2016-02-15: 3 mL via RESPIRATORY_TRACT

## 2016-02-15 MED ORDER — METHYLPREDNISOLONE SODIUM SUCC 125 MG IJ SOLR
INTRAMUSCULAR | Status: AC
Start: 2016-02-15 — End: 2016-02-15
  Administered 2016-02-15: 125 mg via INTRAVENOUS
  Filled 2016-02-15: qty 2

## 2016-02-15 MED ORDER — AZITHROMYCIN 250 MG PO TABS: ORAL_TABLET | ORAL | Status: AC

## 2016-02-15 MED ORDER — IPRATROPIUM-ALBUTEROL 0.5-2.5 (3) MG/3ML IN SOLN
3.0000 mL | Freq: Once | RESPIRATORY_TRACT | Status: AC
  Administered 2016-02-15: 3 mL via RESPIRATORY_TRACT

## 2016-02-15 MED ORDER — ALBUTEROL SULFATE (2.5 MG/3ML) 0.083% IN NEBU
5.0000 mg | INHALATION_SOLUTION | Freq: Once | RESPIRATORY_TRACT | Status: AC
  Administered 2016-02-15: 5 mg via RESPIRATORY_TRACT
  Filled 2016-02-15: qty 6

## 2016-02-15 MED ORDER — METHYLPREDNISOLONE SODIUM SUCC 125 MG IJ SOLR
125.0000 mg | Freq: Once | INTRAMUSCULAR | Status: AC
  Administered 2016-02-15: 125 mg via INTRAVENOUS

## 2016-02-15 NOTE — ED Notes (Signed)
Patient reports that he has been having productive cough, congestion, and runny nose. Patient feels more short of breath than normal. Patient has had sick contacts at home. Recently had endoscopy and his esophagus was stretched.

## 2016-02-15 NOTE — ED Provider Notes (Signed)
[facility] Emergency Department Provider Note  ____________________________________________    I have reviewed the triage vital signs and the nursing notes.   HISTORY  Chief Complaint Shortness of Breath    HPI Mark [name] is a 60 y.o. male who presents with shortness of breath. Patient reports a history of COPD, he is on 2.5 L of oxygen at home. He continues to smoke 3 cigarettes a day. He also has a history of stricture and stenosis of esophagus, he had a dilatation on June 8 which went well. He does have a G-tube but he is able to "nibble on things". He is concerned that he may have developed an aspiration pneumonia or worsening of his COPD because his family has a viral illness.     Past Medical History  Diagnosis Date  . Cancer ([place])     tonsilal ca  . Aspiration pneumonia ([place])   . Tonsillar cancer, S/P surgery, chemotherapy XRT 2009-followed at [facility] 11/18/2014  . Peripheral neuropathy ([place]) 11/18/2014  . Memory deficit 11/18/2014  . PEG tube  11/18/2014  . Incontinence     night time, began recently  . Smokers' cough ([place])   . Arthritis     left hand  . Wears dentures     full upper and lower  . COPD (chronic obstructive pulmonary disease) [facility])     Patient Active Problem List   Diagnosis Date Noted  . Stricture and stenosis of esophagus   . 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 Problems with swallowing and mastication   . Mechanical complication of gastrostomy ([place])   . Gastritis   . Excessive urination at night 05/30/2015  . Encounter for screening for malignant neoplasm of prostate 05/30/2015  . FOM (frequency of micturition) 05/30/2015  . Sepsis ([place]) 04/23/2015  . Aspiration pneumonia ([place]) 11/18/2014  . Tonsillar cancer, S/P surgery, chemotherapy XRT 2009-followed at [facility] 11/18/2014  . Peripheral neuropathy ([place]) 11/18/2014  . Adult failure to thrive 11/18/2014  . Recurrent aspiration pneumonia ([place]) 11/18/2014  . Memory deficit  11/18/2014  . PEG tube  11/18/2014    Past Surgical History  Procedure Laterality Date  . Appendectomy    . Cholecystectomy    . Tonsillectomy      jan 2010--stage IV  . Peg tube placement  10/22/12    10/22/12 - [facility], 10/23/14 - [facility], Dr. Allen Norris, replaced  . Peg placement N/A 11/28/2015    Procedure: PERCUTANEOUS ENDOSCOPIC GASTROSTOMY (PEG) PLACEMENT;  Surgeon: [name], MD;  Location: [place];  Service: Endoscopy;  Laterality: N/A;  . Esophagogastroduodenoscopy (egd) with propofol N/A 01/24/2016    Procedure: ESOPHAGOGASTRODUODENOSCOPY (EGD) WITH gastric biopsy and esophageal dilation.;  Surgeon: [name], MD;  Location: [place];  Service: Endoscopy;  Laterality: N/A;    Current Outpatient Rx  Name  Route  Sig  Dispense  Refill  . albuterol (PROVENTIL HFA;VENTOLIN HFA) 108 (90 BASE) MCG/ACT inhaler   Inhalation   Inhale 2 puffs into the lungs every 6 (six) hours as needed for wheezing or shortness of breath.          . ARIPiprazole (ABILIFY) 2 MG tablet   PEG Tube   2 mg by PEG Tube route daily.          Marland Kitchen aspirin EC 81 MG tablet   PEG Tube   81 mg by PEG Tube route daily. Reported on 09/13/2015         . budesonide-formoterol (SYMBICORT) 160-4.5 MCG/ACT inhaler  Inhalation   Inhale 2 puffs into the lungs 2 (two) times daily.         Marland Kitchen buPROPion (WELLBUTRIN SR) 150 MG 12 hr tablet   PEG Tube   150 mg by PEG Tube route 2 (two) times daily.         . chlorproMAZINE (THORAZINE) 25 MG tablet   Oral   Take 25 mg by mouth 4 (four) times daily.      0   . clonazePAM (KLONOPIN) 1 MG tablet   PEG Tube   1 mg by PEG Tube route 3 (three) times daily as needed for anxiety.          . cyclobenzaprine (FLEXERIL) 5 MG tablet   Oral   Take 1 tablet (5 mg total) by mouth every 8 (eight) hours as needed for muscle spasms. Patient not taking: Reported on 01/24/2016   12 tablet   0   . Ipratropium-Albuterol (COMBIVENT) 20-100 MCG/ACT AERS  respimat   Inhalation   Inhale 2 puffs into the lungs 2 (two) times daily.         . mometasone (NASONEX) 50 MCG/ACT nasal spray   Each Nare   Place 2 sprays into both nostrils 2 (two) times daily.         . naproxen (EC NAPROSYN) 500 MG EC tablet   Oral   Take 1 tablet (500 mg total) by mouth 2 (two) times daily with a meal.   30 tablet   0   . Nutritional Supplements (FEEDING SUPPLEMENT, GLUCERNA 1.5 CAL,) LIQD   Per Tube   Place 237 mLs into feeding tube daily at 3 pm.   30 Can   0   . Nutritional Supplements (GLUCERNA 1.5 CAL PO)   PEG Tube   237 mLs by PEG Tube route See admin instructions. Pt uses up to seven times per day.         . Oxycodone HCl 20 MG TABS   PEG Tube   20 mg by PEG Tube route every 4 (four) hours as needed (for pain).         . pantoprazole (PROTONIX) 40 MG tablet   PEG Tube   40 mg by PEG Tube route at bedtime.          . pregabalin (LYRICA) 300 MG capsule   Oral   Take 300 mg by mouth 2 (two) times daily.         . traZODone (DESYREL) 150 MG tablet   PEG Tube   150 mg by PEG Tube route at bedtime.         Marland Kitchen venlafaxine (EFFEXOR) 37.5 MG tablet   PEG Tube   37.5 mg by PEG Tube route 2 (two) times daily.         . Water For Irrigation, Sterile (FREE WATER) SOLN   Per Tube   Place 200 mLs into feeding tube every 8 (eight) hours.   1000 mL   0     Allergies Morphine and related  Family History  Problem Relation Age of Onset  . Liver cancer Mother   . Stroke Father   . Testicular cancer Father     Social History Social History  Substance Use Topics  . Smoking status: Current Every Day Smoker -- 40 years  . Smokeless tobacco: Never Used     Comment: was 2 PPD, now 2 cigs/day  . Alcohol Use: No    Review of Systems  Constitutional: Negative for fever.  Eyes: Negative forBlurred vision ENT: Negative for sore throat Cardiovascular: Negative for chest pain Respiratory: Shortness of breath,  cough Gastrointestinal: Negative for abdominal pain Genitourinary: Negative for dysuria. Musculoskeletal: Negative for back pain. Skin: Negative for rash. Neurological: Negative for headache Psychiatric: no anxiety    ____________________________________________   PHYSICAL EXAM:  VITAL SIGNS: ED Triage Vitals  Enc Vitals Group     BP 02/15/16 0837 118/72 mmHg     Pulse Rate 02/15/16 0837 109     Resp 02/15/16 0837 24     Temp 02/15/16 0837 98.2 F (36.8 C)     Temp Source 02/15/16 0837 Oral     SpO2 02/15/16 0837 87 %     Weight 02/15/16 0837 162 lb (73.483 kg)     Height 02/15/16 0837 6' (1.829 m)     Head Cir --      Peak Flow --      Pain Score 02/15/16 0837 0     Pain Loc --      Pain Edu? --      Excl. in [place]? --      Constitutional: Alert and oriented. Well appearing and in no distress.  Eyes: Conjunctivae are normal. No erythema or injection ENT   Head: Normocephalic and atraumatic.   Mouth/Throat: Mucous membranes are moist. Cardiovascular: Tachycardia, regular rhythm. Normal and symmetric distal pulses are present in the upper extremities. Respiratory: Mildly increased respiratory effort without tachypnea. Wheezing bilaterally, scattered rales Gastrointestinal: Soft and non-tender in all quadrants. No distention. . G-tube noted Genitourinary: deferred Musculoskeletal: Nontender with normal range of motion in all extremities. No lower extremity tenderness nor edema. Neurologic:  Normal speech and language. No gross focal neurologic deficits are appreciated. Skin:  Skin is warm, dry and intact. No rash noted. Psychiatric: Mood and affect are normal. Patient exhibits appropriate insight and judgment.  ____________________________________________    LABS (pertinent positives/negatives)  Labs Reviewed  BASIC METABOLIC PANEL - Abnormal; Notable for the following:    Chloride 94 (*)    Glucose, Bld 214 (*)    All other components within normal limits   CBC - Abnormal; Notable for the following:    WBC 12.1 (*)    RBC 4.27 (*)    Hemoglobin 12.2 (*)    HCT 36.8 (*)    RDW 15.5 (*)    All other components within normal limits  TROPONIN I    ____________________________________________   EKG  ED ECG REPORT I, Lavonia Drafts, the attending physician, personally viewed and interpreted this ECG.  Date: 02/15/2016 EKG Time: 8:53 AM Rate: 78 Rhythm: normal sinus rhythm QRS Axis: normal Intervals: normal ST/T Wave abnormalities: normal Conduction Disturbances: none Narrative Interpretation: unremarkable   ____________________________________________    RADIOLOGY  No acute distress on chest x-ray  ____________________________________________   PROCEDURES  Procedure(s) performed: none  Critical Care performed: none  ____________________________________________   INITIAL IMPRESSION / ASSESSMENT AND PLAN / ED COURSE  Pertinent labs & imaging results that were available during my care of the patient were reviewed by me and considered in my medical decision making (see chart for details).  Patient presents with shortness of breath. I suspect COPD exacerbation given exam. We'll obtain x-rays, labs, treat with Solu-Medrol and nebulizers and reevaluate.  ----------------------------------------- 10:52 AM on 02/15/2016 -----------------------------------------  Patient reports "I feel great", he is anxious to go home. His heart rate is 88 and oxygen saturations are 97% on 2.5 L which is what he is typically on. I discussed with  him the possibility of admission given his initial presentation but he says he feels somewhat better that he is coughing go home and he will return if any worsening of his condition. He will use his inhaler every 4 hours and rest at home. I feel this is reasonable given his current clinical condition but I emphasized return precautions at length. His wife is here and also agrees with  discharge  ____________________________________________   FINAL CLINICAL IMPRESSION(S) / ED DIAGNOSES  Final diagnoses:  COPD exacerbation ([place])          Lavonia Drafts, MD 02/15/16 1054

## 2016-02-15 NOTE — Discharge Instructions (Signed)

## 2016-02-15 NOTE — ED Notes (Addendum)
Patient presents to the ED with shortness of breath x 2 days.  Patient states he has had cold/virus symptoms x 1 week but the sob got really bad last night with difficulty speaking in full sentences.  Patient has history of COPD.  Patient also states he recently started eating again after having his esophagus stretched. Patient states, sometimes I get pneumonia after I eat.  Patient's voice is hoarse.

## 2016-03-26 DIAGNOSIS — J479 Bronchiectasis, uncomplicated: Secondary | ICD-10-CM | POA: Insufficient documentation

## 2016-04-21 ENCOUNTER — Emergency Department: Payer: No Typology Code available for payment source

## 2016-04-21 ENCOUNTER — Emergency Department
Admission: EM | Admit: 2016-04-21 | Discharge: 2016-04-21 | Disposition: A | Discharge status: Home / Self Care | Payer: No Typology Code available for payment source | Attending: Emergency Medicine | Admitting: Emergency Medicine

## 2016-04-21 ENCOUNTER — Encounter: Payer: Self-pay | Admitting: Emergency Medicine

## 2016-04-21 DIAGNOSIS — Y92410 Unspecified street and highway as the place of occurrence of the external cause: Secondary | ICD-10-CM | POA: Insufficient documentation

## 2016-04-21 DIAGNOSIS — Z8589 Personal history of malignant neoplasm of other organs and systems: Secondary | ICD-10-CM | POA: Insufficient documentation

## 2016-04-21 DIAGNOSIS — M7918 Myalgia, other site: Secondary | ICD-10-CM

## 2016-04-21 DIAGNOSIS — S161XXA Strain of muscle, fascia and tendon at neck level, initial encounter: Secondary | ICD-10-CM | POA: Diagnosis not present

## 2016-04-21 DIAGNOSIS — M791 Myalgia: Secondary | ICD-10-CM | POA: Diagnosis not present

## 2016-04-21 DIAGNOSIS — Y9389 Activity, other specified: Secondary | ICD-10-CM | POA: Diagnosis not present

## 2016-04-21 DIAGNOSIS — Y999 Unspecified external cause status: Secondary | ICD-10-CM | POA: Diagnosis not present

## 2016-04-21 DIAGNOSIS — M542 Cervicalgia: Secondary | ICD-10-CM | POA: Diagnosis present

## 2016-04-21 DIAGNOSIS — J449 Chronic obstructive pulmonary disease, unspecified: Secondary | ICD-10-CM | POA: Insufficient documentation

## 2016-04-21 MED ORDER — METHOCARBAMOL 750 MG PO TABS: 750.0000 mg | ORAL_TABLET | Freq: Four times a day (QID) | ORAL | 0 refills | Status: DC

## 2016-04-21 MED ORDER — IBUPROFEN 800 MG PO TABS: 800.0000 mg | ORAL_TABLET | Freq: Three times a day (TID) | ORAL | 0 refills | Status: DC | PRN
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
## 2016-05-07 ENCOUNTER — Ambulatory Visit (INDEPENDENT_AMBULATORY_CARE_PROVIDER_SITE_OTHER): Payer: Medicaid Other | Admitting: Gastroenterology

## 2016-05-07 ENCOUNTER — Encounter: Payer: Self-pay | Admitting: Gastroenterology

## 2016-05-07 VITALS — BP 96/65 | HR 70 | Temp 97.9°F | Ht 72.0 in | Wt 147.0 lb

## 2016-05-07 DIAGNOSIS — R1310 Dysphagia, unspecified: Secondary | ICD-10-CM | POA: Diagnosis not present
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
## 2016-07-14 ENCOUNTER — Telehealth: Payer: Self-pay

## 2016-07-14 NOTE — Telephone Encounter (Signed)
Have the patient seen by surgery for possible closure.

## 2016-07-14 NOTE — Telephone Encounter (Signed)
Pt has been scheduled with Dr. Hampton Abbot next Wednesday, Dec 6th to discuss peg hole closure

## 2016-07-14 NOTE — Telephone Encounter (Signed)
Pt called today stating the hole where his peg tube was is still open. Its draining and "whistling". No pain coming from the area. This tube was removed in office on 05/07/16. Please advise.

## 2016-07-23 ENCOUNTER — Encounter: Payer: Self-pay | Admitting: Surgery

## 2016-07-23 ENCOUNTER — Ambulatory Visit (INDEPENDENT_AMBULATORY_CARE_PROVIDER_SITE_OTHER): Payer: Medicaid Other | Admitting: Surgery

## 2016-07-23 VITALS — BP 123/82 | HR 64 | Temp 98.1°F | Ht 72.0 in | Wt 141.8 lb

## 2016-07-23 DIAGNOSIS — K316 Fistula of stomach and duodenum: Secondary | ICD-10-CM | POA: Insufficient documentation

## 2016-07-23 NOTE — Patient Instructions (Signed)
I will be speaking with Dr. Dorothey Baseman nurse in regards to setting up your EGD to stretch your Esophagus. I will call you with the details of that appointment.  Keep a dressing over this area as long as this is draining. The drainage may be green, brown, or black for the next several days due to the medication that we put on this area at your appointment today.  We will see you back in 1 week to check on your wound and possibly another medication application in the clinic. Please see appointment information below.

## 2016-07-23 NOTE — Progress Notes (Signed)
07/23/2016  Reason for Visit:  Gastrocutaneous fistula  History of Present Illness: Mark [name] is a 60 y.o. 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 He's been followed by Dr. Grayland Ormond. Dr. Allen Norris had performed a PEG tube for nutritional needs. PEG tube was removed 2 months ago but the wound has failed to fully close. Patient reports that he still has gastric content fluid draining around incision and also hears air coming out. The skin around incision site is irritated due to the fluid that leaks around it.  Otherwise he denies any fevers, chills, chest pain, shortness of breath, nausea, vomiting. He does report having some dysphagia and Dr. Allen Norris had performed an esophageal dilation for stricture on June 2017. He is able tolerate soft solids as well as liquids.   Past Medical History: Past Medical History:  Diagnosis Date  . Arthritis    left hand  . Aspiration pneumonia ([place])   . Cancer ([place])    tonsilal ca  . COPD (chronic obstructive pulmonary disease) ([place])   . Incontinence    night time, began recently  . Memory deficit 11/18/2014  . PEG tube  11/18/2014  . Peripheral neuropathy ([place]) 11/18/2014  . Smokers' cough ([place])   . Tonsillar cancer, S/P surgery, chemotherapy XRT 2009-followed at [facility] 11/18/2014  . Wears dentures    full upper and lower     Past Surgical History: Past Surgical History:  Procedure Laterality Date  . APPENDECTOMY    . CHOLECYSTECTOMY  2007  . ESOPHAGOGASTRODUODENOSCOPY (EGD) WITH PROPOFOL N/A 01/24/2016   Procedure: ESOPHAGOGASTRODUODENOSCOPY (EGD) WITH gastric biopsy and esophageal dilation.;  Surgeon: [name], MD;  Location: [place];  Service: Endoscopy;  Laterality: N/A;  . PEG PLACEMENT N/A 11/28/2015   Procedure: PERCUTANEOUS ENDOSCOPIC GASTROSTOMY (PEG) PLACEMENT;   Surgeon: [name], MD;  Location: [place];  Service: Endoscopy;  Laterality: N/A;  . PEG TUBE PLACEMENT  10/22/12   10/22/12 - [facility], 10/23/14 - [facility], Dr. Allen Norris, replaced  . 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 Authorizing Provider  albuterol (PROVENTIL HFA;VENTOLIN HFA) 108 (90 BASE) MCG/ACT inhaler Inhale 2 puffs into the lungs every 6 (six) hours as needed for wheezing or shortness of breath.    Yes Historical Provider, MD  ARIPiprazole (ABILIFY) 2 MG tablet 2 mg by PEG Tube route daily.    Yes Historical Provider, MD  budesonide-formoterol (SYMBICORT) 160-4.5 MCG/ACT inhaler Inhale 2 puffs into the lungs 2 (two) times daily.   Yes Historical Provider, MD  buPROPion (WELLBUTRIN SR) 150 MG 12 hr tablet 150 mg by PEG Tube route 2 (two) times daily.   Yes Historical Provider, MD  clonazePAM (KLONOPIN) 1 MG tablet 1 mg by PEG Tube route 3 (three) times daily as needed for anxiety.    Yes Historical Provider, MD  Ipratropium-Albuterol (COMBIVENT) 20-100 MCG/ACT AERS respimat Inhale 2 puffs into the lungs 2 (two) times daily.   Yes Historical Provider, MD  mometasone (NASONEX) 50 MCG/ACT nasal spray Place 2 sprays into both nostrils 2 (two) times daily.   Yes Historical Provider, MD  Nutritional Supplements (GLUCERNA 1.5 CAL PO) 237 mLs by PEG Tube route See admin instructions. Pt uses up to seven times per day.  Yes Historical Provider, MD  Oxycodone HCl 20 MG TABS 20 mg by PEG Tube route every 4 (four) hours as needed (for pain).   Yes Historical Provider, MD  pantoprazole (PROTONIX) 40 MG tablet 40 mg by PEG Tube route at bedtime.    Yes Historical Provider, MD  traZODone (DESYREL) 150 MG tablet 150 mg by PEG Tube route at bedtime.   Yes Historical Provider, MD  venlafaxine (EFFEXOR) 37.5 MG tablet 37.5 mg by PEG Tube route daily.    Yes Historical Provider, MD  Water For Irrigation,  Sterile (FREE WATER) SOLN Place 200 mLs into feeding tube every 8 (eight) hours. 04/25/15  Yes Fritzi Mandes, MD    Allergies: Allergies  Allergen Reactions  . Morphine And Related Nausea And Vomiting    Social History:  reports that he has been smoking.  He has smoked for the past 40.00 years. He has never used smokeless tobacco. He reports that he does not drink alcohol or use drugs.   Family History: Family History  Problem Relation Age of Onset  . Liver cancer Mother   . Stroke Father   . Testicular cancer Father     Review of Systems: Review of Systems  Constitutional: Negative for chills and fever.  HENT: Negative for hearing loss.   Eyes: Negative for blurred vision.  Respiratory: Negative for cough and shortness of breath.   Cardiovascular: Negative for chest pain and leg swelling.  Gastrointestinal: Negative for abdominal pain, diarrhea, heartburn, nausea and vomiting.       Positive for dysphagia to solids.  Genitourinary: Negative for dysuria and hematuria.  Musculoskeletal: Negative for myalgias.  Skin: Negative for rash.  Neurological: Negative for dizziness.  Psychiatric/Behavioral: Negative for depression.  All other systems reviewed and are negative.   Physical Exam BP 123/82   Pulse 64   Temp 98.1 F (36.7 C) (Oral)   Ht 6' (1.829 m)   Wt 64.3 kg (141 lb 12.8 oz)   BMI 19.23 kg/m  CONSTITUTIONAL: No acute distress HEENT:  Normocephalic, atraumatic, extraocular motion intact. NECK: Trachea is midline, and there is no jugular venous distension.  RESPIRATORY:  Lungs are clear, and breath sounds are equal bilaterally. Normal respiratory effort without pathologic use of accessory muscles. CARDIOVASCULAR: Heart is regular without murmurs, gallops, or rubs. GI: The abdomen is soft, nondistended, nontender to palpation.. Patient has PEG site over the left upper quadrant with approximately an inch radius around of excoriated skin due to continued exposure to  gastric contents. There is no active drainage at this point from the incision. MUSCULOSKELETAL:  Normal muscle strength and tone in all four extremities.  No peripheral edema or cyanosis. SKIN: Skin turgor is normal. There are no pathologic skin lesions.  NEUROLOGIC:  Motor and sensation is grossly normal.  Cranial nerves are grossly intact. PSYCH:  Alert and oriented to person, place and time. Affect is normal.  Laboratory Analysis: No results found for this or any previous visit (from the past 24 hour(s)).  Imaging: No results found.  Assessment and Plan: This is a 60 y.o. 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  -Have applied silver nitrate to the fistula site in order to cause inflammation to attempt closure of the gastric cutaneous fistula. Patient tolerated this well without any complications. -Patient will come back to the office next week for follow-up of his wound to see if he may require another treatment for his fistula.   [place],  MD [facility]

## 2016-07-25 ENCOUNTER — Other Ambulatory Visit: Payer: Self-pay

## 2016-07-25 ENCOUNTER — Encounter: Payer: Self-pay | Admitting: *Deleted

## 2016-07-31 ENCOUNTER — Ambulatory Visit (INDEPENDENT_AMBULATORY_CARE_PROVIDER_SITE_OTHER): Payer: Medicaid Other | Admitting: Surgery

## 2016-07-31 ENCOUNTER — Encounter: Payer: Self-pay | Admitting: Surgery

## 2016-07-31 VITALS — BP 121/72 | HR 80 | Temp 98.0°F | Ht 72.0 in | Wt 147.0 lb

## 2016-07-31 DIAGNOSIS — K316 Fistula of stomach and duodenum: Secondary | ICD-10-CM | POA: Diagnosis not present

## 2016-07-31 NOTE — Progress Notes (Signed)
Outpatient Surgical Follow Up  07/31/2016  Mark [name] is an 60 y.o. male.   Chief Complaint  Patient presents with  . Follow-up    Peg Site did not close properly    HPI:  Mark [name] is a 60 y.o. 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 He's been followed by Dr. Grayland Ormond. Dr. Allen Norris had performed a PEG tube for nutritional needs. PEG tube was removed 2 months ago but the wound has failed to fully close. As we Dr. Roderick Pee iliac clean the area and did place some silver nitrate with good results. Since this patient reports that he is not noticed any output from the previous PEG site. He has actually gained 6 pounds and does have some dysphagia but continues to improve. He is tolerating a soft diet. No fevers, no chills, having bowel movements and passing gas. He continues to smoke half a pack a day  Past Medical History:  Diagnosis Date  . Arthritis    left hand  . Aspiration pneumonia ([place])   . 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 COPD (chronic obstructive pulmonary disease) ([place])   . Incontinence    night time, began recently  . Memory deficit 11/18/2014  . PEG tube 11/18/2014   removed summer 2017  . Peripheral neuropathy ([place]) 11/18/2014  . Smokers' cough ([place])   . Tonsillar cancer, S/P surgery, chemotherapy XRT 2009-followed at [facility] 11/18/2014  . Wears dentures    full upper and lower    Past Surgical History:  Procedure Laterality Date  . APPENDECTOMY    . CHOLECYSTECTOMY  2007  . ESOPHAGOGASTRODUODENOSCOPY (EGD) WITH PROPOFOL N/A 01/24/2016   Procedure: ESOPHAGOGASTRODUODENOSCOPY (EGD) WITH gastric biopsy and esophageal dilation.;  Surgeon: [name], MD;  Location: [place];  Service: Endoscopy;  Laterality: N/A;  . PEG PLACEMENT N/A 11/28/2015   Procedure: PERCUTANEOUS ENDOSCOPIC GASTROSTOMY  (PEG) PLACEMENT;  Surgeon: [name], MD;  Location: [place];  Service: Endoscopy;  Laterality: N/A;  . PEG TUBE PLACEMENT  10/22/12   10/22/12 - [facility], 10/23/14 - [facility], Dr. Allen Norris, replaced  . 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 Liver cancer Mother   . Stroke Father   . Testicular cancer Father     Social History:  reports that he has been smoking.  He has smoked for the past 40.00 years. He has never used smokeless tobacco. He reports that he does not drink alcohol or use drugs.  Allergies:  Allergies  Allergen Reactions  . Morphine And Related Nausea And Vomiting    Medications reviewed.   ROS Full ROS form and is otherwise negative other than what is stated in the history of present illness   BP 121/72   Pulse 80   Temp 98 F (36.7 C) (Oral)   Ht 6' (1.829 m)   Wt 66.7 kg (147 lb)   BMI 19.94 kg/m   Physical Exam  Constitutional: He is oriented to person, place, and time and well-developed, well-nourished, and in no distress. No distress.  Eyes: Conjunctivae are normal. Right eye exhibits no discharge. No scleral icterus.  Pulmonary/Chest: Effort normal. No respiratory distress.  Abdominal: Soft. He exhibits no distension. There is no tenderness. There is no rebound  and no guarding.  PEg site close w minimal surrounding erythema, no open wounds, small scab 2 mm in size. No infection. bandaid placed.  Musculoskeletal: Normal range of motion. He exhibits no edema.  Neurological: He is alert and oriented to person, place, and time. Gait normal. Coordination normal.  Skin: Skin is warm and dry. He is not diaphoretic.  Psychiatric: Mood, memory, affect and judgment normal.  Nursing note and vitals reviewed.   No results found for this or any previous visit (from the past 48 hour(s)). No results found.  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 Encourage to improve his nutritional status and to  stop smoking to make sure that this stays close. No need for any surgical intervention. F/U when necessary  Clayburn Pert, MD [facility] General Surgeon  07/31/2016,9:21 AM

## 2016-07-31 NOTE — Discharge Instructions (Signed)

## 2016-08-01 ENCOUNTER — Ambulatory Visit
Admission: RE | Admit: 2016-08-01 | Discharge: 2016-08-01 | Disposition: A | Discharge status: Home / Self Care | Payer: Medicaid Other | Source: Ambulatory Visit | Attending: Gastroenterology | Admitting: Gastroenterology

## 2016-08-01 ENCOUNTER — Ambulatory Visit: Payer: Medicaid Other | Admitting: Anesthesiology

## 2016-08-01 ENCOUNTER — Encounter
Admission: RE | Disposition: A | Discharge status: Home / Self Care | Payer: Self-pay | Source: Ambulatory Visit | Attending: Gastroenterology

## 2016-08-01 DIAGNOSIS — Z9981 Dependence on supplemental oxygen: Secondary | ICD-10-CM | POA: Diagnosis not present

## 2016-08-01 DIAGNOSIS — Z9221 Personal history of antineoplastic chemotherapy: Secondary | ICD-10-CM | POA: Insufficient documentation

## 2016-08-01 DIAGNOSIS — Z79899 Other long term (current) drug therapy: Secondary | ICD-10-CM | POA: Diagnosis not present

## 2016-08-01 DIAGNOSIS — K222 Esophageal obstruction: Secondary | ICD-10-CM | POA: Diagnosis not present

## 2016-08-01 DIAGNOSIS — R1310 Dysphagia, unspecified: Secondary | ICD-10-CM

## 2016-08-01 DIAGNOSIS — J449 Chronic obstructive pulmonary disease, unspecified: Secondary | ICD-10-CM | POA: Diagnosis not present

## 2016-08-01 DIAGNOSIS — F17210 Nicotine dependence, cigarettes, uncomplicated: Secondary | ICD-10-CM | POA: Insufficient documentation

## 2016-08-01 DIAGNOSIS — Z85818 Personal history of malignant neoplasm of other sites of lip, oral cavity, and pharynx: Secondary | ICD-10-CM | POA: Diagnosis not present

## 2016-08-01 HISTORY — PX: ESOPHAGEAL DILATION: SHX303

## 2016-08-01 HISTORY — PX: ESOPHAGOGASTRODUODENOSCOPY (EGD) WITH PROPOFOL: SHX5813

## 2016-08-01 SURGERY — ESOPHAGOGASTRODUODENOSCOPY (EGD) WITH PROPOFOL
Anesthesia: Monitor Anesthesia Care | Wound class: Clean Contaminated

## 2016-08-01 MED ORDER — LACTATED RINGERS IV SOLN
INTRAVENOUS | Status: DC
  Administered 2016-08-01: 08:00:00 via INTRAVENOUS

## 2016-08-01 MED ORDER — GLYCOPYRROLATE 0.2 MG/ML IJ SOLN
INTRAMUSCULAR | Status: DC | PRN
  Administered 2016-08-01: 0.2 mg via INTRAVENOUS

## 2016-08-01 MED ORDER — LIDOCAINE HCL (CARDIAC) 20 MG/ML IV SOLN
INTRAVENOUS | Status: DC | PRN
  Administered 2016-08-01: 700 mg via INTRAVENOUS
  Administered 2016-08-01: 50 mg via INTRAVENOUS

## 2016-08-01 MED ORDER — PROPOFOL 10 MG/ML IV BOLUS
INTRAVENOUS | Status: DC | PRN
  Administered 2016-08-01 (×3): 50 mg via INTRAVENOUS

## 2016-08-01 SURGICAL SUPPLY — 32 items
BALLN DILATOR 10-12 8 (BALLOONS)
BALLN DILATOR 12-15 8 (BALLOONS)
BALLN DILATOR 15-18 8 (BALLOONS) ×2
BALLN DILATOR CRE 0-12 8 (BALLOONS)
BALLN DILATOR ESOPH 8 10 CRE (MISCELLANEOUS) IMPLANT
BALLOON DILATOR 12-15 8 (BALLOONS) IMPLANT
BALLOON DILATOR 15-18 8 (BALLOONS) IMPLANT
BALLOON DILATOR CRE 0-12 8 (BALLOONS) IMPLANT
BLOCK BITE 60FR ADLT L/F GRN (MISCELLANEOUS) ×2 IMPLANT
CANISTER SUCT 1200ML W/VALVE (MISCELLANEOUS) ×2 IMPLANT
CLIP HMST 235XBRD CATH ROT (MISCELLANEOUS) IMPLANT
CLIP RESOLUTION 360 11X235 (MISCELLANEOUS)
FCP ESCP3.2XJMB 240X2.8X (MISCELLANEOUS)
FORCEPS BIOP RAD 4 LRG CAP 4 (CUTTING FORCEPS) IMPLANT
FORCEPS BIOP RJ4 240 W/NDL (MISCELLANEOUS)
FORCEPS ESCP3.2XJMB 240X2.8X (MISCELLANEOUS) IMPLANT
GOWN CVR UNV OPN BCK APRN NK (MISCELLANEOUS) ×2 IMPLANT
GOWN ISOL THUMB LOOP REG UNIV (MISCELLANEOUS) ×4
INJECTOR VARIJECT VIN23 (MISCELLANEOUS) IMPLANT
KIT DEFENDO VALVE AND CONN (KITS) IMPLANT
KIT ENDO PROCEDURE OLY (KITS) ×2 IMPLANT
MARKER SPOT ENDO TATTOO 5ML (MISCELLANEOUS) IMPLANT
PAD GROUND ADULT SPLIT (MISCELLANEOUS) IMPLANT
RETRIEVER NET PLAT FOOD (MISCELLANEOUS) IMPLANT
SNARE SHORT THROW 13M SML OVAL (MISCELLANEOUS) IMPLANT
SNARE SHORT THROW 30M LRG OVAL (MISCELLANEOUS) IMPLANT
SPOT EX ENDOSCOPIC TATTOO (MISCELLANEOUS)
SYR INFLATION 60ML (SYRINGE) ×1 IMPLANT
TRAP ETRAP POLY (MISCELLANEOUS) IMPLANT
VARIJECT INJECTOR VIN23 (MISCELLANEOUS)
WATER STERILE IRR 250ML POUR (IV SOLUTION) ×2 IMPLANT
WIRE CRE 18-20MM 8CM F G (MISCELLANEOUS) IMPLANT

## 2016-08-01 NOTE — Op Note (Signed)
[facility]ology Patient Name: Mark [name] Procedure Date: 08/01/2016 8:16 AM MRN: [id number] Account #: [id number] Date of Birth: 09/28/1955 Admit Type: Outpatient Age: 60 Room: [facility] OR ROOM 01 Gender: Male Note Status: Finalized Procedure:            Upper GI endoscopy Indications:          Dysphagia Providers:            [name] MD, MD Referring MD:         Otelia Limes. Duran (Referring MD) Medicines:            Propofol per Anesthesia Complications:        No immediate complications. Procedure:            Pre-Anesthesia Assessment:                       - Prior to the procedure, a History and Physical was                        performed, and patient medications and allergies were                        reviewed. The patient's tolerance of previous                        anesthesia was also reviewed. The risks and benefits of                        the procedure and the sedation options and risks were                        discussed with the patient. All questions were                        answered, and informed consent was obtained. Prior                        Anticoagulants: The patient has taken no previous                        anticoagulant or antiplatelet agents. ASA Grade                        Assessment: II - A patient with mild systemic disease.                        After reviewing the risks and benefits, the patient was                        deemed in satisfactory condition to undergo the                        procedure.                       After obtaining informed consent, the endoscope was                        passed under direct vision. Throughout the procedure,  the patient's blood pressure, pulse, and oxygen                        saturations were monitored continuously. The was                        introduced through the mouth, and advanced to the                        second part of  duodenum. The upper GI endoscopy was                        accomplished without difficulty. The patient tolerated                        the procedure well. Findings:      One moderate benign-appearing, intrinsic stenosis was found in the upper       third of the esophagus. And was traversed. A TTS dilator was passed       through the scope. Dilation with a 12-13.5-15 mm balloon dilator was       performed to 15 mm. The dilation site was examined following endoscope       reinsertion and showed moderate improvement in luminal narrowing.      The stomach was normal.      The examined duodenum was normal. Impression:           - Benign-appearing esophageal stenosis. Dilated.                       - Normal stomach.                       - Normal examined duodenum.                       - No specimens collected. Recommendation:       - Discharge patient to home.                       - Resume previous diet.                       - Continue present medications. Procedure Code(s):    --- Professional ---                       [phone], Esophagogastroduodenoscopy, flexible, transoral;                        with transendoscopic balloon dilation of esophagus                        (less than 30 mm diameter) Diagnosis Code(s):    --- Professional ---                       R13.10, Dysphagia, unspecified                       K22.2, Esophageal obstruction CPT copyright 2016 American Medical Association. All rights reserved. The codes documented in this report are preliminary and upon coder review may  be revised to meet current compliance requirements. [name] MD, MD 08/01/2016 8:33:28 AM This  report has been signed electronically. Number of Addenda: 0 Note Initiated On: 08/01/2016 8:16 AM Total Procedure Duration: 0 hours 4 minutes 58 seconds       [facility]

## 2016-08-01 NOTE — Transfer of Care (Signed)
Immediate Anesthesia Transfer of Care Note  Patient: Mark [name]  Procedure(s) Performed: Procedure(s): ESOPHAGOGASTRODUODENOSCOPY (EGD) WITH PROPOFOL (N/A) ESOPHAGEAL DILATION (N/A)  Patient Location: PACU  Anesthesia Type: General, MAC  Level of Consciousness: awake, alert  and patient cooperative  Airway and Oxygen Therapy: Patient Spontanous Breathing and Patient connected to supplemental oxygen  Post-op Assessment: Post-op Vital signs reviewed, Patient's Cardiovascular Status Stable, Respiratory Function Stable, Patent Airway and No signs of Nausea or vomiting  Post-op Vital Signs: Reviewed and stable  Complications: No apparent anesthesia complications

## 2016-08-01 NOTE — H&P (Signed)
[name], MD [facility] [address], [place], [place] [address] Phone: [phone] Fax : [phone]  Primary Care Physician:  Isaias Cowman, PA-C Primary Gastroenterologist:  Dr. Allen Norris  Pre-Procedure History & Physical: HPI:  Mark [name] is a 60 y.o. male is here for an endoscopy.   Past Medical History:  Diagnosis Date  . Arthritis    left hand  . Aspiration pneumonia ([place])   . Cancer ([place])    tonsilal ca  . COPD (chronic obstructive pulmonary disease) ([place])   . Incontinence    night time, began recently  . Memory deficit 11/18/2014  . PEG tube 11/18/2014   removed summer 2017  . Peripheral neuropathy ([place]) 11/18/2014  . Smokers' cough ([place])   . Tonsillar cancer, S/P surgery, chemotherapy XRT 2009-followed at [facility] 11/18/2014  . Wears dentures    full upper and lower    Past Surgical History:  Procedure Laterality Date  . APPENDECTOMY    . CHOLECYSTECTOMY  2007  . ESOPHAGOGASTRODUODENOSCOPY (EGD) WITH PROPOFOL N/A 01/24/2016   Procedure: ESOPHAGOGASTRODUODENOSCOPY (EGD) WITH gastric biopsy and esophageal dilation.;  Surgeon: [name], MD;  Location: [place];  Service: Endoscopy;  Laterality: N/A;  . PEG PLACEMENT N/A 11/28/2015   Procedure: PERCUTANEOUS ENDOSCOPIC GASTROSTOMY (PEG) PLACEMENT;  Surgeon: [name], MD;  Location: [place];  Service: Endoscopy;  Laterality: N/A;  . PEG TUBE PLACEMENT  10/22/12   10/22/12 - [facility], 10/23/14 - [facility], Dr. Allen Norris, replaced  . 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 Authorizing Provider  albuterol (PROVENTIL HFA;VENTOLIN HFA) 108 (90 BASE) MCG/ACT inhaler Inhale 2 puffs into the lungs every 6 (six) hours as needed for wheezing or shortness of breath.    Yes Historical Provider, MD  ARIPiprazole (ABILIFY) 2 MG tablet 2 mg by PEG Tube route daily.    Yes Historical Provider, MD  budesonide-formoterol (SYMBICORT) 160-4.5  MCG/ACT inhaler Inhale 2 puffs into the lungs 2 (two) times daily.   Yes Historical Provider, MD  clonazePAM (KLONOPIN) 1 MG tablet 1 mg by PEG Tube route 3 (three) times daily as needed for anxiety.    Yes Historical Provider, MD  DULoxetine (CYMBALTA) 60 MG capsule Take 60 mg by mouth daily.   Yes Historical Provider, MD  Ipratropium-Albuterol (COMBIVENT) 20-100 MCG/ACT AERS respimat Inhale 2 puffs into the lungs 2 (two) times daily.   Yes Historical Provider, MD  mometasone (NASONEX) 50 MCG/ACT nasal spray Place 2 sprays into both nostrils 2 (two) times daily.   Yes Historical Provider, MD  Oxycodone HCl 20 MG TABS 20 mg by PEG Tube route every 4 (four) hours as needed (for pain).   Yes Historical Provider, MD  pantoprazole (PROTONIX) 40 MG tablet 40 mg by PEG Tube route at bedtime.    Yes Historical Provider, MD  traZODone (DESYREL) 150 MG tablet 150 mg by PEG Tube route at bedtime.   Yes Historical Provider, MD  venlafaxine (EFFEXOR) 37.5 MG tablet 37.5 mg by PEG Tube route daily.    Yes Historical Provider, MD  Water For Irrigation, Sterile (FREE WATER) SOLN Place 200 mLs into feeding tube every 8 (eight) hours. 04/25/15  Yes Fritzi Mandes, MD    Allergies as of 07/25/2016 - Review Complete 07/25/2016  Allergen Reaction Noted  . Morphine and related Nausea And Vomiting 11/18/2014    Family History  Problem Relation Age of Onset  . Liver cancer  Mother   . Stroke Father   . Testicular cancer Father     Social History   Social History  . Marital status: Married    Spouse name: N/A  . Number of children: N/A  . Years of education: N/A   Occupational History  . Not on file.   Social History Main Topics  . Smoking status: Current Every Day Smoker    Years: 40.00  . Smokeless tobacco: Never Used     Comment: was 2 PPD, now 2 cigs/day  . Alcohol use No  . Drug use: No  . Sexual activity: Not on file   Other Topics Concern  . 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 Neck:  Supple; no masses or thyromegaly. Lungs:  Clear throughout to auscultation.    Heart:  Regular rate and rhythm. Abdomen:  Soft, nontender and nondistended. Normal bowel sounds, without guarding, and without rebound.   Neurologic:  Alert and  oriented x4;  grossly normal neurologically.  Impression/Plan: Mark [name] is here for an endoscopy to be performed for dysphagia  Risks, benefits, limitations, and alternatives regarding  endoscopy have been reviewed with the patient.  Questions have been answered.  All parties agreeable.   [name], MD  08/01/2016, 7:42 AM

## 2016-08-01 NOTE — Anesthesia Preprocedure Evaluation (Signed)
Anesthesia Evaluation    Airway Mallampati: II  TM Distance: >3 FB Neck ROM: Full    Dental no notable dental hx.    Pulmonary COPD, Current Smoker,  Home O2 at night only   Pulmonary exam normal breath sounds clear to auscultation       Cardiovascular negative cardio ROS Normal cardiovascular exam Rhythm:Regular Rate:Normal     Neuro/Psych  Neuromuscular disease    GI/Hepatic   Endo/Other    Renal/GU      Musculoskeletal  (+) Arthritis ,   Abdominal   Peds  Hematology   Anesthesia Other Findings   Reproductive/Obstetrics                             Anesthesia Physical Anesthesia Plan  ASA: III  Anesthesia Plan: General and MAC   Post-op Pain Management:    Induction: Intravenous  Airway Management Planned:   Additional Equipment:   Intra-op Plan:   Post-operative Plan: Extubation in OR  Informed Consent: I have reviewed the patients History and Physical, chart, labs and discussed the procedure including the risks, benefits and alternatives for the proposed anesthesia with the patient or authorized representative who has indicated his/her understanding and acceptance.   Dental advisory given  Plan Discussed with: CRNA  Anesthesia Plan Comments:         Anesthesia Quick Evaluation

## 2016-08-01 NOTE — Anesthesia Procedure Notes (Signed)
Procedure Name: MAC Performed by: Honora Searson Pre-anesthesia Checklist: Patient identified, Emergency Drugs available, Suction available, Timeout performed and Patient being monitored Patient Re-evaluated:Patient Re-evaluated prior to inductionOxygen Delivery Method: Nasal cannula Placement Confirmation: positive ETCO2     

## 2016-08-01 NOTE — Anesthesia Postprocedure Evaluation (Signed)
Anesthesia Post Note  Patient: Mark [name]  Procedure(s) Performed: Procedure(s) (LRB): ESOPHAGOGASTRODUODENOSCOPY (EGD) WITH PROPOFOL (N/A) ESOPHAGEAL DILATION (N/A)  Patient location during evaluation: PACU Anesthesia Type: MAC Level of consciousness: awake and alert Pain management: pain level controlled Vital Signs Assessment: post-procedure vital signs reviewed and stable Respiratory status: spontaneous breathing, nonlabored ventilation, respiratory function stable and patient connected to nasal cannula oxygen Cardiovascular status: stable and blood pressure returned to baseline Anesthetic complications: no    Tonita Bills C

## 2016-08-04 ENCOUNTER — Encounter: Payer: Self-pay | Admitting: Gastroenterology

## 2017-01-29 ENCOUNTER — Other Ambulatory Visit: Payer: Self-pay

## 2017-01-29 ENCOUNTER — Telehealth: Payer: Self-pay

## 2017-01-29 DIAGNOSIS — R1310 Dysphagia, unspecified: Secondary | ICD-10-CM

## 2017-01-29 NOTE — Telephone Encounter (Signed)
Pt contacted office to schedule EGD. Referral was in workque.  He has been scheduled for EGD with Dr. Allen Norris on 02/19/17 at [facility].

## 2017-01-29 NOTE — Telephone Encounter (Signed)
02/19/17 EGD Scheduled at [facility] with Dr. Allen Norris

## 2017-02-10 ENCOUNTER — Encounter: Payer: Self-pay | Admitting: *Deleted

## 2017-02-13 NOTE — Discharge Instructions (Signed)
General Anesthesia, Adult, Care After °These instructions provide you with information about caring for yourself after your procedure. Your health care provider may also give you more specific instructions. Your treatment has been planned according to current medical practices, but problems sometimes occur. Call your health care provider if you have any problems or questions after your procedure. °What can I expect after the procedure? °After the procedure, it is common to have: °· Vomiting. °· A sore throat. °· Mental slowness. ° °It is common to feel: °· Nauseous. °· Cold or shivery. °· Sleepy. °· Tired. °· Sore or achy, even in parts of your body where you did not have surgery. ° °Follow these instructions at home: °For at least 24 hours after the procedure: °· Do not: °? Participate in activities where you could fall or become injured. °? Drive. °? Use heavy machinery. °? Drink alcohol. °? Take sleeping pills or medicines that cause drowsiness. °? Make important decisions or sign legal documents. °? Take care of children on your own. °· Rest. °Eating and drinking °· If you vomit, drink water, juice, or soup when you can drink without vomiting. °· Drink enough fluid to keep your urine clear or pale yellow. °· Make sure you have little or no nausea before eating solid foods. °· Follow the diet recommended by your health care provider. °General instructions °· Have a responsible adult stay with you until you are awake and alert. °· Return to your normal activities as told by your health care provider. Ask your health care provider what activities are safe for you. °· Take over-the-counter and prescription medicines only as told by your health care provider. °· If you smoke, do not smoke without supervision. °· Keep all follow-up visits as told by your health care provider. This is important. °Contact a health care provider if: °· You continue to have nausea or vomiting at home, and medicines are not helpful. °· You  cannot drink fluids or start eating again. °· You cannot urinate after 8-12 hours. °· You develop a skin rash. °· You have fever. °· You have increasing redness at the site of your procedure. °Get help right away if: °· You have difficulty breathing. °· You have chest pain. °· You have unexpected bleeding. °· You feel that you are having a life-threatening or urgent problem. °This information is not intended to replace advice given to you by your health care provider. Make sure you discuss any questions you have with your health care provider. °Document Released: 11/10/2000 Document Revised: 01/07/2016 Document Reviewed: 07/19/2015 °Elsevier Interactive Patient Education © 2018 Elsevier Inc. ° °

## 2017-02-19 ENCOUNTER — Ambulatory Visit: Payer: Medicaid Other | Admitting: Anesthesiology

## 2017-02-19 ENCOUNTER — Ambulatory Visit
Admission: RE | Admit: 2017-02-19 | Discharge: 2017-02-19 | Disposition: A | Discharge status: Home / Self Care | Payer: Medicaid Other | Source: Ambulatory Visit | Attending: Gastroenterology | Admitting: Gastroenterology

## 2017-02-19 ENCOUNTER — Encounter
Admission: RE | Disposition: A | Discharge status: Home / Self Care | Payer: Self-pay | Source: Ambulatory Visit | Attending: Gastroenterology

## 2017-02-19 DIAGNOSIS — K2970 Gastritis, unspecified, without bleeding: Secondary | ICD-10-CM

## 2017-02-19 DIAGNOSIS — K2930 Chronic superficial gastritis without bleeding: Secondary | ICD-10-CM | POA: Insufficient documentation

## 2017-02-19 DIAGNOSIS — F17210 Nicotine dependence, cigarettes, uncomplicated: Secondary | ICD-10-CM | POA: Insufficient documentation

## 2017-02-19 DIAGNOSIS — Z85818 Personal history of malignant neoplasm of other sites of lip, oral cavity, and pharynx: Secondary | ICD-10-CM | POA: Diagnosis not present

## 2017-02-19 DIAGNOSIS — G629 Polyneuropathy, unspecified: Secondary | ICD-10-CM | POA: Diagnosis not present

## 2017-02-19 DIAGNOSIS — Z79899 Other long term (current) drug therapy: Secondary | ICD-10-CM | POA: Diagnosis not present

## 2017-02-19 DIAGNOSIS — R1310 Dysphagia, unspecified: Secondary | ICD-10-CM | POA: Diagnosis not present

## 2017-02-19 DIAGNOSIS — J449 Chronic obstructive pulmonary disease, unspecified: Secondary | ICD-10-CM | POA: Diagnosis not present

## 2017-02-19 DIAGNOSIS — K222 Esophageal obstruction: Secondary | ICD-10-CM | POA: Diagnosis not present

## 2017-02-19 HISTORY — PX: ESOPHAGEAL DILATION: SHX303

## 2017-02-19 HISTORY — PX: ESOPHAGOGASTRODUODENOSCOPY (EGD) WITH PROPOFOL: SHX5813

## 2017-02-19 SURGERY — ESOPHAGOGASTRODUODENOSCOPY (EGD) WITH PROPOFOL
Anesthesia: General

## 2017-02-19 MED ORDER — ACETAMINOPHEN 325 MG PO TABS: 325.0000 mg | ORAL_TABLET | ORAL | Status: DC | PRN

## 2017-02-19 MED ORDER — ACETAMINOPHEN 160 MG/5ML PO SOLN: 325.0000 mg | ORAL | Status: DC | PRN

## 2017-02-19 MED ORDER — GLYCOPYRROLATE 0.2 MG/ML IJ SOLN
INTRAMUSCULAR | Status: DC | PRN
  Administered 2017-02-19: 0.2 mg via INTRAVENOUS

## 2017-02-19 MED ORDER — STERILE WATER FOR IRRIGATION IR SOLN
Status: DC | PRN
  Administered 2017-02-19: 08:00:00

## 2017-02-19 MED ORDER — LACTATED RINGERS IV SOLN
INTRAVENOUS | Status: DC
Start: 2017-02-19 — End: 2017-02-19
  Administered 2017-02-19: 08:00:00 via INTRAVENOUS

## 2017-02-19 MED ORDER — LIDOCAINE HCL (CARDIAC) 20 MG/ML IV SOLN
INTRAVENOUS | Status: DC | PRN
  Administered 2017-02-19: 40 mg via INTRAVENOUS

## 2017-02-19 MED ORDER — PROPOFOL 10 MG/ML IV BOLUS
INTRAVENOUS | Status: DC | PRN
Start: 2017-02-19 — End: 2017-02-19
  Administered 2017-02-19: 50 mg via INTRAVENOUS
  Administered 2017-02-19: 30 mg via INTRAVENOUS
  Administered 2017-02-19: 20 mg via INTRAVENOUS

## 2017-02-19 SURGICAL SUPPLY — 32 items
BALLN DILATOR 10-12 8 (BALLOONS)
BALLN DILATOR 12-15 8 (BALLOONS) ×3
BALLN DILATOR 15-18 8 (BALLOONS) ×3
BALLN DILATOR CRE 0-12 8 (BALLOONS)
BALLN DILATOR ESOPH 8 10 CRE (MISCELLANEOUS) IMPLANT
BALLOON DILATOR 12-15 8 (BALLOONS) IMPLANT
BALLOON DILATOR 15-18 8 (BALLOONS) IMPLANT
BALLOON DILATOR CRE 0-12 8 (BALLOONS) IMPLANT
BLOCK BITE 60FR ADLT L/F GRN (MISCELLANEOUS) ×3 IMPLANT
CANISTER SUCT 1200ML W/VALVE (MISCELLANEOUS) ×3 IMPLANT
CLIP HMST 235XBRD CATH ROT (MISCELLANEOUS) IMPLANT
CLIP RESOLUTION 360 11X235 (MISCELLANEOUS)
FCP ESCP3.2XJMB 240X2.8X (MISCELLANEOUS)
FORCEPS BIOP RAD 4 LRG CAP 4 (CUTTING FORCEPS) ×2 IMPLANT
FORCEPS BIOP RJ4 240 W/NDL (MISCELLANEOUS)
FORCEPS ESCP3.2XJMB 240X2.8X (MISCELLANEOUS) IMPLANT
GOWN CVR UNV OPN BCK APRN NK (MISCELLANEOUS) ×2 IMPLANT
GOWN ISOL THUMB LOOP REG UNIV (MISCELLANEOUS) ×6
INJECTOR VARIJECT VIN23 (MISCELLANEOUS) IMPLANT
KIT DEFENDO VALVE AND CONN (KITS) IMPLANT
KIT ENDO PROCEDURE OLY (KITS) ×3 IMPLANT
MARKER SPOT ENDO TATTOO 5ML (MISCELLANEOUS) IMPLANT
PAD GROUND ADULT SPLIT (MISCELLANEOUS) IMPLANT
RETRIEVER NET PLAT FOOD (MISCELLANEOUS) IMPLANT
SNARE SHORT THROW 13M SML OVAL (MISCELLANEOUS) IMPLANT
SNARE SHORT THROW 30M LRG OVAL (MISCELLANEOUS) IMPLANT
SPOT EX ENDOSCOPIC TATTOO (MISCELLANEOUS)
SYR INFLATION 60ML (SYRINGE) ×2 IMPLANT
TRAP ETRAP POLY (MISCELLANEOUS) IMPLANT
VARIJECT INJECTOR VIN23 (MISCELLANEOUS)
WATER STERILE IRR 250ML POUR (IV SOLUTION) ×3 IMPLANT
WIRE CRE 18-20MM 8CM F G (MISCELLANEOUS) IMPLANT

## 2017-02-19 NOTE — H&P (Signed)
[name], MD [address] [place], [place] [address] Phone:[phone] Fax : [phone]  Primary Care Physician:  Secundino Ginger, PA-C Primary Gastroenterologist:  Dr. Allen Norris  Pre-Procedure History & Physical: HPI:  Mark [name] is a 61 y.o. male is here for an endoscopy.   Past Medical History:  Diagnosis Date  . Arthritis    left hand  . Aspiration pneumonia ([place])   . Cancer ([place])    tonsilal ca  . COPD (chronic obstructive pulmonary disease) ([place])   . Incontinence    night time, began recently  . Memory deficit 11/18/2014  . PEG tube 11/18/2014   removed summer 2017  . Peripheral neuropathy 11/18/2014  . Smokers' cough ([place])   . Tonsillar cancer, S/P surgery, chemotherapy XRT 2009-followed at [facility] 11/18/2014  . Wears dentures    full upper and lower    Past Surgical History:  Procedure Laterality Date  . APPENDECTOMY    . CHOLECYSTECTOMY  2007  . ESOPHAGEAL DILATION N/A 08/01/2016   Procedure: ESOPHAGEAL DILATION;  Surgeon: [name], MD;  Location: [place];  Service: Endoscopy;  Laterality: N/A;  . ESOPHAGOGASTRODUODENOSCOPY (EGD) WITH PROPOFOL N/A 01/24/2016   Procedure: ESOPHAGOGASTRODUODENOSCOPY (EGD) WITH gastric biopsy and esophageal dilation.;  Surgeon: [name], MD;  Location: [place];  Service: Endoscopy;  Laterality: N/A;  . ESOPHAGOGASTRODUODENOSCOPY (EGD) WITH PROPOFOL N/A 08/01/2016   Procedure: ESOPHAGOGASTRODUODENOSCOPY (EGD) WITH PROPOFOL;  Surgeon: [name], MD;  Location: [place];  Service: Endoscopy;  Laterality: N/A;  . PEG PLACEMENT N/A 11/28/2015   Procedure: PERCUTANEOUS ENDOSCOPIC GASTROSTOMY (PEG) PLACEMENT;  Surgeon: [name], MD;  Location: [place];  Service: Endoscopy;  Laterality: N/A;  . PEG TUBE PLACEMENT  10/22/12   10/22/12 - [facility], 10/23/14 - [facility], Dr. Allen Norris, replaced  . TONSILLECTOMY     jan 2010--stage IV    Prior to Admission medications   Medication Sig  Start Date End Date Taking? Authorizing Provider  albuterol (PROVENTIL HFA;VENTOLIN HFA) 108 (90 BASE) MCG/ACT inhaler Inhale 2 puffs into the lungs every 6 (six) hours as needed for wheezing or shortness of breath.    Yes [provider]  ARIPiprazole (ABILIFY) 2 MG tablet 2 mg by PEG Tube route daily.    Yes [provider]  budesonide-formoterol (SYMBICORT) 160-4.5 MCG/ACT inhaler Inhale 2 puffs into the lungs 2 (two) times daily.   Yes [provider]  clonazePAM (KLONOPIN) 1 MG tablet 1 mg by PEG Tube route 3 (three) times daily as needed for anxiety.    Yes [provider]  DULoxetine (CYMBALTA) 60 MG capsule Take 60 mg by mouth daily.   Yes [provider]  Ipratropium-Albuterol (COMBIVENT) 20-100 MCG/ACT AERS respimat Inhale 2 puffs into the lungs 2 (two) times daily.   Yes [provider]  mometasone (NASONEX) 50 MCG/ACT nasal spray Place 2 sprays into both nostrils 2 (two) times daily.   Yes [provider]  Oxycodone HCl 20 MG TABS 20 mg by PEG Tube route every 4 (four) hours as needed (for pain).   Yes [provider]  pantoprazole (PROTONIX) 40 MG tablet 40 mg by PEG Tube route at bedtime.    Yes [provider]  traZODone (DESYREL) 150 MG tablet 150 mg by PEG Tube route at bedtime.   Yes [provider]  venlafaxine (EFFEXOR) 37.5 MG tablet 37.5 mg by PEG Tube route daily.    Yes [provider]  Water For Irrigation, Sterile (  FREE WATER) SOLN Place 200 mLs into feeding tube every 8 (eight) hours. 04/25/15  Yes Fritzi Mandes, MD    Allergies as of 01/29/2017 - Review Complete 08/01/2016  Allergen Reaction Noted  . Morphine and related Nausea And Vomiting 11/18/2014    Family History  Problem Relation Age of Onset  . Liver cancer Mother   . Stroke Father   . Testicular cancer Father     Social History   Social History  . Marital status: Married    Spouse name: N/A  . Number of  children: N/A  . Years of education: N/A   Occupational History  . Not on file.   Social History Main Topics  . Smoking status: Current Every Day Smoker    Years: 40.00  . Smokeless tobacco: Never Used     Comment: was 2 PPD, now 2 cigs/day  . Alcohol use No  . Drug use: No  . Sexual activity: Not on file   Other Topics Concern  . 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 Neck:  Supple; no masses or thyromegaly. Lungs:  Clear throughout to auscultation.    Heart:  Regular rate and rhythm. Abdomen:  Soft, nontender and nondistended. Normal bowel sounds, without guarding, and without rebound.   Neurologic:  Alert and  oriented x4;  grossly normal neurologically.  Impression/Plan: Mark [name] is here for an endoscopy to be performed for dysphagia  Risks, benefits, limitations, and alternatives regarding  endoscopy have been reviewed with the patient.  Questions have been answered.  All parties agreeable.   [name], MD  02/19/2017, 7:36 AM

## 2017-02-19 NOTE — Anesthesia Preprocedure Evaluation (Signed)
Anesthesia Evaluation  Patient identified by MRN, date of birth, ID band Patient awake    Reviewed: Allergy & Precautions, H&P , NPO status , Patient's Chart, lab work & pertinent test results, reviewed documented beta blocker date and time   Airway Mallampati: I  TM Distance: >3 FB Neck ROM: full    Dental  (+) Edentulous Lower, Edentulous Upper   Pulmonary COPD, Current Smoker,  Hx of tosillar cancer, s/p XRT, chemo   Pulmonary exam normal breath sounds clear to auscultation       Cardiovascular Exercise Tolerance: Good negative cardio ROS Normal cardiovascular exam Rhythm:regular Rate:Normal     Neuro/Psych negative neurological ROS  negative psych ROS   GI/Hepatic negative GI ROS, Neg liver ROS,   Endo/Other  negative endocrine ROS  Renal/GU negative Renal ROS  negative genitourinary   Musculoskeletal  (+) Arthritis ,   Abdominal   Peds  Hematology negative hematology ROS (+)   Anesthesia Other Findings   Reproductive/Obstetrics negative OB ROS                             Anesthesia Physical  Anesthesia Plan  ASA: III  Anesthesia Plan: General   Post-op Pain Management:    Induction:   PONV Risk Score and Plan:   Airway Management Planned:   Additional Equipment:   Intra-op Plan:   Post-operative Plan:   Informed Consent: I have reviewed the patients History and Physical, chart, labs and discussed the procedure including the risks, benefits and alternatives for the proposed anesthesia with the patient or authorized representative who has indicated his/her understanding and acceptance.   Dental Advisory Given  Plan Discussed with: CRNA and Anesthesiologist  Anesthesia Plan Comments:         Anesthesia Quick Evaluation  

## 2017-02-19 NOTE — Anesthesia Postprocedure Evaluation (Signed)
Anesthesia Post Note  Patient: Mark [name]  Procedure(s) Performed: Procedure(s) (LRB): ESOPHAGOGASTRODUODENOSCOPY (EGD) WITH PROPOFOL (N/A) ESOPHAGEAL DILATION (N/A)  Patient location during evaluation: PACU Anesthesia Type: General Level of consciousness: awake and alert Pain management: pain level controlled Vital Signs Assessment: post-procedure vital signs reviewed and stable Respiratory status: spontaneous breathing, nonlabored ventilation and respiratory function stable Cardiovascular status: blood pressure returned to baseline and stable Postop Assessment: no signs of nausea or vomiting Anesthetic complications: no    Trecia Rogers

## 2017-02-19 NOTE — Transfer of Care (Signed)
Immediate Anesthesia Transfer of Care Note  Patient: Mark [name]  Procedure(s) Performed: Procedure(s): ESOPHAGOGASTRODUODENOSCOPY (EGD) WITH PROPOFOL (N/A) ESOPHAGEAL DILATION (N/A)  Patient Location: PACU  Anesthesia Type: General  Level of Consciousness: awake, alert  and patient cooperative  Airway and Oxygen Therapy: Patient Spontanous Breathing and Patient connected to supplemental oxygen  Post-op Assessment: Post-op Vital signs reviewed, Patient's Cardiovascular Status Stable, Respiratory Function Stable, Patent Airway and No signs of Nausea or vomiting  Post-op Vital Signs: Reviewed and stable  Complications: No apparent anesthesia complications

## 2017-02-19 NOTE — Op Note (Signed)
[facility] Gastroenterology Patient Name: Mark [name] Procedure Date: 02/19/2017 8:14 AM MRN: [id number] Account #: [id number] Date of Birth: Jun 10, 1956 Admit Type: Outpatient Age: 61 Room: [facility] OR ROOM 01 Gender: Male Note Status: Finalized Procedure:            Upper GI endoscopy Indications:          Dysphagia Providers:            [name] MD, MD Referring MD:         Otelia Limes. Duran PA, PA (Referring MD) Medicines:            Propofol per Anesthesia Complications:        No immediate complications. Procedure:            Pre-Anesthesia Assessment:                       - Prior to the procedure, a History and Physical was                        performed, and patient medications and allergies were                        reviewed. The patient's tolerance of previous                        anesthesia was also reviewed. The risks and benefits of                        the procedure and the sedation options and risks were                        discussed with the patient. All questions were                        answered, and informed consent was obtained. Prior                        Anticoagulants: The patient has taken no previous                        anticoagulant or antiplatelet agents. ASA Grade                        Assessment: II - A patient with mild systemic disease.                        After reviewing the risks and benefits, the patient was                        deemed in satisfactory condition to undergo the                        procedure.                       After obtaining informed consent, the endoscope was                        passed under direct vision. Throughout the procedure,  the patient's blood pressure, pulse, and oxygen                        saturations were monitored continuously. The Olympus                        190 Endoscope [id number] was introduced through the                        mouth,  and advanced to the second part of duodenum. The                        upper GI endoscopy was accomplished without difficulty.                        The patient tolerated the procedure well. Findings:      One moderate benign-appearing, intrinsic stenosis was found at the lower       esophageal sphincter. And was traversed. A TTS dilator was passed       through the scope. Dilation with a 12-13.5-15 mm balloon and a       15-16.5-18 mm balloon dilator was performed to 16.5 mm. The dilation       site was examined following endoscope reinsertion and showed moderate       improvement in luminal narrowing.      One moderate benign-appearing, intrinsic stenosis was found in the upper       third of the esophagus. And was traversed. A TTS dilator was passed       through the scope. Dilation with a 15-16.5-18 mm balloon dilator was       performed to 16.5 mm.      Localized moderate inflammation characterized by erythema was found in       the gastric antrum. Biopsies were taken with a cold forceps for       histology.      The examined duodenum was normal. Impression:           - Benign-appearing esophageal stenosis. Dilated.                       - Benign-appearing esophageal stenosis. Dilated.                       - Gastritis. Biopsied.                       - Normal examined duodenum. Recommendation:       - Await pathology results.                       - Discharge patient to home.                       - Resume previous diet.                       - Continue present medications.                       - Await pathology results.                       - Repeat upper endoscopy in 6 weeks for retreatment. Procedure  Code(s):    --- Professional ---                       [phone], Esophagogastroduodenoscopy, flexible, transoral;                        with transendoscopic balloon dilation of esophagus                        (less than 30 mm diameter)                       [id number],  Esophagogastroduodenoscopy, flexible, transoral;                        with biopsy, single or multiple Diagnosis Code(s):    --- Professional ---                       R13.10, Dysphagia, unspecified                       K29.70, Gastritis, unspecified, without bleeding                       K22.2, Esophageal obstruction CPT copyright 2016 American Medical Association. All rights reserved. The codes documented in this report are preliminary and upon coder review may  be revised to meet current compliance requirements. [name] MD, MD 02/19/2017 8:34:12 AM This report has been signed electronically. Number of Addenda: 0 Note Initiated On: 02/19/2017 8:14 AM      [facility]

## 2017-02-19 NOTE — Anesthesia Procedure Notes (Signed)
Procedure Name: MAC Date/Time: 02/19/2017 8:20 AM Performed by: Janna Arch Pre-anesthesia Checklist: Patient identified, Emergency Drugs available, Suction available and Patient being monitored Patient Re-evaluated:Patient Re-evaluated prior to induction

## 2017-02-20 ENCOUNTER — Encounter: Payer: Self-pay | Admitting: Gastroenterology

## 2017-02-26 ENCOUNTER — Encounter: Payer: Self-pay | Admitting: Gastroenterology

## 2017-03-02 ENCOUNTER — Encounter: Payer: Self-pay | Admitting: Gastroenterology

## 2017-04-06 ENCOUNTER — Inpatient Hospital Stay
Admission: EM | Admit: 2017-04-06 | Discharge: 2017-04-11 | DRG: 871 | Disposition: A | Discharge status: Home / Self Care | Payer: Medicaid Other | Attending: Specialist | Admitting: Specialist

## 2017-04-06 ENCOUNTER — Emergency Department: Payer: Medicaid Other

## 2017-04-06 DIAGNOSIS — F419 Anxiety disorder, unspecified: Secondary | ICD-10-CM | POA: Diagnosis present

## 2017-04-06 DIAGNOSIS — G629 Polyneuropathy, unspecified: Secondary | ICD-10-CM | POA: Diagnosis present

## 2017-04-06 DIAGNOSIS — A419 Sepsis, unspecified organism: Principal | ICD-10-CM

## 2017-04-06 DIAGNOSIS — E43 Unspecified severe protein-calorie malnutrition: Secondary | ICD-10-CM | POA: Diagnosis present

## 2017-04-06 DIAGNOSIS — D696 Thrombocytopenia, unspecified: Secondary | ICD-10-CM | POA: Diagnosis present

## 2017-04-06 DIAGNOSIS — N179 Acute kidney failure, unspecified: Secondary | ICD-10-CM | POA: Diagnosis present

## 2017-04-06 DIAGNOSIS — G894 Chronic pain syndrome: Secondary | ICD-10-CM | POA: Diagnosis present

## 2017-04-06 DIAGNOSIS — Z885 Allergy status to narcotic agent status: Secondary | ICD-10-CM

## 2017-04-06 DIAGNOSIS — D649 Anemia, unspecified: Secondary | ICD-10-CM | POA: Diagnosis present

## 2017-04-06 DIAGNOSIS — K219 Gastro-esophageal reflux disease without esophagitis: Secondary | ICD-10-CM | POA: Diagnosis present

## 2017-04-06 DIAGNOSIS — K117 Disturbances of salivary secretion: Secondary | ICD-10-CM | POA: Diagnosis present

## 2017-04-06 DIAGNOSIS — F329 Major depressive disorder, single episode, unspecified: Secondary | ICD-10-CM | POA: Diagnosis present

## 2017-04-06 DIAGNOSIS — R1314 Dysphagia, pharyngoesophageal phase: Secondary | ICD-10-CM | POA: Diagnosis present

## 2017-04-06 DIAGNOSIS — Z85818 Personal history of malignant neoplasm of other sites of lip, oral cavity, and pharynx: Secondary | ICD-10-CM

## 2017-04-06 DIAGNOSIS — J9601 Acute respiratory failure with hypoxia: Secondary | ICD-10-CM | POA: Diagnosis present

## 2017-04-06 DIAGNOSIS — I10 Essential (primary) hypertension: Secondary | ICD-10-CM | POA: Diagnosis present

## 2017-04-06 DIAGNOSIS — Z79899 Other long term (current) drug therapy: Secondary | ICD-10-CM

## 2017-04-06 DIAGNOSIS — R1310 Dysphagia, unspecified: Secondary | ICD-10-CM

## 2017-04-06 DIAGNOSIS — J189 Pneumonia, unspecified organism: Secondary | ICD-10-CM | POA: Diagnosis not present

## 2017-04-06 DIAGNOSIS — M1990 Unspecified osteoarthritis, unspecified site: Secondary | ICD-10-CM | POA: Diagnosis present

## 2017-04-06 DIAGNOSIS — J9690 Respiratory failure, unspecified, unspecified whether with hypoxia or hypercapnia: Secondary | ICD-10-CM

## 2017-04-06 DIAGNOSIS — E872 Acidosis, unspecified: Secondary | ICD-10-CM

## 2017-04-06 DIAGNOSIS — Z681 Body mass index (BMI) 19 or less, adult: Secondary | ICD-10-CM | POA: Diagnosis not present

## 2017-04-06 DIAGNOSIS — R1319 Other dysphagia: Secondary | ICD-10-CM

## 2017-04-06 DIAGNOSIS — Z8589 Personal history of malignant neoplasm of other organs and systems: Secondary | ICD-10-CM

## 2017-04-06 DIAGNOSIS — Z515 Encounter for palliative care: Secondary | ICD-10-CM | POA: Diagnosis present

## 2017-04-06 DIAGNOSIS — Z923 Personal history of irradiation: Secondary | ICD-10-CM

## 2017-04-06 DIAGNOSIS — Z79891 Long term (current) use of opiate analgesic: Secondary | ICD-10-CM

## 2017-04-06 DIAGNOSIS — F17210 Nicotine dependence, cigarettes, uncomplicated: Secondary | ICD-10-CM | POA: Diagnosis present

## 2017-04-06 DIAGNOSIS — B961 Klebsiella pneumoniae [K. pneumoniae] as the cause of diseases classified elsewhere: Secondary | ICD-10-CM | POA: Diagnosis present

## 2017-04-06 DIAGNOSIS — J449 Chronic obstructive pulmonary disease, unspecified: Secondary | ICD-10-CM | POA: Diagnosis present

## 2017-04-06 DIAGNOSIS — Z9221 Personal history of antineoplastic chemotherapy: Secondary | ICD-10-CM

## 2017-04-06 DIAGNOSIS — J690 Pneumonitis due to inhalation of food and vomit: Secondary | ICD-10-CM | POA: Diagnosis present

## 2017-04-06 DIAGNOSIS — R6521 Severe sepsis with septic shock: Secondary | ICD-10-CM | POA: Diagnosis present

## 2017-04-06 LAB — CBC WITH DIFFERENTIAL/PLATELET
BASOS ABS: 0 10*3/uL (ref 0–0.1)
Basophils Relative: 0 %
Eosinophils Absolute: 0 10*3/uL (ref 0–0.7)
Eosinophils Relative: 0 %
HEMATOCRIT: 42.5 % (ref 40.0–52.0)
HEMOGLOBIN: 14.2 g/dL (ref 13.0–18.0)
LYMPHS ABS: 0.3 10*3/uL — AB (ref 1.0–3.6)
LYMPHS PCT: 6 %
MCH: 29.4 pg (ref 26.0–34.0)
MCHC: 33.4 g/dL (ref 32.0–36.0)
MCV: 87.9 fL (ref 80.0–100.0)
Monocytes Absolute: 0.3 10*3/uL (ref 0.2–1.0)
Monocytes Relative: 5 %
NEUTROS ABS: 5.6 10*3/uL (ref 1.4–6.5)
NEUTROS PCT: 89 %
PLATELETS: 154 10*3/uL (ref 150–440)
RBC: 4.83 MIL/uL (ref 4.40–5.90)
RDW: 16.2 % — ABNORMAL HIGH (ref 11.5–14.5)
WBC: 6.2 10*3/uL (ref 3.8–10.6)

## 2017-04-06 LAB — MRSA PCR SCREENING: MRSA BY PCR: NEGATIVE

## 2017-04-06 LAB — COMPREHENSIVE METABOLIC PANEL
ALBUMIN: 4 g/dL (ref 3.5–5.0)
ALT: 16 U/L — ABNORMAL LOW (ref 17–63)
ANION GAP: 11 (ref 5–15)
AST: 40 U/L (ref 15–41)
Alkaline Phosphatase: 64 U/L (ref 38–126)
BUN: 44 mg/dL — ABNORMAL HIGH (ref 6–20)
CHLORIDE: 100 mmol/L — AB (ref 101–111)
CO2: 28 mmol/L (ref 22–32)
Calcium: 8.8 mg/dL — ABNORMAL LOW (ref 8.9–10.3)
Creatinine, Ser: 1.55 mg/dL — ABNORMAL HIGH (ref 0.61–1.24)
GFR calc non Af Amer: 47 mL/min — ABNORMAL LOW (ref >60)
GFR, EST AFRICAN AMERICAN: 54 mL/min — AB (ref >60)
GLUCOSE: 137 mg/dL — AB (ref 65–99)
Potassium: 4.1 mmol/L (ref 3.5–5.1)
SODIUM: 139 mmol/L (ref 135–145)
Total Bilirubin: 0.8 mg/dL (ref 0.3–1.2)
Total Protein: 7.7 g/dL (ref 6.5–8.1)

## 2017-04-06 LAB — LIPASE, BLOOD: Lipase: 18 U/L (ref 11–51)

## 2017-04-06 LAB — HEMOGLOBIN A1C
Hgb A1c MFr Bld: 6.1 % — ABNORMAL HIGH (ref 4.8–5.6)
Mean Plasma Glucose: 128.37 mg/dL

## 2017-04-06 LAB — BLOOD GAS, ARTERIAL
Acid-base deficit: 0.2 mmol/L (ref 0.0–2.0)
Bicarbonate: 26.4 mmol/L (ref 20.0–28.0)
FIO2: 1
O2 SAT: 91.5 %
PATIENT TEMPERATURE: 37
PO2 ART: 67 mmHg — AB (ref 83.0–108.0)
pCO2 arterial: 50 mmHg — ABNORMAL HIGH (ref 32.0–48.0)
pH, Arterial: 7.33 — ABNORMAL LOW (ref 7.350–7.450)

## 2017-04-06 LAB — LACTIC ACID, PLASMA: Lactic Acid, Venous: 2.8 mmol/L (ref 0.5–1.9)

## 2017-04-06 LAB — MAGNESIUM: MAGNESIUM: 1.8 mg/dL (ref 1.7–2.4)

## 2017-04-06 LAB — GLUCOSE, CAPILLARY: Glucose-Capillary: 184 mg/dL — ABNORMAL HIGH (ref 65–99)

## 2017-04-06 LAB — EXPECTORATED SPUTUM ASSESSMENT W GRAM STAIN, RFLX TO RESP C

## 2017-04-06 LAB — EXPECTORATED SPUTUM ASSESSMENT W REFEX TO RESP CULTURE

## 2017-04-06 LAB — PROCALCITONIN: Procalcitonin: 49.46 ng/mL

## 2017-04-06 LAB — TROPONIN I

## 2017-04-06 LAB — PROTIME-INR
INR: 1.29
Prothrombin Time: 16.2 seconds — ABNORMAL HIGH (ref 11.4–15.2)

## 2017-04-06 MED ORDER — VENLAFAXINE HCL 37.5 MG PO TABS
37.5000 mg | ORAL_TABLET | Freq: Two times a day (BID) | ORAL | Status: DC
  Administered 2017-04-06 – 2017-04-11 (×10): 37.5 mg via ORAL
  Filled 2017-04-06 (×11): qty 1

## 2017-04-06 MED ORDER — SODIUM CHLORIDE 0.9 % IV SOLN
INTRAVENOUS | Status: DC
  Administered 2017-04-06: 11:00:00 via INTRAVENOUS

## 2017-04-06 MED ORDER — FENTANYL 2500MCG IN NS 250ML (10MCG/ML) PREMIX INFUSION
50.0000 ug/h | INTRAVENOUS | Status: DC
  Filled 2017-04-06: qty 250

## 2017-04-06 MED ORDER — PIPERACILLIN-TAZOBACTAM 3.375 G IVPB
3.3750 g | Freq: Three times a day (TID) | INTRAVENOUS | Status: DC
  Administered 2017-04-06 – 2017-04-08 (×6): 3.375 g via INTRAVENOUS
  Filled 2017-04-06 (×6): qty 50

## 2017-04-06 MED ORDER — ACETAMINOPHEN 650 MG RE SUPP: 650.0000 mg | Freq: Four times a day (QID) | RECTAL | Status: DC | PRN

## 2017-04-06 MED ORDER — ONDANSETRON HCL 4 MG PO TABS: 4.0000 mg | ORAL_TABLET | Freq: Four times a day (QID) | ORAL | Status: DC | PRN

## 2017-04-06 MED ORDER — FLUTICASONE PROPIONATE 50 MCG/ACT NA SUSP
1.0000 | Freq: Every day | NASAL | Status: DC
  Administered 2017-04-06 – 2017-04-11 (×5): 1 via NASAL
  Filled 2017-04-06: qty 16

## 2017-04-06 MED ORDER — PANTOPRAZOLE SODIUM 40 MG PO TBEC
40.0000 mg | DELAYED_RELEASE_TABLET | Freq: Every day | ORAL | Status: DC
  Administered 2017-04-06 – 2017-04-10 (×5): 40 mg via ORAL
  Filled 2017-04-06 (×5): qty 1

## 2017-04-06 MED ORDER — METHYLPREDNISOLONE SODIUM SUCC 125 MG IJ SOLR
125.0000 mg | Freq: Once | INTRAMUSCULAR | Status: AC
  Administered 2017-04-06: 125 mg via INTRAVENOUS
  Filled 2017-04-06: qty 2

## 2017-04-06 MED ORDER — ONDANSETRON HCL 4 MG/2ML IJ SOLN: 4.0000 mg | Freq: Four times a day (QID) | INTRAMUSCULAR | Status: DC | PRN

## 2017-04-06 MED ORDER — ENOXAPARIN SODIUM 40 MG/0.4ML ~~LOC~~ SOLN
40.0000 mg | SUBCUTANEOUS | Status: DC
  Administered 2017-04-06 – 2017-04-11 (×6): 40 mg via SUBCUTANEOUS
  Filled 2017-04-06 (×6): qty 0.4

## 2017-04-06 MED ORDER — CLONAZEPAM 0.5 MG PO TABS
1.0000 mg | ORAL_TABLET | Freq: Two times a day (BID) | ORAL | Status: DC
  Administered 2017-04-06 (×2): 1 mg via ORAL
  Filled 2017-04-06 (×2): qty 2

## 2017-04-06 MED ORDER — SODIUM CHLORIDE 0.9 % IV SOLN
50.0000 ug/h | INTRAVENOUS | Status: DC
  Filled 2017-04-06: qty 50

## 2017-04-06 MED ORDER — ARIPIPRAZOLE 2 MG PO TABS
2.0000 mg | ORAL_TABLET | Freq: Every day | ORAL | Status: DC
  Administered 2017-04-06 – 2017-04-11 (×6): 2 mg via ORAL
  Filled 2017-04-06 (×7): qty 1

## 2017-04-06 MED ORDER — PROPOFOL 1000 MG/100ML IV EMUL
5.0000 ug/kg/min | Freq: Once | INTRAVENOUS | Status: DC
  Filled 2017-04-06: qty 100

## 2017-04-06 MED ORDER — SUCCINYLCHOLINE CHLORIDE 20 MG/ML IJ SOLN: 120.0000 mg | Freq: Once | INTRAMUSCULAR | Status: DC

## 2017-04-06 MED ORDER — NOREPINEPHRINE BITARTRATE 1 MG/ML IV SOLN
0.0000 ug/min | INTRAVENOUS | Status: DC
  Filled 2017-04-06: qty 4

## 2017-04-06 MED ORDER — VANCOMYCIN HCL IN DEXTROSE 1-5 GM/200ML-% IV SOLN
1000.0000 mg | Freq: Once | INTRAVENOUS | Status: AC
  Administered 2017-04-06: 1000 mg via INTRAVENOUS
  Filled 2017-04-06: qty 200

## 2017-04-06 MED ORDER — SODIUM CHLORIDE 0.9 % IV BOLUS (SEPSIS)
30.0000 mL/kg | Freq: Once | INTRAVENOUS | Status: AC
  Administered 2017-04-06: 1000 mL via INTRAVENOUS

## 2017-04-06 MED ORDER — KETAMINE HCL 10 MG/ML IJ SOLN
120.0000 mg | Freq: Once | INTRAMUSCULAR | Status: DC
  Filled 2017-04-06: qty 1

## 2017-04-06 MED ORDER — DULOXETINE HCL 30 MG PO CPEP
60.0000 mg | ORAL_CAPSULE | Freq: Every day | ORAL | Status: DC
  Administered 2017-04-06 – 2017-04-11 (×6): 60 mg via ORAL
  Filled 2017-04-06: qty 1
  Filled 2017-04-06: qty 2
  Filled 2017-04-06 (×2): qty 1
  Filled 2017-04-06 (×2): qty 2
  Filled 2017-04-06: qty 1
  Filled 2017-04-06 (×3): qty 2

## 2017-04-06 MED ORDER — ACETAMINOPHEN 325 MG PO TABS: 650.0000 mg | ORAL_TABLET | Freq: Four times a day (QID) | ORAL | Status: DC | PRN

## 2017-04-06 MED ORDER — ORAL CARE MOUTH RINSE
15.0000 mL | Freq: Two times a day (BID) | OROMUCOSAL | Status: DC
  Administered 2017-04-06 – 2017-04-10 (×6): 15 mL via OROMUCOSAL

## 2017-04-06 MED ORDER — METHYLPREDNISOLONE SODIUM SUCC 125 MG IJ SOLR: 60.0000 mg | INTRAMUSCULAR | Status: DC

## 2017-04-06 MED ORDER — BUDESONIDE 0.25 MG/2ML IN SUSP
0.2500 mg | Freq: Two times a day (BID) | RESPIRATORY_TRACT | Status: DC
Start: 2017-04-06 — End: 2017-04-11
  Administered 2017-04-06 – 2017-04-11 (×11): 0.25 mg via RESPIRATORY_TRACT
  Filled 2017-04-06 (×11): qty 2

## 2017-04-06 MED ORDER — ADULT MULTIVITAMIN W/MINERALS CH
1.0000 | ORAL_TABLET | Freq: Every day | ORAL | Status: DC
  Administered 2017-04-07 – 2017-04-11 (×5): 1 via ORAL
  Filled 2017-04-06 (×5): qty 1

## 2017-04-06 MED ORDER — TRAZODONE HCL 50 MG PO TABS
150.0000 mg | ORAL_TABLET | Freq: Every day | ORAL | Status: DC
  Administered 2017-04-06 – 2017-04-10 (×5): 150 mg via ORAL
  Filled 2017-04-06: qty 1
  Filled 2017-04-06 (×2): qty 3
  Filled 2017-04-06 (×2): qty 1

## 2017-04-06 MED ORDER — PIPERACILLIN-TAZOBACTAM 3.375 G IVPB 30 MIN
3.3750 g | Freq: Once | INTRAVENOUS | Status: AC
  Administered 2017-04-06: 3.375 g via INTRAVENOUS
  Filled 2017-04-06: qty 50

## 2017-04-06 MED ORDER — ALBUTEROL SULFATE (2.5 MG/3ML) 0.083% IN NEBU
2.5000 mg | INHALATION_SOLUTION | RESPIRATORY_TRACT | Status: DC
  Administered 2017-04-06 – 2017-04-08 (×13): 2.5 mg via RESPIRATORY_TRACT
  Filled 2017-04-06 (×13): qty 3

## 2017-04-06 MED ORDER — OXYCODONE HCL 5 MG PO TABS
20.0000 mg | ORAL_TABLET | Freq: Four times a day (QID) | ORAL | Status: DC
  Administered 2017-04-06 (×4): 20 mg via ORAL
  Filled 2017-04-06 (×4): qty 4

## 2017-04-06 MED ORDER — NOREPINEPHRINE BITARTRATE 1 MG/ML IV SOLN
0.0000 ug/min | Freq: Once | INTRAVENOUS | Status: AC
  Administered 2017-04-06: 5 ug/min via INTRAVENOUS
  Administered 2017-04-06: 10 ug/min via INTRAVENOUS
  Filled 2017-04-06: qty 4

## 2017-04-06 NOTE — Evaluation (Signed)
Clinical/Bedside Swallow Evaluation Patient Details  Name: Mark [name] MRN: [id number] Date of Birth: December 14, 1955  Today's Date: 04/06/2017 Time: SLP Start Time (ACUTE ONLY): 1015 SLP Stop Time (ACUTE ONLY): 1100 SLP Time Calculation (min) (ACUTE ONLY): 45 min  Past Medical History:  Past Medical History:  Diagnosis Date  . Arthritis    left hand  . Aspiration pneumonia ([place])   . Cancer ([place])    tonsilal ca  . COPD (chronic obstructive pulmonary disease) ([place])   . Incontinence    night time, began recently  . Memory deficit 11/18/2014  . PEG tube 11/18/2014   removed summer 2017  . Peripheral neuropathy 11/18/2014  . Smokers' cough ([place])   . Tonsillar cancer, S/P surgery, chemotherapy XRT 2009-followed at [facility] 11/18/2014  . Wears dentures    full upper and lower   Past Surgical History:  Past Surgical History:  Procedure Laterality Date  . APPENDECTOMY    . CHOLECYSTECTOMY  2007  . ESOPHAGEAL DILATION N/A 08/01/2016   Procedure: ESOPHAGEAL DILATION;  Surgeon: [name], MD;  Location: [place];  Service: Endoscopy;  Laterality: N/A;  . ESOPHAGEAL DILATION N/A 02/19/2017   Procedure: ESOPHAGEAL DILATION;  Surgeon: [name], MD;  Location: [place];  Service: Endoscopy;  Laterality: N/A;  . ESOPHAGOGASTRODUODENOSCOPY (EGD) WITH PROPOFOL N/A 01/24/2016   Procedure: ESOPHAGOGASTRODUODENOSCOPY (EGD) WITH gastric biopsy and esophageal dilation.;  Surgeon: [name], MD;  Location: [place];  Service: Endoscopy;  Laterality: N/A;  . ESOPHAGOGASTRODUODENOSCOPY (EGD) WITH PROPOFOL N/A 08/01/2016   Procedure: ESOPHAGOGASTRODUODENOSCOPY (EGD) WITH PROPOFOL;  Surgeon: [name], MD;  Location: [place];  Service: Endoscopy;  Laterality: N/A;  . ESOPHAGOGASTRODUODENOSCOPY (EGD) WITH PROPOFOL N/A 02/19/2017   Procedure: ESOPHAGOGASTRODUODENOSCOPY (EGD) WITH PROPOFOL;  Surgeon: [name], MD;  Location: [place];  Service:  Endoscopy;  Laterality: N/A;  . PEG PLACEMENT N/A 11/28/2015   Procedure: PERCUTANEOUS ENDOSCOPIC GASTROSTOMY (PEG) PLACEMENT;  Surgeon: [name], MD;  Location: [place];  Service: Endoscopy;  Laterality: N/A;  . PEG TUBE PLACEMENT  10/22/12   10/22/12 - [facility], 10/23/14 - [facility], Dr. Allen Norris, replaced  . TONSILLECTOMY     jan 2010--stage IV   HPI:  61 year old male admitted 04/06/17 with shortness of breath and fatigue. 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 Pt removed upper and lower dentures, then performed oral care with min assist. SLP cleaned dentures and pt replaced them. Pt was given trials of thin liquid, nectar thick liquid, puree, and solid consistencies. Immediate weak cough response was noted after thin liquid trial, raising concern for airway compromise. Pt did not exhibit overt s/s aspiration on nectar thick, puree, or solid trials, however, O2 sats were noted to drop into the low to mid 80's periodically throughout this evaluation. Pt has a long standing history of dysphagia with significant aspiration risk, ~15 radiation treatments for head/neck cancer ending in 2010, and PEG placement (now removed as of 8 months ago). In addition, pt has esophageal issues requiring dilation 3x. Pt's wife reports he has difficulty with bread and meats due to esophageal dysmotility. CXR indicates multifocal bilateral pulmonary infiltrates consistent with PNA, and COPD (which increases risk for silent aspiration). Pt and wife indicate their desire to continue po intake with known high aspiration risk, so will recommend conservative  diet of Dys 2 (finely chopped) solids and nectar thick liquids, meds whole in puree. The option of repeating MBS was discussed with pt/wife. Completion of this study  would provide information regarding safe and unsafe consistencies, however, MBS may likely reveal aspiration of all consistencies and result in recommendation for NPO. Will begin po diet at this time and follow up for pt/wife decisions regarding MBS. Given that MBS results will not change course of treatment (PEG tube replacement is not desired), completion of this test is not paramount except to identify least restrictive diet and compensatory techniques/positions.    SLP Visit Diagnosis: Dysphagia, pharyngoesophageal phase (R13.14)    Aspiration Risk  Risk for inadequate nutrition/hydration;Severe aspiration risk    Diet Recommendation Nectar-thick liquid;Dysphagia 2 (Fine chop)   Liquid Administration via: Cup Medication Administration: Whole meds with puree Supervision: Patient able to self feed;Full supervision/cueing for compensatory strategies;Staff to assist with self feeding Compensations: Minimize environmental distractions;Slow rate;Small sips/bites Postural Changes: Remain upright for at least 30 minutes after po intake;Seated upright at 90 degrees    Other  Recommendations Oral Care Recommendations: Oral care before and after PO   Follow up Recommendations  (to be determined)      Frequency and Duration min 1 x/week  2 weeks       Prognosis Prognosis for Safe Diet Advancement: Guarded Barriers to Reach Goals: Severity of deficits;Time post onset      Swallow Study   General Date of Onset: 04/06/17 HPI: 61 year old male admitted 04/06/17 with shortness of breath and fatigue. 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), dysphagia, COPD, memory difficulty, PEG (removed 8 months ago) Type of Study: Bedside Swallow Evaluation Previous Swallow Assessment: none Diet Prior to this Study: NPO Respiratory Status:  (High Flow Nasal Cannula) History of Recent Intubation: No Behavior/Cognition: Alert;Cooperative;Pleasant mood Oral  Cavity Assessment: Within Functional Limits Oral Care Completed by SLP: Yes Oral Cavity - Dentition: Dentures, top;Dentures, bottom Vision: Functional for self-feeding Self-Feeding Abilities: Able to feed self Patient Positioning: Upright in bed Baseline Vocal Quality: Low vocal intensity;Hoarse Volitional Cough: Weak Volitional Swallow: Able to elicit    Oral/Motor/Sensory Function Overall Oral Motor/Sensory Function: Within functional limits   Ice Chips Ice chips: Within functional limits Presentation: Spoon   Thin Liquid Thin Liquid: Impaired Pharyngeal  Phase Impairments: Suspected delayed Swallow;Cough - Immediate;Change in Vital Signs;Multiple swallows    Nectar Thick Nectar Thick Liquid: Within functional limits Presentation: Cup;Straw;Self Fed   Honey Thick Honey Thick Liquid: Not tested   Puree Puree: Within functional limits Presentation: Spoon;Self Fed   Solid   GO   Solid: Within functional limits Presentation: [place] B. Quentin [place], [facility], CCC-SLP Speech Pathologist [phone]  Shonna Chock 04/06/2017,11:55 AM

## 2017-04-06 NOTE — ED Notes (Signed)
Pt c/io increased SOB since yesterday with brown sputum. Pt has a Hx of tonsillar CA with feeding tube with aspiration.. Pt no longer has the feeding tube but has issues with aspiration since. Pt is a/ox3 on arrival to room and a NRB applied.Marland Kitchen

## 2017-04-06 NOTE — H&P (Signed)
[facility] NAME: Mark [name]    MR#:  [id number]  DATE OF BIRTH:  August 05, 1956  DATE OF ADMISSION:  04/06/2017  PRIMARY CARE PHYSICIAN: Secundino Ginger, PA-C   REQUESTING/REFERRING PHYSICIAN:   CHIEF COMPLAINT:   Chief Complaint  Patient presents with  . Shortness of Breath    HISTORY OF PRESENT ILLNESS: Mark [name]  is a 61 y.o. 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 Patient's wife admits of dysphagia symptoms, coughing, choking on the food intermittently. On arrival to the hospital, patient's O2 sats was 73% on room air, his blood pressure was 60, he was given IV fluids, placed on nonrebreather and he is somewhat improved. His lactic acid level was also found to be elevated at 2.8. Chest x-ray revealed multifocal pneumonia. Hospitalist services were contacted for admission.   PAST MEDICAL HISTORY:   Past Medical History:  Diagnosis Date  . Arthritis    left hand  . Aspiration pneumonia ([place])   . Cancer ([place])    tonsilal ca  . COPD (chronic obstructive pulmonary disease) ([place])   . Incontinence    night time, began recently  . Memory deficit 11/18/2014  . PEG tube 11/18/2014   removed summer 2017  . Peripheral neuropathy 11/18/2014  . Smokers' cough ([place])   . Tonsillar cancer, S/P surgery, chemotherapy XRT 2009-followed at [facility] 11/18/2014  . Wears dentures    full upper and lower    PAST SURGICAL HISTORY: Past Surgical History:  Procedure Laterality Date  .  APPENDECTOMY    . CHOLECYSTECTOMY  2007  . ESOPHAGEAL DILATION N/A 08/01/2016   Procedure: ESOPHAGEAL DILATION;  Surgeon: [name], MD;  Location: [place];  Service: Endoscopy;  Laterality: N/A;  . ESOPHAGEAL DILATION N/A 02/19/2017   Procedure: ESOPHAGEAL DILATION;  Surgeon: [name], MD;  Location: [place];  Service: Endoscopy;  Laterality: N/A;  . ESOPHAGOGASTRODUODENOSCOPY (EGD) WITH PROPOFOL N/A 01/24/2016   Procedure: ESOPHAGOGASTRODUODENOSCOPY (EGD) WITH gastric biopsy and esophageal dilation.;  Surgeon: [name], MD;  Location: [place];  Service: Endoscopy;  Laterality: N/A;  . ESOPHAGOGASTRODUODENOSCOPY (EGD) WITH PROPOFOL N/A 08/01/2016   Procedure: ESOPHAGOGASTRODUODENOSCOPY (EGD) WITH PROPOFOL;  Surgeon: [name], MD;  Location: [facility];  Service: Endoscopy;  Laterality: N/A;  . ESOPHAGOGASTRODUODENOSCOPY (EGD) WITH PROPOFOL N/A 02/19/2017   Procedure: ESOPHAGOGASTRODUODENOSCOPY (EGD) WITH PROPOFOL;  Surgeon: [name], MD;  Location: [place];  Service: Endoscopy;  Laterality: N/A;  . PEG PLACEMENT N/A 11/28/2015   Procedure: PERCUTANEOUS ENDOSCOPIC GASTROSTOMY (PEG) PLACEMENT;  Surgeon: [name], MD;  Location: [place];  Service: Endoscopy;  Laterality: N/A;  . PEG TUBE PLACEMENT  10/22/12   10/22/12 - [facility], 10/23/14 - [facility], Dr. Allen Norris, replaced  . TONSILLECTOMY     jan 2010--stage IV    SOCIAL HISTORY:  Social History  Substance Use Topics  . Smoking status: Current Every Day Smoker    Years: 40.00  . Smokeless tobacco: Never Used     Comment: was 2 PPD,  now 2 cigs/day  . Alcohol use No    FAMILY HISTORY:  Family History  Problem Relation Age of Onset  . Liver cancer Mother   . Stroke Father   . Testicular cancer Father     DRUG ALLERGIES:  Allergies  Allergen Reactions  . Morphine And Related Nausea And Vomiting    Review of Systems  Constitutional: Positive for chills,  malaise/fatigue and weight loss. Negative for fever.  HENT: Positive for congestion.   Eyes: Positive for blurred vision. Negative for double vision.  Respiratory: Positive for cough, sputum production, shortness of breath and wheezing.   Cardiovascular: Positive for chest pain and orthopnea. Negative for palpitations, leg swelling and PND.  Gastrointestinal: Positive for constipation. Negative for abdominal pain, blood in stool, diarrhea, nausea and vomiting.  Genitourinary: Positive for flank pain. Negative for dysuria, frequency, hematuria and urgency.  Musculoskeletal: Positive for myalgias. Negative for falls.  Neurological: Negative for dizziness, tremors, focal weakness and headaches.  Endo/Heme/Allergies: Does not bruise/bleed easily.  Psychiatric/Behavioral: Negative for depression. The patient does not have insomnia.     MEDICATIONS AT HOME:  Prior to Admission medications   Medication Sig Start Date End Date Taking? Authorizing Provider  ARIPiprazole (ABILIFY) 2 MG tablet Take 2 mg by mouth daily.    Yes [provider]  clonazePAM (KLONOPIN) 1 MG tablet Take 1 mg by mouth 2 (two) times daily.    Yes [provider]  DULoxetine (CYMBALTA) 60 MG capsule Take 60 mg by mouth daily.   Yes [provider]  Ipratropium-Albuterol (COMBIVENT) 20-100 MCG/ACT AERS respimat Inhale 2 puffs into the lungs 2 (two) times daily.   Yes [provider]  mometasone (NASONEX) 50 MCG/ACT nasal spray Place 2 sprays into both nostrils 2 (two) times daily.   Yes [provider]  Oxycodone HCl 20 MG TABS Take 20 mg by mouth 4 (four) times daily.    Yes [provider]  pantoprazole (PROTONIX) 40 MG tablet Take 40 mg by mouth at bedtime.    Yes [provider]  traZODone (DESYREL) 150 MG tablet Take 150 mg by mouth at bedtime.    Yes [provider]  venlafaxine (EFFEXOR) 37.5 MG tablet Take 37.5 mg by mouth 2 (two) times daily with a  meal.    Yes [provider]  albuterol (PROVENTIL HFA;VENTOLIN HFA) 108 (90 BASE) MCG/ACT inhaler Inhale 2 puffs into the lungs every 6 (six) hours as needed for wheezing or shortness of breath.     [provider]  Water For Irrigation, Sterile (FREE WATER) SOLN Place 200 mLs into feeding tube every 8 (eight) hours. Patient not taking: Reported on 04/06/2017 04/25/15   Fritzi Mandes, MD      PHYSICAL EXAMINATION:   VITAL SIGNS: Blood pressure 96/60, pulse (!) 122, temperature 98.1 F (36.7 C), temperature source Oral, resp. rate (!) 32, height 6' (1.829 m), weight 63.5 kg (140 lb), SpO2 96 %.  GENERAL:  61 y.o.-year-old patient lying in the bed In moderate to severe respiratory distress, tachypneic, somewhat uncomfortable, using nonrebreather.  EYES: Pupils equal, round, reactive to light and accommodation. No scleral icterus. Extraocular muscles intact.  HEENT: Head atraumatic, normocephalic. Oropharynx and nasopharynx clear.  NECK:  Supple, no jugular venous distention. No thyroid enlargement, no tenderness.  LUNGS: Diminished breath sounds on the right, no wheezing, but right-sided rales,rhonchi and crepitations noted, some crepitations on the left, but is relatively good air entrance of the left. Intermittent use of accessory muscles  of respiration.  CARDIOVASCULAR: S1, S2 , tachycardic. No murmurs, rubs, or gallops.  ABDOMEN: Soft, nontender, nondistended. Bowel sounds present. No organomegaly or mass.  EXTREMITIES: No pedal edema, cyanosis, or clubbing.  NEUROLOGIC: Cranial nerves II through XII are intact. Muscle strength 5/5 in all extremities. Sensation intact. Gait not checked.  PSYCHIATRIC: The patient is alert and oriented x 3.  SKIN: No obvious rash, lesion, or ulcer.   LABORATORY PANEL:   CBC  Recent Labs Lab 04/06/17 0731  WBC 6.2  HGB 14.2  HCT 42.5  PLT 154  MCV 87.9  MCH 29.4  MCHC 33.4  RDW 16.2*  LYMPHSABS 0.3*  MONOABS 0.3  EOSABS 0.0    BASOSABS 0.0   ------------------------------------------------------------------------------------------------------------------  Chemistries   Recent Labs Lab 04/06/17 0731  NA 139  K 4.1  CL 100*  CO2 28  GLUCOSE 137*  BUN 44*  CREATININE 1.55*  CALCIUM 8.8*  AST 40  ALT 16*  ALKPHOS 64  BILITOT 0.8   ------------------------------------------------------------------------------------------------------------------  Cardiac Enzymes  Recent Labs Lab 04/06/17 0731  TROPONINI <0.03   ------------------------------------------------------------------------------------------------------------------  RADIOLOGY: Dg Chest Port 1 View  Result Date: 04/06/2017 CLINICAL DATA:  Shortness of breath.  Confusion. EXAM: PORTABLE CHEST 1 VIEW COMPARISON:  02/15/2016 . FINDINGS: Mediastinum hilar structures are normal. Multifocal bilateral pulmonary infiltrates are noted. Findings consist with pneumonia. Follow-up exam suggested to demonstrate clearing and to exclude underlying mass lesions. COPD . Mild left base pleural thickening again noted consistent scarring. No pneumothorax. No acute bony abnormality. IMPRESSION: 1. Multifocal bilateral pulmonary infiltrates consistent with pneumonia. Follow-up exams demonstrate clearing suggested. 2. COPD. Electronically Signed   By: Marcello Moores  Register   On: 04/06/2017 07:47    EKG: Orders placed or performed during the hospital encounter of 04/06/17  . ED EKG 12-Lead  . ED EKG 12-Lead   [place] emergency room reveals sinus tachycardia at a rate of 141 bpm, nonspecific repolarization abnormalities in diffuse leads, prolonged QTC to 517 ms   IMPRESSION AND PLAN:  Active Problems:   Severe sepsis with septic shock (HCC)   Multifocal pneumonia   Lactic acidosis   Acute respiratory failure with hypoxia ([place])   Septic shock ([place])  #1. Severe sepsis with septic shock, admit patient to intensive care unit, continue IV fluids at high rate,  Levophed if needed to keep MAP at 65 and above, get intensivist consultation #2. Acute respiratory failure with hypoxia, continue oxygen therapy as needed, keep her pulse oximeter is 94% and above, a pulmonologist consultation is obtained #3. Multifocal pneumonia, concerning for aspiration pneumonitis cannot rule out the healthcare associated pneumonia, since patient is immunocompromised, initiated him on broad-spectrum antibiotic therapy with Zosyn and vancomycin, and MRSA PCR, get sputum cultures #4. Lactic acidosis, continue IV fluids, following lactic acid level closely #5. Dysphagia, get speech therapist evaluation. Keep patient nothing by mouth #6. Acute kidney injury, continue IV fluids, following creatinine in the morning,get urinalysis  All the records are reviewed and case discussed with ED provider. Management plans discussed with the patient, family and they are in agreement.  CODE STATUS: Code Status History    Date Active Date Inactive Code Status Order ID Comments User Context   04/23/2015  1:01 PM 04/25/2015  5:57 PM Full Code [id number]  Loletha Grayer, MD ED   11/18/2014  5:52 PM 11/21/2014  3:04 PM Full Code [id number]  Nita Sells, MD Inpatient       TOTAL TIME TAKING CARE OF THIS PATIENT: 60 minutes.  Theodoro Grist M.D on 04/06/2017 at 8:39 AM  Between 7am to 6pm - Pager - [phone] After 6pm go to [email or web address] - password [email or web address] [facility]  Office  [phone]  CC: Primary care physician; Secundino Ginger, PA-C

## 2017-04-06 NOTE — Consult Note (Signed)
PULMONARY / CRITICAL CARE MEDICINE   Name: Mark [name] MRN: [id number] DOB: 12-Nov-1955    ADMISSION DATE:  04/06/2017  HISTORY OF PRESENT ILLNESS:   47 M with history of squamous cell carcinoma of head and neck, status post surgery/chemotherapy/XRT 2009. He is left with severe dysphagia. He previously had a G-tube but had this removed. He has had recurrent aspiration pneumonia. He presented to the ED with acute hypoxemic respiratory failure and bilateral pulmonary infiltrates consistent with multifocal pneumonia. He was initially hypotensive but this improved with normal saline boluses. He is admitted to ICU/SDU for HFNC  PAST MEDICAL HISTORY :  He  has a past medical history of Arthritis; Aspiration pneumonia ([place]); Cancer [facility]); COPD (chronic obstructive pulmonary disease) ([place]); Incontinence; Memory deficit (11/18/2014); PEG tube (11/18/2014); Peripheral neuropathy (11/18/2014); Smokers' cough ([place]); Tonsillar cancer, S/P surgery, chemotherapy XRT 2009-followed at [place] (11/18/2014); and Wears dentures.  PAST SURGICAL HISTORY: He  has a past surgical history that includes Appendectomy; Tonsillectomy; PEG tube placement (10/22/12); PEG placement (N/A, 11/28/2015); Esophagogastroduodenoscopy (egd) with propofol (N/A, 01/24/2016); Cholecystectomy (2007); Esophagogastroduodenoscopy (egd) with propofol (N/A, 08/01/2016); Esophageal dilation (N/A, 08/01/2016); Esophagogastroduodenoscopy (egd) with propofol (N/A, 02/19/2017); and Esophageal dilation (N/A, 02/19/2017).  Allergies  Allergen Reactions  . Morphine And Related Nausea And Vomiting    No current facility-administered medications on file prior to encounter.    Current Outpatient Prescriptions on File Prior to Encounter  Medication Sig  . ARIPiprazole (ABILIFY) 2 MG tablet Take 2 mg by mouth daily.   . clonazePAM (KLONOPIN) 1 MG tablet Take 1 mg by mouth 2 (two) times daily.   . DULoxetine (CYMBALTA) 60 MG capsule Take 60 mg by mouth daily.  .  Ipratropium-Albuterol (COMBIVENT) 20-100 MCG/ACT AERS respimat Inhale 2 puffs into the lungs 2 (two) times daily.  . mometasone (NASONEX) 50 MCG/ACT nasal spray Place 2 sprays into both nostrils 2 (two) times daily.  . Oxycodone HCl 20 MG TABS Take 20 mg by mouth 4 (four) times daily.   . pantoprazole (PROTONIX) 40 MG tablet Take 40 mg by mouth at bedtime.   . traZODone (DESYREL) 150 MG tablet Take 150 mg by mouth at bedtime.   Marland Kitchen venlafaxine (EFFEXOR) 37.5 MG tablet Take 37.5 mg by mouth 2 (two) times daily with a meal.   . albuterol (PROVENTIL HFA;VENTOLIN HFA) 108 (90 BASE) MCG/ACT inhaler Inhale 2 puffs into the lungs every 6 (six) hours as needed for wheezing or shortness of breath.   . Water For Irrigation, Sterile (FREE WATER) SOLN Place 200 mLs into feeding tube every 8 (eight) hours. (Patient not taking: Reported on 04/06/2017)    FAMILY HISTORY:  His indicated that the status of his mother is unknown. He indicated that the status of his father is unknown.    SOCIAL HISTORY: He  reports that he has been smoking.  He has smoked for the past 40.00 years. He has never used smokeless tobacco. He reports that he does not drink alcohol or use drugs.  REVIEW OF SYSTEMS:   Positive for subjective fevers, weight loss, generalized fatigue. No chest pain, hemoptysis, N/V/D, diarrhea, dysuria  SUBJECTIVE:    VITAL SIGNS: BP 91/63   Pulse (!) 106   Temp 97.7 F (36.5 C) (Oral)   Resp 20   Ht 6' (1.829 m)   Wt 140 lb (63.5 kg)   SpO2 93%   BMI 18.99 kg/m   HEMODYNAMICS:    VENTILATOR SETTINGS: FiO2 (%):  [60 %-85 %] 60 %  INTAKE /  OUTPUT: No intake/output data recorded.  PHYSICAL EXAMINATION: General: Thin, no distress, cognition intact Neuro: CNs intact, no focal deficits, DTRs symmetric HEENT: NCAT, sclerae white Cardiovascular: Regular, no M Lungs: Diminished breath sounds, scattered rhonchi Abdomen: Soft, NT, NABS Ext: no edema  LABS:  BMET  Recent Labs Lab  04/06/17 0731  NA 139  K 4.1  CL 100*  CO2 28  BUN 44*  CREATININE 1.55*  GLUCOSE 137*    Electrolytes  Recent Labs Lab 04/06/17 0731  CALCIUM 8.8*  MG 1.8    CBC  Recent Labs Lab 04/06/17 0731  WBC 6.2  HGB 14.2  HCT 42.5  PLT 154    Coag's  Recent Labs Lab 04/06/17 0731  INR 1.29    Sepsis Markers  Recent Labs Lab 04/06/17 0731 04/06/17 1058  LATICACIDVEN 2.8* 2.5*  PROCALCITON 49.46  --     ABG  Recent Labs Lab 04/06/17 0821  PHART 7.33*  PCO2ART 50*  PO2ART 67*    Liver Enzymes  Recent Labs Lab 04/06/17 0731  AST 40  ALT 16*  ALKPHOS 64  BILITOT 0.8  ALBUMIN 4.0    Cardiac Enzymes  Recent Labs Lab 04/06/17 0731 04/06/17 1058  TROPONINI <0.03 <0.03    Glucose  Recent Labs Lab 04/06/17 0943  GLUCAP 184*    Imaging Dg Chest Port 1 View  Result Date: 04/06/2017 CLINICAL DATA:  Shortness of breath.  Confusion. EXAM: PORTABLE CHEST 1 VIEW COMPARISON:  02/15/2016 . FINDINGS: Mediastinum hilar structures are normal. Multifocal bilateral pulmonary infiltrates are noted. Findings consist with pneumonia. Follow-up exam suggested to demonstrate clearing and to exclude underlying mass lesions. COPD . Mild left base pleural thickening again noted consistent scarring. No pneumothorax. No acute bony abnormality. IMPRESSION: 1. Multifocal bilateral pulmonary infiltrates consistent with pneumonia. Follow-up exams demonstrate clearing suggested. 2. COPD. Electronically Signed   By: Marcello Moores  Register   On: 04/06/2017 07:47    ASSESSMENT / PLAN: Acute hypoxemic respiratory failure Aspiration pneumonia AKI Severe sepsis  Continue supplemental oxygen to maintain SPO2 >90% Continue piperacillin-tazobactam IVFs adjusted Monitor BMET intermittently Monitor I/Os Correct electrolytes as indicated    Merton Border, MD PCCM service Mobile [phone] Pager [phone] 04/06/2017 6:41 PM  04/06/2017, 6:11 PM

## 2017-04-06 NOTE — Progress Notes (Signed)
Initial Nutrition Assessment  DOCUMENTATION CODES:   Severe malnutrition in context of chronic illness  INTERVENTION:  Provide Magic cup TID with meals, each supplement provides 290 kcal and 9 grams of protein.  Provide Mighty Shake II BID with lunch and dinner, each supplement provides 480-500 kcals and 20-23 grams of protein.  Provide daily multivitamin with minerals.  As patient is now on dysphagia 2 diet with nectar-thick liquids, meat will be chopped fine and served moist. Encouraged patient to add all food groups back into his diet now that they will be appropriate consistency. Encouraged intake of balanced meals and adequate intake of calories and protein to prevent further loss of body weight/lean body mass.  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  GOAL:   Patient will meet greater than or equal to 90% of their needs  MONITOR:   PO intake, Supplement acceptance, Labs, Weight trends, I & O's  REASON FOR ASSESSMENT:   Consult  (NPO due to dysphagia)  ASSESSMENT:   61 year old male with PMHx of tonsillar cancer s/p tonsillectomy and XRT in 2009, hx of aspiration PNA, hx PEG tube (10/22/2012-05/2016), gastrocutaneous fistula following PEG tube removal, peripheral neuropathy, memory deficit, COPD who presented with SOB, pain in ribs and right flank, and weight loss found to have severe sepsis with septic shock, PNA, lactic acidosis, AKI.   -Following SLP consult diet was advanced to dysphagia 2 with nectar-thick liquids. -Of note pt recently underwent EGD by Dr. Allen Norris in setting of his dysphagia. Found one moderate benign-appearing, intrinsic stenosis at LES, which was dilated to 16.5  mm. Also found one moderate benign-appearing, intrinsic stenosis in upper third of esophagus, which was dilated to 16.5 mm. Plan was for repeat EGD after 6 weeks for re-treatment.  Spoke with patient at bedside. He reports his appetite has been "pretty good" PTA. He eats 2-3 meals per day prepared by wife. However, patient is mainly consuming vegetables at meals. He is unable to take in any meat due to dysphagia. His only source of protein is occasional milk (reports he is not having any legumes, either). He has not been on any special diet related to his dysphagia. He does endorse his esophagus was dilated recently, which helped some, but he was due for another dilation soon. He only occasionally drinks oral nutrition supplements. When discussing patient's hx of PEG tube, he got somewhat confused. He reported it was placed 4 years ago, but then kept reporting he had the tube for a total of 8 yrs. He was unable to report when it had been removed. Able to obtain information from chart. Patient is amenable to trying oral nutrition supplements that align with diet recommended by SLP.  Patient reports his UBW was 198 lbs prior to initiating treatment. Did not see a weight this high in chart. In 2013 patient was approximately 175 lbs, and in 2016 patient was approximately 155 lbs. Unsure of accuracy of current body weight. Patient was 153 lbs on 04/21/2016 and was 133 lbs on 02/19/2017. That is a weight loss of 20 lbs (13.1% body weight) over 10 months, which is not significant for time frame.  Medications reviewed and include: clonazepam, pantoprazole, Levophed gtt, Zosyn.  Labs reviewed: CBG 184, Lactic Acid 2.5, HgbA1c  6.1, Chloride 100, BUN 44, Creatinine 1.55.  Nutrition-Focused physical exam completed. Findings are moderate-severe fat depletion (moderate depletion of thoracic/lumbar region; severe depletion of orbital region and upper arm region), moderate-severe muscle depletion (moderate depletion of  clavicle and clavicle/acromion bone region, scapular bone region, dorsal hand region; severe depletion of temple region, patellar region, anterior thigh region, posterior calf region), and no edema. Patient with full upper and lower dentures.  Discussed with SLP and RN. Patient was discussed on rounds. He does not want another G-tube placed, even in setting of dysphagia. He is amenable to following dysphagia diet if need be.  Diet Order:  DIET DYS 2 Room service appropriate? Yes; Fluid consistency: Nectar Thick  Skin:  Reviewed, no issues  Last BM:  Unknown  Height:   Ht Readings from Last 1 Encounters:  04/06/17 6' (1.829 m)    Weight:   Wt Readings from Last 1 Encounters:  04/06/17 140 lb (63.5 kg)    Ideal Body Weight:  80.9 kg  BMI:  Body mass index is 18.99 kg/m.  Estimated Nutritional Needs:   Kcal:  8309-4076 (MSJ x 1.3-1.5)  Protein:  95-108 grams (1.5-1.7 grams/kg)  Fluid:  1.9-2.2 L/day (30-35 ml/kg)  EDUCATION NEEDS:   Education needs addressed  Willey Blade, MS, RD, LDN Pager: [phone] After Hours Pager: [phone]

## 2017-04-06 NOTE — ED Triage Notes (Signed)
Extremely weak, arrives to desk via wheelchair, patient to rrom 26 for triage when VS assessed. MD and RN x 2 to room. Patent states has felt SOB since yesterday. Wet cough. Breath sounds diminished.

## 2017-04-06 NOTE — ED Notes (Signed)
FIRST NURSE NOTE-pt arrives with wife. Wife reports she thinks he has PNA again.  Has been weak and confused. To wheelchair from car with assistance. Pt weak but able to answer questions at this time.

## 2017-04-06 NOTE — ED Provider Notes (Signed)
[facility] Emergency Department Provider Note  ____________________________________________   First MD Initiated Contact with Patient 04/06/17 [phone]     (approximate)  I have reviewed the triage vital signs and the nursing notes.   HISTORY  Chief Complaint Shortness of Breath  Level 5 exemption history limited by the patient's clinical condition  HPI Mark [name] is a 60 y.o. male whose wife brings him to the emergency department with shortness of breath and fatigue. He initially began getting sick roughly 2 days ago. He had cough shortness of breath and worsening fatigue. He has a past medical history of stage IV head and neck cancer He has received surgery chemotherapy and radiation. He has never had a tracheostomy. He had a feeding tube for many years and had it removed roughly 8 months ago although his wife says he has been aspirating   Past Medical History:  Diagnosis Date  . Arthritis    left hand  . Aspiration pneumonia ([place])   . Cancer ([place])    tonsilal ca  . COPD (chronic obstructive pulmonary disease) ([place])   . Incontinence    night time, began recently  . Memory deficit 11/18/2014  . PEG tube 11/18/2014   removed summer 2017  . Peripheral neuropathy 11/18/2014  . Smokers' cough ([place])   . Tonsillar cancer, S/P surgery, chemotherapy XRT 2009-followed at [facility] 11/18/2014  . Wears dentures    full upper and lower    Patient Active Problem List   Diagnosis Date Noted  . Obstruction of esophagus   . 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 Bronchiectasis ([place]) 03/26/2016  . Stricture and stenosis of esophagus   . 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 COPD (chronic obstructive pulmonary disease) ([place]) 12/19/2015  . Tobacco dependency 12/19/2015  . Throat cancer ([place]) 12/19/2015  . Problems with swallowing and mastication   . Mechanical complication of gastrostomy ([place])   . Gastritis   . Excessive urination at night  05/30/2015  . Encounter for screening for malignant neoplasm of prostate 05/30/2015  . FOM (frequency of micturition) 05/30/2015  . Sepsis ([place]) 04/23/2015  . Aspiration pneumonia ([place]) 11/18/2014  . Tonsillar cancer, S/P surgery, chemotherapy XRT 2009-followed at [facility] 11/18/2014  . Peripheral neuropathy 11/18/2014  . Adult failure to thrive 11/18/2014  . Recurrent aspiration pneumonia ([place]) 11/18/2014  . Memory deficit 11/18/2014  . PEG tube  11/18/2014    Past Surgical History:  Procedure Laterality Date  . APPENDECTOMY    . CHOLECYSTECTOMY  2007  . ESOPHAGEAL DILATION N/A 08/01/2016   Procedure: ESOPHAGEAL DILATION;  Surgeon: [name], MD;  Location: [place];  Service: Endoscopy;  Laterality: N/A;  . ESOPHAGEAL DILATION N/A 02/19/2017   Procedure: ESOPHAGEAL DILATION;  Surgeon: [name], MD;  Location: [place];  Service: Endoscopy;  Laterality: N/A;  . ESOPHAGOGASTRODUODENOSCOPY (EGD) WITH PROPOFOL N/A 01/24/2016   Procedure: ESOPHAGOGASTRODUODENOSCOPY (EGD) WITH gastric biopsy and esophageal dilation.;  Surgeon: [name], MD;  Location: [place];  Service: Endoscopy;  Laterality: N/A;  . ESOPHAGOGASTRODUODENOSCOPY (EGD) WITH PROPOFOL N/A 08/01/2016   Procedure: ESOPHAGOGASTRODUODENOSCOPY (EGD) WITH PROPOFOL;  Surgeon: [name], MD;  Location: [place];  Service: Endoscopy;  Laterality: N/A;  . ESOPHAGOGASTRODUODENOSCOPY (EGD) WITH PROPOFOL N/A 02/19/2017   Procedure: ESOPHAGOGASTRODUODENOSCOPY (EGD) WITH PROPOFOL;  Surgeon: [name], MD;  Location: [place];  Service: Endoscopy;  Laterality: N/A;  . PEG PLACEMENT N/A 11/28/2015   Procedure: PERCUTANEOUS ENDOSCOPIC GASTROSTOMY (PEG) PLACEMENT;  Surgeon: [name], MD;  Location: [place];  Service: Endoscopy;  Laterality: N/A;  . PEG TUBE PLACEMENT  10/22/12   10/22/12 - [facility], 10/23/14 - [facility], Dr. Allen Norris, replaced  . TONSILLECTOMY     jan 2010--stage IV     Prior to Admission medications   Medication Sig Start Date End Date Taking? Authorizing Provider  ARIPiprazole (ABILIFY) 2 MG tablet Take 2 mg by mouth daily.    Yes [provider]  clonazePAM (KLONOPIN) 1 MG tablet Take 1 mg by mouth 2 (two) times daily.    Yes [provider]  DULoxetine (CYMBALTA) 60 MG capsule Take 60 mg by mouth daily.   Yes [provider]  Ipratropium-Albuterol (COMBIVENT) 20-100 MCG/ACT AERS respimat Inhale 2 puffs into the lungs 2 (two) times daily.   Yes [provider]  mometasone (NASONEX) 50 MCG/ACT nasal spray Place 2 sprays into both nostrils 2 (two) times daily.   Yes [provider]  Oxycodone HCl 20 MG TABS Take 20 mg by mouth 4 (four) times daily.    Yes [provider]  pantoprazole (PROTONIX) 40 MG tablet Take 40 mg by mouth at bedtime.    Yes [provider]  traZODone (DESYREL) 150 MG tablet Take 150 mg by mouth at bedtime.    Yes [provider]  venlafaxine (EFFEXOR) 37.5 MG tablet Take 37.5 mg by mouth 2 (two) times daily with a meal.    Yes [provider]  albuterol (PROVENTIL HFA;VENTOLIN HFA) 108 (90 BASE) MCG/ACT inhaler Inhale 2 puffs into the lungs every 6 (six) hours as needed for wheezing or shortness of breath.     [provider]  Water For Irrigation, Sterile (FREE WATER) SOLN Place 200 mLs into feeding tube every 8 (eight) hours. Patient not taking: Reported on 04/06/2017 04/25/15   Fritzi Mandes, MD    Allergies Morphine and related  Family History  Problem Relation Age of Onset  . Liver cancer Mother   . Stroke Father   . Testicular cancer Father     Social History Social History  Substance Use Topics  . Smoking status: Current Every Day Smoker    Years: 40.00  . Smokeless tobacco: Never Used     Comment: was 2 PPD, now 2 cigs/day  . Alcohol use No    Review of Systems Level V exemption history Limited by the patient's clinical  condition ____________________________________________   PHYSICAL EXAM:  VITAL SIGNS: ED Triage Vitals [04/06/17 0722]  Enc Vitals Group     BP (!) 61/44     Pulse Rate (!) 160     Resp (!) 24     Temp 98.1 F (36.7 C)     Temp Source Oral     SpO2 (!) 73 %     Weight      Height      Head Circumference      Peak Flow      Pain Score      Pain Loc      Pain Edu?      Excl. in [place]?     Constitutional: appears critically ill somnolent responsive to painful stimulus only gurgling respirations Eyes: PERRL EOMI. Head: Atraumatic. Nose: No congestion/rhinnorhea. Mouth/Throat: No trismus Neck: No stridor.   Cardiovascular: achycardicrate, regular rhythm. Grossly normal heart sounds.  Good peripheral circulation. Respiratory: taking shallow frequent breast with coarse lung sounds throughout moving limited amounts of air Gastrointestinal: soft nontender Musculoskeletal: No lower extremity edema  Neurologic: no focal deficits noted but profoundly obtunded Skin:  Skin is warm, dry and intact. No rash noted.     ____________________________________________   DIFFERENTIAL includes but not limited to  Septic shock, pneumonia, COPD, pneumothorax, pulmonary was ____________________________________________   LABS (all labs ordered are listed, but only abnormal results are displayed)  Labs Reviewed  CBC WITH DIFFERENTIAL/PLATELET - Abnormal; Notable for the following:       Result Value   RDW 16.2 (*)    Lymphs Abs 0.3 (*)    All other components within normal limits  PROTIME-INR - Abnormal; Notable for the following:    Prothrombin Time 16.2 (*)    All other components within normal limits  CULTURE, BLOOD (ROUTINE X 2)  CULTURE, BLOOD (ROUTINE X 2)  URINE CULTURE  LACTIC ACID, PLASMA  LACTIC ACID, PLASMA  COMPREHENSIVE METABOLIC PANEL  LIPASE, BLOOD  TROPONIN I  PROCALCITONIN  URINALYSIS, ROUTINE W REFLEX MICROSCOPIC    CBC unremarkable remainder of labs are  pending __________________________________________  EKG  ED ECG REPORT I, Darel Hong, the attending physician, personally viewed and interpreted this ECG.  Date: 04/06/2017 EKG Time:  Rate: 141 Rhythm: sinus tachycardia QRS Axis: normal Intervals: prolonged QTC ST/T Wave abnormalities: normal Narrative Interpretation: abnormal  ____________________________________________  RADIOLOGY  Chest x-ray consistent with multifocal pneumonia ____________________________________________   PROCEDURES  Procedure(s) performed: no  Procedures  Critical Care performed: yes  CRITICAL CARE Performed by: Darel Hong   Total critical care time: 40 minutes  Critical care time was exclusive of separately billable procedures and treating other patients.  Critical care was necessary to treat or prevent imminent or life-threatening deterioration.  Critical care was time spent personally by me on the following activities: development of treatment plan with patient and/or surrogate as well as nursing, discussions with consultants, evaluation of patient's response to treatment, examination of patient, obtaining history from patient or surrogate, ordering and performing treatments and interventions, ordering and review of laboratory studies, ordering and review of radiographic studies, pulse oximetry and re-evaluation of patient's condition.   Observation: no ____________________________________________   INITIAL IMPRESSION / ASSESSMENT AND PLAN / ED COURSE  Pertinent labs & imaging results that were available during my care of the patient were reviewed by me and considered in my medical decision making (see chart for details).  He patient arrived critically ill  Hypoxic to 73%, hypotensive to 61/44, and tachycardic to 160 consistent with septic shock with profound altered mental status.  His sats initially slowly responded to NRB mask, however he remained obtunded with gurgling  respirations. I set up for endotracheal intubation however after fluid went in and a little bit more time on oxygen the patient's mental status suddenly improved. At this point I'm deferring endotracheal intubation as he is more comfortable. His pneumonia would be community-acquired although given the concern for aspiration I'm covering him with Zosyn. He will require likely ICU level care.      ____________________________________________   FINAL CLINICAL IMPRESSION(S) / ED DIAGNOSES  Final diagnoses:  Septic shock ([place])  Multifocal pneumonia      NEW MEDICATIONS STARTED DURING THIS VISIT:  New Prescriptions   No medications on file     Note:  This document was prepared using Dragon voice recognition software and may include unintentional dictation errors.     Darel Hong, MD 04/06/17 [phone]

## 2017-04-06 NOTE — Progress Notes (Signed)
Pharmacy Antibiotic Note  Mark [name] is a 62 y.o. male admitted on 04/06/2017 with SOB.  Pharmacy has been consulted for Zosyn dosing for PNA.  Plan: Zosyn 3.375 g EI q 8 hours beginning 6 hours after initial dose.   Height: 6' (182.9 cm) Weight: 140 lb (63.5 kg) IBW/kg (Calculated) : 77.6  Temp (24hrs), Avg:98.1 F (36.7 C), Min:98.1 F (36.7 C), Max:98.1 F (36.7 C)   Recent Labs Lab 04/06/17 0731  WBC 6.2  CREATININE 1.55*  LATICACIDVEN 2.8*    Estimated Creatinine Clearance: 45 mL/min (A) (by C-G formula based on SCr of 1.55 mg/dL (H)).    Allergies  Allergen Reactions  . Morphine And Related Nausea And Vomiting    Antimicrobials this admission: Zosyn 8/20 >>  vancomcyin 8/20 x 1  Dose adjustments this admission:   Microbiology results: 8/20 BCx: sent  8/20 Sputum: sent  8/20 MRSA PCR: negative  Thank you for allowing pharmacy to be a part of this patient's care.  Ulice Dash D 04/06/2017 11:58 AM

## 2017-04-07 ENCOUNTER — Encounter: Payer: Self-pay | Admitting: General Practice

## 2017-04-07 LAB — HIV ANTIBODY (ROUTINE TESTING W REFLEX): HIV SCREEN 4TH GENERATION: NONREACTIVE

## 2017-04-07 LAB — BASIC METABOLIC PANEL
ANION GAP: 10 (ref 5–15)
BUN: 26 mg/dL — ABNORMAL HIGH (ref 6–20)
CHLORIDE: 102 mmol/L (ref 101–111)
CO2: 31 mmol/L (ref 22–32)
Calcium: 8.8 mg/dL — ABNORMAL LOW (ref 8.9–10.3)
Creatinine, Ser: 0.79 mg/dL (ref 0.61–1.24)
GFR calc non Af Amer: >60 mL/min (ref >60)
Glucose, Bld: 178 mg/dL — ABNORMAL HIGH (ref 65–99)
Potassium: 4 mmol/L (ref 3.5–5.1)
SODIUM: 143 mmol/L (ref 135–145)

## 2017-04-07 LAB — CBC
HEMATOCRIT: 36.9 % — AB (ref 40.0–52.0)
Hemoglobin: 12.3 g/dL — ABNORMAL LOW (ref 13.0–18.0)
MCH: 29.4 pg (ref 26.0–34.0)
MCHC: 33.2 g/dL (ref 32.0–36.0)
MCV: 88.6 fL (ref 80.0–100.0)
Platelets: 125 10*3/uL — ABNORMAL LOW (ref 150–440)
RBC: 4.16 MIL/uL — AB (ref 4.40–5.90)
RDW: 16.5 % — ABNORMAL HIGH (ref 11.5–14.5)
WBC: 9.5 10*3/uL (ref 3.8–10.6)

## 2017-04-07 LAB — GLUCOSE, CAPILLARY: GLUCOSE-CAPILLARY: 148 mg/dL — AB (ref 65–99)

## 2017-04-07 LAB — LACTIC ACID, PLASMA: LACTIC ACID, VENOUS: 2.5 mmol/L — AB (ref 0.5–1.9)

## 2017-04-07 MED ORDER — OXYCODONE HCL 5 MG PO TABS
10.0000 mg | ORAL_TABLET | Freq: Four times a day (QID) | ORAL | Status: DC | PRN
  Administered 2017-04-07 – 2017-04-11 (×11): 10 mg via ORAL
  Filled 2017-04-07 (×11): qty 2

## 2017-04-07 MED ORDER — SODIUM CHLORIDE 0.9 % IV SOLN: INTRAVENOUS | Status: DC

## 2017-04-07 MED ORDER — CLONAZEPAM 0.5 MG PO TABS: 1.0000 mg | ORAL_TABLET | Freq: Two times a day (BID) | ORAL | Status: DC | PRN

## 2017-04-07 MED ORDER — POTASSIUM CHLORIDE 2 MEQ/ML IV SOLN
INTRAVENOUS | Status: DC
  Administered 2017-04-07: 12:00:00 via INTRAVENOUS
  Filled 2017-04-07 (×2): qty 1000

## 2017-04-07 NOTE — Progress Notes (Signed)
Pharmacy Antibiotic Note  Mark [name] is a 61 y.o. male admitted on 04/06/2017 with SOB.  Pharmacy has been consulted for Zosyn dosing for PNA.  Plan: Zosyn 3.375 g EI q 8 hours  Height: 6' (182.9 cm) Weight: 128 lb 8.5 oz (58.3 kg) IBW/kg (Calculated) : 77.6  Temp (24hrs), Avg:97.7 F (36.5 C), Min:97.6 F (36.4 C), Max:97.7 F (36.5 C)   Recent Labs Lab 04/06/17 0731 04/06/17 1058 04/07/17 0343  WBC 6.2  --  9.5  CREATININE 1.55*  --  0.79  LATICACIDVEN 2.8* 2.5*  --     Estimated Creatinine Clearance: 80 mL/min (by C-G formula based on SCr of 0.79 mg/dL).    Allergies  Allergen Reactions  . Morphine And Related Nausea And Vomiting    Antimicrobials this admission: Zosyn 8/20 >>  vancomcyin 8/20 x 1  Dose adjustments this admission:   Microbiology results: 8/20 BCx: no growth in <24 hours 8/20 Sputum: abundant gram negative rods, culture reincubated for better growth 8/20 MRSA PCR: negative  Thank you for allowing pharmacy to be a part of this patient's care.  Durwin Reges, PharmD Student 04/07/2017 11:02 AM

## 2017-04-07 NOTE — Progress Notes (Signed)
[place] [facility] [place] NAME: Mark [name]    MR#:  [id number]  DATE OF BIRTH:  [birth year]  SUBJECTIVE:  CHIEF COMPLAINT:   Chief Complaint  Patient presents with  . Shortness of Breath   Patient is 61 year old Caucasian male with history of squamous cell carcinoma head and neck, status post surgery, chemotherapy, radiation therapy in 2009, history of dysphagia, previous G-tube, which is now removed, recurrent aspiration pneumonitis, who presents to the hospital with complaints of respiratory failure, oxygen saturations to 70s, hypertension. Chest x-ray was consistent with multifocal pneumonia. He was admitted to the hospital, now on high flow oxygen via nasal cannulas, he feels relatively good, admits of some cough but no sputum production. His blood pressure remains low in the 80s Review of Systems  Constitutional: Negative for chills, fever and weight loss.  HENT: Negative for congestion.   Eyes: Negative for blurred vision and double vision.  Respiratory: Positive for cough, shortness of breath and wheezing. Negative for sputum production.   Cardiovascular: Negative for chest pain, palpitations, orthopnea, leg swelling and PND.  Gastrointestinal: Negative for abdominal pain, blood in stool, constipation, diarrhea, nausea and vomiting.  Genitourinary: Negative for dysuria, frequency, hematuria and urgency.  Musculoskeletal: Negative for falls.  Neurological: Negative for dizziness, tremors, focal weakness and headaches.  Endo/Heme/Allergies: Does not bruise/bleed easily.  Psychiatric/Behavioral: Negative for depression. The patient does not have insomnia.     VITAL SIGNS: Blood pressure 97/70, pulse (!) 102, temperature 98.7 F (37.1 C), temperature source Oral, resp. rate 15, height 6' (1.829 m), weight 58.3 kg (128 lb 8.5 oz), SpO2 99 %.  PHYSICAL EXAMINATION:   GENERAL:  61 y.o.-year-old patient sitting in the bed in mild to  moderate respiratory distress, tachypneic, uncomfortable, somewhat somnolent, dazed.  EYES: Pupils equal, round, reactive to light and accommodation. No scleral icterus. Extraocular muscles intact.  HEENT: Head atraumatic, normocephalic. Oropharynx and nasopharynx clear. Dry oral mucosa, dry secretions in the mouth NECK:  Supple, no jugular venous distention. No thyroid enlargement, no tenderness.  LUNGS: Diminished breath sounds bilaterally, no wheezing, scattered rales,rhonchi and crepitations bilaterally .Intermittent use of accessory muscles of respiration.  CARDIOVASCULAR: S1, S2 normal. No murmurs, rubs, or gallops.  ABDOMEN: Soft, nontender, nondistended. Bowel sounds present. No organomegaly or mass.  EXTREMITIES: No pedal edema, cyanosis, or clubbing.  NEUROLOGIC: Cranial nerves II through XII are intact. Muscle strength 5/5 in all extremities. Sensation intact. Gait not checked.  PSYCHIATRIC: The patient is somnolent, awakened with verbal stimuli and oriented x 3.  SKIN: No obvious rash, lesion, or ulcer.   ORDERS/RESULTS REVIEWED:   CBC  Recent Labs Lab 04/06/17 0731 04/07/17 0343  WBC 6.2 9.5  HGB 14.2 12.3*  HCT 42.5 36.9*  PLT 154 125*  MCV 87.9 88.6  MCH 29.4 29.4  MCHC 33.4 33.2  RDW 16.2* 16.5*  LYMPHSABS 0.3*  --   MONOABS 0.3  --   EOSABS 0.0  --   BASOSABS 0.0  --    ------------------------------------------------------------------------------------------------------------------  Chemistries   Recent Labs Lab 04/06/17 0731 04/07/17 0343  NA 139 143  K 4.1 4.0  CL 100* 102  CO2 28 31  GLUCOSE 137* 178*  BUN 44* 26*  CREATININE 1.55* 0.79  CALCIUM 8.8* 8.8*  MG 1.8  --   AST 40  --   ALT 16*  --   ALKPHOS 64  --   BILITOT 0.8  --    ------------------------------------------------------------------------------------------------------------------ estimated creatinine clearance  is 80 mL/min (by C-G formula based on SCr of 0.79  mg/dL). ------------------------------------------------------------------------------------------------------------------ No results for input(s): TSH, T4TOTAL, T3FREE, THYROIDAB in the last 72 hours.  Invalid input(s): FREET3  Cardiac Enzymes  Recent Labs Lab 04/06/17 0731 04/06/17 1058  TROPONINI <0.03 <0.03   ------------------------------------------------------------------------------------------------------------------ Invalid input(s): POCBNP ---------------------------------------------------------------------------------------------------------------  RADIOLOGY: Dg Chest Port 1 View  Result Date: 04/06/2017 CLINICAL DATA:  Shortness of breath.  Confusion. EXAM: PORTABLE CHEST 1 VIEW COMPARISON:  02/15/2016 . FINDINGS: Mediastinum hilar structures are normal. Multifocal bilateral pulmonary infiltrates are noted. Findings consist with pneumonia. Follow-up exam suggested to demonstrate clearing and to exclude underlying mass lesions. COPD . Mild left base pleural thickening again noted consistent scarring. No pneumothorax. No acute bony abnormality. IMPRESSION: 1. Multifocal bilateral pulmonary infiltrates consistent with pneumonia. Follow-up exams demonstrate clearing suggested. 2. COPD. Electronically Signed   By: Marcello Moores  Register   On: 04/06/2017 07:47    EKG:  Orders placed or performed during the hospital encounter of 04/06/17  . ED EKG 12-Lead  . ED EKG 12-Lead    ASSESSMENT AND PLAN:  Active Problems:   Severe sepsis with septic shock (HCC)   Multifocal pneumonia   Lactic acidosis   Acute respiratory failure with hypoxia ([place])   Septic shock (HCC)  #1. Severe sepsis with septic shock, blood cultures are negative so far, sputum cultures revealed gram-negative rods, MRSA PCR was negative, continue patient on Zosyn, discontinue vancomycin, continue oxygen therapy as needed, discussed this pulmonologist intensivist, Dr. Alva Garnet #2 Multifocal pneumonia, suspected  aspiration pneumonitis, continue Zosyn, oxygen therapy, sputum cultures revealed gram-negative rods, discussed this pulmonologist, Dr. Alva Garnet #3. Lactic acidosis, following lactic acid level, continue IV fluids  #4. Respiratory failure with hypoxia, now on high flow oxygen, FiO2 of 60%  #5 acute renal insufficiency, resolved with IV fluids  #6. Mild anemia, thrombocytopenia, follow with therapy, unlikely DIC, ProTime is unremarkable  #7. Dysphagia, speech therapist consultation is requested, continue IV fluids for now, patient is nothing by mouth  Management plans discussed with the patient, family and they are in agreement.   DRUG ALLERGIES:  Allergies  Allergen Reactions  . Morphine And Related Nausea And Vomiting    CODE STATUS:     Code Status Orders        Start     Ordered   04/06/17 0917  Full code  Continuous     04/06/17 0917    Code Status History    Date Active Date Inactive Code Status Order ID Comments User Context   04/23/2015  1:01 PM 04/25/2015  5:57 PM Full Code [id number]  Loletha Grayer, MD ED   11/18/2014  5:52 PM 11/21/2014  3:04 PM Full Code [id number]  Nita Sells, MD Inpatient      TOTAL TIME TAKING CARE OF THIS PATIENT: 40  minutes.    Theodoro Grist M.D on 04/07/2017 at 12:30 PM  Between 7am to 6pm - Pager - [phone]  After 6pm go to [email or web address] - password [email or web address] [facility]  Office  [phone]  CC: Primary care physician; Secundino Ginger, PA-C

## 2017-04-07 NOTE — Progress Notes (Signed)
PULMONARY / CRITICAL CARE MEDICINE   Name: Mark [name] MRN: [id number] DOB: Jan 13, 1956    ADMISSION DATE:  04/06/2017  HISTORY OF PRESENT ILLNESS:   89 M with history of squamous cell carcinoma of head and neck, status post surgery/chemotherapy/XRT 2009. He is left with severe dysphagia. He previously had a G-tube but had this removed. He has had recurrent aspiration pneumonia. He presented to the ED with acute hypoxemic respiratory failure and bilateral pulmonary infiltrates consistent with multifocal pneumonia. He was initially hypotensive but this improved with normal saline boluses. He is admitted to ICU/SDU for HFNC   SUBJECTIVE:  Somnolent but arousable. No overt dyspnea but requiring HF [place] O2. No new complaints  VITAL SIGNS: BP 97/70   Pulse (!) 102   Temp 98.7 F (37.1 C) (Oral)   Resp 15   Ht 6' (1.829 m)   Wt 128 lb 8.5 oz (58.3 kg)   SpO2 99%   BMI 17.43 kg/m   HEMODYNAMICS:    VENTILATOR SETTINGS: FiO2 (%):  [60 %] 60 %  INTAKE / OUTPUT: I/O last 3 completed shifts: In: 2413.1 [I.V.:258.1; IV Piggyback:2155] Out: 925 [Urine:925]  PHYSICAL EXAMINATION: General: Thin, no distress, somnolent Neuro: CNs intact, no focal deficits, DTRs symmetric HEENT: NCAT, sclerae white Cardiovascular: Regular, no M Lungs: scattered rhonchi Abdomen: Soft, NT, NABS Ext: no edema  LABS:  BMET  Recent Labs Lab 04/06/17 0731 04/07/17 0343  NA 139 143  K 4.1 4.0  CL 100* 102  CO2 28 31  BUN 44* 26*  CREATININE 1.55* 0.79  GLUCOSE 137* 178*    Electrolytes  Recent Labs Lab 04/06/17 0731 04/07/17 0343  CALCIUM 8.8* 8.8*  MG 1.8  --     CBC  Recent Labs Lab 04/06/17 0731 04/07/17 0343  WBC 6.2 9.5  HGB 14.2 12.3*  HCT 42.5 36.9*  PLT 154 125*    Coag's  Recent Labs Lab 04/06/17 0731  INR 1.29    Sepsis Markers  Recent Labs Lab 04/06/17 0731 04/06/17 1058  LATICACIDVEN 2.8* 2.5*  PROCALCITON 49.46  --     ABG  Recent Labs Lab  04/06/17 0821  PHART 7.33*  PCO2ART 50*  PO2ART 67*    Liver Enzymes  Recent Labs Lab 04/06/17 0731  AST 40  ALT 16*  ALKPHOS 64  BILITOT 0.8  ALBUMIN 4.0    Cardiac Enzymes  Recent Labs Lab 04/06/17 0731 04/06/17 1058  TROPONINI <0.03 <0.03    Glucose  Recent Labs Lab 04/06/17 0943 04/07/17 0823  GLUCAP 184* 148*    CXR: NNF  ASSESSMENT / PLAN: Acute hypoxemic respiratory failure Aspiration pneumonia AKI Severe sepsis Chronic pain syndrome - appears to be overmedicated presently  Continue supplemental oxygen to maintain SPO2 >90% Continue piperacillin-tazobactam IVFs adjusted Monitor BMET intermittently Monitor I/Os Correct electrolytes as indicated  Change clonazepam and oxycodone to when necessary only Needs to be monitored in ICU/SDU until oxygen requirements improved Recheck CXR in a.m.  Merton Border, MD PCCM service Mobile [phone] Pager [phone] 04/07/2017 2:20 PM  04/07/2017, 2:20 PM

## 2017-04-07 NOTE — Progress Notes (Signed)
SLP Contact Note  Patient Details Name: Mark [name] MRN: [id number] DOB: 09/07/1955   Cancelled treatment:       Reason Eval/Treat Not Completed: Fatigue/lethargy limiting ability to participate (pain meds). RN reports pt has been eating some today, and appears to tolerate po intake. Wife not present at this time. ST will continue to follow for education and to determine if MBS is warranted.  [name] B. Quentin Ore [facility], CCC-SLP Speech Pathologist [phone]  Shonna Chock 04/07/2017, 3:36 PM

## 2017-04-08 ENCOUNTER — Inpatient Hospital Stay
Admit: 2017-04-08 | Discharge: 2017-04-08 | Disposition: A | Discharge status: Home / Self Care | Payer: Medicaid Other | Attending: Internal Medicine | Admitting: Internal Medicine

## 2017-04-08 ENCOUNTER — Inpatient Hospital Stay: Payer: Medicaid Other

## 2017-04-08 DIAGNOSIS — A419 Sepsis, unspecified organism: Principal | ICD-10-CM

## 2017-04-08 DIAGNOSIS — R6521 Severe sepsis with septic shock: Secondary | ICD-10-CM

## 2017-04-08 LAB — BASIC METABOLIC PANEL
Anion gap: 7 (ref 5–15)
BUN: 22 mg/dL — AB (ref 6–20)
CALCIUM: 8.8 mg/dL — AB (ref 8.9–10.3)
CO2: 33 mmol/L — AB (ref 22–32)
CREATININE: 0.62 mg/dL (ref 0.61–1.24)
Chloride: 102 mmol/L (ref 101–111)
GFR calc Af Amer: >60 mL/min (ref >60)
GFR calc non Af Amer: >60 mL/min (ref >60)
GLUCOSE: 127 mg/dL — AB (ref 65–99)
Potassium: 3.6 mmol/L (ref 3.5–5.1)
Sodium: 142 mmol/L (ref 135–145)

## 2017-04-08 LAB — CULTURE, RESPIRATORY W GRAM STAIN

## 2017-04-08 LAB — ECHOCARDIOGRAM COMPLETE
HEIGHTINCHES: 72 in
WEIGHTICAEL: 2063.51 [oz_av]

## 2017-04-08 LAB — CBC
HCT: 33 % — ABNORMAL LOW (ref 40.0–52.0)
Hemoglobin: 11.4 g/dL — ABNORMAL LOW (ref 13.0–18.0)
MCH: 30.5 pg (ref 26.0–34.0)
MCHC: 34.5 g/dL (ref 32.0–36.0)
MCV: 88.5 fL (ref 80.0–100.0)
PLATELETS: 112 10*3/uL — AB (ref 150–440)
RBC: 3.72 MIL/uL — ABNORMAL LOW (ref 4.40–5.90)
RDW: 16.3 % — ABNORMAL HIGH (ref 11.5–14.5)
WBC: 8.4 10*3/uL (ref 3.8–10.6)

## 2017-04-08 LAB — GLUCOSE, CAPILLARY: GLUCOSE-CAPILLARY: 152 mg/dL — AB (ref 65–99)

## 2017-04-08 LAB — CULTURE, RESPIRATORY

## 2017-04-08 MED ORDER — ALBUTEROL SULFATE (2.5 MG/3ML) 0.083% IN NEBU
2.5000 mg | INHALATION_SOLUTION | RESPIRATORY_TRACT | Status: DC | PRN
Start: 2017-04-08 — End: 2017-04-11

## 2017-04-08 MED ORDER — SENNOSIDES-DOCUSATE SODIUM 8.6-50 MG PO TABS
1.0000 | ORAL_TABLET | Freq: Two times a day (BID) | ORAL | Status: DC
  Administered 2017-04-08 – 2017-04-10 (×3): 1 via ORAL
  Filled 2017-04-08 (×5): qty 1

## 2017-04-08 MED ORDER — DEXTROSE 5 % IV SOLN
1.0000 g | INTRAVENOUS | Status: DC
  Administered 2017-04-08 – 2017-04-11 (×3): 1 g via INTRAVENOUS
  Filled 2017-04-08 (×4): qty 10

## 2017-04-08 MED ORDER — IPRATROPIUM-ALBUTEROL 0.5-2.5 (3) MG/3ML IN SOLN
3.0000 mL | Freq: Four times a day (QID) | RESPIRATORY_TRACT | Status: DC
  Administered 2017-04-08 – 2017-04-11 (×11): 3 mL via RESPIRATORY_TRACT
  Filled 2017-04-08 (×11): qty 3

## 2017-04-08 NOTE — Progress Notes (Signed)
[place] at [place] NAME: Mark [name]    MR#:  [id number]  DATE OF BIRTH:  1956-01-16  SUBJECTIVE:  CHIEF COMPLAINT:   Chief Complaint  Patient presents with  . Shortness of Breath   Patient is 61 year old Caucasian male with history of squamous cell carcinoma head and neck, status post surgery, chemotherapy, radiation therapy in 2009, history of dysphagia, previous G-tube, which is now removed, recurrent aspiration pneumonitis, who presents to the hospital with complaints of respiratory failure, oxygen saturations to 70s, hypertension. Chest x-ray was consistent with multifocal pneumonia. He was admitted to the hospital, now on high flow oxygen via nasal cannulas, he feels relatively good, admits of some cough but no sputum production. His blood pressure Has somewhat improved on IV fluids. Sputum culture revealed Klebsiella. Antibiotic is being changed from Zosyn to Rocephin intravenously. Repeat the chest is a revealed persistent changes of pneumonia. The patient was seen by speech pathologist, recommended dysphagia 2 diet, which he tolerates.    Review of Systems  Constitutional: Negative for chills, fever and weight loss.  HENT: Negative for congestion.   Eyes: Negative for blurred vision and double vision.  Respiratory: Positive for cough, shortness of breath and wheezing. Negative for sputum production.   Cardiovascular: Negative for chest pain, palpitations, orthopnea, leg swelling and PND.  Gastrointestinal: Negative for abdominal pain, blood in stool, constipation, diarrhea, nausea and vomiting.  Genitourinary: Negative for dysuria, frequency, hematuria and urgency.  Musculoskeletal: Negative for falls.  Neurological: Negative for dizziness, tremors, focal weakness and headaches.  Endo/Heme/Allergies: Does not bruise/bleed easily.  Psychiatric/Behavioral: Negative for depression. The patient does not have insomnia.      VITAL SIGNS: Blood pressure (!) 93/58, pulse 90, temperature 97.8 F (36.6 C), temperature source Oral, resp. rate 17, height 6' (1.829 m), weight 58.5 kg (128 lb 15.5 oz), SpO2 94 %.  PHYSICAL EXAMINATION:   GENERAL:  61 y.o.-year-old patient sitting in the bed in significant respiratory distress, much more alert and comfortable  EYES: Pupils equal, round, reactive to light and accommodation. No scleral icterus. Extraocular muscles intact.  HEENT: Head atraumatic, normocephalic. Oropharynx and nasopharynx clear. Dry oral mucosa, dry secretions in the mouth NECK:  Supple, no jugular venous distention. No thyroid enlargement, no tenderness.  LUNGSBetter air entrance bilaterally, no wheezing, scattered rales,rhonchi and crepitation, more on the right side anteriorly.   intermittent use of accessory muscles of respiration.  CARDIOVASCULAR: S1, S2 normal. No murmurs, rubs, or gallops.  ABDOMEN: Soft, nontender, nondistended. Bowel sounds present. No organomegaly or mass.  EXTREMITIES: No pedal edema, cyanosis, or clubbing.  NEUROLOGIC: Cranial nerves II through XII are intact. Muscle strength 5/5 in all extremities. Sensation intact. Gait not checked.  PSYCHIATRIC: The patient ialert and oriented 3 SKIN: No obvious rash, lesion, or ulcer.   ORDERS/RESULTS REVIEWED:   CBC  Recent Labs Lab 04/06/17 0731 04/07/17 0343 04/08/17 0503  WBC 6.2 9.5 8.4  HGB 14.2 12.3* 11.4*  HCT 42.5 36.9* 33.0*  PLT 154 125* 112*  MCV 87.9 88.6 88.5  MCH 29.4 29.4 30.5  MCHC 33.4 33.2 34.5  RDW 16.2* 16.5* 16.3*  LYMPHSABS 0.3*  --   --   MONOABS 0.3  --   --   EOSABS 0.0  --   --   BASOSABS 0.0  --   --    ------------------------------------------------------------------------------------------------------------------  Chemistries   Recent Labs Lab 04/06/17 0731 04/07/17 0343 04/08/17 0503  NA 139 143 142  K 4.1 4.0 3.6  CL 100* 102 102  CO2 28 31 33*  GLUCOSE 137* 178* 127*  BUN  44* 26* 22*  CREATININE 1.55* 0.79 0.62  CALCIUM 8.8* 8.8* 8.8*  MG 1.8  --   --   AST 40  --   --   ALT 16*  --   --   ALKPHOS 64  --   --   BILITOT 0.8  --   --    ------------------------------------------------------------------------------------------------------------------ estimated creatinine clearance is 80.2 mL/min (by C-G formula based on SCr of 0.62 mg/dL). ------------------------------------------------------------------------------------------------------------------ No results for input(s): TSH, T4TOTAL, T3FREE, THYROIDAB in the last 72 hours.  Invalid input(s): FREET3  Cardiac Enzymes  Recent Labs Lab 04/06/17 0731 04/06/17 1058  TROPONINI <0.03 <0.03   ------------------------------------------------------------------------------------------------------------------ Invalid input(s): POCBNP ---------------------------------------------------------------------------------------------------------------  RADIOLOGY: Dg Chest Port 1 View  Result Date: 04/08/2017 CLINICAL DATA:  Respiratory failure. EXAM: PORTABLE CHEST 1 VIEW COMPARISON:  04/06/2017. FINDINGS: Mediastinum stable. Heart size stable. Persistent multifocal bilateral pulmonary infiltrates, unchanged from prior exam. Findings most consistent pneumonia. Continued follow-up exam suggested to demonstrate clearing. Low lung volumes. COPD. No pleural effusion or pneumothorax. IMPRESSION: 1. Persistent multifocal bilateral pulmonary infiltrates without significant interim change. Low lung volumes. Continued follow-up chest x-rays to demonstrate clearing suggested. 2.  COPD. Electronically Signed   By: Marcello Moores  Register   On: 04/08/2017 06:15    EKG:  Orders placed or performed during the hospital encounter of 04/06/17  . ED EKG 12-Lead  . ED EKG 12-Lead    ASSESSMENT AND PLAN:  Active Problems:   Severe sepsis with septic shock (HCC)   Multifocal pneumonia   Lactic acidosis   Acute respiratory failure  with hypoxia ([place])   Septic shock (HCC)  #1. Severe sepsis with septic shock, blood cultures are negative , sputum cultures revealed Klebsiella pneumonia, MRSA PCR was negative, changing  Zosynto Rocephin intravenously, now off vancomycin , continue oxygen therapy as needed, discussed this pulmonologist intensivist, Dr. Alva Garnet today again  #2 Multifocal pneumoniadue to Klebsiella pneumonia. Continue Rocephin , oxygen therapy,  discussed this pulmonologist, Dr. Alva Garnet #3. Lactic acidosis, continue IV fluids  #4. Respiratory failure with hypoxia, now on high flow oxygen, FiO2 of 30% , weaning off oxygen  #5 acute renal insufficiency, resolved with IV fluids  #6. Mild anemia, thrombocytopenia, some worse today, follow with therapy, unlikely DIC, ProTime is unremarkable  #7. Dysphagia, speech therapist consultation is appreciated, , continue IV fluids for now, patient ion dysphagia 2 diet, tolerates it well, may need modified barium swallowing study    . Treatment plans discussed with the patient, family and they are in agreement.   DRUG ALLERGIES:  Allergies  Allergen Reactions  . Morphine And Related Nausea And Vomiting    CODE STATUS:     Code Status Orders        Start     Ordered   04/06/17 0917  Full code  Continuous     04/06/17 0917    Code Status History    Date Active Date Inactive Code Status Order ID Comments User Context   04/23/2015  1:01 PM 04/25/2015  5:57 PM Full Code [id number]  Loletha Grayer, MD ED   11/18/2014  5:52 PM 11/21/2014  3:04 PM Full Code [id number]  Nita Sells, MD Inpatient      TOTAL TIME TAKING CARE OF THIS PATIENT: 35 minutes.    Theodoro Grist M.D on 04/08/2017 at 11:28 AM  Between 7am to 6pm - Pager - [phone]  After 6pm go to [email or web address] - password [email or web address] [facility]  Office  [phone]  CC: Primary care physician; Secundino Ginger, PA-C

## 2017-04-08 NOTE — Progress Notes (Signed)
*  PRELIMINARY RESULTS* Echocardiogram 2D Echocardiogram has been performed.  Sherrie Sport 04/08/2017, 9:29 AM

## 2017-04-08 NOTE — Progress Notes (Signed)
More responsive today Oxygen requirements much improved No new complaints Rattling cough  Vitals:   04/08/17 1000 04/08/17 1030 04/08/17 1100 04/08/17 1200  BP: (!) 92/59 95/73 (!) 93/58 100/61  Pulse: (!) 102 93 90 95  Resp: (!) 23 19 17    Temp:    98 F (36.7 C)  TempSrc:    Oral  SpO2: 90% 92% 94% (!) 85%  Weight:      Height:       NAD at rest No JVD Diminished breath sounds, right basilar crackles Regular, no M Abdomen soft, NT, NABS Extremities without edema  BMP Latest Ref Rng & Units 04/08/2017 04/07/2017 04/06/2017  Glucose 65 - 99 mg/dL 127(H) 178(H) 137(H)  BUN 6 - 20 mg/dL 22(H) 26(H) 44(H)  Creatinine 0.61 - 1.24 mg/dL 0.62 0.79 1.55(H)  Sodium 135 - 145 mmol/L 142 143 139  Potassium 3.5 - 5.1 mmol/L 3.6 4.0 4.1  Chloride 101 - 111 mmol/L 102 102 100(L)  CO2 22 - 32 mmol/L 33(H) 31 28  Calcium 8.9 - 10.3 mg/dL 8.8(L) 8.8(L) 8.8(L)    CBC Latest Ref Rng & Units 04/08/2017 04/07/2017 04/06/2017  WBC 3.8 - 10.6 K/uL 8.4 9.5 6.2  Hemoglobin 13.0 - 18.0 g/dL 11.4(L) 12.3(L) 14.2  Hematocrit 40.0 - 52.0 % 33.0(L) 36.9(L) 42.5  Platelets 150 - 440 K/uL 112(L) 125(L) 154    Sputum culture: Klebsiella CXR: Slight increase in right lower lobe opacity, small left lower lobe opacity  ASSESSMENT / PLAN: -Acute hypoxemic respiratory failure -Aspiration pneumonia - recurrent. Due to xerostomia and dysphagia after treatment of head and neck cancer -AKI - resolved -Severe sepsis - resolved  Continue supplemental oxygen to maintain SPO2 >90% Change paper so on-tazobactam 2 ceftriaxone and complete 7-10 days Advance diet and activity Transfer to MedSurg After transfer, PCCM will sign off. Please call if we can be of further assistance   Merton Border, MD PCCM service Mobile [phone] Pager [phone] 04/08/2017 2:18 PM

## 2017-04-08 NOTE — Evaluation (Signed)
Physical Therapy Evaluation Patient Details Name: Mark [name] MRN: [id number] DOB: 1956-01-22 Today's Date: 04/08/2017   History of Present Illness  presented to ER secondary to SOB, R flank pain, noted to be hypoxic, hypotensive; admitted with sepsis with septic shock related to multifocal PNA (with bilat infiltrates noted).  Requiring HFNC to maintain adequate saturations at this time.  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  Eager for [place] activities as able.  Bilat UE/LE strenght and ROM grossly WFL for basic transfers and mobility, but generally deconditioned (compared to baseline) due to acute illness.  Demonstrates ability to complete bed mobility with mod indep.  With transition to upright/unsupported sitting edge of bed, patient noted with desat to low-80% on HFNC.  Unable to recover despite rest period and pursed lip breathing; requiring return to supine for recovery to >90%. Unsafe/unable to attempt additional mobility at this time as result. Will continue to assess/progress as medically appropriate. RN informed/aware. Would benefit from skilled PT to address above deficits and promote optimal return to PLOF. Unable to initiate OOB activity for full mobility assessment at this time; will continue assessment and recommendations in subsequent session.  Anticipate patient appropriate for HHPT as respiratory status improves.    Follow Up Recommendations Other (comment) (unable to initiate OOB activity for full mobility assessment at this time; will continue assessment and recommendations in subsequent session.  Anticipate patient appropriate for HHPT as respiratory status improves.)    Equipment Recommendations       Recommendations for Other Services       Precautions / Restrictions  Precautions Precautions: Fall Precaution Comments: Nectar thick liquids Restrictions Weight Bearing Restrictions: No      Mobility  Bed Mobility Overal bed mobility: Modified Independent                Transfers                 General transfer comment: maintains static sitting with distant sup/mod indep; unable/unsafe to attempt OOB due to drop in O2 sats with transition to upright and exertional activity  Ambulation/Gait             General Gait Details: unable/unsafe to attempt OOB due to drop in O2 sats with transition to upright and exertional activity  Stairs            Wheelchair Mobility    Modified Rankin (Stroke Patients Only)       Balance Overall balance assessment: Needs assistance Sitting-balance support: No upper extremity supported;Feet supported Sitting balance-Leahy Scale: Good Sitting balance - Comments: static sitting balance, distant sup/mod indep                                     Pertinent Vitals/Pain Pain Assessment: Faces Faces Pain Scale: Hurts little more Pain Location: R flank Pain Descriptors / Indicators: Aching;Guarding Pain Intervention(s): Limited activity within patient's tolerance;Monitored during session;Repositioned    Home Living Family/patient expects to be discharged to:: Private residence Living Arrangements: Spouse/significant other Available Help at Discharge: Family Type of Home: House Home Access: Ramped entrance     Home Layout: One level Home Equipment: None      Prior Function Level of Independence: Independent         Comments: Indep with  ADLs, household and community mobility without assist device; home O2 at night; disabled; denies recent fall history.     Hand Dominance        Extremity/Trunk Assessment   Upper Extremity Assessment Upper Extremity Assessment: Overall WFL for tasks assessed    Lower Extremity Assessment Lower Extremity Assessment:  Generalized weakness (grossly at least 4-/5 throughout)       Communication   Communication: No difficulties  Cognition Arousal/Alertness: Awake/alert Behavior During Therapy: WFL for tasks assessed/performed Overall Cognitive Status: Within Functional Limits for tasks assessed                                        General Comments      Exercises     Assessment/Plan    PT Assessment Patient needs continued PT services  PT Problem List Decreased strength;Decreased range of motion;Decreased activity tolerance;Decreased balance;Decreased mobility;Cardiopulmonary status limiting activity       PT Treatment Interventions DME instruction;Therapeutic exercise;Gait training;Functional mobility training;Therapeutic activities;Balance training;Patient/family education    PT Goals (Current goals can be found in the Care Plan section)  Acute Rehab PT Goals Patient Stated Goal: to move around a little bit PT Goal Formulation: With patient/family Time For Goal Achievement: 04/22/17 Potential to Achieve Goals: Good    Frequency Min 2X/week   Barriers to discharge        Co-evaluation               AM-PAC PT "6 Clicks" Daily Activity  Outcome Measure Difficulty turning over in bed (including adjusting bedclothes, sheets and blankets)?: None Difficulty moving from lying on back to sitting on the side of the bed? : None Difficulty sitting down on and standing up from a chair with arms (e.g., wheelchair, bedside commode, etc,.)?: A Little Help needed moving to and from a bed to chair (including a wheelchair)?: A Little Help needed walking in hospital room?: A Little Help needed climbing 3-5 steps with a railing? : A Little 6 Click Score: 20    End of Session Equipment Utilized During Treatment: Oxygen Activity Tolerance: Treatment limited secondary to medical complications (Comment) (decrease in O2 sats with transition to upright, initiation of exertional  activity) Patient left: in bed;with call bell/phone within reach;with family/visitor present Nurse Communication: Mobility status PT Visit Diagnosis: Muscle weakness (generalized) (M62.81);Difficulty in walking, not elsewhere classified (R26.2)    Time: 9518-8416 PT Time Calculation (min) (ACUTE ONLY): 17 min   Charges:   PT Evaluation $PT Eval High Complexity: 1 High     PT G Codes:   PT G-Codes **NOT FOR INPATIENT CLASS** Functional Assessment Tool Used: AM-PAC 6 Clicks Basic Mobility Functional Limitation: Mobility: Walking and moving around Mobility: Walking and Moving Around Current Status (S0630): At least 40 percent but less than 60 percent impaired, limited or restricted Mobility: Walking and Moving Around Goal Status [phone]): At least 1 percent but less than 20 percent impaired, limited or restricted    Savera Donson H. Owens Shark, PT, DPT, NCS 04/08/17, 3:59 PM [phone]

## 2017-04-09 DIAGNOSIS — R1310 Dysphagia, unspecified: Secondary | ICD-10-CM

## 2017-04-09 DIAGNOSIS — R1319 Other dysphagia: Secondary | ICD-10-CM

## 2017-04-09 DIAGNOSIS — Z515 Encounter for palliative care: Secondary | ICD-10-CM

## 2017-04-09 LAB — GLUCOSE, CAPILLARY: GLUCOSE-CAPILLARY: 147 mg/dL — AB (ref 65–99)

## 2017-04-09 MED ORDER — KETOROLAC TROMETHAMINE 15 MG/ML IJ SOLN
15.0000 mg | Freq: Four times a day (QID) | INTRAMUSCULAR | Status: DC | PRN
  Administered 2017-04-09 – 2017-04-11 (×3): 15 mg via INTRAVENOUS
  Filled 2017-04-09 (×4): qty 1

## 2017-04-09 NOTE — Progress Notes (Signed)
Nutrition Follow-up  DOCUMENTATION CODES:   Severe malnutrition in context of chronic illness  INTERVENTION:  Continue Magic cup TID with meals, each supplement provides 290 kcal and 9 grams of protein.  Continue Mighty Shake II BID with lunch and dinner, each supplement provides 480-500 kcals and 20-23 grams of protein.  Continue daily multivitamin with minerals.  Will monitor outcome of discussions regarding goals of care.  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  Ongoing.  GOAL:   Patient will meet greater than or equal to 90% of their needs  Progressing.  MONITOR:   PO intake, Supplement acceptance, Labs, Weight trends, I & O's  REASON FOR ASSESSMENT:   Consult  (NPO due to dysphagia)  ASSESSMENT:   61 year old male with PMHx of tonsillar cancer s/p tonsillectomy and XRT in 2009, hx of aspiration PNA, hx PEG tube (10/22/2012-05/2016), gastrocutaneous fistula following PEG tube removal, peripheral neuropathy, memory deficit, COPD who presented with SOB, pain in ribs and right flank, and weight loss found to have severe sepsis with septic shock, PNA, lactic acidosis, AKI.  Spoke with patient at bedside. He reports his appetite is still not great, but he is tolerating his diet well. He reports he is doing well with dysphagia 2 diet and nectar-thick liquids. He is taking bites of meals in setting of early satiety, but reports he enjoys the [employer] and [employer]. He endorses occasional nausea and abdominal pain. Also endorses some constipation. Patient would like bacon with his meals. Discussed with SLP. Approval for intake of bacon will need to come from MD. However, as patient  is known to be aspirating and has refused placement of another G-tube, soft bacon (not crispy) can be eaten for comfort if approved by MD.  Paged MD regarding this. Now pending PMT consult as patient remains full code.  Meal Completion: bites of meals; he was sent one Mighty Shake II, which he drank and he eats Magic Cup at each meal In the past 24 hrs patient has had approximately 1370 kcal (71% minimum estimated kcal needs) and 50 grams of protein (53% minimum estimated protein needs).  Medications reviewed and include: MVI daily, pantoprazole, senna, ceftriaxone.  Labs reviewed: CBG 147. On 8/22 CO2 33, BUN 22.  I/O: 320 ml UOP yesterday plus 2 occurrences unmeasured UOP  Weight trend: 57.1 kg on 8/23 (-1.2 kg from 8/21)  Discussed with RN. Patient also discussed on rounds.  Diet Order:  DIET DYS 2 Room service appropriate? Yes; Fluid consistency: Nectar Thick  Skin:  Reviewed, no issues  Last BM:  Unknown  Height:   Ht Readings from Last 1 Encounters:  04/06/17 6' (1.829 m)    Weight:   Wt Readings from Last 1 Encounters:  04/09/17 125 lb 14.1 oz (57.1 kg)    Ideal Body Weight:  80.9 kg  BMI:  Body mass index is 17.07 kg/m.  Estimated Nutritional Needs:   Kcal:  2778-2423 (MSJ x 1.3-1.5)  Protein:  95-108 grams (1.5-1.7 grams/kg)  Fluid:  1.9-2.2 L/day (30-35 ml/kg)  EDUCATION NEEDS:   Education needs addressed  Willey Blade, MS, RD, LDN Pager: [phone] After Hours Pager: [phone]

## 2017-04-09 NOTE — Progress Notes (Signed)
[facility] [place] NAME: Mark [name]    MR#:  [id number]  DATE OF BIRTH:  07/12/1956  SUBJECTIVE:  CHIEF COMPLAINT:   Chief Complaint  Patient presents with  . Shortness of Breath   Patient is 61 year old Caucasian male with history of squamous cell carcinoma head and neck, status post surgery, chemotherapy, radiation therapy in 2009, history of dysphagia, previous G-tube, which is now removed, recurrent aspiration pneumonitis, who presents to the hospital with complaints of respiratory failure, oxygen saturations to 70s, hypertension. Chest x-ray was consistent with multifocal pneumonia. He was admitted to the hospital, now on high flow oxygen via nasal cannulas, he feels relatively good, admits of some cough but no sputum production. His blood pressure Has somewhat improved on IV fluids. Sputum culture revealed Klebsiella. Antibiotic is being changed from Zosyn to Rocephin intravenously. The patient feels better today, complains of some chest pain on the right side with cough Repeat the chest is a revealed persistent changes of pneumonia. The patient was seen by speech pathologist, recommended dysphagia 2 diet, which he tolerates. Patient requests bacon. Palliative care consultation is requested to discuss implications of diet/aspiration to Airways Oxygen requirements are down, now off high flow oxygen, on 4 L of oxygen through nasal cannula  Review of Systems  Constitutional: Negative for chills, fever and weight loss.  HENT: Negative for congestion.   Eyes: Negative for blurred vision and double vision.  Respiratory: Positive for cough. Negative for sputum production.   Cardiovascular: Negative for chest pain, palpitations, orthopnea, leg swelling and PND.  Gastrointestinal: Negative for abdominal pain, blood in stool, constipation, diarrhea, nausea and vomiting.  Genitourinary: Negative for dysuria, frequency, hematuria and  urgency.  Musculoskeletal: Negative for falls.  Neurological: Negative for dizziness, tremors, focal weakness and headaches.  Endo/Heme/Allergies: Does not bruise/bleed easily.  Psychiatric/Behavioral: Negative for depression. The patient does not have insomnia.     VITAL SIGNS: Blood pressure 120/62, pulse 73, temperature 98.8 F (37.1 C), temperature source Oral, resp. rate (!) 25, height 6' (1.829 m), weight 57.1 kg (125 lb 14.1 oz), SpO2 98 %.  PHYSICAL EXAMINATION:   GENERAL:  61 y.o.-year-old patient sitting in the bed in significant respiratory distress, much more alert and comfortable  EYES: Pupils equal, round, reactive to light and accommodation. No scleral icterus. Extraocular muscles intact.  HEENT: Head atraumatic, normocephalic. Oropharynx and nasopharynx clear. Dry oral mucosa, dry secretions in the mouth NECK:  Supple, no jugular venous distention. No thyroid enlargement, no tenderness.  LUNGSBetter air entrance bilaterally, no wheezing, scattered rales,rhonchi and crepitation, more on the right side anteriorly.   intermittent use of accessory muscles of respiration.  CARDIOVASCULAR: S1, S2 normal. No murmurs, rubs, or gallops.  ABDOMEN: Soft, nontender, nondistended. Bowel sounds present. No organomegaly or mass.  EXTREMITIES: No pedal edema, cyanosis, or clubbing.  NEUROLOGIC: Cranial nerves II through XII are intact. Muscle strength 5/5 in all extremities. Sensation intact. Gait not checked.  PSYCHIATRIC: The patient ialert and oriented 3 SKIN: No obvious rash, lesion, or ulcer.   ORDERS/RESULTS REVIEWED:   CBC  Recent Labs Lab 04/06/17 0731 04/07/17 0343 04/08/17 0503  WBC 6.2 9.5 8.4  HGB 14.2 12.3* 11.4*  HCT 42.5 36.9* 33.0*  PLT 154 125* 112*  MCV 87.9 88.6 88.5  MCH 29.4 29.4 30.5  MCHC 33.4 33.2 34.5  RDW 16.2* 16.5* 16.3*  LYMPHSABS 0.3*  --   --   MONOABS 0.3  --   --  EOSABS 0.0  --   --   BASOSABS 0.0  --   --     ------------------------------------------------------------------------------------------------------------------  Chemistries   Recent Labs Lab 04/06/17 0731 04/07/17 0343 04/08/17 0503  NA 139 143 142  K 4.1 4.0 3.6  CL 100* 102 102  CO2 28 31 33*  GLUCOSE 137* 178* 127*  BUN 44* 26* 22*  CREATININE 1.55* 0.79 0.62  CALCIUM 8.8* 8.8* 8.8*  MG 1.8  --   --   AST 40  --   --   ALT 16*  --   --   ALKPHOS 64  --   --   BILITOT 0.8  --   --    ------------------------------------------------------------------------------------------------------------------ estimated creatinine clearance is 78.3 mL/min (by C-G formula based on SCr of 0.62 mg/dL). ------------------------------------------------------------------------------------------------------------------ No results for input(s): TSH, T4TOTAL, T3FREE, THYROIDAB in the last 72 hours.  Invalid input(s): FREET3  Cardiac Enzymes  Recent Labs Lab 04/06/17 0731 04/06/17 1058  TROPONINI <0.03 <0.03   ------------------------------------------------------------------------------------------------------------------ Invalid input(s): POCBNP ---------------------------------------------------------------------------------------------------------------  RADIOLOGY: Dg Chest Port 1 View  Result Date: 04/08/2017 CLINICAL DATA:  Respiratory failure. EXAM: PORTABLE CHEST 1 VIEW COMPARISON:  04/06/2017. FINDINGS: Mediastinum stable. Heart size stable. Persistent multifocal bilateral pulmonary infiltrates, unchanged from prior exam. Findings most consistent pneumonia. Continued follow-up exam suggested to demonstrate clearing. Low lung volumes. COPD. No pleural effusion or pneumothorax. IMPRESSION: 1. Persistent multifocal bilateral pulmonary infiltrates without significant interim change. Low lung volumes. Continued follow-up chest x-rays to demonstrate clearing suggested. 2.  COPD. Electronically Signed   By: Marcello Moores  Register   On:  04/08/2017 06:15    EKG:  Orders placed or performed during the hospital encounter of 04/06/17  . ED EKG 12-Lead  . ED EKG 12-Lead    ASSESSMENT AND PLAN:  Active Problems:   Severe sepsis with septic shock (HCC)   Multifocal pneumonia   Lactic acidosis   Acute respiratory failure with hypoxia ([place])   Septic shock (HCC)  #1. Severe sepsis with septic shock, blood cultures are negative , sputum cultures revealed Klebsiella pneumonia, Beta hemolytic, not group a Streptococcus, MRSA PCR was negative, continue Rocephin intravenously, clinically improved, now off high flow oxygen, on 4 L of oxygen through nasal cannula, continue oxygen therapy as needed, patient is being transferred to the regular medical floor #2 Multifocal pneumoniadue to Klebsiella pneumonia. Continue Rocephin , oxygen therapy, clinically improved, oxygen requirements improved, weaning off oxygen as tolerated #3. Lactic acidosis, off IV fluids , resolved #4. Respiratory failure with hypoxia, now off high flow oxygen,  weaning off oxygen , on 4 L now #5 acute renal insufficiency, resolved with IV fluids  #6. Mild anemia, thrombocytopenia, s follow with therapy #7. Dysphagia, speech therapist consultation is appreciated, , continue dysphagia 2 diet, tolerates it well, may need modified barium swallowing study. Patient requested bacon today, getting palliative care involved to discuss implications/risks of eating advance diet    . Treatment plans discussed with the patient, family and they are in agreement.   DRUG ALLERGIES:  Allergies  Allergen Reactions  . Morphine And Related Nausea And Vomiting    CODE STATUS:     Code Status Orders        Start     Ordered   04/06/17 0917  Full code  Continuous     04/06/17 0917    Code Status History    Date Active Date Inactive Code Status Order ID Comments User Context   04/23/2015  1:01 PM 04/25/2015  5:57  PM Full Code [id number]  Loletha Grayer, MD ED   11/18/2014   5:52 PM 11/21/2014  3:04 PM Full Code [id number]  Nita Sells, MD Inpatient      TOTAL TIME TAKING CARE OF THIS PATIENT: 35 minutes.    Theodoro Grist M.D on 04/09/2017 at 11:50 AM  Between 7am to 6pm - Pager - [phone]  After 6pm go to [email or web address] - password [email or web address] [facility]  Office  [phone]  CC: Primary care physician; Secundino Ginger, PA-C

## 2017-04-09 NOTE — Progress Notes (Signed)
[facility] responded to an OR for an AD. [place] educated Pt and wife on the document. Pt and wife will review and seek to complete sometime on 04/10/17. Pt was not in need of spiritual care at this time.    04/09/17 1700  Clinical Encounter Type  Visited With Patient;Patient and family together  Visit Type Initial;Spiritual support  Referral From Nurse  Consult/Referral To Chaplain  Spiritual Encounters  Spiritual Needs Literature

## 2017-04-09 NOTE — Progress Notes (Signed)
Continues to have rattling cough and complains of pleuritic chest pain denies shortness of breath on 4L O2 via nasal canula.  Vitals:   04/09/17 0200 04/09/17 0300 04/09/17 0500 04/09/17 0600  BP: 106/60   122/76  Pulse: 75 83  90  Resp: (!) 29   (!) 23  Temp:      TempSrc:      SpO2: 96% 95%  98%  Weight:   57.1 kg (125 lb 14.1 oz)   Height:       NAD at rest No JVD Diminished with mild rhonchi throughout, even, non labored Regular, no M Abdomen soft, NT, NABS Extremities without edema  BMP Latest Ref Rng & Units 04/08/2017 04/07/2017 04/06/2017  Glucose 65 - 99 mg/dL 127(H) 178(H) 137(H)  BUN 6 - 20 mg/dL 22(H) 26(H) 44(H)  Creatinine 0.61 - 1.24 mg/dL 0.62 0.79 1.55(H)  Sodium 135 - 145 mmol/L 142 143 139  Potassium 3.5 - 5.1 mmol/L 3.6 4.0 4.1  Chloride 101 - 111 mmol/L 102 102 100(L)  CO2 22 - 32 mmol/L 33(H) 31 28  Calcium 8.9 - 10.3 mg/dL 8.8(L) 8.8(L) 8.8(L)    CBC Latest Ref Rng & Units 04/08/2017 04/07/2017 04/06/2017  WBC 3.8 - 10.6 K/uL 8.4 9.5 6.2  Hemoglobin 13.0 - 18.0 g/dL 11.4(L) 12.3(L) 14.2  Hematocrit 40.0 - 52.0 % 33.0(L) 36.9(L) 42.5  Platelets 150 - 440 K/uL 112(L) 125(L) 154    Sputum culture: Klebsiella CXR: Slight increase in right lower lobe opacity, small left lower lobe opacity  ASSESSMENT / PLAN: Acute hypoxemic respiratory failure Aspiration pneumonia - recurrent. Due to xerostomia and dysphagia after treatment of head and neck cancer Pleuritic Chest Pain  AKI - resolved Severe sepsis - resolved  Continue supplemental oxygen to maintain SPO2 >90% Continue ceftriaxone start date 08/22-complete 7-10 days Continue current diet and frequent OOB to chair  Will add prn toradol for pleuritic chest pain Transfer to MedSurg After transfer, PCCM will sign off. Please call if we can be of further assistance   Marda Stalker, [place] Pager [phone] (please enter [phone]) [place] Pager [phone] (please enter [phone]  digits)  Merton Border, MD PCCM service Mobile [phone] Pager [phone] 04/09/2017 12:14 PM

## 2017-04-09 NOTE — Consult Note (Signed)
Consultation Note Date: 04/09/2017   Patient Name: Mark [name]  DOB: 01-14-1956  MRN: [id number]  Age / Sex: 61 y.o., male  PCP: Mark [name] Referring Physician: Theodoro Grist, MD  Reason for Consultation: Establishing goals of care  HPI/Patient Profile: 61 y.o. male  with past medical history of throat cancer, peripheral neuropath, and COPD who was admitted on 04/06/2017 with hypoxic respiratory failure due to multifocal pneumonia.  He has improved with treatment and is now on the floor.  PMT was consulted to discuss "bacon" and risks of aspiration.  Clinical Assessment and Goals of Care:  I have reviewed medical records including EPIC notes, labs and imaging, received report from the attending physician, assessed the patient and then met at the bedside along with his wife to discuss risks of aspiration as well as goals of care.  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  We talked about his "[employer]" - when would he prefer to be let go peacefully rather than continue to seek aggressive medical treatment.  He felt strongly that he did not want another PEG, but then re-considered that statement as his wife began to cry.  We discussed bacon.  Mark [name] states that he has no sense of taste or smell.  He likes to eat bacon because he enjoys the crunchy texture.  It is one of the few foods he enjoys.  I offered to put in a SLP eval to see if he could safely eat bacon.  He was very agreeable.  We discussed advanced directives.  He states he would not want resuscitation if he was near end of life in the situations described (incurable illness, advanced dementia, or unconscious).  I left the paper work with him and placed a Chaplain consult for witness and notary.   Primary Decision Maker:  PATIENT, surrogate  decision maker is his wife.    SUMMARY OF RECOMMENDATIONS    SLP eval for bacon Advanced directives discussed and paper work left for consideration.  Chaplain order placed for notary. PT eval request. Patient to ambulate in the hallways as tolerated.  Code Status/Advance Care Planning:  Full code    Additional Recommendations (Limitations, Scope, Preferences):  Full Scope Treatment    Prognosis:   Unable to determine    Discharge Planning: Home with Home Health      Primary Diagnoses: Present on Admission: . Septic shock ([place])   I have reviewed the medical record, interviewed the patient and family, and examined the patient. The following aspects are pertinent.  Past Medical History:  Diagnosis Date  . Arthritis    left hand  . Aspiration pneumonia ([place])   . Cancer ([place])    tonsilal ca  . COPD (chronic obstructive pulmonary disease) ([place])   . Incontinence    night time, began recently  . Memory deficit 11/18/2014  . PEG tube 11/18/2014   removed summer 2017  . Peripheral  neuropathy 11/18/2014  . Smokers' cough ([place])   . Tonsillar cancer, S/P surgery, chemotherapy XRT 2009-followed at [facility] 11/18/2014  . Wears dentures    full upper and lower   Social History   Social History  . Marital status: Married    Spouse name: N/A  . Number of children: N/A  . Years of education: N/A   Social History Main Topics  . Smoking status: Current Every Day Smoker    Years: 40.00  . Smokeless tobacco: Never Used     Comment: was 2 PPD, now 2 cigs/day  . Alcohol use No  . Drug use: No  . Sexual activity: Not Asked   Other Topics Concern  . 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 Liver cancer Mother   . Stroke Father   . Testicular cancer Father    Scheduled Meds: . ARIPiprazole  2 mg Oral Daily  . budesonide  (PULMICORT) nebulizer solution  0.25 mg Nebulization BID  . DULoxetine  60 mg Oral Daily  . enoxaparin (LOVENOX) injection  40 mg Subcutaneous Q24H  . fluticasone  1 spray Each Nare Daily  . ipratropium-albuterol  3 mL Nebulization Q6H  . mouth rinse  15 mL Mouth Rinse BID  . multivitamin with minerals  1 tablet Oral Daily  . pantoprazole  40 mg Oral QHS  . senna-docusate  1 tablet Oral BID  . traZODone  150 mg Oral QHS  . venlafaxine  37.5 mg Oral BID WC   Continuous Infusions: . cefTRIAXone (ROCEPHIN)  IV Stopped (04/09/17 1250)   PRN Meds:.acetaminophen **OR** [DISCONTINUED] acetaminophen, albuterol, clonazePAM, ketorolac, [DISCONTINUED] ondansetron **OR** ondansetron (ZOFRAN) IV, oxyCODONE Allergies  Allergen Reactions  . Morphine And Related Nausea And Vomiting   Review of Systems slight constipation, dyspnea  Physical Exam  Thin, well developed man, A&O, coherent, appropriate, cooperative.   Vital Signs: BP 118/83 (BP Location: Right Arm)   Pulse 63   Temp 98.8 F (37.1 C) (Oral)   Resp 16   Ht 6' (1.829 m)   Wt 58.5 kg (128 lb 14.4 oz)   SpO2 96%   BMI 17.48 kg/m  Pain Assessment: 0-10 POSS *See Group Information*: 1-Acceptable,Awake and alert Pain Score: 10-Worst pain ever   SpO2: SpO2: 96 % O2 Device:SpO2: 96 % O2 Flow Rate: .O2 Flow Rate (L/min): 4 L/min  IO: Intake/output summary:  Intake/Output Summary (Last 24 hours) at 04/09/17 1605 Last data filed at 04/09/17 0849  Gross per 24 hour  Intake               50 ml  Output              570 ml  Net             -520 ml    LBM: Last BM Date: 04/02/17 Baseline Weight: Weight: 63.5 kg (140 lb) Most recent weight: Weight: 58.5 kg (128 lb 14.4 oz)     Palliative Assessment/Data:   Flowsheet Rows     Most Recent Value  Intake Tab  Referral Department  Hospitalist  Unit at Time of Referral  Med/Surg Unit  Palliative Care Primary Diagnosis  Cancer  Date Notified  04/09/17  Palliative Care Type  New  Palliative care  Reason for referral  Psychosocial or Spiritual support, Clarify Goals of Care, Advance  Care Planning  Date of Admission  04/06/17  Date first seen by Palliative Care  04/09/17  # of days Palliative referral response time  0 Day(s)  # of days IP prior to Palliative referral  3  Clinical Assessment  Palliative Performance Scale Score  60%  Psychosocial & Spiritual Assessment  Palliative Care Outcomes  Patient/Family meeting held?  Yes  Who was at the meeting?  wife, patient, and friend  Palliative Care Outcomes  Clarified goals of care, ACP counseling assistance, Provided psychosocial or spiritual support      Time In: 3:30 Time Out: 4:00 Time Total: 30 min. Greater than 50%  of this time was spent counseling and coordinating care related to the above assessment and plan.  Signed by: Florentina Jenny, PA-C Palliative Medicine Pager: [phone]  Please contact Palliative Medicine Team phone at [phone] for questions and concerns.  For individual provider: See Shea Evans

## 2017-04-09 NOTE — Progress Notes (Signed)
Patient alert. Titrated oxygen down to 4 liters of oxygen this am. Complaints of pain right ribs and legs/feet patient states from neuropathy. Tolerating diet and using urinal. Patient being transferred to floor.

## 2017-04-10 MED ORDER — HYDROCOD POLST-CPM POLST ER 10-8 MG/5ML PO SUER
5.0000 mL | Freq: Two times a day (BID) | ORAL | Status: DC | PRN
  Administered 2017-04-10 – 2017-04-11 (×3): 5 mL via ORAL
  Filled 2017-04-10 (×3): qty 5

## 2017-04-10 NOTE — Progress Notes (Signed)
[place] [facility] [place] NAME: Mark [name]    MR#:  [id number]  DATE OF BIRTH:  01-May-1956  SUBJECTIVE:  CHIEF COMPLAINT:   Chief Complaint  Patient presents with  . Shortness of Breath   Patient is 61 year old Caucasian male with history of squamous cell carcinoma head and neck, status post surgery, chemotherapy, radiation therapy in 2009, history of dysphagia, previous G-tube, which is now removed, recurrent aspiration pneumonitis, who presents to the hospital with complaints of respiratory failure, oxygen saturations to 70s, hypertension. Chest x-ray was consistent with multifocal pneumonia. He was admitted to the hospital, was on high flow oxygen, now on 4 liters via nasal cannulas, he feels relatively good, admits of some cough, some yellow  sputum production. Sputum culture revealed Klebsiella. Now on Rocephin intravenously. The patient feels better overall. The patient was seen by speech pathologist, recommended dysphagia 2 diet, which he tolerates. Palliative care consultation was requested to discuss implications of diet/aspiration to airways Oxygen requirements are down  Review of Systems  Constitutional: Negative for chills, fever and weight loss.  HENT: Negative for congestion.   Eyes: Negative for blurred vision and double vision.  Respiratory: Positive for cough. Negative for sputum production.   Cardiovascular: Negative for chest pain, palpitations, orthopnea, leg swelling and PND.  Gastrointestinal: Negative for abdominal pain, blood in stool, constipation, diarrhea, nausea and vomiting.  Genitourinary: Negative for dysuria, frequency, hematuria and urgency.  Musculoskeletal: Negative for falls.  Neurological: Negative for dizziness, tremors, focal weakness and headaches.  Endo/Heme/Allergies: Does not bruise/bleed easily.  Psychiatric/Behavioral: Negative for depression. The patient does not have insomnia.     VITAL  SIGNS: Blood pressure (!) 116/59, pulse 92, temperature 98.7 F (37.1 C), temperature source Oral, resp. rate 19, height 6' (1.829 m), weight 57.9 kg (127 lb 9.6 oz), SpO2 92 %.  PHYSICAL EXAMINATION:   GENERAL:  61 y.o.-year-old patient sitting in the bed in significant respiratory distress, much more alert and comfortable  EYES: Pupils equal, round, reactive to light and accommodation. No scleral icterus. Extraocular muscles intact.  HEENT: Head atraumatic, normocephalic. Oropharynx and nasopharynx clear. Dry oral mucosa, dry secretions in the mouth NECK:  Supple, no jugular venous distention. No thyroid enlargement, no tenderness.  LUNGSBetter air entrance bilaterally, no wheezing, scattered rales,rhonchi and bilateral basilar crepitations on mid axillary line.   Intermittent use of accessory muscles of respiration.  CARDIOVASCULAR: S1, S2 normal. No murmurs, rubs, or gallops.  ABDOMEN: Soft, nontender, nondistended. Bowel sounds present. No organomegaly or mass.  EXTREMITIES: No pedal edema, cyanosis, or clubbing.  NEUROLOGIC: Cranial nerves II through XII are intact. Muscle strength 5/5 in all extremities. Sensation intact. Gait not checked.  PSYCHIATRIC: The patient ialert and oriented 3 SKIN: No obvious rash, lesion, or ulcer.   ORDERS/RESULTS REVIEWED:   CBC  Recent Labs Lab 04/06/17 0731 04/07/17 0343 04/08/17 0503  WBC 6.2 9.5 8.4  HGB 14.2 12.3* 11.4*  HCT 42.5 36.9* 33.0*  PLT 154 125* 112*  MCV 87.9 88.6 88.5  MCH 29.4 29.4 30.5  MCHC 33.4 33.2 34.5  RDW 16.2* 16.5* 16.3*  LYMPHSABS 0.3*  --   --   MONOABS 0.3  --   --   EOSABS 0.0  --   --   BASOSABS 0.0  --   --    ------------------------------------------------------------------------------------------------------------------  Chemistries   Recent Labs Lab 04/06/17 0731 04/07/17 0343 04/08/17 0503  NA 139 143 142  K 4.1 4.0 3.6  CL 100* 102 102  CO2 28 31 33*  GLUCOSE 137* 178* 127*  BUN 44* 26*  22*  CREATININE 1.55* 0.79 0.62  CALCIUM 8.8* 8.8* 8.8*  MG 1.8  --   --   AST 40  --   --   ALT 16*  --   --   ALKPHOS 64  --   --   BILITOT 0.8  --   --    ------------------------------------------------------------------------------------------------------------------ estimated creatinine clearance is 79.4 mL/min (by C-G formula based on SCr of 0.62 mg/dL). ------------------------------------------------------------------------------------------------------------------ No results for input(s): TSH, T4TOTAL, T3FREE, THYROIDAB in the last 72 hours.  Invalid input(s): FREET3  Cardiac Enzymes  Recent Labs Lab 04/06/17 0731 04/06/17 1058  TROPONINI <0.03 <0.03   ------------------------------------------------------------------------------------------------------------------ Invalid input(s): POCBNP ---------------------------------------------------------------------------------------------------------------  RADIOLOGY: No results found.  EKG:  Orders placed or performed during the hospital encounter of 04/06/17  . ED EKG 12-Lead  . ED EKG 12-Lead    ASSESSMENT AND PLAN:  Active Problems:   Severe sepsis with septic shock (HCC)   Multifocal pneumonia   Lactic acidosis   Acute respiratory failure with hypoxia (HCC)   Septic shock (H[facility])   Esophageal dysphagia   Palliative care encounter  #1. Severe sepsis with septic shock, blood cultures are negative , sputum cultures revealed Klebsiella pneumonia, Beta hemolytic, not group a Streptococcus, MRSA PCR was negative, continue Rocephin intravenously, clinically improved, now off high flow oxygen, on 3-4 L of oxygen through nasal cannula,  wean off oxygen therapy as tolerated #2 Multifocal pneumoniadue to Klebsiella pneumonia, Beta hemolytic, not group a Streptococcus. Continue Rocephin , oxygen therapy, clinically improved, oxygen requirements improved, weaning off oxygen as tolerated #3. Lactic acidosis, off IV fluids ,  resolved #4.  Acute respiratory failure with hypoxia, now off high flow oxygen,  weaning off oxygen , on 3-4 L now #5 acute renal insufficiency, resolved with IV fluids  #6. Mild anemia, thrombocytopenia, s follow with therapy #7. Dysphagia, speech therapist consultation is appreciated, , continue dysphagia 2 diet, tolerates it well. Appreciate palliative care input, advance directives signed    . Treatment plans discussed with the patient, family and they are in agreement.   DRUG ALLERGIES:  Allergies  Allergen Reactions  . Morphine And Related Nausea And Vomiting    CODE STATUS:     Code Status Orders        Start     Ordered   04/06/17 0917  Full code  Continuous     04/06/17 0917    Code Status History    Date Active Date Inactive Code Status Order ID Comments User Context   04/23/2015  1:01 PM 04/25/2015  5:57 PM Full Code [id number]  Loletha Grayer, MD ED   11/18/2014  5:52 PM 11/21/2014  3:04 PM Full Code [id number]  Nita Sells, MD Inpatient      TOTAL TIME TAKING CARE OF THIS PATIENT: 35 minutes.    Theodoro Grist M.D on 04/10/2017 at 11:49 AM  Between 7am to 6pm - Pager - [phone]  After 6pm go to [email or web address] - password [email or web address] [facility]  Office  [phone]  CC: Primary care physician; Secundino Ginger, PA-C

## 2017-04-10 NOTE — Progress Notes (Signed)
  Speech Language Pathology Treatment: Dysphagia  Patient Details Name: Mark [name] MRN: [id number] DOB: 04-08-1956 Today's Date: 04/10/2017 Time: 2876-8115 SLP Time Calculation (min) (ACUTE ONLY): 60 min  Assessment / Plan / Recommendation Clinical Impression  Met w/ pt and wife today for session re: education on aspiration precautions; diet consistency; handouts on ordering thickened liquids/products. Pt has been seen by this service for MBSSs and tx for ~4 years now. He did have a PEG d/t the severity of dysphagia; aspiration. This was removed ~8 months ago per pt/wife and pt has been taking an oral diet per his request; he has stated he does not want another PEG placed. Pt stated he has been eating and drinking orally at home and "managing". He feels he knows certain foods and drinks he "does best with". Time spent on education; diet consistency of certain foods; products and ordering information for the diet he is on currently. Pt and wife agreeable to the education and suggestions and seemed pleased w/ the information for home use; both agreed that further testing was not needed at this time. Thickened products/suggestions given for take home at discharge. No further skilled ST services indicated at this time. NSG to reconsult ST services if any further education is needed while admitted. Pt/wife agreed. NSG present and updated.    HPI HPI: 61 year old male admitted 04/06/17 with shortness of breath and fatigue. 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 Pt and wife stated understanding of pt's risk for aspiration; potential ongoing aspiration w/ oral intake. Pt has been eating/drinking more of a puree/soft foods diet w/ Nectar liquids at home, but pt endorsed drinking "regular water" (thin) recently - pt was admitted w/ respiratory failure, oxygen saturations to 70s, hypertension. Chest  x-ray was consistent with multifocal pneumonia this admission and is on increased o2 support. Pt has declined wanting another PEG placed.       SLP Plan  All goals met       Recommendations  Diet recommendations:  (continue w/ current diet ordered by MD; pt request) Medication Administration: Whole meds with puree Supervision: Patient able to self feed Compensations: Minimize environmental distractions;Slow rate;Small sips/bites;Lingual sweep for clearance of pocketing;Multiple dry swallows after each bite/sip;Follow solids with liquid;Clear throat intermittently;Effortful swallow Postural Changes and/or Swallow Maneuvers: Seated upright 90 degrees;Upright 30-60 min after meal                General recommendations:  (Dietician f/u as needed) Oral Care Recommendations: Oral care before and after PO;Patient independent with oral care Follow up Recommendations: None SLP Visit Diagnosis: Dysphagia, pharyngoesophageal phase (R13.14) Plan: All goals met       GO                Orinda Kenner, MS, CCC-SLP Jenasia Dolinar 04/10/2017, 2:29 PM

## 2017-04-10 NOTE — Progress Notes (Signed)
Physical Therapy Treatment Patient Details Name: Mark [name] MRN: [id number] DOB: 12/05/1955 Today's Date: 04/10/2017    History of Present Illness presented to ER secondary to SOB, R flank pain, noted to be hypoxic, hypotensive; admitted with sepsis with septic shock related to multifocal PNA (with bilat infiltrates noted).  Initially requiring HFNC, has since been weaned to [facility].  PMH significant for head/neck cancer with previous PEG placement (removed 2017), COPD    PT Comments    Mark [name] has made excellent progress.  He continues to require 2L O2 with any mobility (see general comments below) and pt educated on energy conservation techniques.  He demonstrates instability with SLS and tandem stance as well as instability while ambulating, thus recommending OPPT at d/c.  Additionally, pt would likely benefit from Pulmonary rehab as this is pt's main limiting factor.       Follow Up Recommendations  Outpatient PT     Equipment Recommendations  None recommended by PT    Recommendations for Other Services Other (comment) (Pulmonary Rehab)     Precautions / Restrictions Precautions Precautions: Fall;Other (comment) Precaution Comments: Monitor O2 Restrictions Weight Bearing Restrictions: No    Mobility  Bed Mobility Overal bed mobility: Independent             General bed mobility comments: Pt performs independently.  No physical assist or cues needed.  Transfers Overall transfer level: Modified independent Equipment used: None             General transfer comment: Very mild instability with sit>stand but no LOB and no physical assist or cues needed.  Ambulation/Gait Ambulation/Gait assistance: Min guard Ambulation Distance (Feet): 100 Feet Assistive device: None Gait Pattern/deviations: Step-through pattern;Narrow base of support   Gait velocity interpretation: at or above normal speed for age/gender General Gait Details: Pt ambulates with narrow BOS and  staggers L/R on occasion but no LOB.  SpO2 drops as low as 86% on 2L O2.   Stairs            Wheelchair Mobility    Modified Rankin (Stroke Patients Only)       Balance Overall balance assessment: Needs assistance Sitting-balance support: No upper extremity supported;Feet supported Sitting balance-Leahy Scale: Good     Standing balance support: No upper extremity supported;During functional activity Standing balance-Leahy Scale: Fair Standing balance comment: Pt able to stand statically without UE support but would likely lose his balance with perturbation                 Standardized Balance Assessment Standardized Balance Assessment : Berg Balance Test Berg Balance Test Sit to Stand: Able to stand without using hands and stabilize independently Standing Unsupported: Able to stand safely 2 minutes Sitting with Back Unsupported but Feet Supported on Floor or Stool: Able to sit safely and securely 2 minutes Stand to Sit: Sits safely with minimal use of hands Transfers: Able to transfer safely, minor use of hands Standing Unsupported with Eyes Closed: Able to stand 10 seconds safely Standing Ubsupported with Feet Together: Able to place feet together independently and stand 1 minute safely From Standing, Reach Forward with Outstretched Arm: Can reach confidently >25 cm (10") From Standing Position, Pick up Object from Floor: Able to pick up shoe safely and easily From Standing Position, Turn to Look Behind Over each Shoulder: Looks behind from both sides and weight shifts well Turn 360 Degrees: Able to turn 360 degrees safely in 4 seconds or less Standing Unsupported, Alternately Place Feet  on Step/Stool: Able to stand independently and safely and complete 8 steps in 20 seconds Standing Unsupported, One Foot in Front: Able to plae foot ahead of the other independently and hold 30 seconds Standing on One Leg: Able to lift leg independently and hold 5-10 seconds Total  Score: 54        Cognition Arousal/Alertness: Awake/alert Behavior During Therapy: WFL for tasks assessed/performed Overall Cognitive Status: Within Functional Limits for tasks assessed                                        Exercises Other Exercises Other Exercises: Educated pt on the benefits of having chairs throughout the house to allow for rest breaks when needed Other Exercises: Pt encouraged to take rest breaks with activity rather than attempting to do everything at once Other Exercises: Provided information on the use of a pulse oximeter to monitor SpO2 at home.  Pt and wife appreciative. Other Exercises: Encouraged pt to ambulate in hallway at least 3x/day with nursing staff    General Comments General comments (skin integrity, edema, etc.): SpO2 dropped to 84% on RA with sit>stand and thus 2L O2 was donned (as pt was found).  SpO2 fluctuates between 86-90% on 2L O2 for remainder of session.  SpO2 up to 91% at end of session with pt resting comfortably in bed on 2L O2.      Pertinent Vitals/Pain Pain Assessment: No/denies pain Pain Intervention(s): Monitored during session    Home Living                      Prior Function            PT Goals (current goals can now be found in the care plan section) Acute Rehab PT Goals Patient Stated Goal:  to go home PT Goal Formulation: With patient Time For Goal Achievement: 04/22/17 Potential to Achieve Goals: Good Progress towards PT goals: Progressing toward goals    Frequency    Min 2X/week      PT Plan Discharge plan needs to be updated    Co-evaluation              AM-PAC PT "6 Clicks" Daily Activity  Outcome Measure  Difficulty turning over in bed (including adjusting bedclothes, sheets and blankets)?: None Difficulty moving from lying on back to sitting on the side of the bed? : None Difficulty sitting down on and standing up from a chair with arms (e.g., wheelchair, bedside  commode, etc,.)?: A Little Help needed moving to and from a bed to chair (including a wheelchair)?: A Little Help needed walking in hospital room?: A Little Help needed climbing 3-5 steps with a railing? : A Little 6 Click Score: 20    End of Session Equipment Utilized During Treatment: Gait belt;Oxygen Activity Tolerance: Patient tolerated treatment well;Treatment limited secondary to medical complications (Comment) (hypoxia) Patient left: in bed;with call bell/phone within reach;with bed alarm set;with family/visitor present Nurse Communication: Mobility status;Other (comment) (SpO2) PT Visit Diagnosis: Muscle weakness (generalized) (M62.81);Difficulty in walking, not elsewhere classified (R26.2)     Time: 5631-4970 PT Time Calculation (min) (ACUTE ONLY): 23 min  Charges:  $Gait Training: 8-22 mins $Therapeutic Activity: 8-22 mins                    G Codes:       Collie Siad PT, DPT  04/10/2017, 3:34 PM

## 2017-04-11 LAB — CULTURE, BLOOD (ROUTINE X 2)
Culture: NO GROWTH
Culture: NO GROWTH
SPECIAL REQUESTS: ADEQUATE
SPECIAL REQUESTS: ADEQUATE

## 2017-04-11 LAB — CREATININE, SERUM
Creatinine, Ser: 0.57 mg/dL — ABNORMAL LOW (ref 0.61–1.24)
GFR calc non Af Amer: >60 mL/min (ref >60)

## 2017-04-11 MED ORDER — GUAIFENESIN-DM 100-10 MG/5ML PO SYRP: 5.0000 mL | ORAL_SOLUTION | ORAL | 0 refills | Status: DC | PRN

## 2017-04-11 MED ORDER — LEVOFLOXACIN 500 MG PO TABS: 500.0000 mg | ORAL_TABLET | Freq: Every day | ORAL | 0 refills | Status: AC

## 2017-04-11 MED ORDER — AMOXICILLIN-POT CLAVULANATE 875-125 MG PO TABS: 1.0000 | ORAL_TABLET | Freq: Two times a day (BID) | ORAL | 0 refills | Status: DC

## 2017-04-11 MED ORDER — IPRATROPIUM-ALBUTEROL 0.5-2.5 (3) MG/3ML IN SOLN: 3.0000 mL | Freq: Two times a day (BID) | RESPIRATORY_TRACT | Status: DC

## 2017-04-11 NOTE — Progress Notes (Signed)
Patient given discharge teaching and paperwork regarding medications, diet, follow-up appointments and activity. Patient understanding verbalized. No complaints at this time. IV discontinued prior to leaving. Skin assessment as previously charted and vitals are stable; on chronic O2, personal portable tank. Patient being discharged to home. Caregiver/family present during discharge teaching. No further needs by Care Management. Prescriptions handed to patient.

## 2017-04-11 NOTE — Care Management Note (Signed)
Case Management Note  Patient Details  Name: STONY STEGMANN MRN: [id number] Date of Birth: Jul 12, 1956  Subjective/Objective:    Discussed discharge planning with Mr Cygan who states that he is improved and he has now decided to pursue outpatient PT.                 Action/Plan:   Expected Discharge Date:  04/11/17               Expected Discharge Plan:     In-House Referral:     Discharge planning Services     Post Acute Care Choice:    Choice offered to:     DME Arranged:    DME Agency:     HH Arranged:    HH Agency:     Status of Service:     If discussed at [employer], dates discussed:    Additional Comments:  Birt Reinoso A, RN 04/11/2017, 1:14 PM

## 2017-04-11 NOTE — Discharge Summary (Addendum)
[facility] [place] NAME: Mark [name]    MR#:  [id number]  DATE OF BIRTH:  23-Nov-1955  DATE OF ADMISSION:  04/06/2017 ADMITTING PHYSICIAN: Theodoro Grist, MD  DATE OF DISCHARGE: 04/11/2017  PRIMARY CARE PHYSICIAN: Secundino Ginger, PA-C    ADMISSION DIAGNOSIS:  Septic shock ([place]) [A41.9, R65.21] Multifocal pneumonia [J18.9]  DISCHARGE DIAGNOSIS:  Active Problems:   Severe sepsis with septic shock (HCC)   Multifocal pneumonia   Lactic acidosis   Acute respiratory failure with hypoxia (HCC)   Septic shock (HCC)   Esophageal dysphagia   Palliative care encounter   SECONDARY DIAGNOSIS:   Past Medical History:  Diagnosis Date  . Arthritis    left hand  . Aspiration pneumonia ([place])   . Cancer ([place])    tonsilal ca  . COPD (chronic obstructive pulmonary disease) ([place])   . Incontinence    night time, began recently  . Memory deficit 11/18/2014  . PEG tube 11/18/2014   removed summer 2017  . Peripheral neuropathy 11/18/2014  . Smokers' cough ([place])   . Tonsillar cancer, S/P surgery, chemotherapy XRT 2009-followed at [facility] 11/18/2014  . Wears dentures    full upper and lower    HOSPITAL COURSE:   61 year old male with past medical history of COPD, tonsillar cancer, history of previous aspiration pneumonia, neuropathy, osteoarthritis, depression who presented to the hospital due to shortness of breath.  1. Acute respiratory failure with hypoxia-this was secondary to pneumonia/aspiration pneumonia. -Initially patient was admitted to the intensive care unit on BiPAP and started on broad-spectrum antibiotics. Patient was started on IV vancomycin, Zosyn. Patient's respiratory status since admission is significantly improved. He has been weaned off the BiPAP. His sputum culture was positive for Klebsiella, and patient is being discharged on oral Levaquin  2. Pneumonia-this was secondary to Klebsiella and suspected to be aspiration pneumonia  given his history of oral cancer and dysphagia. Patient was seen by speech therapy and is currently on a dysphagia 2 diet with nectar thick liquids and is tolerating it well. He's currently afebrile and hemodynamically stable. On the hospital patient was treated with IV Zosyn and then narrowed down to IV ceftriaxone and now being discharged on Oral Levaquin  3. Sepsis-patient met criteria admission given elevated lactic acid, hypotension, chest x-ray findings suggestive of pneumonia. The source of patient's sepsis was aspiration pneumonia as mentioned. Patient's sputum cultures are positive for Klebsiella. Patient is being discharged on oral Levaquin as mentioned above. He is clinically afebrile and hemodynamically stable now.  4. COPD-no acute exacerbation. Patient will continue his albuterol inhaler and Combivent Respimat as needed.  5. GERD - pt. Will cont. Protonix.   6. Anxiety/Depression - pt. Will cont. His Klonopin, Ability, Trazdone, Effexor as stated below.   7. Chronic Pain - pt. Will cont. His Oxycodone.   DISCHARGE CONDITIONS:   Stable.   CONSULTS OBTAINED:    DRUG ALLERGIES:   Allergies  Allergen Reactions  . Morphine And Related Nausea And Vomiting    DISCHARGE MEDICATIONS:   Allergies as of 04/11/2017      Reactions   Morphine And Related Nausea And Vomiting      Medication List    STOP taking these medications   free water Soln     TAKE these medications   albuterol 108 (90 Base) MCG/ACT inhaler Commonly known as:  PROVENTIL HFA;VENTOLIN HFA Inhale 2 puffs into the lungs every 6 (six) hours as needed for wheezing or shortness  of breath.   ARIPiprazole 2 MG tablet Commonly known as:  ABILIFY Take 2 mg by mouth daily.   clonazePAM 1 MG tablet Commonly known as:  KLONOPIN Take 1 mg by mouth 2 (two) times daily.   DULoxetine 60 MG capsule Commonly known as:  CYMBALTA Take 60 mg by mouth daily.   guaiFENesin-dextromethorphan 100-10 MG/5ML  syrup Commonly known as:  ROBITUSSIN DM Take 5 mLs by mouth every 4 (four) hours as needed for cough.   Ipratropium-Albuterol 20-100 MCG/ACT Aers respimat Commonly known as:  COMBIVENT Inhale 2 puffs into the lungs 2 (two) times daily.   levofloxacin 500 MG tablet Commonly known as:  LEVAQUIN Take 1 tablet (500 mg total) by mouth daily.   mometasone 50 MCG/ACT nasal spray Commonly known as:  NASONEX Place 2 sprays into both nostrils 2 (two) times daily.   Oxycodone HCl 20 MG Tabs Take 20 mg by mouth 4 (four) times daily.   pantoprazole 40 MG tablet Commonly known as:  PROTONIX Take 40 mg by mouth at bedtime.   traZODone 150 MG tablet Commonly known as:  DESYREL Take 150 mg by mouth at bedtime.   venlafaxine 37.5 MG tablet Commonly known as:  EFFEXOR Take 37.5 mg by mouth 2 (two) times daily with a meal.            Discharge Care Instructions        Start     Ordered   04/11/17 0000  guaiFENesin-dextromethorphan (ROBITUSSIN DM) 100-10 MG/5ML syrup  Every 4 hours PRN     04/11/17 1255   04/11/17 0000  Activity as tolerated - No restrictions     04/11/17 1255   04/11/17 0000  Diet - low sodium heart healthy    Comments:  Dysphagia 2 with nectar thick liquids and strict aspiration Precautions.   04/11/17 1255   04/11/17 0000  levofloxacin (LEVAQUIN) 500 MG tablet  Daily     04/11/17 1426        DISCHARGE INSTRUCTIONS:   DIET:  Regular diet  Dysphagia II with nectar thick liquids and strict aspiration Precaution.   DISCHARGE CONDITION:  Stable  ACTIVITY:  Activity as tolerated  OXYGEN:  Home Oxygen: No.   Oxygen Delivery: room air  DISCHARGE LOCATION:  home   If you experience worsening of your admission symptoms, develop shortness of breath, life threatening emergency, suicidal or homicidal thoughts you must seek medical attention immediately by calling 911 or calling your MD immediately  if symptoms less severe.  You Must read complete  instructions/literature along with all the possible adverse reactions/side effects for all the Medicines you take and that have been prescribed to you. Take any new Medicines after you have completely understood and accpet all the possible adverse reactions/side effects.   Please note  You were cared for by a hospitalist during your hospital stay. If you have any questions about your discharge medications or the care you received while you were in the hospital after you are discharged, you can call the unit and asked to speak with the hospitalist on call if the hospitalist that took care of you is not available. Once you are discharged, your primary care physician will handle any further medical issues. Please note that NO REFILLS for any discharge medications will be authorized once you are discharged, as it is imperative that you return to your primary care physician (or establish a relationship with a primary care physician if you do not have one) for your aftercare  needs so that they can reassess your need for medications and monitor your lab values.     Today   No acute events overnight.  Shortness of breath overall improved since admission.  + cough.    VITAL SIGNS:  Blood pressure (!) 105/59, pulse 79, temperature 98.3 F (36.8 C), temperature source Oral, resp. rate 17, height 6' (1.829 m), weight 57.9 kg (127 lb 11.2 oz), SpO2 92 %.  I/O:    Intake/Output Summary (Last 24 hours) at 04/11/17 1426 Last data filed at 04/11/17 0800  Gross per 24 hour  Intake              120 ml  Output                0 ml  Net              120 ml    PHYSICAL EXAMINATION:  GENERAL:  61 y.o.-year-old patient lying in the bed in no acute distress.  EYES: Pupils equal, round, reactive to light and accommodation. No scleral icterus. Extraocular muscles intact.  HEENT: Head atraumatic, normocephalic. Oropharynx and nasopharynx clear.  NECK:  Supple, no jugular venous distention. No thyroid enlargement,  no tenderness.  LUNGS: Normal breath sounds bilaterally, no wheezing, rales,rhonchi. No use of accessory muscles of respiration.  CARDIOVASCULAR: S1, S2 normal. No murmurs, rubs, or gallops.  ABDOMEN: Soft, non-tender, non-distended. Bowel sounds present. No organomegaly or mass.  EXTREMITIES: No pedal edema, cyanosis, or clubbing.  NEUROLOGIC: Cranial nerves II through XII are intact. No focal motor or sensory defecits b/l.  PSYCHIATRIC: The patient is alert and oriented x 3.  SKIN: No obvious rash, lesion, or ulcer.   DATA REVIEW:   CBC  Recent Labs Lab 04/08/17 0503  WBC 8.4  HGB 11.4*  HCT 33.0*  PLT 112*    Chemistries   Recent Labs Lab 04/06/17 0731  04/08/17 0503 04/11/17 0450  NA 139  < > 142  --   K 4.1  < > 3.6  --   CL 100*  < > 102  --   CO2 28  < > 33*  --   GLUCOSE 137*  < > 127*  --   BUN 44*  < > 22*  --   CREATININE 1.55*  < > 0.62 0.57*  CALCIUM 8.8*  < > 8.8*  --   MG 1.8  --   --   --   AST 40  --   --   --   ALT 16*  --   --   --   ALKPHOS 64  --   --   --   BILITOT 0.8  --   --   --   < > = values in this interval not displayed.  Cardiac Enzymes  Recent Labs Lab 04/06/17 1058  TROPONINI <0.03    Microbiology Results  Results for orders placed or performed during the hospital encounter of 04/06/17  Blood Culture (routine x 2)     Status: None   Collection Time: 04/06/17  7:35 AM  Result Value Ref Range Status   Specimen Description BLOOD LEFT FA  Final   Special Requests   Final    BOTTLES DRAWN AEROBIC AND ANAEROBIC Blood Culture adequate volume   Culture NO GROWTH 5 DAYS  Final   Report Status 04/11/2017 FINAL  Final  Blood Culture (routine x 2)     Status: None   Collection Time: 04/06/17  7:40 AM  Result Value Ref Range Status   Specimen Description BLOOD RIGHT HAND  Final   Special Requests   Final    BOTTLES DRAWN AEROBIC AND ANAEROBIC Blood Culture adequate volume   Culture NO GROWTH 5 DAYS  Final   Report Status  04/11/2017 FINAL  Final  Culture, sputum-assessment     Status: None   Collection Time: 04/06/17  8:40 AM  Result Value Ref Range Status   Specimen Description EXPECTORATED SPUTUM  Final   Special Requests NONE  Final   Sputum evaluation THIS SPECIMEN IS ACCEPTABLE FOR SPUTUM CULTURE  Final   Report Status 04/06/2017 FINAL  Final  Culture, respiratory (NON-Expectorated)     Status: None   Collection Time: 04/06/17  8:40 AM  Result Value Ref Range Status   Specimen Description EXPECTORATED SPUTUM  Final   Special Requests NONE Reflexed from [id number]  Final   Gram Stain   Final    MODERATE WBC PRESENT, PREDOMINANTLY PMN MODERATE GRAM NEGATIVE RODS MODERATE GRAM POSITIVE COCCI Performed at [facility], [place] [address], [place], [place] [address]    Culture   Final    ABUNDANT KLEBSIELLA PNEUMONIAE FEW STREPTOCOCCUS,BETA HEMOLYTIC NOT GROUP A    Report Status 04/08/2017 FINAL  Final   Organism ID, Bacteria KLEBSIELLA PNEUMONIAE  Final      Susceptibility   Klebsiella pneumoniae - MIC*    AMPICILLIN >=32 RESISTANT Resistant     CEFAZOLIN <=4 SENSITIVE Sensitive     CEFEPIME <=1 SENSITIVE Sensitive     CEFTAZIDIME <=1 SENSITIVE Sensitive     CEFTRIAXONE <=1 SENSITIVE Sensitive     CIPROFLOXACIN <=0.25 SENSITIVE Sensitive     GENTAMICIN <=1 SENSITIVE Sensitive     IMIPENEM <=0.25 SENSITIVE Sensitive     TRIMETH/SULFA <=20 SENSITIVE Sensitive     AMPICILLIN/SULBACTAM 4 SENSITIVE Sensitive     PIP/TAZO <=4 SENSITIVE Sensitive     Extended ESBL NEGATIVE Sensitive     * ABUNDANT KLEBSIELLA PNEUMONIAE  MRSA PCR Screening     Status: None   Collection Time: 04/06/17  9:43 AM  Result Value Ref Range Status   MRSA by PCR NEGATIVE NEGATIVE Final    Comment:        The GeneXpert MRSA Assay (FDA approved for NASAL specimens only), is one component of a comprehensive MRSA colonization surveillance program. It is not intended to diagnose MRSA infection nor to guide or monitor  treatment for MRSA infections.     RADIOLOGY:  No results found.    Management plans discussed with the patient, family and they are in agreement.  CODE STATUS:     Code Status Orders        Start     Ordered   04/06/17 0917  Full code  Continuous     04/06/17 0917    TOTAL TIME TAKING CARE OF THIS PATIENT: 40 minutes.    Henreitta Leber M.D on 04/11/2017 at 2:26 PM  Between 7am to 6pm - Pager - [phone]  After 6pm go to [email or web address] - Proofreader  [facility]  Office  [phone]  CC: Primary care physician; Secundino Ginger, PA-C

## 2017-08-21 ENCOUNTER — Telehealth: Payer: Self-pay | Admitting: Gastroenterology

## 2017-08-21 NOTE — Telephone Encounter (Signed)
LVM for pt to return my call.

## 2017-08-21 NOTE — Telephone Encounter (Signed)
Patient called and his throat is hurting and needs to be stretched. Does he need to come into the clinic or can you set up his EGD?

## 2017-08-24 ENCOUNTER — Other Ambulatory Visit: Payer: Self-pay

## 2017-08-24 ENCOUNTER — Encounter: Payer: Self-pay | Admitting: *Deleted

## 2017-08-24 ENCOUNTER — Telehealth: Payer: Self-pay | Admitting: Gastroenterology

## 2017-08-24 DIAGNOSIS — R1310 Dysphagia, unspecified: Secondary | ICD-10-CM

## 2017-08-24 DIAGNOSIS — R1319 Other dysphagia: Secondary | ICD-10-CM

## 2017-08-24 NOTE — Telephone Encounter (Signed)
Patient called and needs to schedule an EGD

## 2017-08-24 NOTE — Telephone Encounter (Signed)
Pt scheduled for an EGD with [facility] at [facility] on 08/27/17.

## 2017-08-26 NOTE — Discharge Instructions (Signed)
General Anesthesia, Adult, Care After °These instructions provide you with information about caring for yourself after your procedure. Your health care provider may also give you more specific instructions. Your treatment has been planned according to current medical practices, but problems sometimes occur. Call your health care provider if you have any problems or questions after your procedure. °What can I expect after the procedure? °After the procedure, it is common to have: °· Vomiting. °· A sore throat. °· Mental slowness. ° °It is common to feel: °· Nauseous. °· Cold or shivery. °· Sleepy. °· Tired. °· Sore or achy, even in parts of your body where you did not have surgery. ° °Follow these instructions at home: °For at least 24 hours after the procedure: °· Do not: °? Participate in activities where you could fall or become injured. °? Drive. °? Use heavy machinery. °? Drink alcohol. °? Take sleeping pills or medicines that cause drowsiness. °? Make important decisions or sign legal documents. °? Take care of children on your own. °· Rest. °Eating and drinking °· If you vomit, drink water, juice, or soup when you can drink without vomiting. °· Drink enough fluid to keep your urine clear or pale yellow. °· Make sure you have little or no nausea before eating solid foods. °· Follow the diet recommended by your health care provider. °General instructions °· Have a responsible adult stay with you until you are awake and alert. °· Return to your normal activities as told by your health care provider. Ask your health care provider what activities are safe for you. °· Take over-the-counter and prescription medicines only as told by your health care provider. °· If you smoke, do not smoke without supervision. °· Keep all follow-up visits as told by your health care provider. This is important. °Contact a health care provider if: °· You continue to have nausea or vomiting at home, and medicines are not helpful. °· You  cannot drink fluids or start eating again. °· You cannot urinate after 8-12 hours. °· You develop a skin rash. °· You have fever. °· You have increasing redness at the site of your procedure. °Get help right away if: °· You have difficulty breathing. °· You have chest pain. °· You have unexpected bleeding. °· You feel that you are having a life-threatening or urgent problem. °This information is not intended to replace advice given to you by your health care provider. Make sure you discuss any questions you have with your health care provider. °Document Released: 11/10/2000 Document Revised: 01/07/2016 Document Reviewed: 07/19/2015 °Elsevier Interactive Patient Education © 2018 Elsevier Inc. ° °

## 2017-08-27 ENCOUNTER — Ambulatory Visit: Payer: Medicaid Other | Admitting: Anesthesiology

## 2017-08-27 ENCOUNTER — Encounter
Admission: RE | Disposition: A | Discharge status: Home / Self Care | Payer: Self-pay | Source: Ambulatory Visit | Attending: Gastroenterology

## 2017-08-27 ENCOUNTER — Ambulatory Visit
Admission: RE | Admit: 2017-08-27 | Discharge: 2017-08-27 | Disposition: A | Discharge status: Home / Self Care | Payer: Medicaid Other | Source: Ambulatory Visit | Attending: Gastroenterology | Admitting: Gastroenterology

## 2017-08-27 DIAGNOSIS — K222 Esophageal obstruction: Secondary | ICD-10-CM | POA: Insufficient documentation

## 2017-08-27 DIAGNOSIS — R1310 Dysphagia, unspecified: Secondary | ICD-10-CM

## 2017-08-27 DIAGNOSIS — Z79899 Other long term (current) drug therapy: Secondary | ICD-10-CM | POA: Insufficient documentation

## 2017-08-27 DIAGNOSIS — Z923 Personal history of irradiation: Secondary | ICD-10-CM | POA: Insufficient documentation

## 2017-08-27 DIAGNOSIS — B3781 Candidal esophagitis: Secondary | ICD-10-CM | POA: Insufficient documentation

## 2017-08-27 DIAGNOSIS — G629 Polyneuropathy, unspecified: Secondary | ICD-10-CM | POA: Diagnosis not present

## 2017-08-27 DIAGNOSIS — Z85818 Personal history of malignant neoplasm of other sites of lip, oral cavity, and pharynx: Secondary | ICD-10-CM | POA: Diagnosis not present

## 2017-08-27 DIAGNOSIS — J449 Chronic obstructive pulmonary disease, unspecified: Secondary | ICD-10-CM | POA: Insufficient documentation

## 2017-08-27 DIAGNOSIS — M19042 Primary osteoarthritis, left hand: Secondary | ICD-10-CM | POA: Insufficient documentation

## 2017-08-27 DIAGNOSIS — Z9221 Personal history of antineoplastic chemotherapy: Secondary | ICD-10-CM | POA: Insufficient documentation

## 2017-08-27 DIAGNOSIS — Z885 Allergy status to narcotic agent status: Secondary | ICD-10-CM | POA: Diagnosis not present

## 2017-08-27 DIAGNOSIS — F17210 Nicotine dependence, cigarettes, uncomplicated: Secondary | ICD-10-CM | POA: Insufficient documentation

## 2017-08-27 HISTORY — PX: ESOPHAGOGASTRODUODENOSCOPY (EGD) WITH PROPOFOL: SHX5813

## 2017-08-27 HISTORY — PX: ESOPHAGEAL DILATION: SHX303

## 2017-08-27 SURGERY — ESOPHAGOGASTRODUODENOSCOPY (EGD) WITH PROPOFOL
Anesthesia: General | Site: Esophagus | Wound class: Clean Contaminated

## 2017-08-27 MED ORDER — PROMETHAZINE HCL 25 MG/ML IJ SOLN: 6.2500 mg | INTRAMUSCULAR | Status: DC | PRN

## 2017-08-27 MED ORDER — FENTANYL CITRATE (PF) 100 MCG/2ML IJ SOLN: 25.0000 ug | INTRAMUSCULAR | Status: DC | PRN

## 2017-08-27 MED ORDER — FLUCONAZOLE 100 MG PO TABS: 100.0000 mg | ORAL_TABLET | Freq: Every day | ORAL | 0 refills | Status: AC

## 2017-08-27 MED ORDER — LACTATED RINGERS IV SOLN
10.0000 mL/h | INTRAVENOUS | Status: DC
  Administered 2017-08-27: 10 mL/h via INTRAVENOUS
  Administered 2017-08-27: 08:00:00 via INTRAVENOUS

## 2017-08-27 MED ORDER — GLYCOPYRROLATE 0.2 MG/ML IJ SOLN
INTRAMUSCULAR | Status: DC | PRN
  Administered 2017-08-27: 0.1 mg via INTRAVENOUS

## 2017-08-27 MED ORDER — OXYCODONE HCL 5 MG/5ML PO SOLN: 5.0000 mg | Freq: Once | ORAL | Status: DC | PRN

## 2017-08-27 MED ORDER — LIDOCAINE HCL (CARDIAC) 20 MG/ML IV SOLN
INTRAVENOUS | Status: DC | PRN
  Administered 2017-08-27: 50 mg via INTRAVENOUS

## 2017-08-27 MED ORDER — MEPERIDINE HCL 25 MG/ML IJ SOLN: 6.2500 mg | INTRAMUSCULAR | Status: DC | PRN

## 2017-08-27 MED ORDER — PROPOFOL 10 MG/ML IV BOLUS
INTRAVENOUS | Status: DC | PRN
  Administered 2017-08-27: 50 mg via INTRAVENOUS

## 2017-08-27 MED ORDER — EPHEDRINE SULFATE 50 MG/ML IJ SOLN
INTRAMUSCULAR | Status: DC | PRN
  Administered 2017-08-27: 5 mg via INTRAVENOUS
  Administered 2017-08-27 (×2): 10 mg via INTRAVENOUS

## 2017-08-27 MED ORDER — OXYCODONE HCL 5 MG PO TABS: 5.0000 mg | ORAL_TABLET | Freq: Once | ORAL | Status: DC | PRN

## 2017-08-27 SURGICAL SUPPLY — 35 items
BALLN DILATOR 10-12 8 (BALLOONS)
BALLN DILATOR 12-15 8 (BALLOONS) ×3
BALLN DILATOR 15-18 8 (BALLOONS)
BALLN DILATOR CRE 0-12 8 (BALLOONS)
BALLN DILATOR ESOPH 8 10 CRE (MISCELLANEOUS) IMPLANT
BALLOON DILATOR 12-15 8 (BALLOONS) IMPLANT
BALLOON DILATOR 15-18 8 (BALLOONS) IMPLANT
BALLOON DILATOR CRE 0-12 8 (BALLOONS) IMPLANT
BLOCK BITE 60FR ADLT L/F GRN (MISCELLANEOUS) ×3 IMPLANT
BRUSH CYTO GASTROSCOPE 3.0 (MISCELLANEOUS) ×1 IMPLANT
BRUSH CYTO GASTROSCOPE 3.0MM (MISCELLANEOUS) ×1
CANISTER SUCT 1200ML W/VALVE (MISCELLANEOUS) ×3 IMPLANT
CLIP HMST 235XBRD CATH ROT (MISCELLANEOUS) IMPLANT
CLIP RESOLUTION 360 11X235 (MISCELLANEOUS)
ELECT REM PT RETURN 9FT ADLT (ELECTROSURGICAL)
ELECTRODE REM PT RTRN 9FT ADLT (ELECTROSURGICAL) IMPLANT
FCP ESCP3.2XJMB 240X2.8X (MISCELLANEOUS)
FORCEPS BIOP RAD 4 LRG CAP 4 (CUTTING FORCEPS) IMPLANT
FORCEPS BIOP RJ4 240 W/NDL (MISCELLANEOUS)
FORCEPS ESCP3.2XJMB 240X2.8X (MISCELLANEOUS) IMPLANT
GOWN CVR UNV OPN BCK APRN NK (MISCELLANEOUS) ×2 IMPLANT
GOWN ISOL THUMB LOOP REG UNIV (MISCELLANEOUS) ×6
INJECTOR VARIJECT VIN23 (MISCELLANEOUS) IMPLANT
KIT DEFENDO VALVE AND CONN (KITS) IMPLANT
KIT ENDO PROCEDURE OLY (KITS) ×3 IMPLANT
MARKER SPOT ENDO TATTOO 5ML (MISCELLANEOUS) IMPLANT
RETRIEVER NET PLAT FOOD (MISCELLANEOUS) IMPLANT
SNARE SHORT THROW 13M SML OVAL (MISCELLANEOUS) IMPLANT
SNARE SHORT THROW 30M LRG OVAL (MISCELLANEOUS) IMPLANT
SPOT EX ENDOSCOPIC TATTOO (MISCELLANEOUS)
SYR INFLATION 60ML (SYRINGE) ×2 IMPLANT
TRAP ETRAP POLY (MISCELLANEOUS) IMPLANT
VARIJECT INJECTOR VIN23 (MISCELLANEOUS)
WATER STERILE IRR 250ML POUR (IV SOLUTION) ×3 IMPLANT
WIRE CRE 18-20MM 8CM F G (MISCELLANEOUS) IMPLANT

## 2017-08-27 NOTE — Addendum Note (Signed)
Addendum  created 08/27/17 0928 by Cameron Ali, CRNA   Child order released for a procedure order, Intraprocedure Blocks edited, Order Canceled from Note, Sign clinical note

## 2017-08-27 NOTE — Op Note (Addendum)
[facility] Patient Name: Mark [name] Procedure Date: 08/27/2017 8:00 AM MRN: [id number] Account #: [id number] Date of Birth: [birth year] Admit Type: Outpatient Age: 62 Room: [facility] OR ROOM 01 Gender: Male Note Status: Supervisor Override Procedure:            Upper GI endoscopy Indications:          Dysphagia Providers:            [name] MD, MD Referring MD:         Otelia Limes. Duran PA, PA (Referring MD) Medicines:            Propofol per Anesthesia Complications:        No immediate complications. Procedure:            Pre-Anesthesia Assessment:                       - Prior to the procedure, a History and Physical was                        performed, and patient medications and allergies were                        reviewed. The patient's tolerance of previous                        anesthesia was also reviewed. The risks and benefits of                        the procedure and the sedation options and risks were                        discussed with the patient. All questions were                        answered, and informed consent was obtained. Prior                        Anticoagulants: The patient has taken no previous                        anticoagulant or antiplatelet agents. ASA Grade                        Assessment: II - A patient with mild systemic disease.                        After reviewing the risks and benefits, the patient was                        deemed in satisfactory condition to undergo the                        procedure.                       After obtaining informed consent, the endoscope was                        passed under direct vision. Throughout the procedure,  the patient's blood pressure, pulse, and oxygen                        saturations were monitored continuously. The Olympus                        GIF H180J Endoscope (S#: [id number]) was introduced   through the mouth, and advanced to the third part of                        duodenum. The upper GI endoscopy was accomplished                        without difficulty. The patient tolerated the procedure                        well. Findings:      One moderate benign-appearing, intrinsic stenosis was found in the upper       third of the esophagus. And was traversed. A TTS dilator was passed       through the scope. Dilation with a 12-13.5-15 mm balloon dilator was       performed to 15 mm. The dilation site was examined following endoscope       reinsertion and showed moderate improvement in luminal narrowing.      Diffuse candidiasis was found in the entire esophagus. Cells for       cytology were obtained by brushing.      The stomach was normal.      The examined duodenum was normal. Impression:           - Benign-appearing esophageal stenosis. Dilated.                       - Monilial esophagitis. Cells for cytology obtained.                       - Normal stomach.                       - Normal examined duodenum. Recommendation:       - Discharge patient to home.                       - Resume previous diet.                       - Continue present medications.                       - Await pathology results. Procedure Code(s):    --- Professional ---                       [phone], Esophagogastroduodenoscopy, flexible, transoral;                        with transendoscopic balloon dilation of esophagus                        (less than 30 mm diameter) Diagnosis Code(s):    --- Professional ---                       R13.10, Dysphagia, unspecified  B37.81, Candidal esophagitis                       K22.2, Esophageal obstruction CPT copyright 2018 American Medical Association. All rights reserved. The codes documented in this report are preliminary and upon coder review may  be revised to meet current compliance requirements. [name] MD, MD 08/27/2017  8:16:16 AM This report has been signed electronically. Number of Addenda: 0 Note Initiated On: 08/27/2017 8:00 AM      [facility]

## 2017-08-27 NOTE — Anesthesia Preprocedure Evaluation (Signed)
Anesthesia Evaluation  Patient identified by MRN, date of birth, ID band Patient awake    Reviewed: Allergy & Precautions, H&P , NPO status , Patient's Chart, lab work & pertinent test results, reviewed documented beta blocker date and time   Airway Mallampati: I  TM Distance: >3 FB Neck ROM: full    Dental  (+) Edentulous Lower, Edentulous Upper   Pulmonary COPD, Current Smoker,  Hx of tosillar cancer, s/p XRT, chemo   Pulmonary exam normal breath sounds clear to auscultation       Cardiovascular Exercise Tolerance: Good negative cardio ROS Normal cardiovascular exam Rhythm:regular Rate:Normal     Neuro/Psych negative neurological ROS  negative psych ROS   GI/Hepatic negative GI ROS, Neg liver ROS,   Endo/Other  negative endocrine ROS  Renal/GU negative Renal ROS  negative genitourinary   Musculoskeletal  (+) Arthritis ,   Abdominal   Peds  Hematology negative hematology ROS (+)   Anesthesia Other Findings   Reproductive/Obstetrics negative OB ROS                             Anesthesia Physical  Anesthesia Plan  ASA: III  Anesthesia Plan: General   Post-op Pain Management:    Induction:   PONV Risk Score and Plan:   Airway Management Planned:   Additional Equipment:   Intra-op Plan:   Post-operative Plan:   Informed Consent: I have reviewed the patients History and Physical, chart, labs and discussed the procedure including the risks, benefits and alternatives for the proposed anesthesia with the patient or authorized representative who has indicated his/her understanding and acceptance.   Dental Advisory Given  Plan Discussed with: CRNA and Anesthesiologist  Anesthesia Plan Comments:         Anesthesia Quick Evaluation

## 2017-08-27 NOTE — Anesthesia Procedure Notes (Addendum)
Date/Time: 08/27/2017 7:57 AM Performed by: Cameron Ali, CRNA Pre-anesthesia Checklist: Patient identified, Emergency Drugs available, Suction available, Timeout performed and Patient being monitored Patient Re-evaluated:Patient Re-evaluated prior to induction Oxygen Delivery Method: Nasal cannula Placement Confirmation: positive ETCO2

## 2017-08-27 NOTE — H&P (Signed)
[name], MD [address] [place], [place] [address] [phone] Fax : [phone]  Primary Care Physician:  Secundino Ginger, PA-C Primary Gastroenterologist:  Dr. Allen Norris  Pre-Procedure History & Physical: HPI:  Mark [name] is a 62 y.o. male is here for an endoscopy.   Past Medical History:  Diagnosis Date  . Arthritis    left hand  . Aspiration pneumonia ([place])   . Cancer ([place])    tonsilal ca  . COPD (chronic obstructive pulmonary disease) ([place])   . Incontinence    night time, began recently  . Memory deficit 11/18/2014  . PEG tube 11/18/2014   removed summer 2017  . Peripheral neuropathy 11/18/2014  . Smokers' cough ([place])   . Tonsillar cancer, S/P surgery, chemotherapy XRT 2009-followed at [facility] 11/18/2014  . Wears dentures    full upper and lower    Past Surgical History:  Procedure Laterality Date  . APPENDECTOMY    . CHOLECYSTECTOMY  2007  . ESOPHAGEAL DILATION N/A 08/01/2016   Procedure: ESOPHAGEAL DILATION;  Surgeon: [name], MD;  Location: [place];  Service: Endoscopy;  Laterality: N/A;  . ESOPHAGEAL DILATION N/A 02/19/2017   Procedure: ESOPHAGEAL DILATION;  Surgeon: [name], MD;  Location: [place];  Service: Endoscopy;  Laterality: N/A;  . ESOPHAGOGASTRODUODENOSCOPY (EGD) WITH PROPOFOL N/A 01/24/2016   Procedure: ESOPHAGOGASTRODUODENOSCOPY (EGD) WITH gastric biopsy and esophageal dilation.;  Surgeon: [name], MD;  Location: [place];  Service: Endoscopy;  Laterality: N/A;  . ESOPHAGOGASTRODUODENOSCOPY (EGD) WITH PROPOFOL N/A 08/01/2016   Procedure: ESOPHAGOGASTRODUODENOSCOPY (EGD) WITH PROPOFOL;  Surgeon: [name], MD;  Location: [place];  Service: Endoscopy;  Laterality: N/A;  . ESOPHAGOGASTRODUODENOSCOPY (EGD) WITH PROPOFOL N/A 02/19/2017   Procedure: ESOPHAGOGASTRODUODENOSCOPY (EGD) WITH PROPOFOL;  Surgeon: [name], MD;  Location: [place];  Service: Endoscopy;   Laterality: N/A;  . PEG PLACEMENT N/A 11/28/2015   Procedure: PERCUTANEOUS ENDOSCOPIC GASTROSTOMY (PEG) PLACEMENT;  Surgeon: [name], MD;  Location: [place];  Service: Endoscopy;  Laterality: N/A;  . PEG TUBE PLACEMENT  10/22/12   10/22/12 - [facility], 10/23/14 - [facility], Dr. Allen Norris, replaced  . 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 Authorizing Provider  albuterol (PROVENTIL HFA;VENTOLIN HFA) 108 (90 BASE) MCG/ACT inhaler Inhale 2 puffs into the lungs every 6 (six) hours as needed for wheezing or shortness of breath.    Yes [provider]  ARIPiprazole (ABILIFY) 2 MG tablet Take 2 mg by mouth daily.    Yes [provider]  clonazePAM (KLONOPIN) 1 MG tablet Take 1 mg by mouth 2 (two) times daily.    Yes [provider]  DULoxetine (CYMBALTA) 60 MG capsule Take 60 mg by mouth daily.   Yes [provider]  Ipratropium-Albuterol (COMBIVENT) 20-100 MCG/ACT AERS respimat Inhale 2 puffs into the lungs 2 (two) times daily.   Yes [provider]  mometasone (NASONEX) 50 MCG/ACT nasal spray Place 2 sprays into both nostrils 2 (two) times daily.   Yes [provider]  Oxycodone HCl 20 MG TABS Take 20 mg by mouth 4 (four) times daily.    Yes [provider]  pantoprazole (PROTONIX) 40 MG tablet Take 40 mg by mouth at bedtime.    Yes [provider]  traZODone (DESYREL) 150 MG tablet Take 150 mg by mouth at bedtime.    Yes [provider]  venlafaxine (EFFEXOR) 37.5 MG tablet Take 37.5 mg by mouth 2 (two) times daily with a meal.    Yes [provider]  guaiFENesin-dextromethorphan (ROBITUSSIN DM) 100-10 MG/5ML syrup Take 5 mLs by mouth every 4 (four) hours as needed for cough. Patient not taking: Reported on 08/24/2017 04/11/17   Henreitta Leber, MD    Allergies as of 08/24/2017 - Review Complete 08/24/2017  Allergen Reaction Noted  .  Morphine and related Nausea And Vomiting 11/18/2014    Family History  Problem Relation Age of Onset  . Liver cancer Mother   . Stroke Father   . Testicular cancer Father     Social History   Socioeconomic History  . Marital status: Married    Spouse name: Not on file  . Number of children: Not on file  . Years of education: Not on file  . Highest education level: Not on file  Social Needs  . Financial resource strain: Not on file  . Food insecurity - worry: Not on file  . Food insecurity - inability: Not on file  . Transportation needs - medical: Not on file  . Transportation needs - non-medical: Not on file  Occupational History  . Not on file  Tobacco Use  . Smoking status: Current Every Day Smoker    Years: 40.00  . Smokeless tobacco: Never Used  . Tobacco comment: was 2 PPD, now 2 cigs/day  Substance and Sexual Activity  . Alcohol use: No  . Drug use: No  . Sexual activity: Not on file  Other Topics Concern  . 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 Neck:  Supple; no masses or thyromegaly. Lungs:  Clear throughout to auscultation.    Heart:  Regular rate and rhythm. Abdomen:  Soft, nontender and nondistended. Normal bowel sounds, without guarding, and without rebound.   Neurologic:  Alert and  oriented x4;  grossly normal neurologically.  Impression/Plan: DAELEN BELVEDERE is here for an endoscopy to be performed for dysphagia  Risks, benefits, limitations, and alternatives regarding  endoscopy have been reviewed with the patient.  Questions have been answered.  All parties agreeable.   [name], MD  08/27/2017, 7:37 AM

## 2017-08-27 NOTE — Anesthesia Postprocedure Evaluation (Signed)
Anesthesia Post Note  Patient: Mark [name]  Procedure(s) Performed: ESOPHAGOGASTRODUODENOSCOPY (EGD) WITH PROPOFOL (N/A Esophagus) ESOPHAGEAL DILATION (N/A Esophagus)  Patient location during evaluation: PACU Anesthesia Type: General Level of consciousness: awake and alert Pain management: pain level controlled Vital Signs Assessment: post-procedure vital signs reviewed and stable Respiratory status: spontaneous breathing, nonlabored ventilation, respiratory function stable and patient connected to nasal cannula oxygen Cardiovascular status: blood pressure returned to baseline and stable Postop Assessment: no apparent nausea or vomiting Anesthetic complications: no    SCOURAS, NICOLE ELAINE

## 2017-08-27 NOTE — Transfer of Care (Signed)
Immediate Anesthesia Transfer of Care Note  Patient: Mark [name]  Procedure(s) Performed: ESOPHAGOGASTRODUODENOSCOPY (EGD) WITH PROPOFOL (N/A Esophagus) ESOPHAGEAL DILATION (N/A Esophagus)  Patient Location: PACU  Anesthesia Type: General  Level of Consciousness: awake, alert  and patient cooperative  Airway and Oxygen Therapy: Patient Spontanous Breathing and Patient connected to supplemental oxygen  Post-op Assessment: Post-op Vital signs reviewed, Patient's Cardiovascular Status Stable, Respiratory Function Stable, Patent Airway and No signs of Nausea or vomiting  Post-op Vital Signs: Reviewed and stable  Complications: No apparent anesthesia complications

## 2017-08-31 ENCOUNTER — Encounter: Payer: Self-pay | Admitting: Gastroenterology

## 2017-09-08 ENCOUNTER — Other Ambulatory Visit: Payer: Self-pay

## 2017-09-08 ENCOUNTER — Emergency Department: Payer: Medicaid Other

## 2017-09-08 ENCOUNTER — Encounter: Payer: Self-pay | Admitting: Emergency Medicine

## 2017-09-08 ENCOUNTER — Inpatient Hospital Stay
Admission: EM | Admit: 2017-09-08 | Discharge: 2017-09-10 | DRG: 871 | Disposition: A | Discharge status: Home / Self Care | Payer: Medicaid Other | Attending: Internal Medicine | Admitting: Internal Medicine

## 2017-09-08 DIAGNOSIS — A419 Sepsis, unspecified organism: Secondary | ICD-10-CM | POA: Diagnosis present

## 2017-09-08 DIAGNOSIS — Z515 Encounter for palliative care: Secondary | ICD-10-CM

## 2017-09-08 DIAGNOSIS — K222 Esophageal obstruction: Secondary | ICD-10-CM | POA: Diagnosis present

## 2017-09-08 DIAGNOSIS — J9621 Acute and chronic respiratory failure with hypoxia: Secondary | ICD-10-CM | POA: Diagnosis present

## 2017-09-08 DIAGNOSIS — Z9221 Personal history of antineoplastic chemotherapy: Secondary | ICD-10-CM

## 2017-09-08 DIAGNOSIS — T17908A Unspecified foreign body in respiratory tract, part unspecified causing other injury, initial encounter: Secondary | ICD-10-CM | POA: Diagnosis present

## 2017-09-08 DIAGNOSIS — Z7189 Other specified counseling: Secondary | ICD-10-CM | POA: Diagnosis not present

## 2017-09-08 DIAGNOSIS — Z9981 Dependence on supplemental oxygen: Secondary | ICD-10-CM | POA: Diagnosis not present

## 2017-09-08 DIAGNOSIS — R4702 Dysphasia: Secondary | ICD-10-CM | POA: Diagnosis present

## 2017-09-08 DIAGNOSIS — Z681 Body mass index (BMI) 19 or less, adult: Secondary | ICD-10-CM

## 2017-09-08 DIAGNOSIS — G8929 Other chronic pain: Secondary | ICD-10-CM | POA: Diagnosis present

## 2017-09-08 DIAGNOSIS — Z8701 Personal history of pneumonia (recurrent): Secondary | ICD-10-CM

## 2017-09-08 DIAGNOSIS — D638 Anemia in other chronic diseases classified elsewhere: Secondary | ICD-10-CM | POA: Diagnosis present

## 2017-09-08 DIAGNOSIS — J449 Chronic obstructive pulmonary disease, unspecified: Secondary | ICD-10-CM | POA: Diagnosis present

## 2017-09-08 DIAGNOSIS — Z79891 Long term (current) use of opiate analgesic: Secondary | ICD-10-CM | POA: Diagnosis not present

## 2017-09-08 DIAGNOSIS — R0602 Shortness of breath: Secondary | ICD-10-CM | POA: Diagnosis not present

## 2017-09-08 DIAGNOSIS — Z923 Personal history of irradiation: Secondary | ICD-10-CM | POA: Diagnosis not present

## 2017-09-08 DIAGNOSIS — K219 Gastro-esophageal reflux disease without esophagitis: Secondary | ICD-10-CM | POA: Diagnosis present

## 2017-09-08 DIAGNOSIS — Z79899 Other long term (current) drug therapy: Secondary | ICD-10-CM

## 2017-09-08 DIAGNOSIS — F17210 Nicotine dependence, cigarettes, uncomplicated: Secondary | ICD-10-CM | POA: Diagnosis present

## 2017-09-08 DIAGNOSIS — I959 Hypotension, unspecified: Secondary | ICD-10-CM | POA: Diagnosis present

## 2017-09-08 DIAGNOSIS — Z885 Allergy status to narcotic agent status: Secondary | ICD-10-CM | POA: Diagnosis not present

## 2017-09-08 DIAGNOSIS — J9601 Acute respiratory failure with hypoxia: Secondary | ICD-10-CM | POA: Diagnosis not present

## 2017-09-08 DIAGNOSIS — G629 Polyneuropathy, unspecified: Secondary | ICD-10-CM | POA: Diagnosis present

## 2017-09-08 DIAGNOSIS — Z85818 Personal history of malignant neoplasm of other sites of lip, oral cavity, and pharynx: Secondary | ICD-10-CM | POA: Diagnosis not present

## 2017-09-08 DIAGNOSIS — B3781 Candidal esophagitis: Secondary | ICD-10-CM | POA: Diagnosis present

## 2017-09-08 DIAGNOSIS — F418 Other specified anxiety disorders: Secondary | ICD-10-CM | POA: Diagnosis present

## 2017-09-08 DIAGNOSIS — J690 Pneumonitis due to inhalation of food and vomit: Secondary | ICD-10-CM | POA: Diagnosis present

## 2017-09-08 DIAGNOSIS — E43 Unspecified severe protein-calorie malnutrition: Secondary | ICD-10-CM

## 2017-09-08 LAB — CBC WITH DIFFERENTIAL/PLATELET
Basophils Absolute: 0 10*3/uL (ref 0–0.1)
Basophils Relative: 0 %
Eosinophils Absolute: 0 10*3/uL (ref 0–0.7)
Eosinophils Relative: 0 %
HEMATOCRIT: 36.8 % — AB (ref 40.0–52.0)
HEMOGLOBIN: 12.2 g/dL — AB (ref 13.0–18.0)
LYMPHS ABS: 0.6 10*3/uL — AB (ref 1.0–3.6)
LYMPHS PCT: 3 %
MCH: 30 pg (ref 26.0–34.0)
MCHC: 33.1 g/dL (ref 32.0–36.0)
MCV: 90.5 fL (ref 80.0–100.0)
MONOS PCT: 5 %
Monocytes Absolute: 0.8 10*3/uL (ref 0.2–1.0)
NEUTROS ABS: 15.7 10*3/uL — AB (ref 1.4–6.5)
NEUTROS PCT: 92 %
Platelets: 205 10*3/uL (ref 150–440)
RBC: 4.07 MIL/uL — ABNORMAL LOW (ref 4.40–5.90)
RDW: 14.8 % — ABNORMAL HIGH (ref 11.5–14.5)
WBC: 17.1 10*3/uL — ABNORMAL HIGH (ref 3.8–10.6)

## 2017-09-08 LAB — COMPREHENSIVE METABOLIC PANEL
ALT: 15 U/L — AB (ref 17–63)
ANION GAP: 10 (ref 5–15)
AST: 30 U/L (ref 15–41)
Albumin: 3.7 g/dL (ref 3.5–5.0)
Alkaline Phosphatase: 80 U/L (ref 38–126)
BUN: 15 mg/dL (ref 6–20)
CHLORIDE: 96 mmol/L — AB (ref 101–111)
CO2: 28 mmol/L (ref 22–32)
Calcium: 8.6 mg/dL — ABNORMAL LOW (ref 8.9–10.3)
Creatinine, Ser: 0.8 mg/dL (ref 0.61–1.24)
GFR calc non Af Amer: >60 mL/min (ref >60)
Glucose, Bld: 150 mg/dL — ABNORMAL HIGH (ref 65–99)
POTASSIUM: 4.1 mmol/L (ref 3.5–5.1)
SODIUM: 134 mmol/L — AB (ref 135–145)
Total Bilirubin: 0.6 mg/dL (ref 0.3–1.2)
Total Protein: 7.4 g/dL (ref 6.5–8.1)

## 2017-09-08 LAB — LACTIC ACID, PLASMA
LACTIC ACID, VENOUS: 1.2 mmol/L (ref 0.5–1.9)
LACTIC ACID, VENOUS: 0.9 mmol/L (ref 0.5–1.9)

## 2017-09-08 LAB — EXPECTORATED SPUTUM ASSESSMENT W GRAM STAIN, RFLX TO RESP C

## 2017-09-08 LAB — TROPONIN I: Troponin I: <0.03 ng/mL (ref <0.03)

## 2017-09-08 LAB — EXPECTORATED SPUTUM ASSESSMENT W REFEX TO RESP CULTURE

## 2017-09-08 MED ORDER — OXYCODONE HCL 5 MG PO TABS
20.0000 mg | ORAL_TABLET | Freq: Four times a day (QID) | ORAL | Status: DC
  Administered 2017-09-08 – 2017-09-10 (×6): 20 mg via ORAL
  Filled 2017-09-08 (×6): qty 4

## 2017-09-08 MED ORDER — FLUCONAZOLE 100 MG PO TABS
100.0000 mg | ORAL_TABLET | Freq: Every day | ORAL | Status: DC
  Administered 2017-09-08 – 2017-09-10 (×3): 100 mg via ORAL
  Filled 2017-09-08 (×3): qty 1

## 2017-09-08 MED ORDER — MORPHINE SULFATE (PF) 2 MG/ML IV SOLN: 2.0000 mg | INTRAVENOUS | Status: DC | PRN

## 2017-09-08 MED ORDER — ACETAMINOPHEN 325 MG PO TABS
650.0000 mg | ORAL_TABLET | Freq: Four times a day (QID) | ORAL | Status: DC | PRN
  Administered 2017-09-09: 650 mg via ORAL
  Filled 2017-09-08: qty 2

## 2017-09-08 MED ORDER — ARIPIPRAZOLE 2 MG PO TABS
2.0000 mg | ORAL_TABLET | Freq: Every day | ORAL | Status: DC
  Administered 2017-09-08 – 2017-09-10 (×3): 2 mg via ORAL
  Filled 2017-09-08 (×3): qty 1

## 2017-09-08 MED ORDER — VANCOMYCIN HCL IN DEXTROSE 750-5 MG/150ML-% IV SOLN
750.0000 mg | Freq: Two times a day (BID) | INTRAVENOUS | Status: DC
  Administered 2017-09-09: 750 mg via INTRAVENOUS
  Filled 2017-09-08 (×4): qty 150

## 2017-09-08 MED ORDER — DOCUSATE SODIUM 100 MG PO CAPS: 100.0000 mg | ORAL_CAPSULE | Freq: Two times a day (BID) | ORAL | Status: DC | PRN

## 2017-09-08 MED ORDER — SODIUM CHLORIDE 0.9 % IV BOLUS (SEPSIS)
1000.0000 mL | Freq: Once | INTRAVENOUS | Status: AC
  Administered 2017-09-08: 1000 mL via INTRAVENOUS

## 2017-09-08 MED ORDER — SODIUM CHLORIDE 0.9 % IV BOLUS (SEPSIS): 250.0000 mL | Freq: Once | INTRAVENOUS | Status: DC

## 2017-09-08 MED ORDER — PIPERACILLIN-TAZOBACTAM 3.375 G IVPB 30 MIN
3.3750 g | Freq: Once | INTRAVENOUS | Status: AC
  Administered 2017-09-08: 3.375 g via INTRAVENOUS
  Filled 2017-09-08: qty 50

## 2017-09-08 MED ORDER — TRAZODONE HCL 50 MG PO TABS
150.0000 mg | ORAL_TABLET | Freq: Every day | ORAL | Status: DC
  Administered 2017-09-09: 21:00:00 150 mg via ORAL
  Filled 2017-09-08: qty 3

## 2017-09-08 MED ORDER — PIPERACILLIN-TAZOBACTAM 3.375 G IVPB
3.3750 g | Freq: Three times a day (TID) | INTRAVENOUS | Status: DC
  Administered 2017-09-08 – 2017-09-10 (×6): 3.375 g via INTRAVENOUS
  Filled 2017-09-08 (×6): qty 50

## 2017-09-08 MED ORDER — VANCOMYCIN HCL IN DEXTROSE 1-5 GM/200ML-% IV SOLN
1000.0000 mg | Freq: Once | INTRAVENOUS | Status: AC
  Administered 2017-09-08: 1000 mg via INTRAVENOUS
  Filled 2017-09-08: qty 200

## 2017-09-08 MED ORDER — DULOXETINE HCL 30 MG PO CPEP
60.0000 mg | ORAL_CAPSULE | Freq: Every day | ORAL | Status: DC
  Administered 2017-09-08 – 2017-09-10 (×3): 60 mg via ORAL
  Filled 2017-09-08 (×3): qty 2

## 2017-09-08 MED ORDER — SODIUM CHLORIDE 0.9 % IV BOLUS (SEPSIS)
500.0000 mL | Freq: Once | INTRAVENOUS | Status: AC
  Administered 2017-09-08: 500 mL via INTRAVENOUS

## 2017-09-08 MED ORDER — SODIUM CHLORIDE 0.9 % IV SOLN
INTRAVENOUS | Status: AC
  Administered 2017-09-08: 18:00:00 200 mL/h via INTRAVENOUS

## 2017-09-08 MED ORDER — VENLAFAXINE HCL 37.5 MG PO TABS
37.5000 mg | ORAL_TABLET | Freq: Two times a day (BID) | ORAL | Status: DC
  Administered 2017-09-08 – 2017-09-10 (×4): 37.5 mg via ORAL
  Filled 2017-09-08 (×5): qty 1

## 2017-09-08 MED ORDER — IPRATROPIUM-ALBUTEROL 0.5-2.5 (3) MG/3ML IN SOLN
3.0000 mL | RESPIRATORY_TRACT | Status: DC
  Administered 2017-09-08 – 2017-09-10 (×8): 3 mL via RESPIRATORY_TRACT
  Filled 2017-09-08 (×9): qty 3

## 2017-09-08 MED ORDER — HEPARIN SODIUM (PORCINE) 5000 UNIT/ML IJ SOLN
5000.0000 [IU] | Freq: Three times a day (TID) | INTRAMUSCULAR | Status: DC
  Administered 2017-09-08 – 2017-09-10 (×5): 5000 [IU] via SUBCUTANEOUS
  Filled 2017-09-08 (×5): qty 1

## 2017-09-08 MED ORDER — SODIUM CHLORIDE 0.9 % IV SOLN
INTRAVENOUS | Status: DC
  Administered 2017-09-08 – 2017-09-10 (×4): via INTRAVENOUS

## 2017-09-08 MED ORDER — CLONAZEPAM 0.5 MG PO TABS
1.0000 mg | ORAL_TABLET | Freq: Two times a day (BID) | ORAL | Status: DC
  Administered 2017-09-09 – 2017-09-10 (×4): 1 mg via ORAL
  Filled 2017-09-08 (×5): qty 2

## 2017-09-08 MED ORDER — SODIUM CHLORIDE 0.9 % IV BOLUS (SEPSIS)
500.0000 mL | Freq: Once | INTRAVENOUS | Status: AC
  Administered 2017-09-08: 17:00:00 500 mL via INTRAVENOUS

## 2017-09-08 NOTE — ED Notes (Signed)
Pt awaiting Admitting MD.

## 2017-09-08 NOTE — Telephone Encounter (Signed)
LVM for pt to return my call.

## 2017-09-08 NOTE — ED Notes (Signed)
Pt presents with O2 sats in 80's. Wife states he has hx of throat cancer and aspiration pneumonia. She reports that he has been sleepier than usual. (Pt states he was "dazed"). Pt had esophageal dilation via endoscopy recently. Pt on thickened liquids.

## 2017-09-08 NOTE — Progress Notes (Signed)
Pharmacy Antibiotic Note  Mark [name] is a 62 y.o. male admitted on 09/08/2017 with sepsis.  Pharmacy has been consulted for zosyn and vancomycin dosing.  Plan: Vancomycin 750mg  IV every 12 hours.  Goal trough 15-20 mcg/mL. Zosyn 3.375g IV q8h (4 hour infusion).  Weight: 125 lb (56.7 kg)  Temp (24hrs), Avg:97.8 F (36.6 C), Min:97.8 F (36.6 C), Max:97.8 F (36.6 C)  Recent Labs  Lab 09/08/17 1015  WBC 17.1*  CREATININE 0.80  LATICACIDVEN 1.2    Estimated Creatinine Clearance: 77.8 mL/min (by C-G formula based on SCr of 0.8 mg/dL).    Allergies  Allergen Reactions  . Morphine And Related Nausea And Vomiting    Antimicrobials this admission: Anti-infectives (From admission, onward)   Start     Dose/Rate Route Frequency Ordered Stop   09/08/17 1500  vancomycin (VANCOCIN) IVPB 750 mg/150 ml premix     750 mg 150 mL/hr over 60 Minutes Intravenous Every 12 hours 09/08/17 1338     09/08/17 1400  piperacillin-tazobactam (ZOSYN) IVPB 3.375 g     3.375 g 12.5 mL/hr over 240 Minutes Intravenous Every 8 hours 09/08/17 1336     09/08/17 1000  piperacillin-tazobactam (ZOSYN) IVPB 3.375 g     3.375 g 100 mL/hr over 30 Minutes Intravenous  Once 09/08/17 0955 09/08/17 1126   09/08/17 1000  vancomycin (VANCOCIN) IVPB 1000 mg/200 mL premix     1,000 mg 200 mL/hr over 60 Minutes Intravenous  Once 09/08/17 0955 09/08/17 1225       Microbiology results: Recent Results (from the past 240 hour(s))  Culture, sputum-assessment     Status: None   Collection Time: 09/08/17 10:15 AM  Result Value Ref Range Status   Specimen Description SPUTUM  Final   Special Requests NONE  Final   Sputum evaluation   Final    Sputum specimen not acceptable for testing.  Please recollect.   Performed at [facility], [address] [place],  [address]    Report Status 09/08/2017 FINAL  Final     Thank you for allowing pharmacy to be a part of this patient's care.  Donna Christen  Adaley Kiene 09/08/2017 1:38 PM

## 2017-09-08 NOTE — ED Notes (Signed)
Resumed care from [place] pt is resting bolus and vanc  is given family at bedside.

## 2017-09-08 NOTE — H&P (Signed)
[facility] NAME: Mark [name]    MR#:  [id number]  DATE OF BIRTH:  1956-01-23  DATE OF ADMISSION:  09/08/2017  PRIMARY CARE PHYSICIAN: Secundino Ginger, PA-C   REQUESTING/REFERRING PHYSICIAN: Corky Downs  CHIEF COMPLAINT:   Chief Complaint  Patient presents with  . Shortness of Breath    HISTORY OF PRESENT ILLNESS: Mark [name]  is a 62 y.o. 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 He had recurrent episodes of aspiration pneumonia in the past. Wife noted since yesterday he is more weak, has jerking movements and he is imbalanced while walking. He cause of his previous recurrent infections she suspected strongly that he is having infection again and brought him to emergency room. He is noted to be hypotensive, elevated white blood cell count, hypoxic, and so given to hospitalist team for further management of his aspiration pneumonia.   PAST MEDICAL HISTORY:   Past Medical History:  Diagnosis Date  . Arthritis    left hand  . Aspiration pneumonia ([place])   . Cancer ([place])    tonsilal ca  . COPD (chronic obstructive pulmonary disease) ([place])   . Incontinence    night time, began recently  . Memory deficit 11/18/2014  . PEG tube 11/18/2014   removed summer 2017  . Peripheral neuropathy 11/18/2014  . Smokers' cough ([place])   . Tonsillar cancer, S/P surgery, chemotherapy XRT 2009-[facility]ed at [facility] 11/18/2014  . Wears dentures    full upper and lower    PAST SURGICAL HISTORY:  Past Surgical History:  Procedure Laterality Date  . APPENDECTOMY    . CHOLECYSTECTOMY  2007  . ESOPHAGEAL DILATION N/A 08/01/2016   Procedure: ESOPHAGEAL DILATION;  Surgeon: [name], MD;  Location: [place];  Service: Endoscopy;  Laterality: N/A;  . ESOPHAGEAL DILATION N/A 02/19/2017   Procedure: ESOPHAGEAL DILATION;  Surgeon: [name], MD;  Location: [place];  Service: Endoscopy;  Laterality: N/A;  . ESOPHAGEAL DILATION N/A 08/27/2017   Procedure: ESOPHAGEAL DILATION;  Surgeon: [name], MD;  Location: [place];  Service: Endoscopy;  Laterality: N/A;  . ESOPHAGOGASTRODUODENOSCOPY (EGD) WITH PROPOFOL N/A 01/24/2016   Procedure: ESOPHAGOGASTRODUODENOSCOPY (EGD) WITH gastric biopsy and esophageal dilation.;  Surgeon: [name], MD;  Location: [place];  Service: Endoscopy;  Laterality: N/A;  . ESOPHAGOGASTRODUODENOSCOPY (EGD) WITH PROPOFOL N/A 08/01/2016   Procedure: ESOPHAGOGASTRODUODENOSCOPY (EGD) WITH PROPOFOL;  Surgeon: [name], MD;  Location: [place];  Service: Endoscopy;  Laterality: N/A;  . ESOPHAGOGASTRODUODENOSCOPY (EGD) WITH PROPOFOL N/A 02/19/2017   Procedure: ESOPHAGOGASTRODUODENOSCOPY (EGD) WITH PROPOFOL;  Surgeon: [name], MD;  Location: [place];  Service: Endoscopy;  Laterality: N/A;  . ESOPHAGOGASTRODUODENOSCOPY (EGD) WITH PROPOFOL N/A 08/27/2017   Procedure: ESOPHAGOGASTRODUODENOSCOPY (EGD) WITH PROPOFOL;  Surgeon: [name], MD;  Location: [facility];  Service: Endoscopy;  Laterality: N/A;  . PEG PLACEMENT N/A 11/28/2015   Procedure: PERCUTANEOUS ENDOSCOPIC GASTROSTOMY (PEG) PLACEMENT;  Surgeon: [name], MD;  Location: [facility];  Service: Endoscopy;  Laterality: N/A;  . PEG TUBE PLACEMENT  10/22/12   10/22/12 - [facility], 10/23/14 - [facility], Dr. Allen Norris, replaced  . TONSILLECTOMY     jan 2010--stage IV    SOCIAL HISTORY:  Social History   Tobacco Use  . Smoking status: Current Every Day Smoker    Years: 40.00  . Smokeless tobacco: Never Used  . Tobacco comment: was 2 PPD, now 2 cigs/day  Substance Use Topics  . Alcohol use: No    FAMILY HISTORY:  Family History  Problem Relation  Age of Onset  . Liver cancer Mother   . Stroke Father   . Testicular cancer Father     DRUG ALLERGIES:  Allergies  Allergen Reactions  . Morphine And Related Nausea And Vomiting    REVIEW OF SYSTEMS:   CONSTITUTIONAL: No fever, positive for fatigue or weakness.  EYES: No blurred or double vision.  EARS, NOSE, AND THROAT: No tinnitus or ear pain.  RESPIRATORY: Was 84 cough, shortness of breath, no wheezing or hemoptysis.  CARDIOVASCULAR: No chest pain, orthopnea, edema.  GASTROINTESTINAL: No nausea, vomiting, diarrhea or abdominal pain.  GENITOURINARY: No dysuria, hematuria.  ENDOCRINE: No polyuria, nocturia,  HEMATOLOGY: No anemia, easy bruising or bleeding SKIN: No rash or lesion. MUSCULOSKELETAL: No joint pain or arthritis.   NEUROLOGIC: No tingling, numbness, weakness.  PSYCHIATRY: No anxiety or depression.   MEDICATIONS AT HOME:  Prior to Admission medications   Medication Sig Start Date End Date Taking? Authorizing Provider  ARIPiprazole (ABILIFY) 2 MG tablet Take 2 mg by mouth daily.    Yes [provider]  clonazePAM (KLONOPIN) 1 MG tablet Take 1 mg by mouth 2 (two) times daily.    Yes [provider]  DULoxetine (CYMBALTA) 60 MG capsule Take 60 mg by mouth daily.   Yes [provider]  fluconazole (DIFLUCAN) 100 MG tablet Take 1 tablet (100 mg total) by mouth daily for 14 days. 08/27/17 09/10/17 Yes [name], MD  Oxycodone HCl 20 MG TABS Take 20 mg by mouth 4 (four) times daily.    Yes [provider]  pantoprazole (PROTONIX) 40 MG tablet Take 40 mg by mouth at bedtime.    Yes [provider]  traZODone (DESYREL) 150 MG tablet Take 150 mg by mouth at bedtime.    Yes [provider]  venlafaxine (EFFEXOR) 37.5 MG tablet Take 37.5 mg by mouth 2 (two) times daily with a meal.    Yes [provider]  albuterol (PROVENTIL HFA;VENTOLIN HFA) 108 (90 BASE) MCG/ACT inhaler Inhale 2 puffs into the lungs every 6  (six) hours as needed for wheezing or shortness of breath.     [provider]  guaiFENesin-dextromethorphan (ROBITUSSIN DM) 100-10 MG/5ML syrup Take 5 mLs by mouth every 4 (four) hours as needed for cough. Patient not taking: Reported on 08/24/2017 04/11/17   Henreitta Leber, MD  Ipratropium-Albuterol (COMBIVENT) 20-100 MCG/ACT AERS respimat Inhale 2 puffs into the lungs 2 (two) times daily.    [provider]  mometasone (NASONEX) 50 MCG/ACT nasal spray Place 2 sprays into both nostrils 2 (two) times daily.    [provider]      PHYSICAL EXAMINATION:   VITAL SIGNS: Blood pressure (!) 72/44, pulse 79, temperature 97.8 F (36.6 C), temperature source Oral, resp. rate 18, weight 56.7 kg (125 lb), SpO2 92 %.  GENERAL:  62 y.o.-year-old patient lying in the bed with no acute distress.  EYES: Pupils equal, round, reactive to light and accommodation. No scleral icterus. Extraocular muscles intact.  HEENT: Head atraumatic, normocephalic. Oropharynx and nasopharynx clear.  NECK:  Supple, no jugular venous distention. No thyroid enlargement, no tenderness.  LUNGS: Normal breath sounds bilaterally, no wheezing, have  crepitation. No use of accessory muscles of respiration.  CARDIOVASCULAR: S1, S2 normal. No murmurs, rubs, or gallops.  ABDOMEN: Soft, nontender, nondistended. Bowel sounds present. No organomegaly or mass.  EXTREMITIES: No pedal edema, cyanosis, or clubbing.  NEUROLOGIC: Cranial nerves II through XII are intact. Muscle strength 4-5/5 in all extremities. Sensation intact. Gait not checked.  PSYCHIATRIC: The patient is alert and oriented x 3.  SKIN: No obvious rash, lesion, or ulcer.   LABORATORY PANEL:   CBC Recent Labs  Lab 09/08/17 1015  WBC 17.1*  HGB 12.2*  HCT 36.8*  PLT 205  MCV 90.5  MCH 30.0  MCHC 33.1  RDW 14.8*  LYMPHSABS 0.6*  MONOABS 0.8  EOSABS 0.0  BASOSABS 0.0    ------------------------------------------------------------------------------------------------------------------  Chemistries  Recent Labs  Lab 09/08/17 1015  NA 134*  K 4.1  CL 96*  CO2 28  GLUCOSE 150*  BUN 15  CREATININE 0.80  CALCIUM 8.6*  AST 30  ALT 15*  ALKPHOS 80  BILITOT 0.6   ------------------------------------------------------------------------------------------------------------------ estimated creatinine clearance is 77.8 mL/min (by C-G formula based on SCr of 0.8 mg/dL). ------------------------------------------------------------------------------------------------------------------ No results for input(s): TSH, T4TOTAL, T3FREE, THYROIDAB in the last 72 hours.  Invalid input(s): FREET3   Coagulation profile No results for input(s): INR, PROTIME in the last 168 hours. ------------------------------------------------------------------------------------------------------------------- No results for input(s): DDIMER in the last 72 hours. -------------------------------------------------------------------------------------------------------------------  Cardiac Enzymes Recent Labs  Lab 09/08/17 1015  TROPONINI <0.03   ------------------------------------------------------------------------------------------------------------------ Invalid input(s): POCBNP  ---------------------------------------------------------------------------------------------------------------  Urinalysis    Component Value Date/Time   COLORURINE YELLOW (A) 04/23/2015 1252   APPEARANCEUR CLEAR (A) 04/23/2015 1252   APPEARANCEUR Clear 11/18/2014 1402   LABSPEC 1.020 04/23/2015 1252   LABSPEC 1.013 11/18/2014 1402   PHURINE 8.0 04/23/2015 1252   GLUCOSEU NEGATIVE 04/23/2015 1252   GLUCOSEU 150 mg/dL 11/18/2014 1402   HGBUR NEGATIVE 04/23/2015 1252   BILIRUBINUR NEGATIVE 04/23/2015 1252   BILIRUBINUR Negative 11/18/2014 1402   KETONESUR NEGATIVE 04/23/2015 1252    PROTEINUR 100 (A) 04/23/2015 1252   UROBILINOGEN 0.2 01/03/2010 0253   NITRITE NEGATIVE 04/23/2015 1252   LEUKOCYTESUR NEGATIVE 04/23/2015 1252   LEUKOCYTESUR Negative 11/18/2014 1402     RADIOLOGY: Dg Chest Port 1 View  Result Date: 09/08/2017 CLINICAL DATA:  Dyspnea EXAM: PORTABLE CHEST 1 VIEW COMPARISON:  04/08/2017 chest radiograph. FINDINGS: Stable cardiomediastinal silhouette with normal heart size. No pneumothorax. No pleural effusion. There are patchy faint nodular opacities throughout both lungs, including a new approximately 2 cm irregular nodular opacity in the peripheral upper right lung. IMPRESSION: Patchy faint nodular opacities throughout both lungs including a new approximately 2 cm irregular nodular opacity in the peripheral upper right lung. 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 A follow-up chest CT is recommended to evaluate the new nodular opacity in the right upper lung. Electronically Signed   By: Ilona Sorrel M.D.   On: 09/08/2017 10:10    EKG: Orders placed or performed during the hospital encounter of 04/06/17  . ED EKG 12-Lead  . ED EKG 12-Lead    IMPRESSION AND PLAN:  * Sepsis   Acute respiratory failure with hypoxia    IV fluids, Cx sent.  Broad spectrum antibiotics.  Continue supplement oxygen.  * Aspiration pneumonia   Broad-spectrum antibiotics for now.   Speech and swallow evaluation.  * COPD   Nebulizer treatment, no wheezing.  * Anxiety  and depression   Continue home medications.  * Gastroesophageal reflux disease   Continue pantoprazole.  * Chronic pain   Continue oxycodone.   All the records are reviewed and case discussed with ED provider. Management plans discussed with the patient, family and they are in agreement.  CODE STATUS: Full code    Code Status Orders  (From  admission, onward)        Start     Ordered   09/08/17 1520  Full code  Continuous     09/08/17 1520    Code Status History    Date Active Date Inactive Code Status Order ID Comments User Context   04/06/2017 09:17 04/11/2017 17:48 Full Code [id number]  Theodoro Grist, MD ED   04/23/2015 13:01 04/25/2015 17:57 Full Code [id number]  Loletha Grayer, MD ED   11/18/2014 17:52 11/21/2014 15:04 Full Code [id number]  Nita Sells, MD Inpatient     His wife was present in the room during my visit.  TOTAL TIME TAKING CARE OF THIS PATIENT: 50  minutes.    Vaughan Basta M.D on 09/08/2017   Between 7am to 6pm - Pager - [phone]  After 6pm go to [email or web address] - password [email or web address] [facility]  Office  [phone]  CC: Primary care physician; Secundino Ginger, PA-C   Note: This dictation was prepared with Dragon dictation along with smaller phrase technology. Any transcriptional errors that result from this process are unintentional.

## 2017-09-08 NOTE — ED Notes (Signed)
Pt placed on 2L O2 for low saturation.

## 2017-09-08 NOTE — ED Notes (Signed)
ED Provider at bedside. 

## 2017-09-08 NOTE — Progress Notes (Signed)
Family Meeting Note  Advance Directive:yes  Today a meeting took place with the Patient and spouse.  The following clinical team members were present during this meeting:MD  The following were discussed:Patient's diagnosis: s/p cancer, aspiration pneumonia, sepsis., Patient's progosis: Unable to determine and Goals for treatment: Full Code  Additional follow-up to be provided: PCP  Time spent during discussion:20 minutes  Vaughan Basta, MD

## 2017-09-08 NOTE — Progress Notes (Signed)
See orders - multiple calls to Dr. Anselm Jungling regarding patients blood pressure.  Patient asymptomatic.

## 2017-09-08 NOTE — Telephone Encounter (Signed)
-----   Message from [name], MD sent at 09/02/2017  7:56 AM EST ----- The patient was given a prescription for Diflucan. Make sure the patient took the medication and inform him that the brushings came back to show a yeast infection. He should contact us if he is having any further problems.

## 2017-09-08 NOTE — ED Triage Notes (Signed)
Pt to ed with c/o cough, congestion, fever and hx of aspiration pneumonia.

## 2017-09-08 NOTE — ED Provider Notes (Addendum)
[facility] Emergency Department Provider Note   ____________________________________________    I have reviewed the triage vital signs and the nursing notes.   HISTORY  Chief Complaint Shortness of Breath     HPI Mark [name] is a 62 y.o. male who presents with complaints of weakness, mild shortness of breath and intermittent cough.  Patient has a history of aspiration pneumonia secondary to esophageal dysphasia.  This morning he felt weak and fatigued and family noted that he had a cough and was more short of breath than usual.  Patient wears oxygen at night but not during the day.  Family believes that he had a fever, the patient is not sure.  Review of records demonstrates admission for severe sepsis related to aspiration pneumonia in the past   Past Medical History:  Diagnosis Date  . Arthritis    left hand  . Aspiration pneumonia ([place])   . Cancer ([place])    tonsilal ca  . COPD (chronic obstructive pulmonary disease) ([place])   . Incontinence    night time, began recently  . Memory deficit 11/18/2014  . PEG tube 11/18/2014   removed summer 2017  . Peripheral neuropathy 11/18/2014  . Smokers' cough ([place])   . Tonsillar cancer, S/P surgery, chemotherapy XRT 2009-followed at [facility] 11/18/2014  . Wears dentures    full upper and lower    Patient Active Problem List   Diagnosis Date Noted  . Esophageal candidiasis ([place])   . Esophageal dysphagia   . Palliative care encounter   . Severe sepsis with septic shock ([place]) 04/06/2017  . Multifocal pneumonia 04/06/2017  . Lactic acidosis 04/06/2017  . Acute respiratory failure with hypoxia ([place]) 04/06/2017  . Septic shock ([place]) 04/06/2017  . Obstruction of esophagus   . 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 Bronchiectasis ([place]) 03/26/2016  . Stricture and stenosis of esophagus   . Gastrostomy status ([place])   . COPD (chronic obstructive pulmonary disease) ([place]) 12/19/2015  .  Tobacco dependency 12/19/2015  . 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 Problems with swallowing and mastication   . Mechanical complication of gastrostomy ([place])   . Gastritis   . Excessive urination at night 05/30/2015  . Encounter for screening for malignant neoplasm of prostate 05/30/2015  . FOM (frequency of micturition) 05/30/2015  . Sepsis ([place]) 04/23/2015  . Aspiration pneumonia ([place]) 11/18/2014  . Tonsillar cancer, S/P surgery, chemotherapy XRT 2009-followed at [facility] 11/18/2014  . Peripheral neuropathy 11/18/2014  . Adult failure to thrive 11/18/2014  . Recurrent aspiration pneumonia ([place]) 11/18/2014  . Memory deficit 11/18/2014  . PEG tube  11/18/2014    Past Surgical History:  Procedure Laterality Date  . APPENDECTOMY    . CHOLECYSTECTOMY  2007  . ESOPHAGEAL DILATION N/A 08/01/2016   Procedure: ESOPHAGEAL DILATION;  Surgeon: [name], MD;  Location: [place];  Service: Endoscopy;  Laterality: N/A;  . ESOPHAGEAL DILATION N/A 02/19/2017   Procedure: ESOPHAGEAL DILATION;  Surgeon: [name], MD;  Location: [place];  Service: Endoscopy;  Laterality: N/A;  . ESOPHAGEAL DILATION N/A 08/27/2017   Procedure: ESOPHAGEAL DILATION;  Surgeon: [name], MD;  Location: [place];  Service: Endoscopy;  Laterality: N/A;  . ESOPHAGOGASTRODUODENOSCOPY (EGD) WITH PROPOFOL N/A 01/24/2016   Procedure: ESOPHAGOGASTRODUODENOSCOPY (EGD) WITH gastric biopsy and esophageal dilation.;  Surgeon: [name], MD;  Location: [place];  Service: Endoscopy;  Laterality: N/A;  . ESOPHAGOGASTRODUODENOSCOPY (EGD) WITH PROPOFOL N/A 08/01/2016   Procedure:  ESOPHAGOGASTRODUODENOSCOPY (EGD) WITH PROPOFOL;  Surgeon: [name], MD;  Location: [place];  Service: Endoscopy;  Laterality: N/A;  . ESOPHAGOGASTRODUODENOSCOPY (EGD) WITH PROPOFOL N/A 02/19/2017   Procedure: ESOPHAGOGASTRODUODENOSCOPY (EGD) WITH PROPOFOL;  Surgeon: [name], MD;  Location:  [place];  Service: Endoscopy;  Laterality: N/A;  . ESOPHAGOGASTRODUODENOSCOPY (EGD) WITH PROPOFOL N/A 08/27/2017   Procedure: ESOPHAGOGASTRODUODENOSCOPY (EGD) WITH PROPOFOL;  Surgeon: [name], MD;  Location: [place];  Service: Endoscopy;  Laterality: N/A;  . PEG PLACEMENT N/A 11/28/2015   Procedure: PERCUTANEOUS ENDOSCOPIC GASTROSTOMY (PEG) PLACEMENT;  Surgeon: [name], MD;  Location: [place];  Service: Endoscopy;  Laterality: N/A;  . PEG TUBE PLACEMENT  10/22/12   10/22/12 - [facility], 10/23/14 - [facility], Dr. Allen Norris, replaced  . 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 Authorizing Provider  ARIPiprazole (ABILIFY) 2 MG tablet Take 2 mg by mouth daily.    Yes [provider]  clonazePAM (KLONOPIN) 1 MG tablet Take 1 mg by mouth 2 (two) times daily.    Yes [provider]  DULoxetine (CYMBALTA) 60 MG capsule Take 60 mg by mouth daily.   Yes [provider]  fluconazole (DIFLUCAN) 100 MG tablet Take 1 tablet (100 mg total) by mouth daily for 14 days. 08/27/17 09/10/17 Yes [name], MD  Oxycodone HCl 20 MG TABS Take 20 mg by mouth 4 (four) times daily.    Yes [provider]  pantoprazole (PROTONIX) 40 MG tablet Take 40 mg by mouth at bedtime.    Yes [provider]  traZODone (DESYREL) 150 MG tablet Take 150 mg by mouth at bedtime.    Yes [provider]  venlafaxine (EFFEXOR) 37.5 MG tablet Take 37.5 mg by mouth 2 (two) times daily with a meal.    Yes [provider]  albuterol (PROVENTIL HFA;VENTOLIN HFA) 108 (90 BASE) MCG/ACT inhaler Inhale 2 puffs into the lungs every 6 (six) hours as needed for wheezing or shortness of breath.     [provider]  guaiFENesin-dextromethorphan (ROBITUSSIN DM) 100-10 MG/5ML syrup Take 5 mLs by mouth every 4 (four) hours as needed for cough. Patient not taking: Reported on 08/24/2017  04/11/17   Henreitta Leber, MD  Ipratropium-Albuterol (COMBIVENT) 20-100 MCG/ACT AERS respimat Inhale 2 puffs into the lungs 2 (two) times daily.    [provider]  mometasone (NASONEX) 50 MCG/ACT nasal spray Place 2 sprays into both nostrils 2 (two) times daily.    [provider]     Allergies Morphine and related  Family History  Problem Relation Age of Onset  . Liver cancer Mother   . Stroke Father   . Testicular cancer Father     Social History Social History   Tobacco Use  . Smoking status: Current Every Day Smoker    Years: 40.00  . Smokeless tobacco: Never Used  . Tobacco comment: was 2 PPD, now 2 cigs/day  Substance Use Topics  . Alcohol use: No  . Drug use: No    Review of Systems  Constitutional: As above Eyes: No visual changes.  ENT: No sore throat. Cardiovascular: Denies chest pain. Respiratory: Positive cough Gastrointestinal: No abdominal pain.  No nausea, no vomiting.   Genitourinary: Negative for dysuria. Musculoskeletal: Negative for back pain. Skin: Negative for rash. Neurological: Negative for headaches    ____________________________________________   PHYSICAL EXAM:  VITAL SIGNS: ED Triage Vitals  Enc Vitals Group     BP 09/08/17 0945 (!) 71/33     Pulse Rate 09/08/17 0945 (!) 118     Resp 09/08/17 0945 (!) 24     Temp 09/08/17 0945 97.8 F (36.6 C)     Temp Source 09/08/17 0945 Oral     SpO2 09/08/17 0945 (!) 87 %     Weight 09/08/17 0946 56.7 kg (125 lb)     Height --      Head Circumference --      Peak Flow --      Pain Score 09/08/17 0946 0     Pain Loc --      Pain Edu? --      Excl. in [place]? --     Constitutional: Alert and oriented.   Eyes: Conjunctivae are normal.   Nose: No congestion/rhinnorhea. Mouth/Throat: Mucous membranes are moist.   Neck:  Painless ROM Cardiovascular: Normal rate, regular rhythm. Grossly normal heart sounds.  Good peripheral circulation. Respiratory: Normal respiratory  effort.  No retractions.  No wheezing Gastrointestinal: Soft and nontender. No distention.  No CVA tenderness. Genitourinary: deferred Musculoskeletal: No lower extremity tenderness nor edema.  Warm and well perfused Neurologic:  Normal speech and language. No gross focal neurologic deficits are appreciated.  Skin:  Skin is warm, dry and intact. No rash noted. Psychiatric: Mood and affect are normal. Speech and behavior are normal.  ____________________________________________   LABS (all labs ordered are listed, but only abnormal results are displayed)  Labs Reviewed  COMPREHENSIVE METABOLIC PANEL - Abnormal; Notable for the following components:      Result Value   Sodium 134 (*)    Chloride 96 (*)    Glucose, Bld 150 (*)    Calcium 8.6 (*)    ALT 15 (*)    All other components within normal limits  CBC WITH DIFFERENTIAL/PLATELET - Abnormal; Notable for the following components:   WBC 17.1 (*)    RBC 4.07 (*)    Hemoglobin 12.2 (*)    HCT 36.8 (*)    RDW 14.8 (*)    Neutro Abs 15.7 (*)    Lymphs Abs 0.6 (*)    All other components within normal limits  CULTURE, EXPECTORATED SPUTUM-ASSESSMENT  CULTURE, BLOOD (ROUTINE X 2)  CULTURE, BLOOD (ROUTINE X 2)  URINE CULTURE  LACTIC ACID, PLASMA  TROPONIN I  LACTIC ACID, PLASMA  URINALYSIS, COMPLETE (UACMP) WITH MICROSCOPIC   ____________________________________________  EKG  None ____________________________________________  RADIOLOGY  Chest x-ray shows patchy faint nodular opacities throughout both lungs ____________________________________________   PROCEDURES  Procedure(s) performed: No  Procedures   Critical Care performed: yes  CRITICAL CARE Performed by: Lavonia Drafts   Total critical care time: 35 minutes  Critical care time was exclusive of separately billable procedures and treating other patients.  Critical care was necessary to treat or prevent imminent or life-threatening  deterioration.  Critical care was time spent personally by me on the following activities: development of treatment plan with patient and/or surrogate as well as nursing, discussions with consultants, evaluation of patient's response to treatment, examination of patient, obtaining history from patient or surrogate, ordering and performing treatments and interventions, ordering and review of laboratory studies, ordering and review of radiographic studies, pulse oximetry and re-evaluation of patient's condition.  ____________________________________________   INITIAL IMPRESSION / ASSESSMENT AND PLAN / ED COURSE  Pertinent labs & imaging results that were available during my care of the patient were reviewed by me and considered in  my medical decision making (see chart for details).  Patient with a history of aspiration pneumonia presents with shortness of breath, cough subjective fever and also noted to be hypertensive.  Patient's blood pressure does run low but today's blood pressures are concerning.  Differential diagnosis includes sepsis, pneumonia, generalized weakness.  Will check labs chest x-ray give IV fluids and reevaluate.  Chest x-ray is consistent with likely chronic process, elevated white blood cell count noted.  Lactic is normal.  Given the patient's is hypotensive with an elevated white blood cell count I do suspect likely early aspiration pneumonia.  Treated broadly with antibiotics and will admit to the hospitalist    ____________________________________________   FINAL CLINICAL IMPRESSION(S) / ED DIAGNOSES  Final diagnoses:  Aspiration pneumonia, unspecified aspiration pneumonia type, unspecified laterality, unspecified part of lung ([place])  Sepsis, due to unspecified organism [facility])        Note:  This document was prepared using Dragon voice recognition software and may include unintentional dictation errors.    Lavonia Drafts, MD 09/08/17 1203    Lavonia Drafts,  MD 09/17/17 1047

## 2017-09-08 NOTE — Progress Notes (Addendum)
BP 81/49 (57) after 1 liter fluid bolus over 5 hours. Pt requesting pain medication that is scheduled but held because of blood pressure.  Call out to doctor pending response.  25 Spoke with Dr Duane Boston and she agreed pt should not have pain medication at this time.  Offered tylenol which pt took. 0400 Pt complaining of neuropathic pain.  Requesting pain medication.  BP 82/53 (59).  Nurse judgement that BP is still too low for Oxy IR 20 mg po.  Offered klonopin for neuropathy.  Pt agreeable and given. Dorna Bloom RN

## 2017-09-09 ENCOUNTER — Inpatient Hospital Stay: Payer: Medicaid Other

## 2017-09-09 DIAGNOSIS — J9601 Acute respiratory failure with hypoxia: Secondary | ICD-10-CM

## 2017-09-09 DIAGNOSIS — Z7189 Other specified counseling: Secondary | ICD-10-CM

## 2017-09-09 DIAGNOSIS — Z515 Encounter for palliative care: Secondary | ICD-10-CM

## 2017-09-09 DIAGNOSIS — A419 Sepsis, unspecified organism: Principal | ICD-10-CM

## 2017-09-09 DIAGNOSIS — E43 Unspecified severe protein-calorie malnutrition: Secondary | ICD-10-CM

## 2017-09-09 DIAGNOSIS — J690 Pneumonitis due to inhalation of food and vomit: Secondary | ICD-10-CM

## 2017-09-09 LAB — URINALYSIS, COMPLETE (UACMP) WITH MICROSCOPIC
BACTERIA UA: NONE SEEN
Bilirubin Urine: NEGATIVE
Hgb urine dipstick: NEGATIVE
KETONES UR: NEGATIVE mg/dL
LEUKOCYTES UA: NEGATIVE
Nitrite: NEGATIVE
PH: 5 (ref 5.0–8.0)
PROTEIN: NEGATIVE mg/dL
Specific Gravity, Urine: 1.017 (ref 1.005–1.030)
Squamous Epithelial / LPF: NONE SEEN

## 2017-09-09 LAB — CBC
HEMATOCRIT: 30.1 % — AB (ref 40.0–52.0)
Hemoglobin: 9.9 g/dL — ABNORMAL LOW (ref 13.0–18.0)
MCH: 30.2 pg (ref 26.0–34.0)
MCHC: 32.7 g/dL (ref 32.0–36.0)
MCV: 92.3 fL (ref 80.0–100.0)
Platelets: 152 10*3/uL (ref 150–440)
RBC: 3.26 MIL/uL — ABNORMAL LOW (ref 4.40–5.90)
RDW: 15 % — AB (ref 11.5–14.5)
WBC: 8.8 10*3/uL (ref 3.8–10.6)

## 2017-09-09 LAB — PROCALCITONIN: Procalcitonin: 4.45 ng/mL

## 2017-09-09 LAB — BASIC METABOLIC PANEL
Anion gap: 5 (ref 5–15)
BUN: 13 mg/dL (ref 6–20)
CHLORIDE: 106 mmol/L (ref 101–111)
CO2: 29 mmol/L (ref 22–32)
Calcium: 8.2 mg/dL — ABNORMAL LOW (ref 8.9–10.3)
Creatinine, Ser: 0.66 mg/dL (ref 0.61–1.24)
GFR calc Af Amer: >60 mL/min (ref >60)
GFR calc non Af Amer: >60 mL/min (ref >60)
GLUCOSE: 113 mg/dL — AB (ref 65–99)
Potassium: 3.7 mmol/L (ref 3.5–5.1)
Sodium: 140 mmol/L (ref 135–145)

## 2017-09-09 LAB — INFLUENZA PANEL BY PCR (TYPE A & B)
Influenza A By PCR: NEGATIVE
Influenza B By PCR: NEGATIVE

## 2017-09-09 LAB — MRSA PCR SCREENING: MRSA by PCR: NEGATIVE

## 2017-09-09 MED ORDER — METHYLPREDNISOLONE SODIUM SUCC 40 MG IJ SOLR
40.0000 mg | Freq: Every day | INTRAMUSCULAR | Status: DC
  Administered 2017-09-09 – 2017-09-10 (×2): 40 mg via INTRAVENOUS
  Filled 2017-09-09 (×2): qty 1

## 2017-09-09 MED ORDER — MIDODRINE HCL 5 MG PO TABS
2.5000 mg | ORAL_TABLET | Freq: Three times a day (TID) | ORAL | Status: DC
  Administered 2017-09-09 – 2017-09-10 (×4): 2.5 mg via ORAL
  Filled 2017-09-09: qty 1
  Filled 2017-09-09 (×3): qty 0.5
  Filled 2017-09-09: qty 1
  Filled 2017-09-09: qty 0.5

## 2017-09-09 MED ORDER — PANTOPRAZOLE SODIUM 40 MG PO TBEC
40.0000 mg | DELAYED_RELEASE_TABLET | Freq: Every day | ORAL | Status: DC
  Administered 2017-09-09 – 2017-09-10 (×2): 40 mg via ORAL
  Filled 2017-09-09 (×2): qty 1

## 2017-09-09 NOTE — Progress Notes (Signed)
Patient ID: Mark [name], male   DOB: 1955/12/31, 62 y.o.   MRN: [id number]   ACP note.  Diagnosis; aspiration pneumonia.  Speech therapy recommends n.p.o. because the patient is a high risk for aspiration.  The patient wants to eat and does not want a PEG tube placement.  Will place on mechanical soft diet with nectar thick liquids.  CODE STATUS discussed.  Patient wants to be a full code.  Time spent on ACP discussion 17 minutes  Dr. Loletha Grayer

## 2017-09-09 NOTE — Evaluation (Signed)
Physical Therapy Evaluation Patient Details Name: Mark [name] MRN: [id number] DOB: 01/02/1956 Today's Date: 09/09/2017   History of Present Illness  Pt admitted for aspiration pneumonia following c/o SOB.  PMH includes tonsillar CA, hypotension, peripheral neuropathy, SOPD, CA, aspiration pneumonia and arthritis.  Clinical Impression  Pt is a 62 year old male who lives with his wife and children in a mobile home.  Strength of UE and LE were WNL and pt reports N/T in bilateral feet.  Pt did not require assistance with bed mobility or transfers.  Pt does not use an AD for ambulation but could benefit from use of a SPC.  Pt ambulated 200 ft with VC's from PT for maintenance of wider BOS.  Pt demonstrated mild lateral and anterior LOB with self correction during dynamic gait activity.  PT educated pt concerning importance of wearing O2 during the day while in hospital due to pt trying to remove cannula on multiple occasions.  PT measured pt's O2 at 87 and pt expressed understanding after seeing the number on pulse ox.  Pt will continue to benefit from skilled PT with focus on balance and management of appropriate AD.    Follow Up Recommendations Outpatient PT(Pt will also benefit from [facility] program.)    Equipment Recommendations  Cane    Recommendations for Other Services       Precautions / Restrictions Precautions Precautions: Fall Restrictions Weight Bearing Restrictions: No      Mobility  Bed Mobility Overal bed mobility: Independent                Transfers Overall transfer level: Independent                  Ambulation/Gait Ambulation/Gait assistance: Supervision Ambulation Distance (Feet): 200 Feet       Gait velocity interpretation: at or above normal speed for age/gender General Gait Details: Pt presented with a crossover gait during ambulation, good foot clearance, knee flexion and hip flexion.  PT educated pt concerning maintaining a good BOS  for fall prevention and pt corrected immediately following VC's.  Needed frequent VC's after.  Stairs            Wheelchair Mobility    Modified Rankin (Stroke Patients Only)       Balance Overall balance assessment: Independent Sitting-balance support: No upper extremity supported       Standing balance support: No upper extremity supported                 High level balance activites: Side stepping;Backward walking;Direction changes High Level Balance Comments: Pt able to perform higher level dynamic gait with mild lateral or anterior LOB, self corrected.  PT provided VC's for wider BOS during gait.  Pt reports never having used an AD but may benefit from a SPC.             Pertinent Vitals/Pain Pain Assessment: 0-10 Pain Score: 6  Pain Location: feet    Home Living Family/patient expects to be discharged to:: Private residence Living Arrangements: Spouse/significant other Available Help at Discharge: Family Type of Home: Mobile home Home Access: Ramped entrance Entrance Stairs-Rails: Can reach both   Home Layout: One level Home Equipment: [place] - 2 wheels;Cane - quad      Prior Function Level of Independence: Independent               Hand Dominance        Extremity/Trunk Assessment   Upper Extremity Assessment Upper  Extremity Assessment: Overall WFL for tasks assessed    Lower Extremity Assessment Lower Extremity Assessment: Overall WFL for tasks assessed(Pt reports N/T in bilateral feet.)    Cervical / Trunk Assessment Cervical / Trunk Assessment: Normal  Communication   Communication: HOH  Cognition Arousal/Alertness: Awake/alert Behavior During Therapy: WFL for tasks assessed/performed Overall Cognitive Status: Within Functional Limits for tasks assessed                                        General Comments      Exercises     Assessment/Plan    PT Assessment Patient needs continued PT services   PT Problem List Decreased balance;Impaired sensation       PT Treatment Interventions DME instruction    PT Goals (Current goals can be found in the Care Plan section)  Acute Rehab PT Goals Patient Stated Goal: To return home and continue to be as active as possible. PT Goal Formulation: With patient Time For Goal Achievement: 09/23/17 Potential to Achieve Goals: Good    Frequency Min 2X/week   Barriers to discharge        Co-evaluation               AM-PAC PT "6 Clicks" Daily Activity  Outcome Measure Difficulty turning over in bed (including adjusting bedclothes, sheets and blankets)?: None Difficulty moving from lying on back to sitting on the side of the bed? : A Little Difficulty sitting down on and standing up from a chair with arms (e.g., wheelchair, bedside commode, etc,.)?: A Little Help needed moving to and from a bed to chair (including a wheelchair)?: A Little Help needed walking in hospital room?: A Little Help needed climbing 3-5 steps with a railing? : A Little 6 Click Score: 19    End of Session Equipment Utilized During Treatment: Oxygen Activity Tolerance: Patient tolerated treatment well Patient left: in chair;with call bell/phone within reach Nurse Communication: Mobility status PT Visit Diagnosis: Unsteadiness on feet (R26.81)    Time: 1540-0867 PT Time Calculation (min) (ACUTE ONLY): 25 min   Charges:   PT Evaluation $PT Eval Low Complexity: 1 Low PT Treatments $Gait Training: 8-22 mins   PT G Codes:   PT G-Codes **NOT FOR INPATIENT CLASS** Functional Assessment Tool Used: AM-PAC 6 Clicks Basic Mobility    Roxanne Gates, PT, DPT   Roxanne Gates 09/09/2017, 4:43 PM

## 2017-09-09 NOTE — Progress Notes (Signed)
   09/09/17 1700  Clinical Encounter Type  Visited With Patient;Patient and family together  Visit Type Initial;Other (Comment) (HCPOA)  Referral From [place] materials dropped off with patient.  [place] available to review as needed

## 2017-09-09 NOTE — Telephone Encounter (Signed)
Spoke with pt and wife regarding results. Pt is currently taking the prescribed Diflucan but wanted to make an appt to discuss the reflux. Pt recently admitted at [facility] for aspiration pneumonia.

## 2017-09-09 NOTE — Progress Notes (Signed)
Initial Nutrition Assessment  DOCUMENTATION CODES:   Severe malnutrition in context of chronic illness  INTERVENTION:   - Magic cup TID with meals, each supplement provides 290 kcal and 9 grams of protein - Encourage PO intake  NUTRITION DIAGNOSIS:   Severe Malnutrition related to chronic illness(dysphagia, tonsillar cancer s/p radiation, COPD) as evidenced by severe muscle depletion, severe fat depletion.  GOAL:   Patient will meet greater than or equal to 90% of their needs  MONITOR:   PO intake, Supplement acceptance, Weight trends  REASON FOR ASSESSMENT:   Malnutrition Screening Tool   ASSESSMENT:   62 year old male with a PMH significant for recurrent episodes of aspiration pneumonia secondary to esophageal dysphagia, tonsillar cancer s/p radiatation, GERD, and COPD. Pt with previous use of PEG tube while undergoing treatment, but PEG removed 9 months ago. Pt admitted for sepsis, aspiration pneumonia, and acute respiratory failure with hypoxia.  Spoke with pt at bedside. Pt reports having a "great appetite" and always being hungry. Pt with meal tray at time of visit. Per MD note, speech recommended NPO due to high risk of aspiration. Because pt wants to eat and does not want PEG placement, MD placed pt on mechanical soft diet with nectar-thick liquids.  Pt reports usually eating 4-5 meals daily at home and "snacking throughout the day" on chips and dip, cheese, and sausage. Per MD note, pt stated he eats "mostly vegetables" at home.  Pt reports his UBW as 150 lbs and that he knows he has lost weight. Per pt's weight history in chart, the last time pt weighed above 150 lbs was in September 2017. Pt has lost 20 lbs in 13 months (13.9% weight loss, not significant for timeframe).  Spoke with SLP after meeting with pt. Per SLP, pt has had episodes of increased heartburn that occur a few weeks after esophageal dilatation procedure. Concern for pt aspirating reflux regurgitations.  Per SLP, pt following a soft diet at home and appropriately thickens liquids.  Medications reviewed.  Labs reviewed: glucose 113 (H), calcium 8.2 (L)  NUTRITION - FOCUSED PHYSICAL EXAM:    Most Recent Value  Orbital Region  Severe depletion  Upper Arm Region  Severe depletion  Thoracic and Lumbar Region  Severe depletion  Buccal Region  Moderate depletion  Temple Region  Severe depletion  Clavicle Bone Region  Severe depletion  Clavicle and Acromion Bone Region  Severe depletion  Scapular Bone Region  Unable to assess  Dorsal Hand  Moderate depletion  Patellar Region  Severe depletion  Anterior Thigh Region  Severe depletion  Posterior Calf Region  Severe depletion  Edema (RD Assessment)  None  Hair  Reviewed  Eyes  Reviewed  Mouth  Reviewed  Skin  Reviewed  Nails  Reviewed       Diet Order:  DIET DYS 3 Room service appropriate? Yes; Fluid consistency: Nectar Thick  EDUCATION NEEDS:   No education needs have been identified at this time  Skin:  Skin Assessment: Reviewed RN Assessment  Last BM:  PTA  Height:   Ht Readings from Last 1 Encounters:  09/08/17 6' (1.829 m)    Weight:   Wt Readings from Last 1 Encounters:  09/08/17 124 lb 6.4 oz (56.4 kg)    Ideal Body Weight:  80.9 kg  BMI:  Body mass index is 16.87 kg/m.  Estimated Nutritional Needs:   Kcal:  1800-2000 kcal/day  Protein:  70-80 grams/day  Fluid:  1.8-2.0 L/day    Gaynell Face, MS,  RD, LDN Pager: [phone] Weekend/After Hours: [phone]

## 2017-09-09 NOTE — Progress Notes (Signed)
Patient ID: Mark [name], male   DOB: 06/11/1956, 62 y.o.   MRN: [id number]  [place] PROGRESS NOTE  Mark [name] PXT:[id number] DOB: 12-30-1955 DOA: 09/08/2017 PCP: Secundino Ginger, PA-C  HPI/Subjective: Patient feeling okay today.  Had a shaking chill yesterday.  Looks like aspiration pneumonia on the chest x-ray.  Speech therapy to see him today.  Patient states his blood pressure is always on the lower side.  Wife confirms this.  States he eats mostly vegetables with his swallowing issue.  Objective: Vitals:   09/09/17 0035 09/09/17 0351  BP:  (!) 82/53  Pulse:  67  Resp:  17  Temp:  97.7 F (36.5 C)  SpO2: 95% 97%    Filed Weights   09/08/17 0946 09/08/17 1455  Weight: 56.7 kg (125 lb) 56.4 kg (124 lb 6.4 oz)    ROS: Review of Systems  Constitutional: Negative for chills and fever.  Eyes: Negative for blurred vision.  Respiratory: Negative for cough and shortness of breath.   Cardiovascular: Negative for chest pain.  Gastrointestinal: Negative for abdominal pain, constipation, diarrhea, nausea and vomiting.  Genitourinary: Negative for dysuria.  Musculoskeletal: Negative for joint pain.  Neurological: Negative for dizziness and headaches.   Exam: Physical Exam  Constitutional: He is oriented to person, place, and time.  HENT:  Nose: No mucosal edema.  Mouth/Throat: No oropharyngeal exudate or posterior oropharyngeal edema.  Eyes: Conjunctivae, EOM and lids are normal. Pupils are equal, round, and reactive to light.  Neck: No JVD present. Carotid bruit is not present. No edema present. No thyroid mass and no thyromegaly present.  Cardiovascular: S1 normal and S2 normal. Exam reveals no gallop.  No murmur heard. Pulses:      Dorsalis pedis pulses are 2+ on the right side, and 2+ on the left side.  Respiratory: No respiratory distress. He has decreased breath sounds in the right middle field, the right lower field and the left lower field. He has no  wheezes. He has rhonchi in the right middle field, the right lower field and the left lower field. He has no rales.  GI: Soft. Bowel sounds are normal. There is no tenderness.  Musculoskeletal:       Right ankle: He exhibits no swelling.       Left ankle: He exhibits no swelling.  Lymphadenopathy:    He has no cervical adenopathy.  Neurological: He is alert and oriented to person, place, and time. No cranial nerve deficit.  Skin: Skin is warm. No rash noted. Nails show no clubbing.  Psychiatric: He has a normal mood and affect.      Data Reviewed: Basic Metabolic Panel: Recent Labs  Lab 09/08/17 1015 09/09/17 0440  NA 134* 140  K 4.1 3.7  CL 96* 106  CO2 28 29  GLUCOSE 150* 113*  BUN 15 13  CREATININE 0.80 0.66  CALCIUM 8.6* 8.2*   Liver Function Tests: Recent Labs  Lab 09/08/17 1015  AST 30  ALT 15*  ALKPHOS 80  BILITOT 0.6  PROT 7.4  ALBUMIN 3.7   CBC: Recent Labs  Lab 09/08/17 1015 09/09/17 0440  WBC 17.1* 8.8  NEUTROABS 15.7*  --   HGB 12.2* 9.9*  HCT 36.8* 30.1*  MCV 90.5 92.3  PLT 205 152   Cardiac Enzymes: Recent Labs  Lab 09/08/17 1015  TROPONINI <0.03     Recent Results (from the past 240 hour(s))  Culture, sputum-assessment     Status: None   Collection Time:  09/08/17 10:15 AM  Result Value Ref Range Status   Specimen Description SPUTUM  Final   Special Requests NONE  Final   Sputum evaluation   Final    Sputum specimen not acceptable for testing.  Please recollect.   Performed at [facility], [address] [place], [place] [address]    Report Status 09/08/2017 FINAL  Final  Blood Culture (routine x 2)     Status: None (Preliminary result)   Collection Time: 09/08/17 10:16 AM  Result Value Ref Range Status   Specimen Description BLOOD LEFT ARM  Final   Special Requests   Final    BOTTLES DRAWN AEROBIC AND ANAEROBIC Blood Culture results may not be optimal due to an excessive volume of blood received in culture bottles    Culture   Final    NO GROWTH < 24 HOURS Performed at [facility], [address], [place], [place] [address]    Report Status PENDING  Incomplete  Blood Culture (routine x 2)     Status: None (Preliminary result)   Collection Time: 09/08/17 10:16 AM  Result Value Ref Range Status   Specimen Description BLOOD LFOA  Final   Special Requests   Final    BOTTLES DRAWN AEROBIC AND ANAEROBIC Blood Culture adequate volume   Culture   Final    NO GROWTH < 24 HOURS Performed at [facility], [address], [place], [place] [address]    Report Status PENDING  Incomplete     Studies: Dg Chest 2 View  Result Date: 09/09/2017 CLINICAL DATA:  Shortness of Breath EXAM: CHEST  2 VIEW COMPARISON:  09/08/2017 FINDINGS: Cardiac shadow is stable. Hyperinflation of the lungs is again identified. The previously seen somewhat nodular densities are again identified bilaterally. A diffuse increase in patchy interstitial infiltrate is seen particularly in the right mid lung. No sizable effusion is noted. IMPRESSION: Stable appearing changes from the prior exam. There is some increase in the degree of infiltrative change in the right mid lung when compared with the previous day. Given the history in the acute nature this may represent a component of aspiration pneumonia. Electronically Signed   By: Inez Catalina M.D.   On: 09/09/2017 07:48   Dg Chest Port 1 View  Result Date: 09/08/2017 CLINICAL DATA:  Dyspnea EXAM: PORTABLE CHEST 1 VIEW COMPARISON:  04/08/2017 chest radiograph. FINDINGS: Stable cardiomediastinal silhouette with normal heart size. No pneumothorax. No pleural effusion. There are patchy faint nodular opacities throughout both lungs, including a new approximately 2 cm irregular nodular opacity in the peripheral upper right lung. IMPRESSION: Patchy faint nodular opacities throughout both lungs including a new approximately 2 cm irregular nodular opacity in the peripheral upper right  lung. 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 A follow-up chest CT is recommended to evaluate the new nodular opacity in the right upper lung. Electronically Signed   By: Ilona Sorrel M.D.   On: 09/08/2017 10:10    Scheduled Meds: . ARIPiprazole  2 mg Oral Daily  . clonazePAM  1 mg Oral BID  . DULoxetine  60 mg Oral Daily  . fluconazole  100 mg Oral Daily  . heparin  5,000 Units Subcutaneous Q8H  . ipratropium-albuterol  3 mL Nebulization Q4H  . midodrine  2.5 mg Oral TID WC  . oxyCODONE  20 mg Oral QID  . traZODone  150 mg Oral QHS  . venlafaxine  37.5 mg Oral BID WC   Continuous Infusions: . sodium chloride 100 mL/hr at 09/09/17 0400  . piperacillin-tazobactam (ZOSYN)  IV 3.375 g (09/09/17 0600)  . sodium chloride Stopped (09/08/17 2215)  . vancomycin Stopped (09/09/17 0434)    Assessment/Plan:  1. Clinical sepsis, aspiration pneumonia, hypotension.  Patient's blood pressures on the lower side all the time.  Lower the rate of IV fluid hydration.  Solu-Medrol ordered.  Midodrine ordered.  Patient on empiric antibiotics with vancomycin and Zosyn.  We will send off flu swab and MRSA PCR.  Swallow evaluation. 2. COPD with chronic respiratory failure on 2 L of oxygen.  Nebulizer treatments and Solu-Medrol ordered. 3. Peripheral neuropathy and chronic pain on oxycodone 4. Anxiety depression on psychiatric medications 5. Weakness get physical therapy evaluation 6. 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 Disposition Plan: To be determined based on clinical course.  Consultants:  Speech therapy  Antibiotics:  Vancomycin  Zosyn  Time spent: 28 minutes  [place]

## 2017-09-09 NOTE — Evaluation (Signed)
Clinical/Bedside Swallow Evaluation Patient Details  Name: Mark [name] MRN: [id number] Date of Birth: 1955/10/26  Today's Date: 09/09/2017 Time: SLP Start Time (ACUTE ONLY): 0900 SLP Stop Time (ACUTE ONLY): 1000 SLP Time Calculation (min) (ACUTE ONLY): 60 min  Past Medical History:  Past Medical History:  Diagnosis Date  . Arthritis    left hand  . Aspiration pneumonia ([place])   . Cancer ([place])    tonsilal ca  . COPD (chronic obstructive pulmonary disease) ([place])   . Incontinence    night time, began recently  . Memory deficit 11/18/2014  . PEG tube 11/18/2014   removed summer 2017  . Peripheral neuropathy 11/18/2014  . Smokers' cough ([place])   . Tonsillar cancer, S/P surgery, chemotherapy XRT 2009-[facility] 11/18/2014  . Wears dentures    full upper and lower   Past Surgical History:  Past Surgical History:  Procedure Laterality Date  . APPENDECTOMY    . CHOLECYSTECTOMY  2007  . ESOPHAGEAL DILATION N/A 08/01/2016   Procedure: ESOPHAGEAL DILATION;  Surgeon: [name], MD;  Location: [place];  Service: Endoscopy;  Laterality: N/A;  . ESOPHAGEAL DILATION N/A 02/19/2017   Procedure: ESOPHAGEAL DILATION;  Surgeon: [name], MD;  Location: [place];  Service: Endoscopy;  Laterality: N/A;  . ESOPHAGEAL DILATION N/A 08/27/2017   Procedure: ESOPHAGEAL DILATION;  Surgeon: [name], MD;  Location: [place];  Service: Endoscopy;  Laterality: N/A;  . ESOPHAGOGASTRODUODENOSCOPY (EGD) WITH PROPOFOL N/A 01/24/2016   Procedure: ESOPHAGOGASTRODUODENOSCOPY (EGD) WITH gastric biopsy and esophageal dilation.;  Surgeon: [name], MD;  Location: [place];  Service: Endoscopy;  Laterality: N/A;  . ESOPHAGOGASTRODUODENOSCOPY (EGD) WITH PROPOFOL N/A 08/01/2016   Procedure: ESOPHAGOGASTRODUODENOSCOPY (EGD) WITH PROPOFOL;  Surgeon: [name], MD;  Location: [place];  Service: Endoscopy;  Laterality: N/A;  . ESOPHAGOGASTRODUODENOSCOPY  (EGD) WITH PROPOFOL N/A 02/19/2017   Procedure: ESOPHAGOGASTRODUODENOSCOPY (EGD) WITH PROPOFOL;  Surgeon: [name], MD;  Location: [place];  Service: Endoscopy;  Laterality: N/A;  . ESOPHAGOGASTRODUODENOSCOPY (EGD) WITH PROPOFOL N/A 08/27/2017   Procedure: ESOPHAGOGASTRODUODENOSCOPY (EGD) WITH PROPOFOL;  Surgeon: [name], MD;  Location: [place];  Service: Endoscopy;  Laterality: N/A;  . PEG PLACEMENT N/A 11/28/2015   Procedure: PERCUTANEOUS ENDOSCOPIC GASTROSTOMY (PEG) PLACEMENT;  Surgeon: [name], MD;  Location: [place];  Service: Endoscopy;  Laterality: N/A;  . PEG TUBE PLACEMENT  10/22/12   10/22/12 - [facility], 10/23/14 - [facility], Dr. Allen Norris, replaced  . TONSILLECTOMY     jan 2010--stage IV   HPI:   Pt is a 62 y.o. 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 He's reports being able to swallow soft foods and drinks Nectar liquids and has maintained an oral diet since removal of PEG. Pt is admitted for concern for aspiration pneumonia. Pt does have long-standing baseline dysphagia w/ increased risk for aspiration s/p tonsillar/throat Ca w/ surgery and XRT (and late effects of XRT) - this has been identified in multiple MBSSs/assessments in the past 5 years. Recommendation by ST services has been for NPO status w/ pt taking pleasure bites of puree post thorough oral care, frequent oral care to reduce oral bacteria, and maintain an OM and pharyngeal swallowing exercise POC at home. Primary nutrition/hydration was recommended to be by the PEG TFs/flushes.  Pt has alsways expressed his thoughts on wanting to eat/drink and stated he "just can't stop altogether" in the past.  Assessment / Plan / Recommendation Clinical Impression  Pt seen today per MD request to f/u w/  pt re: his swallowing status. Pt is knonw to this service per his h/o dysphagia. Pt is a 62 y.o. 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 He's reports being able to swallow soft foods and drinks Nectar liquids and has maintained an oral diet since removal of PEG. Pt is admitted for concern for aspiration pneumonia. Pt does have long-standing baseline dysphagia w/ increased risk for aspiration s/p tonsillar/throat Ca w/ surgery and XRT (and late effects of XRT) - this has been identified in multiple MBSSs/assessments in the past 5 years. Recommendation by ST services has been for NPO status w/ pt taking pleasure bites of puree post thorough oral care, frequent oral care to reduce oral bacteria, and maintain an OM and pharyngeal swallowing exercise POC at home. Primary nutrition/hydration was recommended to be by the PEG TFs/flushes. Pt has alsways expressed his thoughts on wanting to eat/drink and stated he "just can't stop altogether" in the past. Pt stated he has felt "somewhat satisfied" eating/drinking by mouth since the removal of the PEG. He feels he has been able to manage his risk for aspiration by "eating soft foods and drinking Nectar liquids". He endorses using recommended foods/drinks that are naturally a Nectar consistency as part of his diet and feels he "knows how to manage the food when he swallows", "or I just spit it out". Pt was consuming sips of Nectar liquids via cup while swallowing Pills w/ no overt s/s of aspiration(there has been concern for silent aspiration in the past). Pt also consumed trials of Puree w/ mild delayed cough and wet vocal quality x2 during intake. Spoke briefly w/ Wife on the phone who stated pt "does not thicken his coffee enough" at home. Pt indicated to MD and  SLP that he wants to eat and does not want a PEG tube placement again. MD placed pt on mechanical soft diet with nectar thick liquids as he takes at home per report. SLP discussed w/ pt the need to follow aspiration precautions as he and Wife have been instructed on over time, the need for frequent oral care post his meals he chooses and anytime during the day, the need to monitor any feelings of Esophageal dysmotility and REFLUX (d/t pt's c/o his "chest burning" recently post his Esophageal dilitation w/ GI) as pt would be at high risk for aspiration of Reflux material as well d/t poor Esophageal motility. Recommended f/u w/ GI for ongoing management/tx including monitoring of the candidiasis. Handouts given. MD/NSG and Dietician updated. Pt agreed.     SLP Visit Diagnosis: Dysphagia, pharyngeal phase (R13.13);Dysphagia, pharyngoesophageal phase (R13.14)    Aspiration Risk  Moderate aspiration risk;Risk for inadequate nutrition/hydration(as per past MBSSs/assessments; from Reflux)    Diet Recommendation  MD will place oral diet order per pt's request to take po's.        Other  Recommendations Recommended Consults: Consider GI evaluation(continue f/u w/ for tx/management) Oral Care Recommendations: Oral care QID;Patient independent with oral care(frequent oral care)   Follow up Recommendations None      Frequency and Duration (n/a)  (  n/a)       Prognosis Prognosis for Safe Diet Advancement: Guarded Barriers to Reach Goals: Severity of deficits;Time post onset(late effects of XRT, tonsillar Ca)      Swallow Study   General Date of Onset: 09/08/17 Type of Study: Bedside Swallow Evaluation Previous Swallow Assessment: multiple assessments including MBSSs in the past 5-6 years. Pt has been recommended to maintain an NPO status w/ alternative means of feeding as support; pt's PEG was removed ~10 months ago per pt's request and he has been taking an oral diet w/ the knonw risk for  aspiration. Pt is followed by GI for Esophageal dysmotility also and has had 2 dilitations in ~7 months. Diet Prior to this Study: Dysphagia 3 (soft);Nectar-thick liquids(per pt description/report) Temperature Spikes Noted: No(wbc not elevated) Respiratory Status: Nasal cannula(2 liters) History of Recent Intubation: No Behavior/Cognition: Alert;Cooperative;Pleasant mood Oral Cavity Assessment: Dry Oral Care Completed by SLP: Recent completion by staff Oral Cavity - Dentition: Dentures, top;Dentures, bottom(pt reported feeling "loose") Vision: Functional for self-feeding Self-Feeding Abilities: Able to feed self Patient Positioning: Upright in bed Baseline Vocal Quality: (gravely; min reduced breath support intermittently) Volitional Cough: Congested Volitional Swallow: Able to elicit    Oral/Motor/Sensory Function Overall Oral Motor/Sensory Function: Within functional limits(grossly)   Ice Chips Ice chips: Not tested   Thin Liquid Thin Liquid: Not tested    Nectar Thick Nectar Thick Liquid: (no immediate, overt s/s of aspiration noted) Presentation: Cup(3-4 trials w/ NSG while swallowing Pills)   Honey Thick Honey Thick Liquid: Not tested   Puree Puree: Impaired Presentation: Self Fed;Spoon(3-4 trials ) Oral Phase Impairments: (none) Oral Phase Functional Implications: (none) Pharyngeal Phase Impairments: Cough - Delayed;Wet Vocal Quality(congested cough) Other Comments: min    Solid   GO   Solid: Not tested         Orinda Kenner, MS, CCC-SLP Sebastyan Snod[place] 09/09/2017,2:54 PM

## 2017-09-09 NOTE — Consult Note (Signed)
Consultation Note Date: 09/09/17   Patient Name: Mark [name]  DOB: 09-Jul-1956  MRN: [id number]  Age / Sex: 62 y.o., male  PCP: Mark [name] Referring Physician: No att. providers found  Reason for Consultation: Establishing goals of care  HPI/Patient Profile: 62 y.o. male  with past medical history of tonsillar cancer s/p chemotherapy and radiation, dysphagia requiring PEG tube (removed 9 months ago), peripheral neuropathy, chronic pain, COPD, aspiration pneumonia, and arthritis admitted on 09/08/2017 with shortness of breath and weakness. In ED, patient with hypotension, hypoxia, and elevated WBC's. Chest xray concerning for aspiration pneumonia. Recent EGD with dilatation on 08/27/17. SLP has evaluated and recommending NPO due to high risk for aspiration. Per attending, patient declines feeding tube placement and is requesting to eat. Dysphagia 3 with nectar thick liquids initiated. Palliative medicine consultation for goals of care.   Clinical Assessment and Goals of Care: I have reviewed medical records, discussed with care team, and met with patient and wife Mark [name]) at bedside to discuss diagnosis, GOC, EOL wishes, disposition and options.  Introduced Palliative Medicine as specialized medical care for people living with serious illness. It focuses on providing relief from the symptoms and stress of a serious illness. The goal is to improve quality of life for both the patient and the family.  We discussed a brief life review of the patient. Lives home with wife, Mark [name]. They have two children. Patient and wife recall history of tonsillar cancer with chemo/radiation in 2009. He continued to have difficulty with swallowing and required a feeding tube. This was removed 9 months ago per wife. He has experienced two episodes of aspiration pneumonia, always following an esophageal dilatation.   Mr.  Mark [name] and his wife speak of him staying as active as possible at home. Always "tinkering" in the garage or on his truck. He is eager to get up and ambulate today.   We discussed hospital diagnoses and interventions. We discussed his diet including risk for continued aspiration. [name] is willing to take the risk and finds joy in eating. They thicken all liquids.   Advanced directives, concepts specific to code status, and artifical feeding and hydration were discussed. [name] did not complete AD packet last admission. Patient and wife are interested in completing this admission. Patient speaks of his wishes for aggressive medical interventions including resuscitation/life support short term. I encouraged him to continue discussing his wishes with wife and family.   [name] is hopeful he can return home tomorrow.   Questions and concerns were addressed.  Hard Choices booklet left for review. PMT contact information given.    SUMMARY OF RECOMMENDATIONS    FULL code/FULL scope  Spiritual consult to create advanced directive packet.   Encouraged patient/wife to continue discussions regarding EOL wishes. Hard choices copy given.  May benefit from continued support from outpatient palliative.   Code Status/Advance Care Planning:  Full code  Symptom Management:   Per attending  Palliative Prophylaxis:   Aspiration, Bowel Regimen, Delirium Protocol, Frequent Pain  Assessment and Oral Care  Additional Recommendations (Limitations, Scope, Preferences):  Full Scope Treatment  Psycho-social/Spiritual:   Desire for further Chaplaincy support:yes  Additional Recommendations: Caregiving  Support/Resources and Education on Hospice  Prognosis:   Unable to determine  Discharge Planning: To Be Determined      Primary Diagnoses: Present on Admission: . Acute respiratory failure with hypoxia ([place]) . Aspiration pneumonia ([place]) . Sepsis ()   I have reviewed the  medical record, interviewed the patient and family, and examined the patient. The following aspects are pertinent.  Past Medical History:  Diagnosis Date  . Arthritis    left hand  . Aspiration pneumonia ([place])   . Cancer ([place])    tonsilal ca  . COPD (chronic obstructive pulmonary disease) ([place])   . Incontinence    night time, began recently  . Memory deficit 11/18/2014  . PEG tube 11/18/2014   removed summer 2017  . Peripheral neuropathy 11/18/2014  . Smokers' cough ([place])   . Tonsillar cancer, S/P surgery, chemotherapy XRT 2009-followed at [facility] 11/18/2014  . Wears dentures    full upper and lower   Social History   Socioeconomic History  . Marital status: Married    Spouse name: None  . Number of children: None  . Years of education: None  . Highest education level: None  Social Needs  . Financial resource strain: None  . Food insecurity - worry: None  . Food insecurity - inability: None  . Transportation needs - medical: None  . Transportation needs - non-medical: None  Occupational History  . None  Tobacco Use  . Smoking status: Current Every Day Smoker    Years: 40.00  . Smokeless tobacco: Never Used  . Tobacco comment: was 2 PPD, now 2 cigs/day  Substance and Sexual Activity  . Alcohol use: No  . Drug use: No  . Sexual activity: None  Other Topics Concern  . 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 Liver cancer Mother   . Stroke Father   . Testicular cancer Father    Scheduled Meds:  Continuous Infusions:  PRN Meds:. Medications Prior to Admission:  Prior to Admission medications   Medication Sig Start Date End Date Taking? Authorizing Provider  ARIPiprazole (ABILIFY) 2 MG tablet Take 2 mg by mouth daily.    Yes [provider]  clonazePAM (KLONOPIN) 1 MG tablet Take 1 mg by mouth 2 (two) times daily.     Yes [provider]  DULoxetine (CYMBALTA) 60 MG capsule Take 60 mg by mouth daily.   Yes [provider]  fluconazole (DIFLUCAN) 100 MG tablet Take 1 tablet (100 mg total) by mouth daily for 14 days. 08/27/17 09/10/17 Yes [name], MD  Oxycodone HCl 20 MG TABS Take 20 mg by mouth 4 (four) times daily.    Yes [provider]  pantoprazole (PROTONIX) 40 MG tablet Take 40 mg by mouth at bedtime.    Yes [provider]  traZODone (DESYREL) 150 MG tablet Take 150 mg by mouth at bedtime.    Yes [provider]  venlafaxine (EFFEXOR) 37.5 MG tablet Take 37.5 mg by mouth 2 (two) times daily with a meal.    Yes [provider]  albuterol (PROVENTIL HFA;VENTOLIN HFA) 108 (90 BASE) MCG/ACT inhaler Inhale 2 puffs into the lungs every 6 (  six) hours as needed for wheezing or shortness of breath.     [provider]  guaiFENesin-dextromethorphan (ROBITUSSIN DM) 100-10 MG/5ML syrup Take 5 mLs by mouth every 4 (four) hours as needed for cough. Patient not taking: Reported on 08/24/2017 04/11/17   Henreitta Leber, MD  Ipratropium-Albuterol (COMBIVENT) 20-100 MCG/ACT AERS respimat Inhale 2 puffs into the lungs 2 (two) times daily.    [provider]  mometasone (NASONEX) 50 MCG/ACT nasal spray Place 2 sprays into both nostrils 2 (two) times daily.    [provider]   Allergies  Allergen Reactions  . Morphine And Related Nausea And Vomiting   Review of Systems  Physical Exam  Constitutional: He is oriented to person, place, and time. He is cooperative.  HENT:  Head: Normocephalic and atraumatic.  Cardiovascular: Regular rhythm.  Pulmonary/Chest: Effort normal.  Neurological: He is alert and oriented to person, place, and time.  Psychiatric: He has a normal mood and affect. His speech is normal and behavior is normal. Cognition and memory are normal.  Nursing note and vitals reviewed.  Vital Signs: BP (!) 114/59 (BP Location:  Right Arm)   Pulse 78   Temp 97.6 F (36.4 C) (Oral)   Resp 20   Ht 6' (1.829 m)   Wt 56.4 kg (124 lb 6.4 oz)   SpO2 94%   BMI 16.87 kg/m  Pain Assessment: 0-10   Pain Score: 0-No pain  SpO2: SpO2: 94 % O2 Device:SpO2: 94 % O2 Flow Rate: .O2 Flow Rate (L/min): 2 L/min  IO: Intake/output summary:  No intake or output data in the 24 hours ending 09/11/17 0925  LBM: Last BM Date: 09/07/17 Baseline Weight: Weight: 56.7 kg (125 lb) Most recent weight: Weight: 56.4 kg (124 lb 6.4 oz)     Palliative Assessment/Data: PPS 50%   Flowsheet Rows     Most Recent Value  Intake Tab  Referral Department  Hospitalist  Unit at Time of Referral  Med/Surg Unit  Palliative Care Primary Diagnosis  Sepsis/Infectious Disease  Date Notified  09/09/17  Palliative Care Type  Return patient Palliative Care  Reason for referral  Clarify Goals of Care  Date of Admission  09/08/17  Date first seen by Palliative Care  09/09/17  # of days Palliative referral response time  0 Day(s)  # of days IP prior to Palliative referral  1  Clinical Assessment  Palliative Performance Scale Score  50%  Psychosocial & Spiritual Assessment  Palliative Care Outcomes  Patient/Family meeting held?  Yes  Who was at the meeting?  patient and wife  Palliative Care Outcomes  Clarified goals of care, Provided psychosocial or spiritual support, ACP counseling assistance, Linked to palliative care logitudinal support      Time In: 1350 Time Out: 1500 Time Total: 81mn Greater than 50%  of this time was spent counseling and coordinating care related to the above assessment and plan.  Signed by:  MIhor Dow FNP-C Palliative Medicine Team  Phone: [phone]: [phone] Please contact Palliative Medicine Team phone at [phone] questions and concerns.  For individual provider: See AShea Evans

## 2017-09-10 DIAGNOSIS — E43 Unspecified severe protein-calorie malnutrition: Secondary | ICD-10-CM

## 2017-09-10 LAB — URINE CULTURE: Culture: NO GROWTH

## 2017-09-10 LAB — PROCALCITONIN: Procalcitonin: 2.63 ng/mL

## 2017-09-10 MED ORDER — LEVOFLOXACIN 750 MG PO TABS: 750.0000 mg | ORAL_TABLET | Freq: Every day | ORAL | 0 refills | Status: DC

## 2017-09-10 MED ORDER — FENTANYL CITRATE (PF) 100 MCG/2ML IJ SOLN
12.5000 ug | INTRAMUSCULAR | Status: DC | PRN
  Administered 2017-09-10: 12.5 ug via INTRAVENOUS
  Filled 2017-09-10: qty 2

## 2017-09-10 MED ORDER — MIDODRINE HCL 2.5 MG PO TABS: 2.5000 mg | ORAL_TABLET | Freq: Three times a day (TID) | ORAL | 0 refills | Status: DC

## 2017-09-10 MED ORDER — METRONIDAZOLE 500 MG PO TABS: 500.0000 mg | ORAL_TABLET | Freq: Three times a day (TID) | ORAL | 0 refills | Status: AC

## 2017-09-10 NOTE — Discharge Summary (Signed)
[facility] NAME: Mark [name]    MR#:  [id number]  DATE OF BIRTH:  August 21, 1955  DATE OF ADMISSION:  09/08/2017 ADMITTING PHYSICIAN: Vaughan Basta, MD  DATE OF DISCHARGE: 09/10/2017 12:25 PM  PRIMARY CARE PHYSICIAN: Secundino Ginger, PA-C    ADMISSION DIAGNOSIS:  Aspiration into airway [T17.908A] Sepsis, due to unspecified organism ([place]) [A41.9] Aspiration pneumonia, unspecified aspiration pneumonia type, unspecified laterality, unspecified part of lung ([place]) [J69.0]  DISCHARGE DIAGNOSIS:  Active Problems:   Aspiration pneumonia ([place])   Sepsis ([place])   Acute respiratory failure with hypoxia ([place])   Protein-calorie malnutrition, severe   SECONDARY DIAGNOSIS:   Past Medical History:  Diagnosis Date  . Arthritis    left hand  . Aspiration pneumonia ([place])   . Cancer ([place])    tonsilal ca  . COPD (chronic obstructive pulmonary disease) ([place])   . Incontinence    night time, began recently  . Memory deficit 11/18/2014  . PEG tube 11/18/2014   removed summer 2017  . Peripheral neuropathy 11/18/2014  . Smokers' cough ([place])   . Tonsillar cancer, S/P surgery, chemotherapy XRT 2009-followed at [facility] 11/18/2014  . Wears dentures    full upper and lower    HOSPITAL COURSE:   1.  Clinical sepsis, aspiration pneumonia and hypotension.  Patient's blood pressures on the lower side even at home.  I started midodrine and blood pressures have improved.  Patient was started on empiric antibiotics with vancomycin and Zosyn.  Flu swab negative.  Since MRSA PCR was negative vancomycin was discontinued.  Patient has failed swallow evaluations numerous times in the past.  Speech therapy can only recommend n.p.o.  The patient does not want a feeding tube.  The patient is a high risk for aspiration sepsis and death.  I prescribed Levaquin and Flagyl to go home with.  Aspiration events happen more frequently after esophageal stretching. 2.  COPD  with chronic respiratory failure on 2 L of oxygen.  Since lungs are clear Solu-Medrol was discontinued.  Continue nebulizer treatments and inhalers at home. 3.  Peripheral neuropathy and chronic pain on oxycodone 4.  Anxiety and depression on psychiatric medications 5.  Weakness.  Patient did well with physical therapy and walking on his own.  Outpatient lung conditioning program 6.  History of tonsillar cancer status post surgery chemotherapy and radiation  7.  Esophageal candidiasis finish up Diflucan course. 8.  GERD started on PPI 9.  Anemia of chronic disease  DISCHARGE CONDITIONS:   Satisfactory  CONSULTS OBTAINED:   None DRUG ALLERGIES:   Allergies  Allergen Reactions  . Morphine And Related Nausea And Vomiting    DISCHARGE MEDICATIONS:   Allergies as of 09/10/2017      Reactions   Morphine And Related Nausea And Vomiting      Medication List    TAKE these medications   albuterol 108 (90 Base) MCG/ACT inhaler Commonly known as:  PROVENTIL HFA;VENTOLIN HFA Inhale 2 puffs into the lungs every 6 (six) hours as needed for wheezing or shortness of breath.   ARIPiprazole 2 MG tablet Commonly known as:  ABILIFY Take 2 mg by mouth daily.   clonazePAM 1 MG tablet Commonly known as:  KLONOPIN Take 1 mg by mouth 2 (two) times daily.   DULoxetine 60 MG capsule Commonly known as:  CYMBALTA Take 60 mg by mouth daily.   fluconazole 100 MG tablet Commonly known as:  DIFLUCAN Take 1 tablet (100 mg total)  by mouth daily for 14 days.   guaiFENesin-dextromethorphan 100-10 MG/5ML syrup Commonly known as:  ROBITUSSIN DM Take 5 mLs by mouth every 4 (four) hours as needed for cough.   Ipratropium-Albuterol 20-100 MCG/ACT Aers respimat Commonly known as:  COMBIVENT Inhale 2 puffs into the lungs 2 (two) times daily.   levofloxacin 750 MG tablet Commonly known as:  LEVAQUIN Take 1 tablet (750 mg total) by mouth daily.   metroNIDAZOLE 500 MG tablet Commonly known as:   FLAGYL Take 1 tablet (500 mg total) by mouth 3 (three) times daily for 14 days.   midodrine 2.5 MG tablet Commonly known as:  PROAMATINE Take 1 tablet (2.5 mg total) by mouth 3 (three) times daily with meals.   mometasone 50 MCG/ACT nasal spray Commonly known as:  NASONEX Place 2 sprays into both nostrils 2 (two) times daily.   Oxycodone HCl 20 MG Tabs Take 20 mg by mouth 4 (four) times daily.   pantoprazole 40 MG tablet Commonly known as:  PROTONIX Take 40 mg by mouth at bedtime.   traZODone 150 MG tablet Commonly known as:  DESYREL Take 150 mg by mouth at bedtime.   venlafaxine 37.5 MG tablet Commonly known as:  EFFEXOR Take 37.5 mg by mouth 2 (two) times daily with a meal.        DISCHARGE INSTRUCTIONS:   Follow-up PMD 1 week  If you experience worsening of your admission symptoms, develop shortness of breath, life threatening emergency, suicidal or homicidal thoughts you must seek medical attention immediately by calling 911 or calling your MD immediately  if symptoms less severe.  You Must read complete instructions/literature along with all the possible adverse reactions/side effects for all the Medicines you take and that have been prescribed to you. Take any new Medicines after you have completely understood and accept all the possible adverse reactions/side effects.   Please note  You were cared for by a hospitalist during your hospital stay. If you have any questions about your discharge medications or the care you received while you were in the hospital after you are discharged, you can call the unit and asked to speak with the hospitalist on call if the hospitalist that took care of you is not available. Once you are discharged, your primary care physician will handle any further medical issues. Please note that NO REFILLS for any discharge medications will be authorized once you are discharged, as it is imperative that you return to your primary care physician (or  establish a relationship with a primary care physician if you do not have one) for your aftercare needs so that they can reassess your need for medications and monitor your lab values.    Today   CHIEF COMPLAINT:   Chief Complaint  Patient presents with  . Shortness of Breath    HISTORY OF PRESENT ILLNESS:  Mark [name]  is a 62 y.o. male presented with shortness of breath   VITAL SIGNS:  Blood pressure (!) 114/59, pulse 78, temperature 97.6 F (36.4 C), temperature source Oral, resp. rate 20, height 6' (1.829 m), weight 56.4 kg (124 lb 6.4 oz), SpO2 94 %.    PHYSICAL EXAMINATION:  GENERAL:  62 y.o.-year-old patient lying in the bed with no acute distress.  EYES: Pupils equal, round, reactive to light and accommodation. No scleral icterus. Extraocular muscles intact.  HEENT: Head atraumatic, normocephalic. Oropharynx and nasopharynx clear.  NECK:  Supple, no jugular venous distention. No thyroid enlargement, no tenderness.  LUNGS: Normal breath  sounds bilaterally, no wheezing, rales.  Right lung rhonchi. No use of accessory muscles of respiration.  CARDIOVASCULAR: S1, S2 normal. No murmurs, rubs, or gallops.  ABDOMEN: Soft, non-tender, non-distended. Bowel sounds present. No organomegaly or mass.  EXTREMITIES: No pedal edema, cyanosis, or clubbing.  NEUROLOGIC: Cranial nerves II through XII are intact. Muscle strength 5/5 in all extremities. Sensation intact. Gait not checked.  PSYCHIATRIC: The patient is alert and oriented x 3.  SKIN: No obvious rash, lesion, or ulcer.   DATA REVIEW:   CBC Recent Labs  Lab 09/09/17 0440  WBC 8.8  HGB 9.9*  HCT 30.1*  PLT 152    Chemistries  Recent Labs  Lab 09/08/17 1015 09/09/17 0440  NA 134* 140  K 4.1 3.7  CL 96* 106  CO2 28 29  GLUCOSE 150* 113*  BUN 15 13  CREATININE 0.80 0.66  CALCIUM 8.6* 8.2*  AST 30  --   ALT 15*  --   ALKPHOS 80  --   BILITOT 0.6  --     Cardiac Enzymes Recent Labs  Lab 09/08/17 1015   TROPONINI <0.03    Microbiology Results  Results for orders placed or performed during the hospital encounter of 09/08/17  Culture, sputum-assessment     Status: None   Collection Time: 09/08/17 10:15 AM  Result Value Ref Range Status   Specimen Description SPUTUM  Final   Special Requests NONE  Final   Sputum evaluation   Final    Sputum specimen not acceptable for testing.  Please recollect.   Performed at [facility], [address] [place], [place] [address]    Report Status 09/08/2017 FINAL  Final  Blood Culture (routine x 2)     Status: None (Preliminary result)   Collection Time: 09/08/17 10:16 AM  Result Value Ref Range Status   Specimen Description BLOOD LEFT ARM  Final   Special Requests   Final    BOTTLES DRAWN AEROBIC AND ANAEROBIC Blood Culture results may not be optimal due to an excessive volume of blood received in culture bottles   Culture   Final    NO GROWTH 2 DAYS Performed at [facility], [address], [place], [place] [address]    Report Status PENDING  Incomplete  Blood Culture (routine x 2)     Status: None (Preliminary result)   Collection Time: 09/08/17 10:16 AM  Result Value Ref Range Status   Specimen Description BLOOD LFOA  Final   Special Requests   Final    BOTTLES DRAWN AEROBIC AND ANAEROBIC Blood Culture adequate volume   Culture   Final    NO GROWTH 2 DAYS Performed at [facility], [address], [place], [place] [address]    Report Status PENDING  Incomplete  MRSA PCR Screening     Status: None   Collection Time: 09/09/17  8:46 AM  Result Value Ref Range Status   MRSA by PCR NEGATIVE NEGATIVE Final    Comment:        The GeneXpert MRSA Assay (FDA approved for NASAL specimens only), is one component of a comprehensive MRSA colonization surveillance program. It is not intended to diagnose MRSA infection nor to guide or monitor treatment for MRSA infections. Performed at [facility],  [address], [place], [place] [address]   Urine Culture     Status: None   Collection Time: 09/09/17  1:58 PM  Result Value Ref Range Status   Specimen Description  Final    URINE, RANDOM Performed at [facility], [address], [place], [place] [address]    Special Requests   Final    NONE Performed at [facility], [address], [place], [place] [address]    Culture   Final    NO GROWTH Performed at [facility], [place] [address], [place], [place] [address]    Report Status 09/10/2017 FINAL  Final    RADIOLOGY:  Dg Chest 2 View  Result Date: 09/09/2017 CLINICAL DATA:  Shortness of Breath EXAM: CHEST  2 VIEW COMPARISON:  09/08/2017 FINDINGS: Cardiac shadow is stable. Hyperinflation of the lungs is again identified. The previously seen somewhat nodular densities are again identified bilaterally. A diffuse increase in patchy interstitial infiltrate is seen particularly in the right mid lung. No sizable effusion is noted. IMPRESSION: Stable appearing changes from the prior exam. There is some increase in the degree of infiltrative change in the right mid lung when compared with the previous day. Given the history in the acute nature this may represent a component of aspiration pneumonia. Electronically Signed   By: Inez Catalina M.D.   On: 09/09/2017 07:48    Management plans discussed with the patient, family and they are in agreement.  CODE STATUS:     Code Status Orders  (From admission, onward)        Start     Ordered   09/08/17 1520  Full code  Continuous     09/08/17 1520    Code Status History    Date Active Date Inactive Code Status Order ID Comments User Context   04/06/2017 09:17 04/11/2017 17:48 Full Code [id number]  Theodoro Grist, MD ED   04/23/2015 13:01 04/25/2015 17:57 Full Code [id number]  Loletha Grayer, MD ED   11/18/2014 17:52 11/21/2014 15:04 Full Code [id number]  Nita Sells, MD Inpatient      TOTAL TIME TAKING CARE OF  THIS PATIENT: 35 minutes.    Loletha Grayer M.D on 09/10/2017 at 4:19 PM  Between 7am to 6pm - Pager - [phone]  After 6pm go to [email or web address] - Proofreader  [employer] Office  [phone]  CC: Primary care physician; Secundino Ginger, PA-C

## 2017-09-10 NOTE — Progress Notes (Signed)
  Speech Language Pathology Treatment: Dysphagia  Patient Details Name: Mark [name] MRN: [id number] DOB: 1955/10/12 Today's Date: 09/10/2017 Time: 1100-1130 SLP Time Calculation (min) (ACUTE ONLY): 30 min  Assessment / Plan / Recommendation Clinical Impression  Met w/ pt and Wife present this session for ongoing education on general aspiration precautions; Reflux precautions. MD has ordered a PPI for pt. Pt and Wife to f/u w/ GI to address resolution of the candidiasis.  Education and handouts given; pt able to verbally recall precautions. Wife asked questions about the Reflux precautions, and pt stated he had bed risers at home for his bed. Discussed other Reflux precautions. Supplies given for the diet ordered by MD of the Dysphagia level 3 w/ Nectar liquids. Pt will continue to w/ an oral diet w/ strict precautions understanding the risk for aspiration as explained by MD/SLP.  ST services will be available for any further questions while admitted. Pt and Wife appreciative. NSG updated.    HPI HPI: Pt is a 62 y.o. 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 He's reports being able to swallow soft foods and drinks Nectar liquids and has maintained an oral diet since removal of PEG. Pt is admitted for concern for aspiration pneumonia. Pt does have long-standing baseline dysphagia w/ increased risk for aspiration s/p tonsillar/throat Ca w/ surgery and XRT (and late effects of XRT) - this has been identified in multiple MBSSs/assessments in the past 5 years. Recommendation by ST services has been for NPO status w/ pt taking pleasure bites of puree post thorough oral care, frequent oral care to reduce oral bacteria, and maintain an OM and pharyngeal swallowing exercise POC at home. Primary  nutrition/hydration was recommended to be by the PEG TFs/flushes. Pt has alsways expressed his thoughts on wanting to eat/drink and stated he "just can't stop altogether" in the past. MD has ordered a dysphaiga level 3 diet w/ Nectar liquids for pt.      SLP Plan  All goals met       Recommendations  Diet recommendations: (per MD order: Dysphagia level 3 w/ Nectar liquids) Liquids provided via: Cup;No straw Medication Administration: Whole meds with puree Supervision: Patient able to self feed Compensations: Minimize environmental distractions;Slow rate;Small sips/bites;Lingual sweep for clearance of pocketing;Multiple dry swallows after each bite/sip;Follow solids with liquid Postural Changes and/or Swallow Maneuvers: Seated upright 90 degrees;Upright 30-60 min after meal                General recommendations: (f/u w/ a Dietician as needed) Oral Care Recommendations: Oral care QID;Patient independent with oral care Follow up Recommendations: None SLP Visit Diagnosis: Dysphagia, pharyngeal phase (R13.13);Dysphagia, pharyngoesophageal phase (R13.14) Plan: All goals met       GO                 Mark Kenner, MS, CCC-SLP Mark [name] 09/10/2017, 1:35 PM

## 2017-09-10 NOTE — Care Management (Signed)
Discharge to home today per Dr. Leslye Peer. Physical therapy evaluation completed. Recommending outpatient physical therapy and [facility].  Spoke with Mr. Garner at the bedside. States he is not interested in coming to Outpatient physical therapy.  Would like to attend [facility] Dr. Leslye Peer updated.  Family will transport Mark Ammons RN MSN CCM Care management [phone]

## 2017-09-10 NOTE — Discharge Instructions (Signed)
Aspiration Pneumonia °Aspiration pneumonia is an infection in your lungs. It occurs when food, liquid, or stomach contents (vomit) are inhaled (aspirated) into your lungs. When these things get into your lungs, swelling (inflammation) and infection can occur. This can make it difficult for you to breathe. Aspiration pneumonia is a serious condition and can be life threatening. °What increases the risk? °Aspiration pneumonia is more likely to occur when a person's cough (gag) reflex or ability to swallow has been decreased. Some things that can do this include: °· Having a brain injury or disease, such as stroke, seizures, Parkinson's disease, dementia, or amyotrophic lateral sclerosis (ALS). °· Being given general anesthetic for procedures. °· Being in a coma (unconscious). °· Having a narrowing of the tube that carries food to the stomach (esophagus). °· Drinking too much alcohol. If a person passes out and vomits, vomit can be swallowed into the lungs. °· Taking certain medicines, such as tranquilizers or sedatives. ° °What are the signs or symptoms? °· Coughing after swallowing food or liquids. °· Breathing problems, such as wheezing or shortness of breath. °· Bluish skin. This can be caused by lack of oxygen. °· Coughing up food or mucus. The mucus might contain blood, greenish material, or yellowish-white fluid (pus). °· Fever. °· Chest pain. °· Being more tired than usual (fatigue). °· Sweating more than usual. °· Bad breath. °How is this diagnosed? °A physical exam will be done. During the exam, the health care provider will listen to your lungs with a stethoscope to check for: °· Crackling sounds in the lungs. °· Decreased breath sounds. °· A rapid heartbeat. ° °Various tests may be ordered. These may include: °· Chest X-ray. °· CT scan. °· Swallowing study. This test looks at how food is swallowed and whether it goes into your breathing tube (trachea) or food pipe (esophagus). °· Sputum culture. Saliva and  mucus (sputum) are collected from the lungs or the tubes that carry air to the lungs (bronchi). The sputum is then tested for bacteria. °· Bronchoscopy. This test uses a flexible tube (bronchoscope) to see inside the lungs. ° °How is this treated? °Treatment will usually include antibiotic medicines. Other medicines may also be used to reduce fever or pain. You may need to be treated in the hospital. In the hospital, your breathing will be carefully monitored. Depending on how well you are breathing, you may need to be given oxygen, or you may need breathing support from a breathing machine (ventilator). For people who fail a swallowing study, a feeding tube might be placed in the stomach, or they may be asked to avoid certain food textures or liquids when they eat. °Follow these instructions at home: °· Carefully follow any special eating instructions you were given, such as avoiding certain food textures or thickening liquids. This reduces the risk of developing aspiration pneumonia again. °· Only take over-the-counter or prescription medicines as directed by your health care provider. Follow the directions carefully. °· If you were prescribed antibiotics, take them as directed. Finish them even if you start to feel better. °· Rest as instructed by your health care provider. °· Keep all follow-up appointments with your health care provider. °Contact a health care provider if: °· You develop worsening shortness of breath, wheezing, or difficulty breathing. °· You develop a fever. °· You have chest pain. °This information is not intended to replace advice given to you by your health care provider. Make sure you discuss any questions you have with your health care   provider. °Document Released: 06/01/2009 Document Revised: 01/16/2016 Document Reviewed: 01/20/2013 °Elsevier Interactive Patient Education © 2017 [employer] ° °

## 2017-09-10 NOTE — Progress Notes (Signed)
Pt for discharge home  A/o. No distress. amb in hall w/o o2 maintained  sats in 90s. Instructions discussed with pt  Diet / meds activity and f/u. Verbalizes understanding.

## 2017-09-11 DIAGNOSIS — Z515 Encounter for palliative care: Secondary | ICD-10-CM

## 2017-09-13 LAB — CULTURE, BLOOD (ROUTINE X 2)
CULTURE: NO GROWTH
Culture: NO GROWTH
Special Requests: ADEQUATE

## 2017-09-14 ENCOUNTER — Telehealth: Payer: Self-pay

## 2017-09-14 NOTE — Telephone Encounter (Signed)
Called Mark [name] to see if he was interested in Pulmonary Rehab. Left message for him to call back. He has not seen his pulmonologist in about 2 years. Patient needs a referring physician for attending rehab.

## 2017-09-21 ENCOUNTER — Ambulatory Visit (INDEPENDENT_AMBULATORY_CARE_PROVIDER_SITE_OTHER): Payer: Medicaid Other | Admitting: Gastroenterology

## 2017-09-21 ENCOUNTER — Encounter: Payer: Self-pay | Admitting: Gastroenterology

## 2017-09-21 VITALS — BP 121/67 | HR 65 | Ht 72.0 in | Wt 123.0 lb

## 2017-09-21 DIAGNOSIS — B3781 Candidal esophagitis: Secondary | ICD-10-CM

## 2017-09-21 NOTE — Progress Notes (Signed)
Primary Care Physician: Secundino Ginger, PA-C  Primary Gastroenterologist:  Dr. [name]  No chief complaint on file.   HPI: Mark [name] is a 62 y.o. male here for follow-up after having an upper endoscopy in January of dysuria.  The patient had esophageal candidiasis seen and proven by cytology. The patient was undergoing the procedure for dysphagia.  The patient is on inhalers for a history of lung disease.  The patient was treated with Diflucan for the candidiasis.  Of note is the patient has had a drop in his hemoglobin recently with his hemoglobin now being 9.9 with the hemoglobin being over 12 two weeks ago.  Current Outpatient Medications  Medication Sig Dispense Refill  . albuterol (PROVENTIL HFA;VENTOLIN HFA) 108 (90 BASE) MCG/ACT inhaler Inhale 2 puffs into the lungs every 6 (six) hours as needed for wheezing or shortness of breath.     . ARIPiprazole (ABILIFY) 2 MG tablet Take 2 mg by mouth daily.     . clonazePAM (KLONOPIN) 1 MG tablet Take 1 mg by mouth 2 (two) times daily.     . DULoxetine (CYMBALTA) 60 MG capsule Take 60 mg by mouth daily.    Marland Kitchen guaiFENesin-dextromethorphan (ROBITUSSIN DM) 100-10 MG/5ML syrup Take 5 mLs by mouth every 4 (four) hours as needed for cough. (Patient not taking: Reported on 08/24/2017) 118 mL 0  . Ipratropium-Albuterol (COMBIVENT) 20-100 MCG/ACT AERS respimat Inhale 2 puffs into the lungs 2 (two) times daily.    Marland Kitchen levofloxacin (LEVAQUIN) 750 MG tablet Take 1 tablet (750 mg total) by mouth daily. 5 tablet 0  . metroNIDAZOLE (FLAGYL) 500 MG tablet Take 1 tablet (500 mg total) by mouth 3 (three) times daily for 14 days. 17 tablet 0  . midodrine (PROAMATINE) 2.5 MG tablet Take 1 tablet (2.5 mg total) by mouth 3 (three) times daily with meals. 90 tablet 0  . mometasone (NASONEX) 50 MCG/ACT nasal spray Place 2 sprays into both nostrils 2 (two) times daily.    . Oxycodone HCl 20 MG TABS Take 20 mg by mouth 4 (four) times daily.     . pantoprazole  (PROTONIX) 40 MG tablet Take 40 mg by mouth at bedtime.     . traZODone (DESYREL) 150 MG tablet Take 150 mg by mouth at bedtime.     Marland Kitchen venlafaxine (EFFEXOR) 37.5 MG tablet Take 37.5 mg by mouth 2 (two) times daily with a meal.      No current facility-administered medications for this visit.     Allergies as of 09/21/2017 - Review Complete 09/08/2017  Allergen Reaction Noted  . Morphine and related Nausea And Vomiting 11/18/2014    ROS:  General: Negative for anorexia, weight loss, fever, chills, fatigue, weakness. ENT: Negative for hoarseness, difficulty swallowing , nasal congestion. CV: Negative for chest pain, angina, palpitations, dyspnea on exertion, peripheral edema.  Respiratory: Negative for dyspnea at rest, dyspnea on exertion, cough, sputum, wheezing.  GI: See history of present illness. GU:  Negative for dysuria, hematuria, urinary incontinence, urinary frequency, nocturnal urination.  Endo: Negative for unusual weight change.    Physical Examination:   There were no vitals taken for this visit.  General: Well-nourished, well-developed in no acute distress.  Eyes: No icterus. Conjunctivae pink. Mouth: Oropharyngeal mucosa moist and pink , no lesions erythema or exudate. Lungs: Clear to auscultation bilaterally. Non-labored. Heart: Regular rate and rhythm, no murmurs rubs or gallops.  Abdomen: Bowel sounds are normal, nontender, nondistended, no hepatosplenomegaly or masses, no abdominal  bruits or hernia , no rebound or guarding.   Extremities: No lower extremity edema. No clubbing or deformities. Neuro: Alert and oriented x 3.  Grossly intact. Skin: Warm and dry, no jaundice.   Psych: Alert and cooperative, normal mood and affect.  Labs:    Imaging Studies: Dg Chest 2 View  Result Date: 09/09/2017 CLINICAL DATA:  Shortness of Breath EXAM: CHEST  2 VIEW COMPARISON:  09/08/2017 FINDINGS: Cardiac shadow is stable. Hyperinflation of the lungs is again identified. The  previously seen somewhat nodular densities are again identified bilaterally. A diffuse increase in patchy interstitial infiltrate is seen particularly in the right mid lung. No sizable effusion is noted. IMPRESSION: Stable appearing changes from the prior exam. There is some increase in the degree of infiltrative change in the right mid lung when compared with the previous day. Given the history in the acute nature this may represent a component of aspiration pneumonia. Electronically Signed   By: Inez Catalina M.D.   On: 09/09/2017 07:48   Dg Chest Port 1 View  Result Date: 09/08/2017 CLINICAL DATA:  Dyspnea EXAM: PORTABLE CHEST 1 VIEW COMPARISON:  04/08/2017 chest radiograph. FINDINGS: Stable cardiomediastinal silhouette with normal heart size. No pneumothorax. No pleural effusion. There are patchy faint nodular opacities throughout both lungs, including a new approximately 2 cm irregular nodular opacity in the peripheral upper right lung. IMPRESSION: Patchy faint nodular opacities throughout both lungs including a new approximately 2 cm irregular nodular opacity in the peripheral upper right lung. 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 A follow-up chest CT is recommended to evaluate the new nodular opacity in the right upper lung. Electronically Signed   By: Ilona Sorrel M.D.   On: 09/08/2017 10:10    Assessment and Plan:   Mark [name] is a 62 y.o. y/o male who has a recent EGD for dysphagia that showed him to have candidal esophagitis.  The patient was treated with Diflucan. The patient has had a significant drop in his hemoglobin recently. The patient reports that he was in the hospital recently for aspiration pneumonia and reports that when his hemoglobin dropped.  He is now eating better than he has a long time and states that he ate a brisket  this morning.  The patient has been told to rinse out his mouth and drink water after he uses his steroid nasal spray.  If the patient has dysphagia again he may need to be treated for candidal esophagitis prior to undergoing another EGD.  The patient has been explained the plan and agrees with it.    [name], MD. Marval Regal   Note: This dictation was prepared with Dragon dictation along with smaller phrase technology. Any transcriptional errors that result from this process are unintentional.

## 2018-03-17 ENCOUNTER — Telehealth: Payer: Self-pay | Admitting: Gastroenterology

## 2018-03-17 NOTE — Telephone Encounter (Signed)
Pt left vm stating he was waiting on a call to schedule an apt , called him back and left vm for him to call offcie

## 2018-03-17 NOTE — Telephone Encounter (Signed)
Pt left vm

## 2018-03-18 NOTE — Telephone Encounter (Signed)
Did he ever call back to schedule appt? Not sure what appt he is referring to. I have no follow ups with him currently.

## 2018-03-19 ENCOUNTER — Other Ambulatory Visit: Payer: Self-pay

## 2018-03-19 DIAGNOSIS — R1310 Dysphagia, unspecified: Secondary | ICD-10-CM

## 2018-03-19 DIAGNOSIS — R1319 Other dysphagia: Secondary | ICD-10-CM

## 2018-03-25 ENCOUNTER — Encounter: Payer: Self-pay | Admitting: *Deleted

## 2018-03-26 ENCOUNTER — Encounter
Admission: RE | Disposition: A | Discharge status: Home / Self Care | Payer: Self-pay | Source: Ambulatory Visit | Attending: Gastroenterology

## 2018-03-26 ENCOUNTER — Ambulatory Visit: Payer: Medicaid Other | Admitting: Anesthesiology

## 2018-03-26 ENCOUNTER — Ambulatory Visit
Admission: RE | Admit: 2018-03-26 | Discharge: 2018-03-26 | Disposition: A | Discharge status: Home / Self Care | Payer: Medicaid Other | Source: Ambulatory Visit | Attending: Gastroenterology | Admitting: Gastroenterology

## 2018-03-26 ENCOUNTER — Encounter: Payer: Self-pay | Admitting: Anesthesiology

## 2018-03-26 DIAGNOSIS — Z79899 Other long term (current) drug therapy: Secondary | ICD-10-CM | POA: Diagnosis not present

## 2018-03-26 DIAGNOSIS — M1990 Unspecified osteoarthritis, unspecified site: Secondary | ICD-10-CM | POA: Diagnosis not present

## 2018-03-26 DIAGNOSIS — L83 Acanthosis nigricans: Secondary | ICD-10-CM | POA: Insufficient documentation

## 2018-03-26 DIAGNOSIS — K224 Dyskinesia of esophagus: Secondary | ICD-10-CM | POA: Diagnosis not present

## 2018-03-26 DIAGNOSIS — R1319 Other dysphagia: Secondary | ICD-10-CM

## 2018-03-26 DIAGNOSIS — J449 Chronic obstructive pulmonary disease, unspecified: Secondary | ICD-10-CM | POA: Diagnosis not present

## 2018-03-26 DIAGNOSIS — B3781 Candidal esophagitis: Secondary | ICD-10-CM | POA: Diagnosis not present

## 2018-03-26 DIAGNOSIS — R1310 Dysphagia, unspecified: Secondary | ICD-10-CM

## 2018-03-26 DIAGNOSIS — Z885 Allergy status to narcotic agent status: Secondary | ICD-10-CM | POA: Diagnosis not present

## 2018-03-26 DIAGNOSIS — K229 Disease of esophagus, unspecified: Secondary | ICD-10-CM | POA: Diagnosis not present

## 2018-03-26 DIAGNOSIS — Z8589 Personal history of malignant neoplasm of other organs and systems: Secondary | ICD-10-CM | POA: Insufficient documentation

## 2018-03-26 HISTORY — PX: ESOPHAGOGASTRODUODENOSCOPY (EGD) WITH PROPOFOL: SHX5813

## 2018-03-26 LAB — KOH PREP

## 2018-03-26 SURGERY — ESOPHAGOGASTRODUODENOSCOPY (EGD) WITH PROPOFOL
Anesthesia: General

## 2018-03-26 MED ORDER — SODIUM CHLORIDE 0.9 % IV SOLN
INTRAVENOUS | Status: DC
  Administered 2018-03-26: 1000 mL via INTRAVENOUS

## 2018-03-26 MED ORDER — LIDOCAINE HCL (PF) 1 % IJ SOLN
2.0000 mL | Freq: Once | INTRAMUSCULAR | Status: AC
  Administered 2018-03-26: 0.3 mL via INTRADERMAL

## 2018-03-26 MED ORDER — LIDOCAINE HCL (PF) 1 % IJ SOLN
INTRAMUSCULAR | Status: AC
  Administered 2018-03-26: 0.3 mL via INTRADERMAL
  Filled 2018-03-26: qty 2

## 2018-03-26 MED ORDER — PHENYLEPHRINE HCL 10 MG/ML IJ SOLN
INTRAMUSCULAR | Status: DC | PRN
  Administered 2018-03-26: 200 ug via INTRAVENOUS
  Administered 2018-03-26: 100 ug via INTRAVENOUS

## 2018-03-26 MED ORDER — PROPOFOL 500 MG/50ML IV EMUL
INTRAVENOUS | Status: DC | PRN
  Administered 2018-03-26: 175 ug/kg/min via INTRAVENOUS

## 2018-03-26 MED ORDER — PROPOFOL 500 MG/50ML IV EMUL
INTRAVENOUS | Status: AC
  Filled 2018-03-26: qty 50

## 2018-03-26 MED ORDER — PROPOFOL 10 MG/ML IV BOLUS
INTRAVENOUS | Status: DC | PRN
  Administered 2018-03-26: 60 mg via INTRAVENOUS

## 2018-03-26 MED ORDER — LIDOCAINE HCL (CARDIAC) PF 100 MG/5ML IV SOSY
PREFILLED_SYRINGE | INTRAVENOUS | Status: DC | PRN
  Administered 2018-03-26: 50 mg via INTRAVENOUS

## 2018-03-26 NOTE — Anesthesia Preprocedure Evaluation (Signed)
Anesthesia Evaluation  Patient identified by MRN, date of birth, ID band Patient awake    Reviewed: Allergy & Precautions, NPO status , Patient's Chart, lab work & pertinent test results, reviewed documented beta blocker date and time   Airway Mallampati: II  TM Distance: >3 FB     Dental  (+) Upper Dentures, Lower Dentures   Pulmonary pneumonia, resolved, COPD, Current Smoker,           Cardiovascular      Neuro/Psych  Neuromuscular disease    GI/Hepatic   Endo/Other    Renal/GU      Musculoskeletal  (+) Arthritis ,   Abdominal   Peds  Hematology   Anesthesia Other Findings Tonsillar ca. Esophageal stricture. ARF hx. Hx of aspiration pneumonia. Echo 60 in 2018.  Reproductive/Obstetrics                             Anesthesia Physical Anesthesia Plan  ASA: III  Anesthesia Plan: General   Post-op Pain Management:    Induction: Intravenous  PONV Risk Score and Plan:   Airway Management Planned:   Additional Equipment:   Intra-op Plan:   Post-operative Plan:   Informed Consent: I have reviewed the patients History and Physical, chart, labs and discussed the procedure including the risks, benefits and alternatives for the proposed anesthesia with the patient or authorized representative who has indicated his/her understanding and acceptance.     Plan Discussed with: CRNA  Anesthesia Plan Comments:         Anesthesia Quick Evaluation

## 2018-03-26 NOTE — Op Note (Signed)
[facility] Patient Name: Mark [name] Procedure Date: 03/26/2018 10:23 AM MRN: [id number] Account #: [id number] Date of Birth: [birth year] Admit Type: Outpatient Age: 62 Room: [facility] ENDO ROOM 4 Gender: Male Note Status: Finalized Procedure:            Upper GI endoscopy Indications:          Dysphagia Providers:            Jonathon Bellows MD, MD Referring MD:         Otelia Limes. Lennice Sites PA, PA (Referring MD) Medicines:            Monitored Anesthesia Care Complications:        No immediate complications. Procedure:            Pre-Anesthesia Assessment:                       - Prior to the procedure, a History and Physical was                        performed, and patient medications, allergies and                        sensitivities were reviewed. The patient's tolerance of                        previous anesthesia was reviewed.                       - The risks and benefits of the procedure and the                        sedation options and risks were discussed with the                        patient. All questions were answered and informed                        consent was obtained.                       - ASA Grade Assessment: III - A patient with severe                        systemic disease.                       After obtaining informed consent, the endoscope was                        passed under direct vision. Throughout the procedure,                        the patient's blood pressure, pulse, and oxygen                        saturations were monitored continuously. The Endoscope                        was introduced through the mouth, and advanced to the  third part of duodenum. The upper GI endoscopy was                        accomplished with ease. The patient tolerated the                        procedure well. Findings:      The examined duodenum was normal.      The stomach was normal.      Patchy, white  plaques were found in the entire esophagus. Brushings were       obtained in the entire esophagus.      Localized mild mucosal changes characterized by erythema were found in       the lower third of the esophagus. Biopsies were taken with a cold       forceps for histology. The abnormal appearing mucosa was a tiny island       just proximal to rthe GE junction. Normal Z line      Abnormal motility was noted in the esophagus. The cricopharyngeus was       abnormal. There is a decrease in motility of the esophageal body.      Normal mucosa was found in the entire esophagus. Biopsies were obtained       from the proximal and distal esophagus with cold forceps for histology       of suspected eosinophilic esophagitis. Impression:           - Normal examined duodenum.                       - Normal stomach.                       - Esophageal plaques were found, suspicious for                        candidiasis. Brushings performed.                       - Erythematous mucosa in the esophagus. Biopsied.                       - Abnormal esophageal motility.                       - Normal mucosa was found in the entire esophagus.                        Biopsied. Recommendation:       - Discharge patient to home (with escort).                       - Resume previous diet.                       - Continue present medications.                       - Await pathology results.                       - Return to GI office in 4 weeks. Procedure Code(s):    --- Professional ---  22482, Esophagogastroduodenoscopy, flexible, transoral;                        with biopsy, single or multiple Diagnosis Code(s):    --- Professional ---                       K22.9, Disease of esophagus, unspecified                       K22.8, Other specified diseases of esophagus                       K22.4, Dyskinesia of esophagus                       R13.10, Dysphagia, unspecified CPT copyright 2017  American Medical Association. All rights reserved. The codes documented in this report are preliminary and upon coder review may  be revised to meet current compliance requirements. Jonathon Bellows, MD Jonathon Bellows MD, MD 03/26/2018 10:47:04 AM This report has been signed electronically. Number of Addenda: 0 Note Initiated On: 03/26/2018 10:23 AM      [facility]

## 2018-03-26 NOTE — H&P (Signed)
Mark Bellows, MD [address], [place], [place], [place], [address] [address], [place], [place], [place], [address] Phone: [phone]  Fax: [phone]  Primary Care Physician:  Secundino Ginger, PA-C   Pre-Procedure History & Physical: HPI:  Mark [name] is a 62 y.o. male is here for an endoscopy    Past Medical History:  Diagnosis Date  . Arthritis    left hand  . Aspiration pneumonia ([place])   . Cancer ([place])    tonsilal ca  . COPD (chronic obstructive pulmonary disease) ([place])   . Incontinence    night time, began recently  . Memory deficit 11/18/2014  . PEG tube 11/18/2014   removed summer 2017  . Peripheral neuropathy 11/18/2014  . Smokers' cough ([place])   . Tonsillar cancer, S/P surgery, chemotherapy XRT 2009-followed at [facility] 11/18/2014  . Wears dentures    full upper and lower    Past Surgical History:  Procedure Laterality Date  . APPENDECTOMY    . CHOLECYSTECTOMY  2007  . ESOPHAGEAL DILATION N/A 08/01/2016   Procedure: ESOPHAGEAL DILATION;  Surgeon: [name], MD;  Location: [place];  Service: Endoscopy;  Laterality: N/A;  . ESOPHAGEAL DILATION N/A 02/19/2017   Procedure: ESOPHAGEAL DILATION;  Surgeon: [name], MD;  Location: [place];  Service: Endoscopy;  Laterality: N/A;  . ESOPHAGEAL DILATION N/A 08/27/2017   Procedure: ESOPHAGEAL DILATION;  Surgeon: [name], MD;  Location: [place];  Service: Endoscopy;  Laterality: N/A;  . ESOPHAGOGASTRODUODENOSCOPY (EGD) WITH PROPOFOL N/A 01/24/2016   Procedure: ESOPHAGOGASTRODUODENOSCOPY (EGD) WITH gastric biopsy and esophageal dilation.;  Surgeon: [name], MD;  Location: [place];  Service: Endoscopy;  Laterality: N/A;  . ESOPHAGOGASTRODUODENOSCOPY (EGD) WITH PROPOFOL N/A 08/01/2016   Procedure: ESOPHAGOGASTRODUODENOSCOPY (EGD) WITH PROPOFOL;  Surgeon: [name], MD;  Location: [place];  Service: Endoscopy;  Laterality: N/A;  .  ESOPHAGOGASTRODUODENOSCOPY (EGD) WITH PROPOFOL N/A 02/19/2017   Procedure: ESOPHAGOGASTRODUODENOSCOPY (EGD) WITH PROPOFOL;  Surgeon: [name], MD;  Location: [place];  Service: Endoscopy;  Laterality: N/A;  . ESOPHAGOGASTRODUODENOSCOPY (EGD) WITH PROPOFOL N/A 08/27/2017   Procedure: ESOPHAGOGASTRODUODENOSCOPY (EGD) WITH PROPOFOL;  Surgeon: [name], MD;  Location: [place];  Service: Endoscopy;  Laterality: N/A;  . PEG PLACEMENT N/A 11/28/2015   Procedure: PERCUTANEOUS ENDOSCOPIC GASTROSTOMY (PEG) PLACEMENT;  Surgeon: [name], MD;  Location: [place];  Service: Endoscopy;  Laterality: N/A;  . PEG TUBE PLACEMENT  10/22/12   10/22/12 - [facility], 10/23/14 - [facility], Dr. Allen Norris, replaced  . 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 Authorizing Provider  albuterol (PROVENTIL HFA;VENTOLIN HFA) 108 (90 BASE) MCG/ACT inhaler Inhale 2 puffs into the lungs every 6 (six) hours as needed for wheezing or shortness of breath.     [provider]  ARIPiprazole (ABILIFY) 2 MG tablet Take 2 mg by mouth daily.     [provider]  clonazePAM (KLONOPIN) 1 MG tablet Take 1 mg by mouth 2 (two) times daily.     [provider]  DULoxetine (CYMBALTA) 60 MG capsule Take 60 mg by mouth daily.    [provider]  guaiFENesin-dextromethorphan (ROBITUSSIN DM) 100-10 MG/5ML syrup Take 5 mLs by mouth every 4 (four) hours as needed for cough. Patient not taking: Reported on 08/24/2017 04/11/17   Henreitta Leber, MD  Ipratropium-Albuterol (COMBIVENT) 20-100 MCG/ACT AERS respimat Inhale 2  puffs into the lungs 2 (two) times daily.    [provider]  levofloxacin (LEVAQUIN) 750 MG tablet Take 1 tablet (750 mg total) by mouth daily. 09/10/17   Loletha Grayer, MD  midodrine (PROAMATINE) 2.5 MG tablet Take 1 tablet (2.5 mg total) by mouth 3 (three) times daily with meals. 09/10/17    Loletha Grayer, MD  mometasone (NASONEX) 50 MCG/ACT nasal spray Place 2 sprays into both nostrils 2 (two) times daily.    [provider]  Oxycodone HCl 20 MG TABS Take 20 mg by mouth 4 (four) times daily.     [provider]  pantoprazole (PROTONIX) 40 MG tablet Take 40 mg by mouth at bedtime.     [provider]  traZODone (DESYREL) 150 MG tablet Take 150 mg by mouth at bedtime.     [provider]  venlafaxine (EFFEXOR) 37.5 MG tablet Take 37.5 mg by mouth 2 (two) times daily with a meal.     [provider]    Allergies as of 03/19/2018 - Review Complete 09/21/2017  Allergen Reaction Noted  . Morphine and related Nausea And Vomiting 11/18/2014    Family History  Problem Relation Age of Onset  . Liver cancer Mother   . Stroke Father   . Testicular cancer Father     Social History   Socioeconomic History  . Marital status: Married    Spouse name: Not on file  . Number of children: Not on file  . Years of education: Not on file  . Highest education level: Not on file  Occupational History  . Not on file  Social Needs  . Financial resource strain: Not on file  . Food insecurity:    Worry: Not on file    Inability: Not on file  . Transportation needs:    Medical: Not on file    Non-medical: Not on file  Tobacco Use  . Smoking status: Current Every Day Smoker    Years: 40.00  . Smokeless tobacco: Never Used  . Tobacco comment: was 2 PPD, now 2 cigs/day  Substance and Sexual Activity  . Alcohol use: No  . Drug use: No  . Sexual activity: Not on file  Lifestyle  . Physical activity:    Days per week: Not on file    Minutes per session: Not on file  . Stress: Not on file  Relationships  . Social connections:    Talks on phone: Not on file    Gets together: Not on file    Attends religious service: Not on file    Active member of club or organization: Not on file    Attends meetings of clubs or organizations: Not on  file    Relationship status: Not on file  . Intimate partner violence:    Fear of current or ex partner: Not on file    Emotionally abused: Not on file    Physically abused: Not on file    Forced sexual activity: Not on file  Other Topics Concern  . 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 General:   Alert,  pleasant and cooperative in NAD Head:  Normocephalic and atraumatic. Neck:  Supple; no masses or thyromegaly. Lungs:  Clear throughout to auscultation, normal  respiratory effort.    Heart:  +S1, +S2, Regular rate and rhythm, No edema. Abdomen:  Soft, nontender and nondistended. Normal bowel sounds, without guarding, and without rebound.   Neurologic:  Alert and  oriented x4;  grossly normal neurologically.  Impression/Plan: Mark [name] is here for an endoscopy  to be performed for  evaluation of dysphagia    Risks, benefits, limitations, and alternatives regarding endoscopy+/- dilation have been reviewed with the patient.  Questions have been answered.  All parties agreeable.   Mark Bellows, MD  03/26/2018, 10:22 AM

## 2018-03-26 NOTE — Anesthesia Postprocedure Evaluation (Signed)
Anesthesia Post Note  Patient: Mark [name]  Procedure(s) Performed: ESOPHAGOGASTRODUODENOSCOPY (EGD) WITH PROPOFOL (N/A )  Patient location during evaluation: Endoscopy Anesthesia Type: General Level of consciousness: awake and alert Pain management: pain level controlled Vital Signs Assessment: post-procedure vital signs reviewed and stable Respiratory status: spontaneous breathing, nonlabored ventilation, respiratory function stable and patient connected to nasal cannula oxygen Cardiovascular status: blood pressure returned to baseline and stable Postop Assessment: no apparent nausea or vomiting Anesthetic complications: no     Last Vitals:  Vitals:   03/26/18 1108 03/26/18 1118  BP: 112/75 110/69  Pulse: 92 89  Resp: 12 14  Temp:    SpO2: 95% 93%    Last Pain:  Vitals:   03/26/18 1118  TempSrc:   PainSc: 0-No pain                 Talaysha Freeberg S

## 2018-03-26 NOTE — Transfer of Care (Signed)
Immediate Anesthesia Transfer of Care Note  Patient: Mark [name]  Procedure(s) Performed: ESOPHAGOGASTRODUODENOSCOPY (EGD) WITH PROPOFOL (N/A )  Patient Location: PACU  Anesthesia Type:General  Level of Consciousness: drowsy  Airway & Oxygen Therapy: Patient Spontanous Breathing and Patient connected to nasal cannula oxygen  Post-op Assessment: Report given to RN and Post -op Vital signs reviewed and stable  Post vital signs: Reviewed and stable  Last Vitals:  Vitals Value Taken Time  BP 92/70 03/26/2018 10:48 AM  Temp 36.3 C 03/26/2018 10:48 AM  Pulse 86 03/26/2018 10:48 AM  Resp 7 03/26/2018 10:48 AM  SpO2 100 % 03/26/2018 10:48 AM    Last Pain:  Vitals:   03/26/18 1048  TempSrc: Tympanic  PainSc: 0-No pain         Complications: No apparent anesthesia complications

## 2018-03-26 NOTE — Anesthesia Post-op Follow-up Note (Signed)
Anesthesia QCDR form completed.        

## 2018-03-26 NOTE — Anesthesia Procedure Notes (Signed)
Date/Time: 03/26/2018 10:34 AM Performed by: Johnna Acosta, CRNA Pre-anesthesia Checklist: Patient identified, Emergency Drugs available, Suction available, Patient being monitored and Timeout performed Patient Re-evaluated:Patient Re-evaluated prior to induction Oxygen Delivery Method: Nasal cannula Preoxygenation: Pre-oxygenation with 100% oxygen

## 2018-03-31 ENCOUNTER — Telehealth: Payer: Self-pay

## 2018-03-31 DIAGNOSIS — B3781 Candidal esophagitis: Secondary | ICD-10-CM

## 2018-03-31 LAB — SURGICAL PATHOLOGY

## 2018-03-31 MED ORDER — FLUCONAZOLE 200 MG PO TABS: 200.0000 mg | ORAL_TABLET | Freq: Every day | ORAL | 0 refills | Status: AC

## 2018-03-31 MED ORDER — FLUCONAZOLE 200 MG PO TABS: 200.0000 mg | ORAL_TABLET | Freq: Every day | ORAL | 0 refills | Status: DC

## 2018-03-31 NOTE — Telephone Encounter (Signed)
-----   Message from Jonathon Bellows, MD sent at 03/31/2018 11:30 AM EDT ----- Sharyn Lull inform patient - features of reflux seen on bx and candida (yeast infection), suggest diflucan 200 mg once daily for 14 days , ensure taking PPI

## 2018-03-31 NOTE — Telephone Encounter (Signed)
Patient has been informed of results.  Prescription for Diflucan has been faxed to pharmacy.  Patient stated that he does take Omeprazole 40 mg.  Thanks [employer]

## 2018-04-14 ENCOUNTER — Telehealth: Payer: Self-pay | Admitting: Gastroenterology

## 2018-04-14 NOTE — Telephone Encounter (Signed)
LVM to schedule EGD.

## 2018-04-14 NOTE — Telephone Encounter (Signed)
Returned patients call regarding scheduling for repeat EGD. He is Dr. Dorothey Baseman patient. Discussed with Dr. Vicente Males  (He performed EGD 2 weeks ago).  Dr. Vicente Males informed me that patient was treated with Diflucan for 2 weeks due to esophagitis and needs a follow up with either him or Dr. Allen Norris to decide if repeat EGD is needed.  Dr. Allen Norris does not have any availability in the next two weeks for follow up so he will be following up with Dr. Vicente Males on 04/20/18 at 2pm.  [place]

## 2018-04-20 ENCOUNTER — Encounter: Payer: Self-pay | Admitting: *Deleted

## 2018-04-20 ENCOUNTER — Other Ambulatory Visit: Payer: Self-pay

## 2018-04-20 ENCOUNTER — Encounter: Payer: Self-pay | Admitting: Gastroenterology

## 2018-04-20 ENCOUNTER — Ambulatory Visit (INDEPENDENT_AMBULATORY_CARE_PROVIDER_SITE_OTHER): Payer: Medicaid Other | Admitting: Gastroenterology

## 2018-04-20 VITALS — BP 117/83 | HR 87 | Ht 72.0 in | Wt 117.6 lb

## 2018-04-20 DIAGNOSIS — R4702 Dysphasia: Secondary | ICD-10-CM | POA: Diagnosis not present

## 2018-04-20 DIAGNOSIS — R1310 Dysphagia, unspecified: Secondary | ICD-10-CM

## 2018-04-20 NOTE — Progress Notes (Signed)
Primary Care Physician: Secundino Ginger, PA-C  Primary Gastroenterologist:  Dr. [name]  Chief Complaint  Patient presents with  . Follow up dysphagia    HPI: Mark [name] is a 62 y.o. male here for follow-up after having an EGD by Dr. Vicente Males.  The patient was found to have candidal esophagitis.  The patient was treated but states his dysphagia has not improved.  He also reports that he is losing weight.  The patient states that he has felt better after having his esophagus stretched on past endoscopies.  The patient biopsy did not show any sign of eosinophilic esophagitis.  Current Outpatient Medications  Medication Sig Dispense Refill  . albuterol (PROVENTIL HFA;VENTOLIN HFA) 108 (90 BASE) MCG/ACT inhaler Inhale 2 puffs into the lungs every 6 (six) hours as needed for wheezing or shortness of breath.     . ARIPiprazole (ABILIFY) 2 MG tablet Take 2 mg by mouth daily.     . clonazePAM (KLONOPIN) 1 MG tablet Take 1 mg by mouth 2 (two) times daily.     . DULoxetine (CYMBALTA) 60 MG capsule Take 60 mg by mouth daily.    . Ipratropium-Albuterol (COMBIVENT) 20-100 MCG/ACT AERS respimat Inhale 2 puffs into the lungs 2 (two) times daily.    . mometasone (NASONEX) 50 MCG/ACT nasal spray Place 2 sprays into both nostrils 2 (two) times daily.    . Oxycodone HCl 20 MG TABS Take 20 mg by mouth 4 (four) times daily.     . pantoprazole (PROTONIX) 40 MG tablet Take 40 mg by mouth at bedtime.     . traZODone (DESYREL) 150 MG tablet Take 150 mg by mouth at bedtime.     Marland Kitchen venlafaxine (EFFEXOR) 37.5 MG tablet Take 37.5 mg by mouth 2 (two) times daily with a meal.      No current facility-administered medications for this visit.     Allergies as of 04/20/2018 - Review Complete 04/20/2018  Allergen Reaction Noted  . Morphine and related Nausea And Vomiting 11/18/2014    ROS:  General: Negative for anorexia, weight loss, fever, chills, fatigue, weakness. ENT: Negative for hoarseness,  difficulty swallowing , nasal congestion. CV: Negative for chest pain, angina, palpitations, dyspnea on exertion, peripheral edema.  Respiratory: Negative for dyspnea at rest, dyspnea on exertion, cough, sputum, wheezing.  GI: See history of present illness. GU:  Negative for dysuria, hematuria, urinary incontinence, urinary frequency, nocturnal urination.  Endo: Negative for unusual weight change.    Physical Examination:   BP 117/83   Pulse 87   Ht 6' (1.829 m)   Wt 117 lb 9.6 oz (53.3 kg)   BMI 15.95 kg/m   General: Skinny and poorly nourished well-developed in no acute distress.  Eyes: No icterus. Conjunctivae pink. Mouth: Oropharyngeal mucosa moist and pink , no lesions erythema or exudate. Lungs: Clear to auscultation bilaterally. Non-labored. Heart: Regular rate and rhythm, no murmurs rubs or gallops.  Abdomen: Bowel sounds are normal, nontender, nondistended, no hepatosplenomegaly or masses, no abdominal bruits or hernia , no rebound or guarding.   Extremities: No lower extremity edema. No clubbing or deformities. Neuro: Alert and oriented x 3.  Grossly intact. Skin: Warm and dry, no jaundice.   Psych: Alert and cooperative, normal mood and affect.  Labs:    Imaging Studies: No results found.  Assessment and Plan:   Mark [name] is a 62 y.o. y/o male who has a history of esophageal dysphasia with a history of having a  PEG tube inserted and has been since removed.  The patient had been doing well but reported some dysphasia and had an upper endoscopy with candidal esophagitis found and treated.  The patient continues to have dysphasia.  The patient will be set up for repeat EGD with dilation.  The patient has also been told to try and eat foods which are high in caloric intake to try and keep his weight up until the procedure.  I have discussed risks & benefits which include, but are not limited to, bleeding, infection, perforation & drug reaction.  The patient agrees with  this plan & written consent will be obtained.      [name], MD. Marval Regal   Note: This dictation was prepared with Dragon dictation along with smaller phrase technology. Any transcriptional errors that result from this process are unintentional.

## 2018-04-21 NOTE — Discharge Instructions (Signed)
General Anesthesia, Adult, Care After °These instructions provide you with information about caring for yourself after your procedure. Your health care provider may also give you more specific instructions. Your treatment has been planned according to current medical practices, but problems sometimes occur. Call your health care provider if you have any problems or questions after your procedure. °What can I expect after the procedure? °After the procedure, it is common to have: °· Vomiting. °· A sore throat. °· Mental slowness. ° °It is common to feel: °· Nauseous. °· Cold or shivery. °· Sleepy. °· Tired. °· Sore or achy, even in parts of your body where you did not have surgery. ° °Follow these instructions at home: °For at least 24 hours after the procedure: °· Do not: °? Participate in activities where you could fall or become injured. °? Drive. °? Use heavy machinery. °? Drink alcohol. °? Take sleeping pills or medicines that cause drowsiness. °? Make important decisions or sign legal documents. °? Take care of children on your own. °· Rest. °Eating and drinking °· If you vomit, drink water, juice, or soup when you can drink without vomiting. °· Drink enough fluid to keep your urine clear or pale yellow. °· Make sure you have little or no nausea before eating solid foods. °· Follow the diet recommended by your health care provider. °General instructions °· Have a responsible adult stay with you until you are awake and alert. °· Return to your normal activities as told by your health care provider. Ask your health care provider what activities are safe for you. °· Take over-the-counter and prescription medicines only as told by your health care provider. °· If you smoke, do not smoke without supervision. °· Keep all follow-up visits as told by your health care provider. This is important. °Contact a health care provider if: °· You continue to have nausea or vomiting at home, and medicines are not helpful. °· You  cannot drink fluids or start eating again. °· You cannot urinate after 8-12 hours. °· You develop a skin rash. °· You have fever. °· You have increasing redness at the site of your procedure. °Get help right away if: °· You have difficulty breathing. °· You have chest pain. °· You have unexpected bleeding. °· You feel that you are having a life-threatening or urgent problem. °This information is not intended to replace advice given to you by your health care provider. Make sure you discuss any questions you have with your health care provider. °Document Released: 11/10/2000 Document Revised: 01/07/2016 Document Reviewed: 07/19/2015 °Elsevier Interactive Patient Education © 2018 Elsevier Inc. ° °

## 2018-04-22 ENCOUNTER — Ambulatory Visit: Payer: Medicaid Other | Admitting: Anesthesiology

## 2018-04-22 ENCOUNTER — Ambulatory Visit
Admission: RE | Admit: 2018-04-22 | Discharge: 2018-04-22 | Disposition: A | Discharge status: Home / Self Care | Payer: Medicaid Other | Source: Ambulatory Visit | Attending: Gastroenterology | Admitting: Gastroenterology

## 2018-04-22 ENCOUNTER — Encounter
Admission: RE | Disposition: A | Discharge status: Home / Self Care | Payer: Self-pay | Source: Ambulatory Visit | Attending: Gastroenterology

## 2018-04-22 DIAGNOSIS — J449 Chronic obstructive pulmonary disease, unspecified: Secondary | ICD-10-CM | POA: Insufficient documentation

## 2018-04-22 DIAGNOSIS — Z79899 Other long term (current) drug therapy: Secondary | ICD-10-CM | POA: Insufficient documentation

## 2018-04-22 DIAGNOSIS — K222 Esophageal obstruction: Secondary | ICD-10-CM | POA: Insufficient documentation

## 2018-04-22 DIAGNOSIS — G629 Polyneuropathy, unspecified: Secondary | ICD-10-CM | POA: Insufficient documentation

## 2018-04-22 DIAGNOSIS — Z9221 Personal history of antineoplastic chemotherapy: Secondary | ICD-10-CM | POA: Insufficient documentation

## 2018-04-22 DIAGNOSIS — F17210 Nicotine dependence, cigarettes, uncomplicated: Secondary | ICD-10-CM | POA: Insufficient documentation

## 2018-04-22 DIAGNOSIS — R1310 Dysphagia, unspecified: Secondary | ICD-10-CM | POA: Diagnosis present

## 2018-04-22 DIAGNOSIS — Z85818 Personal history of malignant neoplasm of other sites of lip, oral cavity, and pharynx: Secondary | ICD-10-CM | POA: Diagnosis not present

## 2018-04-22 DIAGNOSIS — M1990 Unspecified osteoarthritis, unspecified site: Secondary | ICD-10-CM | POA: Insufficient documentation

## 2018-04-22 HISTORY — PX: ESOPHAGOSCOPY WITH DILITATION: SHX5618

## 2018-04-22 HISTORY — PX: ESOPHAGOGASTRODUODENOSCOPY (EGD) WITH PROPOFOL: SHX5813

## 2018-04-22 SURGERY — ESOPHAGOGASTRODUODENOSCOPY (EGD) WITH PROPOFOL
Anesthesia: General | Wound class: Clean Contaminated

## 2018-04-22 MED ORDER — INSULIN ASPART 100 UNIT/ML ~~LOC~~ SOLN
SUBCUTANEOUS | Status: AC
  Filled 2018-04-22: qty 1

## 2018-04-22 MED ORDER — GLYCOPYRROLATE 0.2 MG/ML IJ SOLN
INTRAMUSCULAR | Status: DC | PRN
  Administered 2018-04-22: 0.2 mg via INTRAVENOUS

## 2018-04-22 MED ORDER — LIDOCAINE HCL (CARDIAC) PF 100 MG/5ML IV SOSY
PREFILLED_SYRINGE | INTRAVENOUS | Status: DC | PRN
  Administered 2018-04-22: 40 mg via INTRAVENOUS

## 2018-04-22 MED ORDER — SODIUM CHLORIDE 0.9 % IV SOLN: INTRAVENOUS | Status: DC

## 2018-04-22 MED ORDER — ACETAMINOPHEN 325 MG PO TABS: 325.0000 mg | ORAL_TABLET | Freq: Once | ORAL | Status: DC

## 2018-04-22 MED ORDER — PROPOFOL 10 MG/ML IV BOLUS
INTRAVENOUS | Status: DC | PRN
  Administered 2018-04-22: 20 mg via INTRAVENOUS
  Administered 2018-04-22: 30 mg via INTRAVENOUS
  Administered 2018-04-22: 20 mg via INTRAVENOUS
  Administered 2018-04-22: 10 mg via INTRAVENOUS
  Administered 2018-04-22: 80 mg via INTRAVENOUS
  Administered 2018-04-22: 20 mg via INTRAVENOUS

## 2018-04-22 MED ORDER — ACETAMINOPHEN 160 MG/5ML PO SOLN: 325.0000 mg | Freq: Once | ORAL | Status: DC

## 2018-04-22 MED ORDER — DEXMEDETOMIDINE HCL 200 MCG/2ML IV SOLN
INTRAVENOUS | Status: DC | PRN
  Administered 2018-04-22: 6 ug via INTRAVENOUS

## 2018-04-22 MED ORDER — LACTATED RINGERS IV SOLN
INTRAVENOUS | Status: DC
  Administered 2018-04-22: 09:00:00 via INTRAVENOUS

## 2018-04-22 MED ORDER — EPHEDRINE SULFATE 50 MG/ML IJ SOLN
INTRAMUSCULAR | Status: DC | PRN
  Administered 2018-04-22: 10 mg via INTRAVENOUS

## 2018-04-22 SURGICAL SUPPLY — 9 items
BALLN DILATOR 12-15 8 (BALLOONS) ×3
BALLOON DILATOR 12-15 8 (BALLOONS) IMPLANT
BLOCK BITE 60FR ADLT L/F GRN (MISCELLANEOUS) ×3 IMPLANT
CANISTER SUCT 1200ML W/VALVE (MISCELLANEOUS) ×3 IMPLANT
GOWN CVR UNV OPN BCK APRN NK (MISCELLANEOUS) ×2 IMPLANT
GOWN ISOL THUMB LOOP REG UNIV (MISCELLANEOUS) ×6
KIT ENDO PROCEDURE OLY (KITS) ×3 IMPLANT
SYR INFLATION 60ML (SYRINGE) ×2 IMPLANT
WATER STERILE IRR 250ML POUR (IV SOLUTION) ×3 IMPLANT

## 2018-04-22 NOTE — Transfer of Care (Signed)
Immediate Anesthesia Transfer of Care Note  Patient: Mark [name]  Procedure(s) Performed: ESOPHAGOGASTRODUODENOSCOPY (EGD) WITH PROPOFOL (N/A ) ESOPHAGOSCOPY WITH DILITATION (N/A )  Patient Location: PACU  Anesthesia Type: General  Level of Consciousness: awake, alert  and patient cooperative  Airway and Oxygen Therapy: Patient Spontanous Breathing and Patient connected to supplemental oxygen  Post-op Assessment: Post-op Vital signs reviewed, Patient's Cardiovascular Status Stable, Respiratory Function Stable, Patent Airway and No signs of Nausea or vomiting  Post-op Vital Signs: Reviewed and stable  Complications: No apparent anesthesia complications

## 2018-04-22 NOTE — Anesthesia Procedure Notes (Signed)
Procedure Name: MAC Date/Time: 04/22/2018 9:44 AM Performed by: Janna Arch, CRNA Pre-anesthesia Checklist: Patient identified, Emergency Drugs available, Suction available, Timeout performed and Patient being monitored Patient Re-evaluated:Patient Re-evaluated prior to induction Oxygen Delivery Method: Nasal cannula Placement Confirmation: positive ETCO2

## 2018-04-22 NOTE — Op Note (Signed)
[facility] Gastroenterology Patient Name: Mark [name] Procedure Date: 04/22/2018 9:38 AM MRN: [id number] Account #: [id number] Date of Birth: 1956-06-27 Admit Type: Inpatient Age: 62 Room: [facility] OR ROOM 01 Gender: Male Note Status: Finalized Procedure:            Upper GI endoscopy Indications:          Dysphagia Providers:            [name] MD, MD Referring MD:         Otelia Limes. Duran PA, PA (Referring MD) Medicines:            Propofol per Anesthesia Complications:        No immediate complications. Procedure:            Pre-Anesthesia Assessment:                       - Prior to the procedure, a History and Physical was                        performed, and patient medications and allergies were                        reviewed. The patient's tolerance of previous                        anesthesia was also reviewed. The risks and benefits of                        the procedure and the sedation options and risks were                        discussed with the patient. All questions were                        answered, and informed consent was obtained. Prior                        Anticoagulants: The patient has taken no previous                        anticoagulant or antiplatelet agents. ASA Grade                        Assessment: II - A patient with mild systemic disease.                        After reviewing the risks and benefits, the patient was                        deemed in satisfactory condition to undergo the                        procedure.                       After obtaining informed consent, the endoscope was                        passed under direct vision. Throughout the procedure,  the patient's blood pressure, pulse, and oxygen                        saturations were monitored continuously. The was                        introduced through the mouth, and advanced to the                        second part of  duodenum. The upper GI endoscopy was                        accomplished without difficulty. The patient tolerated                        the procedure well. Findings:      One benign-appearing, intrinsic moderate stenosis was found in the upper       third of the esophagus. The stenosis was traversed. A TTS dilator was       passed through the scope. Dilation with a 12-13.5-15 mm balloon dilator       was performed to 15 mm. The dilation site was examined following       endoscope reinsertion and showed complete resolution of luminal       narrowing.      The stomach was normal.      The examined duodenum was normal. Impression:           - Benign-appearing esophageal stenosis. Dilated.                       - Normal stomach.                       - Normal examined duodenum.                       - No specimens collected. Recommendation:       - Discharge patient to home.                       - Resume previous diet.                       - Continue present medications. Procedure Code(s):    --- Professional ---                       [id number], Esophagogastroduodenoscopy, flexible, transoral;                        with transendoscopic balloon dilation of esophagus                        (less than 30 mm diameter) Diagnosis Code(s):    --- Professional ---                       R13.10, Dysphagia, unspecified                       K22.2, Esophageal obstruction CPT copyright 2017 American Medical Association. All rights reserved. The codes documented in this report are preliminary and upon coder review may  be revised to meet current compliance requirements. [name]  Rudean Icenhour MD, MD 04/22/2018 9:55:24 AM This report has been signed electronically. Number of Addenda: 0 Note Initiated On: 04/22/2018 9:38 AM      [facility]

## 2018-04-22 NOTE — Anesthesia Preprocedure Evaluation (Addendum)
Anesthesia Evaluation  Patient identified by MRN, date of birth, ID band Patient awake    Reviewed: Allergy & Precautions, H&P , NPO status , Patient's Chart, lab work & pertinent test results, reviewed documented beta blocker date and time   Airway Mallampati: I  TM Distance: >3 FB Neck ROM: full    Dental  (+) Upper Dentures, Lower Dentures   Pulmonary COPD,  COPD inhaler and oxygen dependent, Current Smoker,  Hx of tosillar cancer, s/p XRT, chemo   Pulmonary exam normal breath sounds clear to auscultation       Cardiovascular Exercise Tolerance: Good negative cardio ROS Normal cardiovascular exam Rhythm:regular Rate:Normal     Neuro/Psych negative neurological ROS  negative psych ROS   GI/Hepatic negative GI ROS, Neg liver ROS,   Endo/Other  negative endocrine ROS  Renal/GU negative Renal ROS  negative genitourinary   Musculoskeletal  (+) Arthritis ,   Abdominal   Peds  Hematology negative hematology ROS (+)   Anesthesia Other Findings   Reproductive/Obstetrics negative OB ROS                            Anesthesia Physical  Anesthesia Plan  ASA: IV  Anesthesia Plan: General   Post-op Pain Management:    Induction: Intravenous  PONV Risk Score and Plan: 2 and Propofol infusion and Treatment may vary due to age or medical condition  Airway Management Planned: Natural Airway  Additional Equipment:   Intra-op Plan:   Post-operative Plan:   Informed Consent: I have reviewed the patients History and Physical, chart, labs and discussed the procedure including the risks, benefits and alternatives for the proposed anesthesia with the patient or authorized representative who has indicated his/her understanding and acceptance.   Dental Advisory Given  Plan Discussed with: CRNA and Anesthesiologist  Anesthesia Plan Comments:         Anesthesia Quick Evaluation

## 2018-04-22 NOTE — H&P (Signed)
[name], MD [address] [place], [place] [address] Phone:[phone] Fax : [phone]  Primary Care Physician:  Secundino Ginger, PA-C Primary Gastroenterologist:  Dr. Allen Norris  Pre-Procedure History & Physical: HPI:  Mark [name] is a 62 y.o. male is here for an endoscopy.   Past Medical History:  Diagnosis Date  . Arthritis    left hand  . Aspiration pneumonia ([place])   . Cancer ([place])    tonsilal ca  . COPD (chronic obstructive pulmonary disease) ([place])   . Incontinence    night time, began recently  . Memory deficit 11/18/2014  . PEG tube 11/18/2014   removed summer 2017  . Peripheral neuropathy 11/18/2014  . Smokers' cough ([place])   . Tonsillar cancer, S/P surgery, chemotherapy XRT 2009-followed at [facility] 11/18/2014  . Wears dentures    full upper and lower    Past Surgical History:  Procedure Laterality Date  . APPENDECTOMY    . CHOLECYSTECTOMY  2007  . ESOPHAGEAL DILATION N/A 08/01/2016   Procedure: ESOPHAGEAL DILATION;  Surgeon: [name], MD;  Location: [place];  Service: Endoscopy;  Laterality: N/A;  . ESOPHAGEAL DILATION N/A 02/19/2017   Procedure: ESOPHAGEAL DILATION;  Surgeon: [name], MD;  Location: [place];  Service: Endoscopy;  Laterality: N/A;  . ESOPHAGEAL DILATION N/A 08/27/2017   Procedure: ESOPHAGEAL DILATION;  Surgeon: [name], MD;  Location: [place];  Service: Endoscopy;  Laterality: N/A;  . ESOPHAGOGASTRODUODENOSCOPY (EGD) WITH PROPOFOL N/A 01/24/2016   Procedure: ESOPHAGOGASTRODUODENOSCOPY (EGD) WITH gastric biopsy and esophageal dilation.;  Surgeon: [name], MD;  Location: [place];  Service: Endoscopy;  Laterality: N/A;  . ESOPHAGOGASTRODUODENOSCOPY (EGD) WITH PROPOFOL N/A 08/01/2016   Procedure: ESOPHAGOGASTRODUODENOSCOPY (EGD) WITH PROPOFOL;  Surgeon: [name], MD;  Location: [place];  Service: Endoscopy;  Laterality: N/A;  . ESOPHAGOGASTRODUODENOSCOPY  (EGD) WITH PROPOFOL N/A 02/19/2017   Procedure: ESOPHAGOGASTRODUODENOSCOPY (EGD) WITH PROPOFOL;  Surgeon: [name], MD;  Location: [place];  Service: Endoscopy;  Laterality: N/A;  . ESOPHAGOGASTRODUODENOSCOPY (EGD) WITH PROPOFOL N/A 08/27/2017   Procedure: ESOPHAGOGASTRODUODENOSCOPY (EGD) WITH PROPOFOL;  Surgeon: [name], MD;  Location: [place];  Service: Endoscopy;  Laterality: N/A;  . ESOPHAGOGASTRODUODENOSCOPY (EGD) WITH PROPOFOL N/A 03/26/2018   Procedure: ESOPHAGOGASTRODUODENOSCOPY (EGD) WITH PROPOFOL;  Surgeon: Jonathon Bellows, MD;  Location: [facility] ENDOSCOPY;  Service: Gastroenterology;  Laterality: N/A;  . PEG PLACEMENT N/A 11/28/2015   Procedure: PERCUTANEOUS ENDOSCOPIC GASTROSTOMY (PEG) PLACEMENT;  Surgeon: [name], MD;  Location: [place];  Service: Endoscopy;  Laterality: N/A;  . PEG TUBE PLACEMENT  10/22/12   10/22/12 - [facility], 10/23/14 - [facility], Dr. Allen Norris, replaced  . 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 Authorizing Provider  albuterol (PROVENTIL HFA;VENTOLIN HFA) 108 (90 BASE) MCG/ACT inhaler Inhale 2 puffs into the lungs every 6 (six) hours as needed for wheezing or shortness of breath.    Yes [provider]  ARIPiprazole (ABILIFY) 2 MG tablet Take 2 mg by mouth daily.    Yes [provider]  clonazePAM (KLONOPIN) 1 MG tablet Take 1 mg by mouth 2 (two) times daily.    Yes [provider]  DULoxetine (CYMBALTA) 60 MG capsule Take 60 mg by mouth daily.   Yes [provider]  Ipratropium-Albuterol (COMBIVENT) 20-100 MCG/ACT AERS respimat Inhale 2 puffs into the lungs 2 (two) times daily.   Yes  [provider]  mometasone (NASONEX) 50 MCG/ACT nasal spray Place 2 sprays into both nostrils 2 (two) times daily.   Yes [provider]  Oxycodone HCl 20 MG TABS Take 20 mg by mouth 4 (four) times daily.    Yes [provider]  pantoprazole (PROTONIX) 40 MG tablet Take 40 mg by mouth at bedtime.    Yes [provider]  traZODone (DESYREL) 150 MG tablet Take 150 mg by mouth at bedtime.    Yes [provider]  venlafaxine (EFFEXOR) 37.5 MG tablet Take 37.5 mg by mouth 2 (two) times daily with a meal.    Yes [provider]    Allergies as of 04/20/2018 - Review Complete 04/20/2018  Allergen Reaction Noted  . Morphine and related Nausea And Vomiting 11/18/2014    Family History  Problem Relation Age of Onset  . Liver cancer Mother   . Stroke Father   . Testicular cancer Father     Social History   Socioeconomic History  . Marital status: Married    Spouse name: Not on file  . Number of children: Not on file  . Years of education: Not on file  . Highest education level: Not on file  Occupational History  . Not on file  Social Needs  . Financial resource strain: Not on file  . Food insecurity:    Worry: Not on file    Inability: Not on file  . Transportation needs:    Medical: Not on file    Non-medical: Not on file  Tobacco Use  . Smoking status: Current Every Day Smoker    Years: 40.00  . Smokeless tobacco: Never Used  . Tobacco comment: was 2 PPD, now 2 cigs/day  Substance and Sexual Activity  . Alcohol use: No  . Drug use: No  . Sexual activity: Not on file  Lifestyle  . Physical activity:    Days per week: Not on file    Minutes per session: Not on file  . Stress: Not on file  Relationships  . Social connections:    Talks on phone: Not on file    Gets together: Not on file    Attends religious service: Not on file    Active member of club or organization: Not on file    Attends meetings of clubs or organizations: Not on file    Relationship status: Not on file  . Intimate partner violence:    Fear of current or ex partner: Not on file    Emotionally abused: Not on file    Physically abused: Not on file    Forced sexual activity: Not on file  Other  Topics Concern  . 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 Neck:  Supple; no masses or thyromegaly. Lungs:  Clear throughout to auscultation.    Heart:  Regular rate and rhythm. Abdomen:  Soft, nontender and nondistended. Normal bowel sounds, without guarding, and without rebound.   Neurologic:  Alert  and  oriented x4;  grossly normal neurologically.  Impression/Plan: Mark [name] is here for an endoscopy to be performed for dysphagia  Risks, benefits, limitations, and alternatives regarding  endoscopy have been reviewed with the patient.  Questions have been answered.  All parties agreeable.   [name], MD  04/22/2018, 8:50 AM

## 2018-04-22 NOTE — Anesthesia Postprocedure Evaluation (Signed)
Anesthesia Post Note  Patient: Mark [name]  Procedure(s) Performed: ESOPHAGOGASTRODUODENOSCOPY (EGD) WITH PROPOFOL (N/A ) ESOPHAGOSCOPY WITH DILITATION (N/A )  Patient location during evaluation: PACU Anesthesia Type: General Level of consciousness: awake and alert and oriented Pain management: satisfactory to patient Vital Signs Assessment: post-procedure vital signs reviewed and stable Respiratory status: spontaneous breathing, nonlabored ventilation and respiratory function stable Cardiovascular status: blood pressure returned to baseline and stable Postop Assessment: Adequate PO intake and No signs of nausea or vomiting Anesthetic complications: no    Raliegh Ip

## 2018-04-23 ENCOUNTER — Encounter: Payer: Self-pay | Admitting: Gastroenterology

## 2018-05-03 ENCOUNTER — Encounter: Payer: Self-pay | Admitting: Emergency Medicine

## 2018-05-03 ENCOUNTER — Inpatient Hospital Stay
Admission: EM | Admit: 2018-05-03 | Discharge: 2018-05-05 | DRG: 871 | Disposition: A | Discharge status: Home / Self Care | Payer: Medicaid Other | Attending: Internal Medicine | Admitting: Internal Medicine

## 2018-05-03 ENCOUNTER — Other Ambulatory Visit: Payer: Self-pay

## 2018-05-03 ENCOUNTER — Emergency Department: Payer: Medicaid Other

## 2018-05-03 DIAGNOSIS — Z79891 Long term (current) use of opiate analgesic: Secondary | ICD-10-CM

## 2018-05-03 DIAGNOSIS — J449 Chronic obstructive pulmonary disease, unspecified: Secondary | ICD-10-CM | POA: Diagnosis present

## 2018-05-03 DIAGNOSIS — Z79899 Other long term (current) drug therapy: Secondary | ICD-10-CM

## 2018-05-03 DIAGNOSIS — R32 Unspecified urinary incontinence: Secondary | ICD-10-CM | POA: Diagnosis present

## 2018-05-03 DIAGNOSIS — Z7951 Long term (current) use of inhaled steroids: Secondary | ICD-10-CM | POA: Diagnosis not present

## 2018-05-03 DIAGNOSIS — Z9221 Personal history of antineoplastic chemotherapy: Secondary | ICD-10-CM

## 2018-05-03 DIAGNOSIS — Z681 Body mass index (BMI) 19 or less, adult: Secondary | ICD-10-CM | POA: Diagnosis not present

## 2018-05-03 DIAGNOSIS — Z8043 Family history of malignant neoplasm of testis: Secondary | ICD-10-CM

## 2018-05-03 DIAGNOSIS — I9589 Other hypotension: Secondary | ICD-10-CM | POA: Diagnosis present

## 2018-05-03 DIAGNOSIS — R1310 Dysphagia, unspecified: Secondary | ICD-10-CM | POA: Diagnosis present

## 2018-05-03 DIAGNOSIS — Z85818 Personal history of malignant neoplasm of other sites of lip, oral cavity, and pharynx: Secondary | ICD-10-CM

## 2018-05-03 DIAGNOSIS — F329 Major depressive disorder, single episode, unspecified: Secondary | ICD-10-CM | POA: Diagnosis present

## 2018-05-03 DIAGNOSIS — Z9049 Acquired absence of other specified parts of digestive tract: Secondary | ICD-10-CM

## 2018-05-03 DIAGNOSIS — F419 Anxiety disorder, unspecified: Secondary | ICD-10-CM | POA: Diagnosis present

## 2018-05-03 DIAGNOSIS — Z885 Allergy status to narcotic agent status: Secondary | ICD-10-CM

## 2018-05-03 DIAGNOSIS — J690 Pneumonitis due to inhalation of food and vomit: Secondary | ICD-10-CM | POA: Diagnosis present

## 2018-05-03 DIAGNOSIS — G629 Polyneuropathy, unspecified: Secondary | ICD-10-CM | POA: Diagnosis present

## 2018-05-03 DIAGNOSIS — Z823 Family history of stroke: Secondary | ICD-10-CM | POA: Diagnosis not present

## 2018-05-03 DIAGNOSIS — R6520 Severe sepsis without septic shock: Secondary | ICD-10-CM | POA: Diagnosis present

## 2018-05-03 DIAGNOSIS — F17210 Nicotine dependence, cigarettes, uncomplicated: Secondary | ICD-10-CM | POA: Diagnosis present

## 2018-05-03 DIAGNOSIS — E43 Unspecified severe protein-calorie malnutrition: Secondary | ICD-10-CM | POA: Diagnosis present

## 2018-05-03 DIAGNOSIS — Z923 Personal history of irradiation: Secondary | ICD-10-CM

## 2018-05-03 DIAGNOSIS — M19042 Primary osteoarthritis, left hand: Secondary | ICD-10-CM | POA: Diagnosis present

## 2018-05-03 DIAGNOSIS — A419 Sepsis, unspecified organism: Secondary | ICD-10-CM | POA: Diagnosis not present

## 2018-05-03 DIAGNOSIS — Z800 Family history of malignant neoplasm of digestive organs: Secondary | ICD-10-CM

## 2018-05-03 DIAGNOSIS — R4182 Altered mental status, unspecified: Secondary | ICD-10-CM | POA: Diagnosis not present

## 2018-05-03 DIAGNOSIS — G8929 Other chronic pain: Secondary | ICD-10-CM | POA: Diagnosis present

## 2018-05-03 LAB — EXPECTORATED SPUTUM ASSESSMENT W GRAM STAIN, RFLX TO RESP C: Special Requests: NORMAL

## 2018-05-03 LAB — PROCALCITONIN: Procalcitonin: 4.67 ng/mL

## 2018-05-03 LAB — URINALYSIS, COMPLETE (UACMP) WITH MICROSCOPIC
Bilirubin Urine: NEGATIVE
Glucose, UA: NEGATIVE mg/dL
HGB URINE DIPSTICK: NEGATIVE
Ketones, ur: NEGATIVE mg/dL
Leukocytes, UA: NEGATIVE
Nitrite: NEGATIVE
Protein, ur: NEGATIVE mg/dL
Specific Gravity, Urine: 1.015 (ref 1.005–1.030)
pH: 6 (ref 5.0–8.0)

## 2018-05-03 LAB — COMPREHENSIVE METABOLIC PANEL
ALBUMIN: 4.1 g/dL (ref 3.5–5.0)
ALT: 15 U/L (ref 0–44)
AST: 26 U/L (ref 15–41)
Alkaline Phosphatase: 66 U/L (ref 38–126)
Anion gap: 9 (ref 5–15)
BUN: 24 mg/dL — AB (ref 8–23)
CHLORIDE: 95 mmol/L — AB (ref 98–111)
CO2: 30 mmol/L (ref 22–32)
Calcium: 8.7 mg/dL — ABNORMAL LOW (ref 8.9–10.3)
Creatinine, Ser: 0.89 mg/dL (ref 0.61–1.24)
GFR calc non Af Amer: >60 mL/min (ref >60)
GLUCOSE: 140 mg/dL — AB (ref 70–99)
Potassium: 3.7 mmol/L (ref 3.5–5.1)
SODIUM: 134 mmol/L — AB (ref 135–145)
Total Bilirubin: 0.6 mg/dL (ref 0.3–1.2)
Total Protein: 7.6 g/dL (ref 6.5–8.1)

## 2018-05-03 LAB — LACTIC ACID, PLASMA
LACTIC ACID, VENOUS: 1.3 mmol/L (ref 0.5–1.9)
Lactic Acid, Venous: 1.8 mmol/L (ref 0.5–1.9)

## 2018-05-03 LAB — CBC WITH DIFFERENTIAL/PLATELET
BASOS ABS: 0 10*3/uL (ref 0–0.1)
Basophils Relative: 0 %
Eosinophils Absolute: 0 10*3/uL (ref 0–0.7)
Eosinophils Relative: 0 %
HEMATOCRIT: 38.2 % — AB (ref 40.0–52.0)
Hemoglobin: 13 g/dL (ref 13.0–18.0)
LYMPHS ABS: 0.7 10*3/uL — AB (ref 1.0–3.6)
LYMPHS PCT: 4 %
MCH: 31.4 pg (ref 26.0–34.0)
MCHC: 34.1 g/dL (ref 32.0–36.0)
MCV: 92 fL (ref 80.0–100.0)
Monocytes Absolute: 1 10*3/uL (ref 0.2–1.0)
Monocytes Relative: 6 %
NEUTROS ABS: 15.8 10*3/uL — AB (ref 1.4–6.5)
Neutrophils Relative %: 90 %
Platelets: 174 10*3/uL (ref 150–440)
RBC: 4.15 MIL/uL — ABNORMAL LOW (ref 4.40–5.90)
RDW: 14.2 % (ref 11.5–14.5)
WBC: 17.5 10*3/uL — ABNORMAL HIGH (ref 3.8–10.6)

## 2018-05-03 LAB — PROTIME-INR
INR: 1.04
Prothrombin Time: 13.5 seconds (ref 11.4–15.2)

## 2018-05-03 LAB — EXPECTORATED SPUTUM ASSESSMENT W REFEX TO RESP CULTURE

## 2018-05-03 LAB — MRSA PCR SCREENING: MRSA by PCR: NEGATIVE

## 2018-05-03 MED ORDER — MIDODRINE HCL 5 MG PO TABS
2.5000 mg | ORAL_TABLET | Freq: Three times a day (TID) | ORAL | Status: DC
  Administered 2018-05-04: 2.5 mg via ORAL
  Filled 2018-05-03 (×3): qty 0.5

## 2018-05-03 MED ORDER — SODIUM CHLORIDE 0.9 % IV BOLUS (SEPSIS)
250.0000 mL | Freq: Once | INTRAVENOUS | Status: AC
  Administered 2018-05-03: 250 mL via INTRAVENOUS

## 2018-05-03 MED ORDER — ENOXAPARIN SODIUM 40 MG/0.4ML ~~LOC~~ SOLN: 40.0000 mg | Freq: Every day | SUBCUTANEOUS | Status: DC

## 2018-05-03 MED ORDER — ONDANSETRON HCL 4 MG/2ML IJ SOLN: 4.0000 mg | Freq: Four times a day (QID) | INTRAMUSCULAR | Status: DC | PRN

## 2018-05-03 MED ORDER — SODIUM CHLORIDE 0.9 % IV SOLN
INTRAVENOUS | Status: DC
  Administered 2018-05-03 – 2018-05-04 (×3): via INTRAVENOUS

## 2018-05-03 MED ORDER — SODIUM CHLORIDE 0.9 % IV BOLUS (SEPSIS)
1000.0000 mL | Freq: Once | INTRAVENOUS | Status: AC
  Administered 2018-05-03: 1000 mL via INTRAVENOUS

## 2018-05-03 MED ORDER — SODIUM CHLORIDE 0.9 % IV BOLUS (SEPSIS)
500.0000 mL | Freq: Once | INTRAVENOUS | Status: AC
  Administered 2018-05-03: 500 mL via INTRAVENOUS

## 2018-05-03 MED ORDER — OXYCODONE HCL 5 MG PO TABS
20.0000 mg | ORAL_TABLET | Freq: Four times a day (QID) | ORAL | Status: DC
  Administered 2018-05-03: 20 mg via ORAL
  Filled 2018-05-03: qty 4

## 2018-05-03 MED ORDER — ACETAMINOPHEN 650 MG RE SUPP: 650.0000 mg | Freq: Four times a day (QID) | RECTAL | Status: DC | PRN

## 2018-05-03 MED ORDER — DULOXETINE HCL 30 MG PO CPEP: 60.0000 mg | ORAL_CAPSULE | Freq: Every day | ORAL | Status: DC

## 2018-05-03 MED ORDER — SODIUM CHLORIDE 0.9 % IV SOLN
2.0000 g | Freq: Three times a day (TID) | INTRAVENOUS | Status: DC
  Administered 2018-05-03 – 2018-05-04 (×2): 2 g via INTRAVENOUS
  Filled 2018-05-03 (×4): qty 2

## 2018-05-03 MED ORDER — PANTOPRAZOLE SODIUM 40 MG PO TBEC: 40.0000 mg | DELAYED_RELEASE_TABLET | Freq: Every day | ORAL | Status: DC

## 2018-05-03 MED ORDER — SODIUM CHLORIDE 0.9 % IV BOLUS
1000.0000 mL | Freq: Once | INTRAVENOUS | Status: AC
  Administered 2018-05-03: 1000 mL via INTRAVENOUS

## 2018-05-03 MED ORDER — DULOXETINE HCL 60 MG PO CPEP
60.0000 mg | ORAL_CAPSULE | Freq: Every day | ORAL | Status: DC
  Filled 2018-05-03: qty 1

## 2018-05-03 MED ORDER — CLONAZEPAM 1 MG PO TABS
1.0000 mg | ORAL_TABLET | Freq: Two times a day (BID) | ORAL | Status: DC
  Administered 2018-05-04 (×2): 1 mg via ORAL
  Filled 2018-05-03 (×2): qty 1

## 2018-05-03 MED ORDER — ARIPIPRAZOLE 2 MG PO TABS
2.0000 mg | ORAL_TABLET | Freq: Every day | ORAL | Status: DC
  Filled 2018-05-03: qty 1

## 2018-05-03 MED ORDER — MIDODRINE HCL 5 MG PO TABS
2.5000 mg | ORAL_TABLET | ORAL | Status: AC
  Administered 2018-05-03: 2.5 mg via ORAL
  Filled 2018-05-03: qty 0.5

## 2018-05-03 MED ORDER — VANCOMYCIN HCL IN DEXTROSE 750-5 MG/150ML-% IV SOLN
750.0000 mg | Freq: Two times a day (BID) | INTRAVENOUS | Status: DC
  Administered 2018-05-03: 750 mg via INTRAVENOUS
  Filled 2018-05-03 (×3): qty 150

## 2018-05-03 MED ORDER — IPRATROPIUM-ALBUTEROL 0.5-2.5 (3) MG/3ML IN SOLN
3.0000 mL | Freq: Four times a day (QID) | RESPIRATORY_TRACT | Status: DC
  Administered 2018-05-03 – 2018-05-04 (×4): 3 mL via RESPIRATORY_TRACT
  Filled 2018-05-03 (×4): qty 3

## 2018-05-03 MED ORDER — VENLAFAXINE HCL 37.5 MG PO TABS
37.5000 mg | ORAL_TABLET | Freq: Two times a day (BID) | ORAL | Status: DC
  Administered 2018-05-03 – 2018-05-05 (×4): 37.5 mg via ORAL
  Filled 2018-05-03 (×5): qty 1

## 2018-05-03 MED ORDER — ARIPIPRAZOLE 2 MG PO TABS
2.0000 mg | ORAL_TABLET | Freq: Every day | ORAL | Status: DC
  Administered 2018-05-03: 2 mg via ORAL
  Filled 2018-05-03 (×3): qty 1

## 2018-05-03 MED ORDER — OXYCODONE HCL 5 MG PO TABS
20.0000 mg | ORAL_TABLET | Freq: Four times a day (QID) | ORAL | Status: DC
  Administered 2018-05-03 – 2018-05-05 (×7): 20 mg via ORAL
  Filled 2018-05-03 (×7): qty 4

## 2018-05-03 MED ORDER — ENOXAPARIN SODIUM 40 MG/0.4ML ~~LOC~~ SOLN: 40.0000 mg | SUBCUTANEOUS | Status: DC

## 2018-05-03 MED ORDER — ENOXAPARIN SODIUM 30 MG/0.3ML ~~LOC~~ SOLN
30.0000 mg | Freq: Every day | SUBCUTANEOUS | Status: DC
  Administered 2018-05-03 – 2018-05-04 (×2): 30 mg via SUBCUTANEOUS
  Filled 2018-05-03 (×2): qty 0.3

## 2018-05-03 MED ORDER — TRAZODONE HCL 50 MG PO TABS: 150.0000 mg | ORAL_TABLET | Freq: Every day | ORAL | Status: DC

## 2018-05-03 MED ORDER — SODIUM CHLORIDE 0.9 % IV SOLN
2.0000 g | Freq: Once | INTRAVENOUS | Status: AC
  Administered 2018-05-03: 2 g via INTRAVENOUS
  Filled 2018-05-03: qty 2

## 2018-05-03 MED ORDER — CLONAZEPAM 0.5 MG PO TABS
1.0000 mg | ORAL_TABLET | Freq: Two times a day (BID) | ORAL | Status: DC
  Administered 2018-05-03: 1 mg via ORAL
  Filled 2018-05-03: qty 2

## 2018-05-03 MED ORDER — VANCOMYCIN HCL IN DEXTROSE 1-5 GM/200ML-% IV SOLN
1000.0000 mg | Freq: Once | INTRAVENOUS | Status: AC
  Administered 2018-05-03: 1000 mg via INTRAVENOUS
  Filled 2018-05-03: qty 200

## 2018-05-03 MED ORDER — PREGABALIN 75 MG PO CAPS
150.0000 mg | ORAL_CAPSULE | Freq: Two times a day (BID) | ORAL | Status: DC
  Administered 2018-05-03: 150 mg via ORAL
  Filled 2018-05-03: qty 2

## 2018-05-03 MED ORDER — ENOXAPARIN SODIUM 30 MG/0.3ML ~~LOC~~ SOLN: 30.0000 mg | Freq: Every day | SUBCUTANEOUS | Status: DC

## 2018-05-03 MED ORDER — ONDANSETRON HCL 4 MG PO TABS: 4.0000 mg | ORAL_TABLET | Freq: Four times a day (QID) | ORAL | Status: DC | PRN

## 2018-05-03 MED ORDER — ACETAMINOPHEN 325 MG PO TABS: 650.0000 mg | ORAL_TABLET | Freq: Four times a day (QID) | ORAL | Status: DC | PRN

## 2018-05-03 MED ORDER — FLUTICASONE PROPIONATE 50 MCG/ACT NA SUSP
2.0000 | Freq: Every day | NASAL | Status: DC | PRN
  Filled 2018-05-03: qty 16

## 2018-05-03 MED ORDER — PANTOPRAZOLE SODIUM 40 MG PO TBEC
40.0000 mg | DELAYED_RELEASE_TABLET | Freq: Every day | ORAL | Status: DC
  Administered 2018-05-03: 40 mg via ORAL
  Filled 2018-05-03: qty 1

## 2018-05-03 MED ORDER — TRAZODONE HCL 50 MG PO TABS
150.0000 mg | ORAL_TABLET | Freq: Every day | ORAL | Status: DC
  Administered 2018-05-03 – 2018-05-04 (×2): 150 mg via ORAL
  Filled 2018-05-03 (×3): qty 1

## 2018-05-03 MED ORDER — PREGABALIN 75 MG PO CAPS
150.0000 mg | ORAL_CAPSULE | Freq: Two times a day (BID) | ORAL | Status: DC
  Administered 2018-05-04 (×2): 150 mg via ORAL
  Filled 2018-05-03 (×2): qty 2

## 2018-05-03 NOTE — H&P (Addendum)
[facility] [place] NAME: Mark [name]    MR#:  [id number]  DATE OF BIRTH:  1956-04-30  DATE OF ADMISSION:  05/03/2018  PRIMARY CARE PHYSICIAN: Secundino Ginger, PA-C   REQUESTING/REFERRING PHYSICIAN: Dr Lavonia Drafts  CHIEF COMPLAINT:   Chief Complaint  Patient presents with  . Shortness of Breath  . Altered Mental Status    HISTORY OF PRESENT ILLNESS:  Mark [name]  is a 62 y.o. male with history of prior throat cancer.  He states that every time he gets his esophagus stretched he gets pneumonia.  Yesterday he felt tired and his right side started hurting him.  He started feeling weak.  Right side pain 10 out of 10 in intensity.  He has been taking his oxycodone which seems to help.  He does have chronic dysphasia and has thickened liquid diet.  In the ER, he was found to be hypotensive, tachycardic and elevated white count.  Hospitalist services were contacted for sepsis.  The patient states that he normally has low blood pressure.  PAST MEDICAL HISTORY:   Past Medical History:  Diagnosis Date  . Arthritis    left hand  . Aspiration pneumonia ([place])   . Cancer ([place])    tonsilal ca  . COPD (chronic obstructive pulmonary disease) ([place])   . Incontinence    night time, began recently  . Memory deficit 11/18/2014  . PEG tube 11/18/2014   removed summer 2017  . Peripheral neuropathy 11/18/2014  . Smokers' cough ([place])   . Tonsillar cancer, S/P surgery, chemotherapy XRT 2009-followed at [facility] 11/18/2014  . Wears dentures    full upper and lower    PAST SURGICAL HISTORY:   Past Surgical History:  Procedure Laterality Date  . APPENDECTOMY    . CHOLECYSTECTOMY  2007  . ESOPHAGEAL DILATION N/A 08/01/2016   Procedure: ESOPHAGEAL DILATION;  Surgeon: [name], MD;  Location: [place];  Service: Endoscopy;  Laterality: N/A;  . ESOPHAGEAL DILATION N/A 02/19/2017   Procedure: ESOPHAGEAL DILATION;  Surgeon: [name], MD;  Location: [place];  Service: Endoscopy;  Laterality: N/A;  . ESOPHAGEAL DILATION N/A 08/27/2017   Procedure: ESOPHAGEAL DILATION;  Surgeon: [name], MD;  Location: [place];  Service: Endoscopy;  Laterality: N/A;  . ESOPHAGOGASTRODUODENOSCOPY (EGD) WITH PROPOFOL N/A 01/24/2016   Procedure: ESOPHAGOGASTRODUODENOSCOPY (EGD) WITH gastric biopsy and esophageal dilation.;  Surgeon: [name], MD;  Location: [place];  Service: Endoscopy;  Laterality: N/A;  . ESOPHAGOGASTRODUODENOSCOPY (EGD) WITH PROPOFOL N/A 08/01/2016   Procedure: ESOPHAGOGASTRODUODENOSCOPY (EGD) WITH PROPOFOL;  Surgeon: [name], MD;  Location: [place];  Service: Endoscopy;  Laterality: N/A;  . ESOPHAGOGASTRODUODENOSCOPY (EGD) WITH PROPOFOL N/A 02/19/2017   Procedure: ESOPHAGOGASTRODUODENOSCOPY (EGD) WITH PROPOFOL;  Surgeon: [name], MD;  Location: [place];  Service: Endoscopy;  Laterality: N/A;  . ESOPHAGOGASTRODUODENOSCOPY (EGD) WITH PROPOFOL N/A 08/27/2017   Procedure: ESOPHAGOGASTRODUODENOSCOPY (EGD) WITH PROPOFOL;  Surgeon: [name], MD;  Location: [place];  Service: Endoscopy;  Laterality: N/A;  . ESOPHAGOGASTRODUODENOSCOPY (EGD) WITH PROPOFOL N/A 03/26/2018   Procedure: ESOPHAGOGASTRODUODENOSCOPY (EGD) WITH PROPOFOL;  Surgeon: Jonathon Bellows, MD;  Location: [facility] ENDOSCOPY;  Service: Gastroenterology;  Laterality: N/A;  . ESOPHAGOGASTRODUODENOSCOPY (EGD) WITH PROPOFOL N/A 04/22/2018   Procedure: ESOPHAGOGASTRODUODENOSCOPY (EGD) WITH PROPOFOL;  Surgeon: [name], MD;  Location: [place];  Service: Endoscopy;  Laterality: N/A;  . ESOPHAGOSCOPY WITH DILITATION N/A 04/22/2018   Procedure: ESOPHAGOSCOPY WITH DILITATION;  Surgeon:  [name], MD;  Location: [place];  Service: Endoscopy;  Laterality: N/A;  . PEG PLACEMENT N/A 11/28/2015   Procedure: PERCUTANEOUS ENDOSCOPIC GASTROSTOMY (PEG) PLACEMENT;  Surgeon: [name], MD;  Location: [place];  Service: Endoscopy;  Laterality: N/A;  . PEG TUBE PLACEMENT  10/22/12   10/22/12 - [facility], 10/23/14 - [facility], Dr. Allen Norris, replaced  . TONSILLECTOMY     jan 2010--stage IV    SOCIAL HISTORY:   Social History   Tobacco Use  . Smoking status: Current Every Day Smoker    Years: 40.00  . Smokeless tobacco: Never Used  . Tobacco comment: was 2 PPD, now 2 cigs/day  Substance Use Topics  . Alcohol use: No    FAMILY HISTORY:   Family History  Problem Relation Age of Onset  . Pancreatic cancer Mother   . Stroke Father   . Testicular cancer Father   . Suicidality Father     DRUG ALLERGIES:   Allergies  Allergen Reactions  . Morphine And Related Nausea And Vomiting    REVIEW OF SYSTEMS:  CONSTITUTIONAL: No fever.positive for chills.  Positive for fatigue.  EYES: No blurred or double vision.  EARS, NOSE, AND THROAT: No tinnitus or ear pain. No sore throat.  Positive for dysphasia. RESPIRATORY: Positive for cough with whitish phlegm.  No shortness of breath, wheezing or hemoptysis.  CARDIOVASCULAR: Positive for right pleuritic chest pain.  No orthopnea, edema.  GASTROINTESTINAL: Positive for nausea, vomiting, no diarrhea or abdominal pain. No blood in bowel movements GENITOURINARY: No dysuria, hematuria.  ENDOCRINE: No polyuria, nocturia,  HEMATOLOGY: No anemia, easy bruising or bleeding SKIN: No rash or lesion. MUSCULOSKELETAL: Positive for joint pain in the legs NEUROLOGIC: No tingling, numbness, weakness.  PSYCHIATRY: History of anxiety and depression.   MEDICATIONS AT HOME:   Prior to Admission medications   Medication Sig Start Date End Date Taking? Authorizing Provider  ARIPiprazole (ABILIFY) 2 MG tablet Take 2 mg by mouth daily.    Yes [provider]  clonazePAM (KLONOPIN) 1 MG tablet Take 1 mg by mouth 2 (two) times daily.    Yes [provider]  DULoxetine (CYMBALTA) 60 MG capsule Take 60 mg by mouth daily.   Yes  [provider]  Oxycodone HCl 20 MG TABS Take 20 mg by mouth 4 (four) times daily.    Yes [provider]  pantoprazole (PROTONIX) 40 MG tablet Take 40 mg by mouth at bedtime.    Yes [provider]  pregabalin (LYRICA) 150 MG capsule Take 1 capsule by mouth 2 (two) times daily. 04/13/18  Yes [provider]  traZODone (DESYREL) 150 MG tablet Take 150 mg by mouth at bedtime.    Yes [provider]  venlafaxine (EFFEXOR) 37.5 MG tablet Take 37.5 mg by mouth 2 (two) times daily with a meal.    Yes [provider]  albuterol (PROVENTIL HFA;VENTOLIN HFA) 108 (90 BASE) MCG/ACT inhaler Inhale 2 puffs into the lungs every 6 (six) hours as needed for wheezing or shortness of breath.     [provider]  Ipratropium-Albuterol (COMBIVENT) 20-100 MCG/ACT AERS respimat Inhale 2 puffs into the lungs 2 (two) times daily.    [provider]  mometasone (NASONEX) 50 MCG/ACT nasal spray Place 2 sprays into both nostrils 2 (two) times daily.    [provider]      VITAL SIGNS:  Blood pressure (!) 89/62, pulse 90, temperature 98.9 F (37.2 C), temperature source Oral, resp. rate  18, height 6' (1.829 m), weight 54.9 kg, SpO2 91 %.  PHYSICAL EXAMINATION:  GENERAL:  62 y.o.-year-old patient lying in the bed with no acute distress.  EYES: Pupils equal, round, reactive to light and accommodation. No scleral icterus. Extraocular muscles intact.  HEENT: Head atraumatic, normocephalic. Oropharynx and nasopharynx clear.  NECK:  Supple, no jugular venous distention. No thyroid enlargement, no tenderness.  LUNGS: Decreased breath sounds right base.  Positive rhonchi right base.  No use of accessory muscles of respiration.  CARDIOVASCULAR: S1, S2 normal. No murmurs, rubs, or gallops.  ABDOMEN: Soft, nontender, nondistended. Bowel sounds present. No organomegaly or mass.  EXTREMITIES: No pedal edema, cyanosis, or clubbing.  NEUROLOGIC:  Cranial nerves II through XII are intact. Muscle strength 5/5 in all extremities. Sensation intact. Gait not checked.  PSYCHIATRIC: The patient is alert and oriented x 3.  SKIN: No rash, lesion, or ulcer.   LABORATORY PANEL:   CBC Recent Labs  Lab 05/03/18 1258  WBC 17.5*  HGB 13.0  HCT 38.2*  PLT 174   ------------------------------------------------------------------------------------------------------------------  Chemistries  Recent Labs  Lab 05/03/18 1258  NA 134*  K 3.7  CL 95*  CO2 30  GLUCOSE 140*  BUN 24*  CREATININE 0.89  CALCIUM 8.7*  AST 26  ALT 15  ALKPHOS 66  BILITOT 0.6   ------------------------------------------------------------------------------------------------------------------    RADIOLOGY:  Dg Chest 2 View  Result Date: 05/03/2018 CLINICAL DATA:  Shortness of breath and confusion EXAM: CHEST - 2 VIEW COMPARISON:  September 09, 2017 FINDINGS: There is diffuse interstitial thickening bilaterally. There are areas of scarring throughout the right lung, most notably in the right upper lobe. There is chronic blunting of each costophrenic angle, likely due to chronic scarring. There is no frank edema or consolidation. Heart size and pulmonary vascularity are normal. No adenopathy. No evident bone lesions. IMPRESSION: Diffuse interstitial thickening bilaterally. Scarring throughout right lung, most notably in the right upper lobe, stable. No edema or consolidation is evident. Stable cardiac silhouette. No adenopathy evident. Comment: Nipple shadows noted bilaterally. Electronically Signed   By: Lowella Grip III M.D.   On: 05/03/2018 13:25    EKG:   Sinus tachycardia 104 bpm flipped T waves laterally  IMPRESSION AND PLAN:   1.  Clinical sepsis, tachycardia, leukocytosis and suspected aspiration pneumonia.  Patient given vancomycin and cefepime in the ER.  I will continue cefepime.  Follow-up blood cultures.  Obtain urine analysis.  Obtain sputum  culture.  Send off MRSA PCR. 2.  Chronic hypotension.  The patient chronically has hypotension.  Give gentle IV fluid hydration. 3.  Chronic pain on oxycodone 4.  History of tonsillar cancer 5.  History of esophageal stricture status post dilation.  Continue Protonix 6.  Continue psychiatric medications 7.  Tobacco abuse.  Patient only smokes 3 cigarettes a day.  No need for nicotine patch.  Smoking cessation counseling done 4 minutes by me.    All the records are reviewed and case discussed with ED provider. Management plans discussed with the patient, family and they are in agreement.  CODE STATUS: full code  TOTAL TIME TAKING CARE OF THIS PATIENT: 61minutes, including acp time.    Loletha Grayer M.D on 05/03/2018 at 2:58 PM  Between 7am to 6pm - Pager - [phone]  After 6pm call admission pager [phone]  [facility] Office  [phone]  CC: Primary care physician; Secundino Ginger, PA-C

## 2018-05-03 NOTE — Progress Notes (Signed)
CODE SEPSIS - PHARMACY COMMUNICATION  **Broad Spectrum Antibiotics should be administered within 1 hour of Sepsis diagnosis**  Time Code Sepsis Called/Page Received: 1325  Antibiotics Ordered: vancomycin, cefepime   Time of 1st antibiotic administration: 3795  Additional action taken by pharmacy: Spoke with RN who stated that cefepime had been pulled from pyxis and she would follow up if abx had been given and if not why  If necessary, Name of Provider/Nurse Contacted:     Ramond Dial ,PharmD Clinical Pharmacist  05/03/2018  1:38 PM

## 2018-05-03 NOTE — Consult Note (Signed)
Pharmacy Antibiotic Note  Mark [name] is a 62 y.o. male admitted on 05/03/2018 with pneumonia and sepsis.  Pharmacy has been consulted for vancomycin/cefepime dosing.  Plan: Cefepime 2g q 8 hours Vancomycin 1g was given in the ED. Will give next dose in 8 hours for stacked dosing. Vancomycin 750mg  IV every 12 hours.  Goal trough 15-20 mcg/mL.  Trough prior to the 5th dose  Height: 6' (182.9 cm) Weight: 121 lb (54.9 kg) IBW/kg (Calculated) : 77.6  Temp (24hrs), Avg:98.7 F (37.1 C), Min:98.5 F (36.9 C), Max:98.9 F (37.2 C)  Recent Labs  Lab 05/03/18 1258  WBC 17.5*  CREATININE 0.89  LATICACIDVEN 1.8    Estimated Creatinine Clearance: 66.8 mL/min (by C-G formula based on SCr of 0.89 mg/dL).    Allergies  Allergen Reactions  . Morphine And Related Nausea And Vomiting    Antimicrobials this admission: vancoymcin 9/16 >>  cefepime 9/16 >>   Dose adjustments this admission:   Microbiology results: 9/16 BCx:  9/16 MRSA PCR: needs to be collected  Thank you for allowing pharmacy to be a part of this patient's care.  Ramond Dial, Pharm.D, BCPS Clinical Pharmacist 05/03/2018 4:18 PM

## 2018-05-03 NOTE — Progress Notes (Signed)
lovenox changed to 30mg  daily due to a TBW less than 57kg for males per protocol  Ramond Dial, Pharm.D, BCPS Clinical Pharmacist

## 2018-05-03 NOTE — Progress Notes (Signed)
Called Dr. Leslye Peer regarding patient's low blood pressure- 69/50 dinamap, 70/56 manually.  Doctor ordered bolus of NS and midodrine for am.  Christene Slates  05/03/2018  11:09 PM

## 2018-05-03 NOTE — ED Provider Notes (Signed)
[facility] Emergency Department Provider Note   ____________________________________________    I have reviewed the triage vital signs and the nursing notes.   HISTORY  Chief Complaint Shortness of breath, weakness    HPI Mark [name] is a 62 y.o. male who presents with shortness of breath, mild cough.  He has discomfort on the right side of his chest.  He states that he recently had esophageal dilation and notes that the last time he had his esophagus dilated he also developed a pneumonia shortly thereafter.  He thinks he may have had a fever last night but did not check.  Does describe some chills.  Feels diffusely weak.  Has not taken anything for this.  Past Medical History:  Diagnosis Date  . Arthritis    left hand  . Aspiration pneumonia ([place])   . Cancer ([place])    tonsilal ca  . COPD (chronic obstructive pulmonary disease) ([place])   . Incontinence    night time, began recently  . Memory deficit 11/18/2014  . PEG tube 11/18/2014   removed summer 2017  . Peripheral neuropathy 11/18/2014  . Smokers' cough ([place])   . Tonsillar cancer, S/P surgery, chemotherapy XRT 2009-followed at [facility] 11/18/2014  . Wears dentures    full upper and lower    Patient Active Problem List   Diagnosis Date Noted  . Dysphagia   . Palliative care by specialist   . Protein-calorie malnutrition, severe 09/10/2017  . Esophageal candidiasis ([place])   . Esophageal dysphagia   . Palliative care encounter   . Severe sepsis with septic shock ([place]) 04/06/2017  . Multifocal pneumonia 04/06/2017  . Lactic acidosis 04/06/2017  . Acute respiratory failure with hypoxia ([place]) 04/06/2017  . Septic shock ([place]) 04/06/2017  . Obstruction of esophagus   . 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 Bronchiectasis ([place]) 03/26/2016  . Stricture and stenosis of esophagus   . Gastrostomy status ([place])   . COPD (chronic obstructive pulmonary disease) ([place])  12/19/2015  . Tobacco dependency 12/19/2015  . 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 Problems with swallowing and mastication   . Mechanical complication of gastrostomy ([place])   . Gastritis   . Excessive urination at night 05/30/2015  . Goals of care, counseling/discussion 05/30/2015  . FOM (frequency of micturition) 05/30/2015  . Sepsis ([place]) 04/23/2015  . Aspiration pneumonia ([place]) 11/18/2014  . Tonsillar cancer, S/P surgery, chemotherapy XRT 2009-followed at [facility] 11/18/2014  . Peripheral neuropathy 11/18/2014  . Adult failure to thrive 11/18/2014  . Recurrent aspiration pneumonia ([place]) 11/18/2014  . Memory deficit 11/18/2014  . PEG tube  11/18/2014    Past Surgical History:  Procedure Laterality Date  . APPENDECTOMY    . CHOLECYSTECTOMY  2007  . ESOPHAGEAL DILATION N/A 08/01/2016   Procedure: ESOPHAGEAL DILATION;  Surgeon: [name], MD;  Location: [place];  Service: Endoscopy;  Laterality: N/A;  . ESOPHAGEAL DILATION N/A 02/19/2017   Procedure: ESOPHAGEAL DILATION;  Surgeon: [name], MD;  Location: [place];  Service: Endoscopy;  Laterality: N/A;  . ESOPHAGEAL DILATION N/A 08/27/2017   Procedure: ESOPHAGEAL DILATION;  Surgeon: [name], MD;  Location: [place];  Service: Endoscopy;  Laterality: N/A;  . ESOPHAGOGASTRODUODENOSCOPY (EGD) WITH PROPOFOL N/A 01/24/2016   Procedure: ESOPHAGOGASTRODUODENOSCOPY (EGD) WITH gastric biopsy and esophageal dilation.;  Surgeon: [name], MD;  Location: [place];  Service: Endoscopy;  Laterality: N/A;  . ESOPHAGOGASTRODUODENOSCOPY (EGD) WITH PROPOFOL N/A 08/01/2016   Procedure:  ESOPHAGOGASTRODUODENOSCOPY (EGD) WITH PROPOFOL;  Surgeon: [name], MD;  Location: [place];  Service: Endoscopy;  Laterality: N/A;  . ESOPHAGOGASTRODUODENOSCOPY (EGD) WITH PROPOFOL N/A 02/19/2017   Procedure: ESOPHAGOGASTRODUODENOSCOPY (EGD) WITH PROPOFOL;  Surgeon: [name], MD;  Location: [place];  Service: Endoscopy;  Laterality: N/A;  . ESOPHAGOGASTRODUODENOSCOPY (EGD) WITH PROPOFOL N/A 08/27/2017   Procedure: ESOPHAGOGASTRODUODENOSCOPY (EGD) WITH PROPOFOL;  Surgeon: [name], MD;  Location: [place];  Service: Endoscopy;  Laterality: N/A;  . ESOPHAGOGASTRODUODENOSCOPY (EGD) WITH PROPOFOL N/A 03/26/2018   Procedure: ESOPHAGOGASTRODUODENOSCOPY (EGD) WITH PROPOFOL;  Surgeon: Jonathon Bellows, MD;  Location: [facility] ENDOSCOPY;  Service: Gastroenterology;  Laterality: N/A;  . ESOPHAGOGASTRODUODENOSCOPY (EGD) WITH PROPOFOL N/A 04/22/2018   Procedure: ESOPHAGOGASTRODUODENOSCOPY (EGD) WITH PROPOFOL;  Surgeon: [name], MD;  Location: [place];  Service: Endoscopy;  Laterality: N/A;  . ESOPHAGOSCOPY WITH DILITATION N/A 04/22/2018   Procedure: ESOPHAGOSCOPY WITH DILITATION;  Surgeon: [name], MD;  Location: [place];  Service: Endoscopy;  Laterality: N/A;  . PEG PLACEMENT N/A 11/28/2015   Procedure: PERCUTANEOUS ENDOSCOPIC GASTROSTOMY (PEG) PLACEMENT;  Surgeon: [name], MD;  Location: [place];  Service: Endoscopy;  Laterality: N/A;  . PEG TUBE PLACEMENT  10/22/12   10/22/12 - [facility], 10/23/14 - [facility], Dr. Allen Norris, replaced  . 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 Authorizing Provider  ARIPiprazole (ABILIFY) 2 MG tablet Take 2 mg by mouth daily.    Yes [provider]  clonazePAM (KLONOPIN) 1 MG tablet Take 1 mg by mouth 2 (two) times daily.    Yes [provider]  DULoxetine (CYMBALTA) 60 MG capsule Take 60 mg by mouth daily.   Yes [provider]  Oxycodone HCl 20 MG TABS Take 20 mg by mouth 4 (four) times daily.    Yes [provider]  pantoprazole (PROTONIX) 40 MG tablet Take 40 mg by mouth at bedtime.    Yes [provider]  pregabalin (LYRICA) 150 MG capsule Take 1 capsule by mouth 2 (two) times daily. 04/13/18  Yes  [provider]  traZODone (DESYREL) 150 MG tablet Take 150 mg by mouth at bedtime.    Yes [provider]  venlafaxine (EFFEXOR) 37.5 MG tablet Take 37.5 mg by mouth 2 (two) times daily with a meal.    Yes [provider]  albuterol (PROVENTIL HFA;VENTOLIN HFA) 108 (90 BASE) MCG/ACT inhaler Inhale 2 puffs into the lungs every 6 (six) hours as needed for wheezing or shortness of breath.     [provider]  Ipratropium-Albuterol (COMBIVENT) 20-100 MCG/ACT AERS respimat Inhale 2 puffs into the lungs 2 (two) times daily.    [provider]  mometasone (NASONEX) 50 MCG/ACT nasal spray Place 2 sprays into both nostrils 2 (two) times daily.    [provider]     Allergies Morphine and related  Family History  Problem Relation Age of Onset  . Liver cancer Mother   . Stroke Father   . Testicular cancer Father     Social History Social History   Tobacco Use  . Smoking status: Current Every Day Smoker    Years: 40.00  . Smokeless tobacco: Never Used  . Tobacco comment: was 2 PPD, now 2 cigs/day  Substance Use Topics  . Alcohol use: No  . Drug use: No    Review of Systems  Constitutional: As above Eyes: No visual changes.  ENT: No sore throat. Cardiovascular: As above Respiratory: As above Gastrointestinal: No abdominal pain.   Genitourinary: Negative for dysuria. Musculoskeletal: Negative for back pain. Skin: Negative for rash. Neurological: Negative for headaches or weakness   ____________________________________________   PHYSICAL EXAM:  VITAL SIGNS: ED Triage Vitals  Enc Vitals Group     BP 05/03/18 1242 (!) 73/43     Pulse Rate 05/03/18 1242 (!) 107     Resp 05/03/18 1242 18     Temp 05/03/18 1242 98.9 F (37.2 C)     Temp Source 05/03/18 1242 Oral     SpO2 05/03/18 1242 90 %     Weight 05/03/18 1243 54.9 kg (121 lb)     Height 05/03/18 1243 1.829 m (6')     Head Circumference --      Peak Flow --       Pain Score 05/03/18 1242 10     Pain Loc --      Pain Edu? --      Excl. in [place]? --     Constitutional: Alert and oriented. No acute distress. Pleasant and interactive  Nose: No congestion/rhinnorhea. Mouth/Throat: Mucous membranes are moist.    Cardiovascular: Normal rate, regular rhythm. Grossly normal heart sounds.  Good peripheral circulation. Respiratory: Normal respiratory effort.  No retractions.  Significant rales right lower lung Gastrointestinal: Soft and nontender. No distention.   Genitourinary: deferred Musculoskeletal: No lower extremity tenderness nor edema.  Warm and well perfused Neurologic:  Normal speech and language. No gross focal neurologic deficits are appreciated.  Skin:  Skin is warm, dry and intact. No rash noted. Psychiatric: Mood and affect are normal. Speech and behavior are normal.  ____________________________________________   LABS (all labs ordered are listed, but only abnormal results are displayed)  Labs Reviewed  COMPREHENSIVE METABOLIC PANEL - Abnormal; Notable for the following components:      Result Value   Sodium 134 (*)    Chloride 95 (*)    Glucose, Bld 140 (*)    BUN 24 (*)    Calcium 8.7 (*)    All other components within normal limits  CBC WITH DIFFERENTIAL/PLATELET - Abnormal; Notable for the following components:   WBC 17.5 (*)    RBC 4.15 (*)    HCT 38.2 (*)    Neutro Abs 15.8 (*)    Lymphs Abs 0.7 (*)    All other components within normal limits  CULTURE, BLOOD (ROUTINE X 2)  CULTURE, BLOOD (ROUTINE X 2)  LACTIC ACID, PLASMA  PROTIME-INR  LACTIC ACID, PLASMA  URINALYSIS, COMPLETE (UACMP) WITH MICROSCOPIC   ____________________________________________  EKG ED ECG REPORT I, Lavonia Drafts, the attending physician, personally viewed and interpreted this ECG.  Date: 05/03/2018  Rhythm: Sinus tachycardia QRS Axis: normal Intervals: normal ST/T Wave abnormalities: normal Narrative Interpretation: no evidence of  acute ischemia   ____________________________________________  RADIOLOGY  Chest x-ray with likely pneumonia ____________________________________________   PROCEDURES  Procedure(s) performed: No  Procedures   Critical Care performed: yes  CRITICAL CARE Performed by: Lavonia Drafts   Total critical care time: 30 minutes  Critical care time was exclusive of separately billable procedures and treating other patients.  Critical care was necessary to treat or prevent imminent or life-threatening deterioration.  Critical care was time spent personally by me on the following activities: development of treatment plan with patient and/or surrogate as well as nursing, discussions with consultants, evaluation of patient's response to treatment, examination of patient, obtaining history from patient or surrogate,  ordering and performing treatments and interventions, ordering and review of laboratory studies, ordering and review of radiographic studies, pulse oximetry and re-evaluation of patient's condition.  ____________________________________________   INITIAL IMPRESSION / ASSESSMENT AND PLAN / ED COURSE  Pertinent labs & imaging results that were available during my care of the patient were reviewed by me and considered in my medical decision making (see chart for details).  Patient presents with right-sided chest discomfort, cough and shortness of breath with tachycardia and hypotension after esophageal dilation.  He reports this feels similar to pneumonia he has had in the past after esophageal dilation.  Given hypotension, tachycardia, cough, elevated white blood cell count code sepsis called.  30 mils per kilogram of IV fluids started.  X-ray is suspicious for pneumonia although read as likely scarring by radiology, will treat as healthcare associated pneumonia.  Discussed with hospitalist for admission  Sepsis - Repeat Assessment  Performed at:    224 p  Vitals     Blood  pressure (!) 73/43, pulse (!) 107, temperature 98.9 F (37.2 C), temperature source Oral, resp. rate 18, height 1.829 m (6'), weight 54.9 kg, SpO2 90 %.  Heart:     Tachycardic  Lungs:    Rales  Capillary Refill:   <2 sec  Peripheral Pulse:   Radial pulse palpable  Skin:     Normal Color       ____________________________________________   FINAL CLINICAL IMPRESSION(S) / ED DIAGNOSES  Final diagnoses:  Severe sepsis ([place])        Note:  This document was prepared using Dragon voice recognition software and may include unintentional dictation errors.    Lavonia Drafts, MD 05/03/18 [phone]

## 2018-05-03 NOTE — ED Triage Notes (Addendum)
Patient reports waking up short of breath and pain in right side as well as  confusion. Patient reports history of aspiration pneumonia with similar symptoms. Patient reports that he had his throat stretched last week and in the past has developed pneumonia shortly after procedure. Patient back to baseline mentation upon arrival to ED. Patient reports intermittent fevers at home. Patient hypotensive in triage. BP taken on both arms with similar results.

## 2018-05-03 NOTE — ED Notes (Signed)
Pt is NAD pt is asymptomatic with a hypotensive BP. Pt has hx of COPD and wears 02 at night.

## 2018-05-03 NOTE — Progress Notes (Signed)
Patient ID: Mark [name], male   DOB: [birth year], 63 y.o.   MRN: [id number]  ACP note  Patient present  Diagnosis: Sepsis, aspiration pneumonia, Hypotension, chronic pain, history of tonsillar cancer, history of esophageal stricture  CODE STATUS discussed.  Patient wishes to be a full code.  Plan.  Aggressive antibiotics to cover aspiration pneumonia.  IV fluids.  Patient states he has chronically low blood pressure.  Time spent on ACP discussion 17 minutes Dr. Loletha Grayer

## 2018-05-03 NOTE — Progress Notes (Signed)
Called Dr. Duane Boston regarding patient's continued low blood pressure- 64/44, after bolus was given.  Appropriate orders were placed for another bolus, one time dose of midodrine, and increased maintenance fluids to 100 mL/hr.  Phoebe Sharps N  05/03/2018 11:12 PM

## 2018-05-03 NOTE — ED Notes (Signed)
.   Pt is resting, Respirations even and unlabored, NAD. Stretcher lowest postion and locked. Call bell within reach. Denies any needs at this time RN will continue to monitor.    

## 2018-05-04 LAB — BASIC METABOLIC PANEL
ANION GAP: 0 — AB (ref 5–15)
BUN: 22 mg/dL (ref 8–23)
CO2: 28 mmol/L (ref 22–32)
Calcium: 8 mg/dL — ABNORMAL LOW (ref 8.9–10.3)
Chloride: 109 mmol/L (ref 98–111)
Creatinine, Ser: 0.61 mg/dL (ref 0.61–1.24)
GFR calc Af Amer: >60 mL/min (ref >60)
Glucose, Bld: 96 mg/dL (ref 70–99)
POTASSIUM: 3.8 mmol/L (ref 3.5–5.1)
SODIUM: 137 mmol/L (ref 135–145)

## 2018-05-04 LAB — CBC
HEMATOCRIT: 30.8 % — AB (ref 40.0–52.0)
HEMOGLOBIN: 10.4 g/dL — AB (ref 13.0–18.0)
MCH: 31.4 pg (ref 26.0–34.0)
MCHC: 33.7 g/dL (ref 32.0–36.0)
MCV: 93.3 fL (ref 80.0–100.0)
Platelets: 131 10*3/uL — ABNORMAL LOW (ref 150–440)
RBC: 3.3 MIL/uL — AB (ref 4.40–5.90)
RDW: 14.8 % — ABNORMAL HIGH (ref 11.5–14.5)
WBC: 11.7 10*3/uL — AB (ref 3.8–10.6)

## 2018-05-04 LAB — PROCALCITONIN: PROCALCITONIN: 5.92 ng/mL

## 2018-05-04 LAB — HIV ANTIBODY (ROUTINE TESTING W REFLEX): HIV SCREEN 4TH GENERATION: NONREACTIVE

## 2018-05-04 MED ORDER — ORAL CARE MOUTH RINSE
15.0000 mL | Freq: Two times a day (BID) | OROMUCOSAL | Status: DC
  Administered 2018-05-04: 15 mL via OROMUCOSAL

## 2018-05-04 MED ORDER — ADULT MULTIVITAMIN W/MINERALS CH
1.0000 | ORAL_TABLET | Freq: Every day | ORAL | Status: DC
  Administered 2018-05-04 – 2018-05-05 (×2): 1 via ORAL
  Filled 2018-05-04 (×2): qty 1

## 2018-05-04 MED ORDER — MIDODRINE HCL 5 MG PO TABS
5.0000 mg | ORAL_TABLET | Freq: Three times a day (TID) | ORAL | Status: DC
  Administered 2018-05-04 – 2018-05-05 (×3): 5 mg via ORAL
  Filled 2018-05-04 (×5): qty 1

## 2018-05-04 MED ORDER — PIPERACILLIN-TAZOBACTAM 3.375 G IVPB
3.3750 g | Freq: Three times a day (TID) | INTRAVENOUS | Status: DC
  Administered 2018-05-04 – 2018-05-05 (×3): 3.375 g via INTRAVENOUS
  Filled 2018-05-04 (×3): qty 50

## 2018-05-04 MED ORDER — IPRATROPIUM-ALBUTEROL 0.5-2.5 (3) MG/3ML IN SOLN
3.0000 mL | Freq: Three times a day (TID) | RESPIRATORY_TRACT | Status: DC
  Administered 2018-05-04 – 2018-05-05 (×2): 3 mL via RESPIRATORY_TRACT
  Filled 2018-05-04 (×3): qty 3

## 2018-05-04 MED ORDER — ALBUTEROL SULFATE (2.5 MG/3ML) 0.083% IN NEBU: 2.5000 mg | INHALATION_SOLUTION | RESPIRATORY_TRACT | Status: DC | PRN

## 2018-05-04 MED ORDER — NEPRO/CARBSTEADY PO LIQD
237.0000 mL | Freq: Three times a day (TID) | ORAL | Status: DC
  Administered 2018-05-04 – 2018-05-05 (×2): 237 mL via ORAL

## 2018-05-04 MED ORDER — VITAMIN C 500 MG PO TABS
250.0000 mg | ORAL_TABLET | Freq: Two times a day (BID) | ORAL | Status: DC
  Administered 2018-05-04 – 2018-05-05 (×3): 250 mg via ORAL
  Filled 2018-05-04 (×4): qty 0.5

## 2018-05-04 NOTE — Consult Note (Signed)
Pharmacy Antibiotic Note  Mark [name] is a 62 y.o. male admitted on 05/03/2018 with pneumonia and sepsis.  Pharmacy was originally consulted for vancomycin/cefepime dosing and he was started on therapy last night. MRSA PCR returned negative this morning. He has chronic dysphagia, therefore there is likely an aspirational component to his airway disease. Antibiotic coverage is being changed to Zosyn.  Plan: Zosyn EI 3.375 grams IV every 8 hours  Height: 6' (182.9 cm) Weight: 107 lb 3.2 oz (48.6 kg) IBW/kg (Calculated) : 77.6  Temp (24hrs), Avg:98.1 F (36.7 C), Min:97.4 F (36.3 C), Max:98.9 F (37.2 C)  Recent Labs  Lab 05/03/18 1258 05/03/18 1604 05/04/18 0438  WBC 17.5*  --  11.7*  CREATININE 0.89  --  0.61  LATICACIDVEN 1.8 1.3  --     Estimated Creatinine Clearance: 65.8 mL/min (by C-G formula based on SCr of 0.61 mg/dL).    Allergies  Allergen Reactions  . Morphine And Related Nausea And Vomiting    Antimicrobials this admission: vancoymcin 9/16 >> 9/17 cefepime 9/16 >> 9/17 Zosyn 9/17>>  Microbiology results:  9/16 MRSA PCR: negative Sputum Cx: pending UCx 9/16: pending BCx 9/16: pending  Thank you for allowing pharmacy to be a part of this patient's care.  Vallery Sa, PharmD Clinical Pharmacist 05/04/2018 10:14 AM

## 2018-05-04 NOTE — Plan of Care (Signed)

## 2018-05-04 NOTE — Progress Notes (Signed)
Initial Nutrition Assessment  DOCUMENTATION CODES:   Severe malnutrition in context of chronic illness  INTERVENTION:   Nepro Shake po TID, each supplement provides 425 kcal and 19 grams protein  Magic cup TID with meals, each supplement provides 290 kcal and 9 grams of protein  MVI daily  Vitamin C 249m po BID  NUTRITION DIAGNOSIS:   Severe Malnutrition related to cancer and cancer related treatments, chronic illness(COPD) as evidenced by moderate fat depletion, severe fat depletion.  GOAL:   Patient will meet greater than or equal to 90% of their needs  MONITOR:   PO intake, Supplement acceptance, Labs, Weight trends, Skin, I & O's  REASON FOR ASSESSMENT:   Other (Comment)(low BMI)    ASSESSMENT:   62year old male with PMHx of tonsillar cancer s/p tonsillectomy and XRT in 2009, hx of aspiration PNA, hx PEG tube (10/22/2012-05/2016), gastrocutaneous fistula following PEG tube removal, peripheral neuropathy, memory deficit, COPD who presents with clinical sepsis, tachycardia, leukocytosis and suspected aspiration pneumonia.     Met with pt in room today. Pt reports good appetite and oral intake pta. Pt reports that he has been eating well but frequent small meals. Pt is unable to eat meats or breads as these get stuck in his throat. Pt has history of previous esophageal dilations. Pt does like supplements, but he reports he is unable to afford these at home. Per chart, pt has lost 21lbs(16%) over the past year. Pt with h/o G-tube which was removed in 05/2016 because pt felt as though he was eating well enough that he did not need it. Pt has progressively lost weight since having G-tube removed. Pt does not want to have another G-tube inserted. Pt is willing to drink as many supplements as he needs here in the hospital. Pt does not have a flavor preference as he reports he lost his taste after chemotherapy. RD provided pt's with Ensure coupons today. Pt uses simply thick to  thicken his liquids at home. RD will order supplements and vitamins to help pt meet his estimated needs. Will provide Nepro as this is nectar thick.   Medications reviewed and include: lovenox, MVI, oxycodone, protonix, NaCl '@100ml' /hr, zosyn  Labs reviewed: wbc 11.7(H), Hgb 10.4(L), Hct 30.8(L)  NUTRITION - FOCUSED PHYSICAL EXAM:    Most Recent Value  Orbital Region  Severe depletion  Upper Arm Region  Severe depletion  Thoracic and Lumbar Region  Severe depletion  Buccal Region  Severe depletion  Temple Region  Severe depletion  Clavicle Bone Region  Severe depletion  Clavicle and Acromion Bone Region  Severe depletion  Scapular Bone Region  Severe depletion  Dorsal Hand  Severe depletion  Patellar Region  Severe depletion  Anterior Thigh Region  Severe depletion  Posterior Calf Region  Severe depletion  Edema (RD Assessment)  None  Hair  Reviewed  Eyes  Reviewed  Mouth  Reviewed  Skin  Reviewed [[email or web address]]  Nails  Reviewed     Diet Order:   Diet Order            DIET DYS 3 Room service appropriate? Yes; Fluid consistency: Nectar Thick  Diet effective now             EDUCATION NEEDS:   Education needs have been addressed  Skin:  Skin Assessment: Reviewed RN Assessment  Last BM:  9/17  Height:   Ht Readings from Last 1 Encounters:  05/03/18 6' (1.829 m)    Weight:   Wt Readings  from Last 1 Encounters:  05/04/18 48.6 kg    Ideal Body Weight:  80.9 kg  BMI:  Body mass index is 14.54 kg/m.  Estimated Nutritional Needs:   Kcal:  1700-2000kcal/day   Protein:  78-88g/day   Fluid:  >1.4L/day   Koleen Distance MS, RD, LDN Pager #- [phone] Office#- [phone] After Hours Pager: [phone]

## 2018-05-04 NOTE — Progress Notes (Signed)
Mark [name] at [place] NAME: Mark [name]    MR#:  [id number]  DATE OF BIRTH:  1956/01/11  SUBJECTIVE: Patient admitted for sepsis, pneumonia.  He is better today with no shortness of breath, afebrile.  CHIEF COMPLAINT:   Chief Complaint  Patient presents with  . Shortness of Breath  . Altered Mental Status  Still has some cough but no shortness of breath..  Hypotensive, patient has chronic hypotension.  REVIEW OF SYSTEMS:   ROS CONSTITUTIONAL: No fever,  patient is weak.,  Appears cachectic with hoarse voice. EYES: No blurred or double vision.  EARS, NOSE, AND THROAT: No tinnitus or ear pain.  RESPIRATORY: No cough, shortness of breath, wheezing or hemoptysis.  CARDIOVASCULAR: No chest pain, orthopnea, edema.  GASTROINTESTINAL: No nausea, vomiting, diarrhea or abdominal pain.  GENITOURINARY: No dysuria, hematuria.  ENDOCRINE: No polyuria, nocturia,  HEMATOLOGY: No anemia, easy bruising or bleeding SKIN: No rash or lesion. MUSCULOSKELETAL: No joint pain or arthritis.   NEUROLOGIC: No tingling, numbness, weakness.  PSYCHIATRY: No anxiety or depression.   DRUG ALLERGIES:   Allergies  Allergen Reactions  . Morphine And Related Nausea And Vomiting    VITALS:  Blood pressure (!) 85/57, pulse 85, temperature 98.6 F (37 C), temperature source Oral, resp. rate 16, height 6' (1.829 m), weight 48.6 kg, SpO2 91 %.  PHYSICAL EXAMINATION:  GENERAL:  62 y.o.-year-old patient lying in the bed with no acute distress.  Cachectic.  EYES: Pupils equal, round, reactive to light and accommodation. No scleral icterus. Extraocular muscles intact.  HEENT: Head atraumatic, normocephalic. Oropharynx and nasopharynx clear.  NECK:  Supple, no jugular venous distention. No thyroid enlargement, no tenderness.  LUNGS: Normal breath sounds bilaterally, no wheezing, rales,rhonchi or crepitation. No use of accessory muscles of respiration.   CARDIOVASCULAR: S1, S2 normal. No murmurs, rubs, or gallops.  ABDOMEN: Soft, nontender, nondistended. Bowel sounds present. No organomegaly or mass.  EXTREMITIES: No pedal edema, cyanosis, or clubbing.  NEUROLOGIC: Cranial nerves II through XII are intact. Muscle strength 5/5 in all extremities. Sensation intact. Gait not checked.  PSYCHIATRIC: The patient is alert and oriented x 3.  SKIN: No obvious rash, lesion, or ulcer.    LABORATORY PANEL:   CBC Recent Labs  Lab 05/04/18 0438  WBC 11.7*  HGB 10.4*  HCT 30.8*  PLT 131*   ------------------------------------------------------------------------------------------------------------------  Chemistries  Recent Labs  Lab 05/03/18 1258 05/04/18 0438  NA 134* 137  K 3.7 3.8  CL 95* 109  CO2 30 28  GLUCOSE 140* 96  BUN 24* 22  CREATININE 0.89 0.61  CALCIUM 8.7* 8.0*  AST 26  --   ALT 15  --   ALKPHOS 66  --   BILITOT 0.6  --    ------------------------------------------------------------------------------------------------------------------  Cardiac Enzymes No results for input(s): TROPONINI in the last 168 hours. ------------------------------------------------------------------------------------------------------------------  RADIOLOGY:  Dg Chest 2 View  Result Date: 05/03/2018 CLINICAL DATA:  Shortness of breath and confusion EXAM: CHEST - 2 VIEW COMPARISON:  September 09, 2017 FINDINGS: There is diffuse interstitial thickening bilaterally. There are areas of scarring throughout the right lung, most notably in the right upper lobe. There is chronic blunting of each costophrenic angle, likely due to chronic scarring. There is no frank edema or consolidation. Heart size and pulmonary vascularity are normal. No adenopathy. No evident bone lesions. IMPRESSION: Diffuse interstitial thickening bilaterally. Scarring throughout right lung, most notably in the right upper lobe, stable. No edema or consolidation is  evident. Stable  cardiac silhouette. No adenopathy evident. Comment: Nipple shadows noted bilaterally. Electronically Signed   By: Lowella Grip III M.D.   On: 05/03/2018 13:25    EKG:   Orders placed or performed during the hospital encounter of 05/03/18  . EKG 12-Lead  . EKG 12-Lead  . EKG    ASSESSMENT AND PLAN:  Clinical sepsis present on admission due to pneumonia: Received vancomycin, cefepime, MRSA screen has been negative.  Changed to Zosyn.  Continue aspirin precautions.  Blood cultures have been negative leukocytosis improving, sputum culture showed few gram-positive cocci, rare yeast.  Follow-up for results.  #2. chronic hypotension ; increase the dose of Midodrin. 3.  Chronic pain, has history of tonsillar cancer status post surgery, chemotherapy, has hoarseness of voice. 4.  History of dysphagia, Candida esophagitis, history of esophageal stenosis status post dilatation.by Dr. Allen Norris on September 5  #5 history of COPD: No wheezing this time 6.  History of depression, patient is on Abilify, Klonopin, Cymbalta, patient expressed that he will space out the medicines at home and told the patient's nurse to space these medicines  #7. severe malnutrition in the context of chronic illness.: Continue Ensure, seen by dietitian.   Likely discharge home tomorrow. All the records are reviewed and case discussed with Care Management/Social Workerr. Management plans discussed with the patient, family and they are in agreement.  CODE STATUS: Full code  TOTAL TIME TAKING CARE OF THIS PATIENT;38 minutes.   POSSIBLE D/C IN 1-2 DAYS, DEPENDING ON CLINICAL CONDITION.   Epifanio Lesches M.D on 05/04/2018 at 1:48 PM  Between 7am to 6pm - Pager - [phone]  After 6pm go to [email or web address] - password [email or web address] [facility]  [facility]  Office  [phone]  CC: Primary care physician; Secundino Ginger, PA-C   Note: This dictation was prepared with Dragon dictation along with smaller phrase  technology. Any transcriptional errors that result from this process are unintentional.

## 2018-05-05 LAB — URINE CULTURE
Culture: NO GROWTH
SPECIAL REQUESTS: NORMAL

## 2018-05-05 LAB — PROCALCITONIN: PROCALCITONIN: 3.38 ng/mL

## 2018-05-05 MED ORDER — AMOXICILLIN-POT CLAVULANATE 875-125 MG PO TABS: 1.0000 | ORAL_TABLET | Freq: Two times a day (BID) | ORAL | 0 refills | Status: DC

## 2018-05-05 MED ORDER — MIDODRINE HCL 5 MG PO TABS: 5.0000 mg | ORAL_TABLET | Freq: Three times a day (TID) | ORAL | 0 refills | Status: DC

## 2018-05-05 MED ORDER — NEPRO/CARBSTEADY PO LIQD: 237.0000 mL | Freq: Three times a day (TID) | ORAL | 0 refills | Status: DC

## 2018-05-05 MED ORDER — ASCORBIC ACID 250 MG PO TABS: 250.0000 mg | ORAL_TABLET | Freq: Two times a day (BID) | ORAL | 0 refills | Status: AC

## 2018-05-05 MED ORDER — LEVOFLOXACIN 750 MG PO TABS: 750.0000 mg | ORAL_TABLET | Freq: Every day | ORAL | 0 refills | Status: DC

## 2018-05-05 MED ORDER — NEPRO/CARBSTEADY PO LIQD: 237.0000 mL | Freq: Three times a day (TID) | ORAL | 0 refills | Status: AC

## 2018-05-05 MED ORDER — AMOXICILLIN-POT CLAVULANATE 875-125 MG PO TABS: 1.0000 | ORAL_TABLET | Freq: Two times a day (BID) | ORAL | Status: DC

## 2018-05-05 MED ORDER — ADULT MULTIVITAMIN W/MINERALS CH: 1.0000 | ORAL_TABLET | Freq: Every day | ORAL | 0 refills | Status: AC

## 2018-05-05 NOTE — Discharge Summary (Signed)
[place] at [employer] NAME: Mark [name]    MR#:  [id number]  DATE OF BIRTH:  [birth year]  DATE OF ADMISSION:  05/03/2018 ADMITTING PHYSICIAN: Loletha Grayer, MD  DATE OF DISCHARGE: 05/05/2018 11:59 AM  PRIMARY CARE PHYSICIAN: Secundino Ginger, PA-C    ADMISSION DIAGNOSIS:  Severe sepsis ([place]) [A41.9, R65.20]  DISCHARGE DIAGNOSIS:  Active Problems:   Sepsis ([place])   SECONDARY DIAGNOSIS:   Past Medical History:  Diagnosis Date  . Arthritis    left hand  . Aspiration pneumonia ([place])   . Cancer ([place])    tonsilal ca  . COPD (chronic obstructive pulmonary disease) ([place])   . Incontinence    night time, began recently  . Memory deficit 11/18/2014  . PEG tube 11/18/2014   removed summer 2017  . Peripheral neuropathy 11/18/2014  . Smokers' cough ([place])   . Tonsillar cancer, S/P surgery, chemotherapy XRT 2009-followed at [facility] 11/18/2014  . Wears dentures    full upper and lower    HOSPITAL COURSE:   1.  Clinical sepsis with tachycardia, leukocytosis and aspiration pneumonia.  Patient was given vancomycin and cefepime in the ER.  Blood cultures negative.  MRSA PCR was negative so vancomycin was discontinued.  Sputum culture incubated for further analysis.  Patient was feeling much better.  Patient's procalcitonin was elevated at 592 and decreased to 3.38.  Patient wanted to go home.  Patient was given high-dose Levaquin to go home with for another 5 days. 2.  Chronic hypotension.  I started midodrine while here.  Blood pressure still on the lower side 3.  Chronic pain on medication. 4.  History of dysphasia.  Patient had esophageal dilatation recently and states he gets pneumonia after that. 5.  History of COPD.  No wheezing at this time.  Continue inhalers at home. 6.  History of depression and anxiety continue all of his psychiatric medications. 7.  Severe malnutrition.  Continue Ensure. 8.  History of tonsillar cancer. 9.  Tobacco abuse.   Patient advised smoking cessation counseling on presentation  DISCHARGE CONDITIONS:   Satisfactory  CONSULTS OBTAINED:  None  DRUG ALLERGIES:   Allergies  Allergen Reactions  . Morphine And Related Nausea And Vomiting    DISCHARGE MEDICATIONS:   Allergies as of 05/05/2018      Reactions   Morphine And Related Nausea And Vomiting      Medication List    TAKE these medications   albuterol 108 (90 Base) MCG/ACT inhaler Commonly known as:  PROVENTIL HFA;VENTOLIN HFA Inhale 2 puffs into the lungs every 6 (six) hours as needed for wheezing or shortness of breath.   ARIPiprazole 2 MG tablet Commonly known as:  ABILIFY Take 2 mg by mouth daily.   ascorbic acid 250 MG tablet Commonly known as:  VITAMIN C Take 1 tablet (250 mg total) by mouth 2 (two) times daily.   clonazePAM 1 MG tablet Commonly known as:  KLONOPIN Take 1 mg by mouth 2 (two) times daily.   DULoxetine 60 MG capsule Commonly known as:  CYMBALTA Take 60 mg by mouth daily.   feeding supplement (NEPRO CARB STEADY) Liqd Take 237 mLs by mouth 3 (three) times daily between meals.   Ipratropium-Albuterol 20-100 MCG/ACT Aers respimat Commonly known as:  COMBIVENT Inhale 2 puffs into the lungs 2 (two) times daily.   levofloxacin 750 MG tablet Commonly known as:  LEVAQUIN Take 1 tablet (750 mg total) by mouth at bedtime.   midodrine  5 MG tablet Commonly known as:  PROAMATINE Take 1 tablet (5 mg total) by mouth 3 (three) times daily with meals.   mometasone 50 MCG/ACT nasal spray Commonly known as:  NASONEX Place 2 sprays into both nostrils 2 (two) times daily.   multivitamin with minerals Tabs tablet Take 1 tablet by mouth daily.   Oxycodone HCl 20 MG Tabs Take 20 mg by mouth 4 (four) times daily.   pantoprazole 40 MG tablet Commonly known as:  PROTONIX Take 40 mg by mouth at bedtime.   pregabalin 150 MG capsule Commonly known as:  LYRICA Take 1 capsule by mouth 2 (two) times daily.    traZODone 150 MG tablet Commonly known as:  DESYREL Take 150 mg by mouth at bedtime.   venlafaxine 37.5 MG tablet Commonly known as:  EFFEXOR Take 37.5 mg by mouth 2 (two) times daily with a meal.        DISCHARGE INSTRUCTIONS:   Follow-up PMD 6 days  If you experience worsening of your admission symptoms, develop shortness of breath, life threatening emergency, suicidal or homicidal thoughts you must seek medical attention immediately by calling 911 or calling your MD immediately  if symptoms less severe.  You Must read complete instructions/literature along with all the possible adverse reactions/side effects for all the Medicines you take and that have been prescribed to you. Take any new Medicines after you have completely understood and accept all the possible adverse reactions/side effects.   Please note  You were cared for by a hospitalist during your hospital stay. If you have any questions about your discharge medications or the care you received while you were in the hospital after you are discharged, you can call the unit and asked to speak with the hospitalist on call if the hospitalist that took care of you is not available. Once you are discharged, your primary care physician will handle any further medical issues. Please note that NO REFILLS for any discharge medications will be authorized once you are discharged, as it is imperative that you return to your primary care physician (or establish a relationship with a primary care physician if you do not have one) for your aftercare needs so that they can reassess your need for medications and monitor your lab values.    Today   CHIEF COMPLAINT:   Chief Complaint  Patient presents with  . Shortness of Breath  . Altered Mental Status    HISTORY OF PRESENT ILLNESS:  Mark [name]  is a 62 y.o. male came in with shortness of breath and cough.   VITAL SIGNS:  Blood pressure 99/62, pulse 86, temperature 98 F (36.7  C), temperature source Oral, resp. rate 16, height 6' (1.829 m), weight 48.6 kg, SpO2 95 %.   PHYSICAL EXAMINATION:  GENERAL:  62 y.o.-year-old patient lying in the bed with no acute distress.  EYES: Pupils equal, round, reactive to light and accommodation. No scleral icterus. Extraocular muscles intact.  HEENT: Head atraumatic, normocephalic. Oropharynx and nasopharynx clear.  NECK:  Supple, no jugular venous distention. No thyroid enlargement, no tenderness.  LUNGS: Decreased breath sounds bilateral bases, no wheezing, rales,rhonchi or crepitation. No use of accessory muscles of respiration.  CARDIOVASCULAR: S1, S2 Mark. No murmurs, rubs, or gallops.  ABDOMEN: Soft, non-tender, non-distended. Bowel sounds present. No organomegaly or mass.  EXTREMITIES: No pedal edema, cyanosis, or clubbing.  NEUROLOGIC: Cranial nerves II through XII are intact. Muscle strength 5/5 in all extremities. Sensation intact. Gait not checked.  PSYCHIATRIC:  The patient is alert and oriented x 3.  SKIN: No obvious rash, lesion, or ulcer.   DATA REVIEW:   CBC Recent Labs  Lab 05/04/18 0438  WBC 11.7*  HGB 10.4*  HCT 30.8*  PLT 131*    Chemistries  Recent Labs  Lab 05/03/18 1258 05/04/18 0438  NA 134* 137  K 3.7 3.8  CL 95* 109  CO2 30 28  GLUCOSE 140* 96  BUN 24* 22  CREATININE 0.89 0.61  CALCIUM 8.7* 8.0*  AST 26  --   ALT 15  --   ALKPHOS 66  --   BILITOT 0.6  --      Microbiology Results  Results for orders placed or performed during the hospital encounter of 05/03/18  Culture, blood (Routine x 2)     Status: None (Preliminary result)   Collection Time: 05/03/18 12:58 PM  Result Value Ref Range Status   Specimen Description BLOOD RIGHT FA  Final   Special Requests   Final    BOTTLES DRAWN AEROBIC AND ANAEROBIC Blood Culture adequate volume   Culture   Final    NO GROWTH 2 DAYS Performed at [facility], [address] [place], [place] [address]    Report Status  PENDING  Incomplete  Culture, blood (Routine x 2)     Status: None (Preliminary result)   Collection Time: 05/03/18 12:58 PM  Result Value Ref Range Status   Specimen Description BLOOD LEFT AC  Final   Special Requests   Final    BOTTLES DRAWN AEROBIC AND ANAEROBIC Blood Culture adequate volume   Culture   Final    NO GROWTH 2 DAYS Performed at [facility], [address] [place], [place] [address]    Report Status PENDING  Incomplete  Urine Culture     Status: None   Collection Time: 05/03/18  4:19 PM  Result Value Ref Range Status   Specimen Description   Final    URINE, CLEAN CATCH Performed at [facility], [address], [place], [place] [address]    Special Requests   Final    Mark Performed at [facility], [address] [place], [place] [address]    Culture   Final    NO GROWTH Performed at [facility], [place] [address], [place], [place] [address]    Report Status 05/05/2018 FINAL  Final  MRSA PCR Screening     Status: None   Collection Time: 05/03/18  4:20 PM  Result Value Ref Range Status   MRSA by PCR NEGATIVE NEGATIVE Final    Comment:        The GeneXpert MRSA Assay (FDA approved for NASAL specimens only), is one component of a comprehensive MRSA colonization surveillance program. It is not intended to diagnose MRSA infection nor to guide or monitor treatment for MRSA infections. Performed at [facility], [address] [place], [place] [address]   Expectorated sputum assessment w rflx to resp cult     Status: None   Collection Time: 05/03/18  4:20 PM  Result Value Ref Range Status   Specimen Description SPU  Final   Special Requests Mark  Final   Sputum evaluation   Final    THIS SPECIMEN IS ACCEPTABLE FOR SPUTUM CULTURE Performed at [facility], [address], [place], [place] [address]    Report Status 05/03/2018 FINAL  Final  Culture, respiratory     Status: None (Preliminary  result)  Collection Time: 05/03/18  4:20 PM  Result Value Ref Range Status   Specimen Description   Final    SPU Performed at [facility], [address] [place], [place] [address]    Special Requests   Final    Mark Reflexed from [phone] Performed at [facility], [address] [place], [place] [address]    Gram Stain   Final    ABUNDANT WBC PRESENT, PREDOMINANTLY PMN FEW GRAM POSITIVE COCCI RARE YEAST    Culture   Final    CULTURE REINCUBATED FOR BETTER GROWTH Performed at [facility], [place] [address], [place],  [address]    Report Status PENDING  Incomplete      Management plans discussed with the patient, and he is in agreement.  CODE STATUS:  Code Status History    Date Active Date Inactive Code Status Order ID Comments User Context   05/03/2018 1455 05/05/2018 1505 Full Code [id number]  Loletha Grayer, MD ED   09/08/2017 1520 09/10/2017 1631 Full Code [id number]  Vaughan Basta, MD Inpatient   04/06/2017 0917 04/11/2017 1748 Full Code [id number]  Theodoro Grist, MD ED   04/23/2015 1301 04/25/2015 1757 Full Code [id number]  Loletha Grayer, MD ED   11/18/2014 1752 11/21/2014 1504 Full Code [id number]  Nita Sells, MD Inpatient      TOTAL TIME TAKING CARE OF THIS PATIENT: 35 minutes.    Loletha Grayer M.D on 05/05/2018 at 3:16 PM  Between 7am to 6pm - Pager - [phone]  After 6pm go to [email or web address] - Proofreader  [employer] Office  [phone]  CC: Primary care physician; Secundino Ginger, PA-C

## 2018-05-06 LAB — CULTURE, RESPIRATORY W GRAM STAIN: Culture: NORMAL

## 2018-05-06 LAB — CULTURE, RESPIRATORY: SPECIAL REQUESTS: NORMAL

## 2018-05-08 LAB — CULTURE, BLOOD (ROUTINE X 2)
CULTURE: NO GROWTH
Culture: NO GROWTH
SPECIAL REQUESTS: ADEQUATE
Special Requests: ADEQUATE

## 2018-09-13 ENCOUNTER — Other Ambulatory Visit: Payer: Self-pay | Admitting: Otolaryngology

## 2018-09-13 DIAGNOSIS — R1312 Dysphagia, oropharyngeal phase: Secondary | ICD-10-CM

## 2018-10-08 ENCOUNTER — Ambulatory Visit: Admission: RE | Admit: 2018-10-08 | Payer: Medicaid Other | Source: Ambulatory Visit

## 2019-07-03 ENCOUNTER — Emergency Department
Admission: EM | Admit: 2019-07-03 | Discharge: 2019-07-03 | Disposition: A | Discharge status: Home / Self Care | Payer: No Typology Code available for payment source | Attending: Emergency Medicine | Admitting: Emergency Medicine

## 2019-07-03 ENCOUNTER — Emergency Department: Payer: No Typology Code available for payment source

## 2019-07-03 ENCOUNTER — Other Ambulatory Visit: Payer: Self-pay

## 2019-07-03 DIAGNOSIS — F17210 Nicotine dependence, cigarettes, uncomplicated: Secondary | ICD-10-CM | POA: Insufficient documentation

## 2019-07-03 DIAGNOSIS — Y999 Unspecified external cause status: Secondary | ICD-10-CM | POA: Diagnosis not present

## 2019-07-03 DIAGNOSIS — Y92410 Unspecified street and highway as the place of occurrence of the external cause: Secondary | ICD-10-CM | POA: Diagnosis not present

## 2019-07-03 DIAGNOSIS — Y93I9 Activity, other involving external motion: Secondary | ICD-10-CM | POA: Diagnosis not present

## 2019-07-03 DIAGNOSIS — R1031 Right lower quadrant pain: Secondary | ICD-10-CM | POA: Insufficient documentation

## 2019-07-03 DIAGNOSIS — M545 Low back pain: Secondary | ICD-10-CM | POA: Insufficient documentation

## 2019-07-03 DIAGNOSIS — J449 Chronic obstructive pulmonary disease, unspecified: Secondary | ICD-10-CM | POA: Insufficient documentation

## 2019-07-03 DIAGNOSIS — Z85818 Personal history of malignant neoplasm of other sites of lip, oral cavity, and pharynx: Secondary | ICD-10-CM | POA: Diagnosis not present

## 2019-07-03 DIAGNOSIS — R1032 Left lower quadrant pain: Secondary | ICD-10-CM | POA: Insufficient documentation

## 2019-07-03 MED ORDER — LIDOCAINE 5 % EX PTCH: 1.0000 | MEDICATED_PATCH | CUTANEOUS | 0 refills | Status: DC
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
## 2019-11-28 ENCOUNTER — Ambulatory Visit: Payer: Medicaid Other

## 2020-01-18 ENCOUNTER — Other Ambulatory Visit: Payer: Self-pay

## 2020-01-18 ENCOUNTER — Encounter: Payer: Self-pay | Admitting: *Deleted

## 2020-01-18 ENCOUNTER — Ambulatory Visit: Payer: Medicaid Other | Admitting: Gastroenterology

## 2020-04-17 ENCOUNTER — Other Ambulatory Visit: Payer: Self-pay

## 2020-04-17 DIAGNOSIS — R4702 Dysphasia: Secondary | ICD-10-CM

## 2020-04-19 ENCOUNTER — Encounter: Payer: Self-pay | Admitting: Gastroenterology

## 2020-04-19 ENCOUNTER — Other Ambulatory Visit: Payer: Self-pay

## 2020-04-24 ENCOUNTER — Other Ambulatory Visit
Admission: RE | Admit: 2020-04-24 | Discharge: 2020-04-24 | Disposition: A | Discharge status: Home / Self Care | Payer: No Typology Code available for payment source | Source: Ambulatory Visit | Attending: Gastroenterology | Admitting: Gastroenterology

## 2020-04-24 ENCOUNTER — Other Ambulatory Visit: Payer: Self-pay

## 2020-04-24 DIAGNOSIS — Z01812 Encounter for preprocedural laboratory examination: Secondary | ICD-10-CM | POA: Diagnosis present

## 2020-04-24 DIAGNOSIS — Z20822 Contact with and (suspected) exposure to covid-19: Secondary | ICD-10-CM | POA: Diagnosis not present

## 2020-04-24 LAB — SARS CORONAVIRUS 2 (TAT 6-24 HRS): SARS Coronavirus 2: NEGATIVE

## 2020-04-25 NOTE — Discharge Instructions (Signed)
General Anesthesia, Adult, Care After This sheet gives you information about how to care for yourself after your procedure. Your health care provider may also give you more specific instructions. If you have problems or questions, contact your health care provider. What can I expect after the procedure? After the procedure, the following side effects are common:  Pain or discomfort at the IV site.  Nausea.  Vomiting.  Sore throat.  Trouble concentrating.  Feeling cold or chills.  Weak or tired.  Sleepiness and fatigue.  Soreness and body aches. These side effects can affect parts of the body that were not involved in surgery. Follow these instructions at home:  For at least 24 hours after the procedure:  Have a responsible adult stay with you. It is important to have someone help care for you until you are awake and alert.  Rest as needed.  Do not: ? Participate in activities in which you could fall or become injured. ? Drive. ? Use heavy machinery. ? Drink alcohol. ? Take sleeping pills or medicines that cause drowsiness. ? Make important decisions or sign legal documents. ? Take care of children on your own. Eating and drinking  Follow any instructions from your health care provider about eating or drinking restrictions.  When you feel hungry, start by eating small amounts of foods that are soft and easy to digest (bland), such as toast. Gradually return to your regular diet.  Drink enough fluid to keep your urine pale yellow.  If you vomit, rehydrate by drinking water, juice, or clear broth. General instructions  If you have sleep apnea, surgery and certain medicines can increase your risk for breathing problems. Follow instructions from your health care provider about wearing your sleep device: ? Anytime you are sleeping, including during daytime naps. ? While taking prescription pain medicines, sleeping medicines, or medicines that make you drowsy.  Return to  your normal activities as told by your health care provider. Ask your health care provider what activities are safe for you.  Take over-the-counter and prescription medicines only as told by your health care provider.  If you smoke, do not smoke without supervision.  Keep all follow-up visits as told by your health care provider. This is important. Contact a health care provider if:  You have nausea or vomiting that does not get better with medicine.  You cannot eat or drink without vomiting.  You have pain that does not get better with medicine.  You are unable to pass urine.  You develop a skin rash.  You have a fever.  You have redness around your IV site that gets worse. Get help right away if:  You have difficulty breathing.  You have chest pain.  You have blood in your urine or stool, or you vomit blood. Summary  After the procedure, it is common to have a sore throat or nausea. It is also common to feel tired.  Have a responsible adult stay with you for the first 24 hours after general anesthesia. It is important to have someone help care for you until you are awake and alert.  When you feel hungry, start by eating small amounts of foods that are soft and easy to digest (bland), such as toast. Gradually return to your regular diet.  Drink enough fluid to keep your urine pale yellow.  Return to your normal activities as told by your health care provider. Ask your health care provider what activities are safe for you. This information is not   intended to replace advice given to you by your health care provider. Make sure you discuss any questions you have with your health care provider. Document Revised: 08/07/2017 Document Reviewed: 03/20/2017 Elsevier Patient Education  2020 [employer]  

## 2020-04-26 ENCOUNTER — Ambulatory Visit: Payer: Medicaid Other | Admitting: Anesthesiology

## 2020-04-26 ENCOUNTER — Ambulatory Visit
Admission: RE | Admit: 2020-04-26 | Discharge: 2020-04-26 | Disposition: A | Discharge status: Home / Self Care | Payer: Medicaid Other | Attending: Gastroenterology | Admitting: Gastroenterology

## 2020-04-26 ENCOUNTER — Encounter
Admission: RE | Disposition: A | Discharge status: Home / Self Care | Payer: Self-pay | Source: Home / Self Care | Attending: Gastroenterology

## 2020-04-26 ENCOUNTER — Other Ambulatory Visit: Payer: Self-pay

## 2020-04-26 ENCOUNTER — Encounter: Payer: Self-pay | Admitting: Gastroenterology

## 2020-04-26 DIAGNOSIS — J449 Chronic obstructive pulmonary disease, unspecified: Secondary | ICD-10-CM | POA: Insufficient documentation

## 2020-04-26 DIAGNOSIS — Z79899 Other long term (current) drug therapy: Secondary | ICD-10-CM | POA: Diagnosis not present

## 2020-04-26 DIAGNOSIS — Z85818 Personal history of malignant neoplasm of other sites of lip, oral cavity, and pharynx: Secondary | ICD-10-CM | POA: Diagnosis not present

## 2020-04-26 DIAGNOSIS — Z800 Family history of malignant neoplasm of digestive organs: Secondary | ICD-10-CM | POA: Diagnosis not present

## 2020-04-26 DIAGNOSIS — K222 Esophageal obstruction: Secondary | ICD-10-CM | POA: Diagnosis not present

## 2020-04-26 DIAGNOSIS — G629 Polyneuropathy, unspecified: Secondary | ICD-10-CM | POA: Diagnosis not present

## 2020-04-26 DIAGNOSIS — R1310 Dysphagia, unspecified: Secondary | ICD-10-CM | POA: Diagnosis present

## 2020-04-26 DIAGNOSIS — Z8049 Family history of malignant neoplasm of other genital organs: Secondary | ICD-10-CM | POA: Insufficient documentation

## 2020-04-26 DIAGNOSIS — Z9049 Acquired absence of other specified parts of digestive tract: Secondary | ICD-10-CM | POA: Insufficient documentation

## 2020-04-26 DIAGNOSIS — F17210 Nicotine dependence, cigarettes, uncomplicated: Secondary | ICD-10-CM | POA: Insufficient documentation

## 2020-04-26 DIAGNOSIS — Z7951 Long term (current) use of inhaled steroids: Secondary | ICD-10-CM | POA: Insufficient documentation

## 2020-04-26 DIAGNOSIS — R32 Unspecified urinary incontinence: Secondary | ICD-10-CM | POA: Insufficient documentation

## 2020-04-26 DIAGNOSIS — Z7982 Long term (current) use of aspirin: Secondary | ICD-10-CM | POA: Diagnosis not present

## 2020-04-26 DIAGNOSIS — M19042 Primary osteoarthritis, left hand: Secondary | ICD-10-CM | POA: Diagnosis not present

## 2020-04-26 DIAGNOSIS — Z823 Family history of stroke: Secondary | ICD-10-CM | POA: Insufficient documentation

## 2020-04-26 HISTORY — PX: ESOPHAGOGASTRODUODENOSCOPY (EGD) WITH PROPOFOL: SHX5813

## 2020-04-26 HISTORY — PX: ESOPHAGEAL DILATION: SHX303

## 2020-04-26 SURGERY — ESOPHAGOGASTRODUODENOSCOPY (EGD) WITH PROPOFOL
Anesthesia: General | Site: Throat

## 2020-04-26 MED ORDER — PROPOFOL 10 MG/ML IV BOLUS
INTRAVENOUS | Status: DC | PRN
  Administered 2020-04-26 (×2): 50 mg via INTRAVENOUS

## 2020-04-26 MED ORDER — OXYCODONE HCL 5 MG/5ML PO SOLN: 5.0000 mg | Freq: Once | ORAL | Status: DC | PRN

## 2020-04-26 MED ORDER — GLYCOPYRROLATE 0.2 MG/ML IJ SOLN
INTRAMUSCULAR | Status: DC | PRN
  Administered 2020-04-26: .2 mg via INTRAVENOUS

## 2020-04-26 MED ORDER — STERILE WATER FOR IRRIGATION IR SOLN
Status: DC | PRN
  Administered 2020-04-26: .05 mL

## 2020-04-26 MED ORDER — PHENYLEPHRINE HCL (PRESSORS) 10 MG/ML IV SOLN
INTRAVENOUS | Status: DC | PRN
  Administered 2020-04-26: 100 ug via INTRAVENOUS

## 2020-04-26 MED ORDER — OXYCODONE HCL 5 MG PO TABS: 5.0000 mg | ORAL_TABLET | Freq: Once | ORAL | Status: DC | PRN

## 2020-04-26 MED ORDER — LACTATED RINGERS IV SOLN: INTRAVENOUS | Status: DC

## 2020-04-26 MED ORDER — LIDOCAINE HCL (CARDIAC) PF 100 MG/5ML IV SOSY
PREFILLED_SYRINGE | INTRAVENOUS | Status: DC | PRN
  Administered 2020-04-26: 60 mg via INTRAVENOUS

## 2020-04-26 SURGICAL SUPPLY — 9 items
BALLN DILATOR 12-15 8 (BALLOONS) ×4
BALLOON DILATOR 12-15 8 (BALLOONS) IMPLANT
BLOCK BITE 60FR ADLT L/F GRN (MISCELLANEOUS) ×4 IMPLANT
GOWN CVR UNV OPN BCK APRN NK (MISCELLANEOUS) ×4 IMPLANT
GOWN ISOL THUMB LOOP REG UNIV (MISCELLANEOUS) ×8
KIT ENDO PROCEDURE OLY (KITS) ×4 IMPLANT
MANIFOLD NEPTUNE II (INSTRUMENTS) ×4 IMPLANT
SYR INFLATION 60ML (SYRINGE) ×2 IMPLANT
WATER STERILE IRR 250ML POUR (IV SOLUTION) ×4 IMPLANT

## 2020-04-26 NOTE — H&P (Signed)
[name], MD [address] [place], [place] [address] [phone] Fax : [phone]  Primary Care Physician:  Secundino Ginger, PA-C Primary Gastroenterologist:  Dr. Allen Norris  Pre-Procedure History & Physical: HPI:  Mark [name] is a 64 y.o. male is here for an endoscopy.   Past Medical History:  Diagnosis Date  . Arthritis    left hand  . Aspiration pneumonia ([place])   . Cancer ([place])    tonsilal ca  . COPD (chronic obstructive pulmonary disease) ([place])   . Incontinence    night time, began recently  . Memory deficit 11/18/2014  . PEG tube 11/18/2014   removed summer 2017  . Peripheral neuropathy 11/18/2014  . Smokers' cough ([place])   . Tonsillar cancer, S/P surgery, chemotherapy XRT 2009-followed at [facility] 11/18/2014  . Wears dentures    full upper and lower    Past Surgical History:  Procedure Laterality Date  . APPENDECTOMY    . CHOLECYSTECTOMY  2007  . ESOPHAGEAL DILATION N/A 08/01/2016   Procedure: ESOPHAGEAL DILATION;  Surgeon: [name], MD;  Location: [place];  Service: Endoscopy;  Laterality: N/A;  . ESOPHAGEAL DILATION N/A 02/19/2017   Procedure: ESOPHAGEAL DILATION;  Surgeon: [name], MD;  Location: [place];  Service: Endoscopy;  Laterality: N/A;  . ESOPHAGEAL DILATION N/A 08/27/2017   Procedure: ESOPHAGEAL DILATION;  Surgeon: [name], MD;  Location: [place];  Service: Endoscopy;  Laterality: N/A;  . ESOPHAGOGASTRODUODENOSCOPY (EGD) WITH PROPOFOL N/A 01/24/2016   Procedure: ESOPHAGOGASTRODUODENOSCOPY (EGD) WITH gastric biopsy and esophageal dilation.;  Surgeon: [name], MD;  Location: [place];  Service: Endoscopy;  Laterality: N/A;  . ESOPHAGOGASTRODUODENOSCOPY (EGD) WITH PROPOFOL N/A 08/01/2016   Procedure: ESOPHAGOGASTRODUODENOSCOPY (EGD) WITH PROPOFOL;  Surgeon: [name], MD;  Location: [place];  Service: Endoscopy;  Laterality: N/A;  . ESOPHAGOGASTRODUODENOSCOPY  (EGD) WITH PROPOFOL N/A 02/19/2017   Procedure: ESOPHAGOGASTRODUODENOSCOPY (EGD) WITH PROPOFOL;  Surgeon: [name], MD;  Location: [place];  Service: Endoscopy;  Laterality: N/A;  . ESOPHAGOGASTRODUODENOSCOPY (EGD) WITH PROPOFOL N/A 08/27/2017   Procedure: ESOPHAGOGASTRODUODENOSCOPY (EGD) WITH PROPOFOL;  Surgeon: [name], MD;  Location: [place];  Service: Endoscopy;  Laterality: N/A;  . ESOPHAGOGASTRODUODENOSCOPY (EGD) WITH PROPOFOL N/A 03/26/2018   Procedure: ESOPHAGOGASTRODUODENOSCOPY (EGD) WITH PROPOFOL;  Surgeon: Jonathon Bellows, MD;  Location: [facility] ENDOSCOPY;  Service: Gastroenterology;  Laterality: N/A;  . ESOPHAGOGASTRODUODENOSCOPY (EGD) WITH PROPOFOL N/A 04/22/2018   Procedure: ESOPHAGOGASTRODUODENOSCOPY (EGD) WITH PROPOFOL;  Surgeon: [name], MD;  Location: [place];  Service: Endoscopy;  Laterality: N/A;  . ESOPHAGOSCOPY WITH DILITATION N/A 04/22/2018   Procedure: ESOPHAGOSCOPY WITH DILITATION;  Surgeon: [name], MD;  Location: [place];  Service: Endoscopy;  Laterality: N/A;  . PEG PLACEMENT N/A 11/28/2015   Procedure: PERCUTANEOUS ENDOSCOPIC GASTROSTOMY (PEG) PLACEMENT;  Surgeon: [name], MD;  Location: [place];  Service: Endoscopy;  Laterality: N/A;  . PEG TUBE PLACEMENT  10/22/12   10/22/12 - [facility], 10/23/14 - [facility], Dr. Allen Norris, replaced  . 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 Authorizing Provider  albuterol (PROVENTIL HFA;VENTOLIN HFA) 108 (90 BASE) MCG/ACT inhaler Inhale 2 puffs into the lungs every 6 (six) hours as needed for wheezing or shortness of breath.    Yes [provider]  ARIPiprazole (ABILIFY) 2 MG tablet Take 2 mg by mouth daily.    Yes [provider]  aspirin EC 81 MG tablet 81 mg.   Yes [provider]  budesonide-formoterol (SYMBICORT) 160-4.5 MCG/ACT inhaler Inhale into the lungs.   Yes [provider]  buPROPion (WELLBUTRIN SR) 150 MG 12 hr tablet 150 mg.   Yes [provider]  clonazePAM (KLONOPIN) 1 MG tablet Take 1 mg by mouth 2 (two) times daily.    Yes [provider]  DULoxetine (CYMBALTA) 60 MG capsule Take 60 mg by mouth daily.   Yes [provider]  Ipratropium-Albuterol (COMBIVENT) 20-100 MCG/ACT AERS respimat Inhale 2 puffs into the lungs 2 (two) times daily.   Yes [provider]  mometasone (NASONEX) 50 MCG/ACT nasal spray Place 2 sprays into both nostrils 2 (two) times daily.   Yes [provider]  Multiple Vitamin (MULTIVITAMIN WITH MINERALS) TABS tablet Take 1 tablet by mouth daily. 05/05/18  Yes Wieting, Richard, MD  Nutritional Supplements (FEEDING SUPPLEMENT, NEPRO CARB STEADY,) LIQD Take 237 mLs by mouth 3 (three) times daily between meals. 05/05/18  Yes Wieting, Richard, MD  Oxycodone HCl 20 MG TABS Take 20 mg by mouth 4 (four) times daily.    Yes [provider]  pantoprazole (PROTONIX) 40 MG tablet Take 40 mg by mouth at bedtime.    Yes [provider]  pregabalin (LYRICA) 150 MG capsule Take 1 capsule by mouth 2 (two) times daily. 04/13/18  Yes [provider]  traZODone (DESYREL) 150 MG tablet Take 150 mg by mouth at bedtime.    Yes [provider]  venlafaxine (EFFEXOR) 37.5 MG tablet Take 37.5 mg by mouth 2 (two) times daily with a meal.    Yes [provider]  vitamin C (VITAMIN C) 250 MG tablet Take 1 tablet (250 mg total) by mouth 2 (two) times daily. 05/05/18  Yes Loletha Grayer, MD    Allergies as of 04/17/2020 - Review Complete 07/03/2019  Allergen Reaction Noted  . Morphine and related Nausea And Vomiting 11/18/2014    Family History  Problem Relation Age of Onset  . Pancreatic cancer Mother   . Stroke Father   . Testicular cancer Father   . Suicidality Father     Social History   Socioeconomic History  . Marital status: Married    Spouse name:  Not on file  . Number of children: Not on file  . Years of education: Not on file  . Highest education level: Not on file  Occupational History  . Not on file  Tobacco Use  . Smoking status: Current Every Day Smoker    Years: 40.00  . Smokeless tobacco: Never Used  . Tobacco comment: was 2 PPD, now 2 cigs/day  Vaping Use  . Vaping Use: Never used  Substance and Sexual Activity  . Alcohol use: No  . Drug use: No  . Sexual activity: Not on file  Other Topics Concern  . 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 Difficulty of Paying Living Expenses: Not on file  Food Insecurity:   . Worried About Charity fundraiser in the Last Year: Not on file  . Ran Out of Food in the Last Year: Not on file  Transportation Needs:   . Lack of Transportation (Medical): Not on file  . Lack of Transportation (Non-Medical): Not on file  Physical Activity:   .  Days of Exercise per Week: Not on file  . Minutes of Exercise per Session: Not on file  Stress:   . Feeling of Stress : Not on file  Social Connections:   . Frequency of Communication with Friends and Family: Not on file  . Frequency of Social Gatherings with Friends and Family: Not on file  . Attends Religious Services: Not on file  . Active Member of Clubs or Organizations: Not on file  . Attends Archivist Meetings: Not on file  . Marital Status: Not on file  Intimate Partner Violence:   . Fear of Current or Ex-Partner: Not on file  . Emotionally Abused: Not on file  . Physically Abused: Not on file  . Sexually Abused: Not on file    Review of Systems: See HPI, otherwise negative ROS  Physical Exam: Ht 6' (1.829 m)   Wt 50.8 kg   BMI 15.19 kg/m  General:   Alert,  pleasant and cooperative in NAD Head:  Normocephalic and atraumatic. Neck:  Supple; no masses  or thyromegaly. Lungs:  Clear throughout to auscultation.    Heart:  Regular rate and rhythm. Abdomen:  Soft, nontender and nondistended. Normal bowel sounds, without guarding, and without rebound.   Neurologic:  Alert and  oriented x4;  grossly normal neurologically.  Impression/Plan: OSVALDO LAMPING is here for an endoscopy to be performed for dysphagia  Risks, benefits, limitations, and alternatives regarding  endoscopy have been reviewed with the patient.  Questions have been answered.  All parties agreeable.   [name], MD  04/26/2020, 9:22 AM

## 2020-04-26 NOTE — Anesthesia Preprocedure Evaluation (Signed)
Anesthesia Evaluation  Patient identified by MRN, date of birth, ID band Patient awake    Reviewed: Allergy & Precautions, H&P , NPO status , Patient's Chart, lab work & pertinent test results  Airway Mallampati: II  TM Distance: >3 FB Neck ROM: full    Dental  (+) Edentulous Upper   Pulmonary pneumonia, COPD, Current Smoker and Patient abstained from smoking.,    Pulmonary exam normal breath sounds clear to auscultation       Cardiovascular negative cardio ROS Normal cardiovascular exam Rhythm:regular Rate:Normal     Neuro/Psych negative neurological ROS     GI/Hepatic negative GI ROS, Neg liver ROS,   Endo/Other  negative endocrine ROS  Renal/GU negative Renal ROS  negative genitourinary   Musculoskeletal   Abdominal   Peds  Hematology negative hematology ROS (+)   Anesthesia Other Findings   Reproductive/Obstetrics                            Anesthesia Physical Anesthesia Plan  ASA: III  Anesthesia Plan: General   Post-op Pain Management:    Induction:   PONV Risk Score and Plan:   Airway Management Planned:   Additional Equipment:   Intra-op Plan:   Post-operative Plan:   Informed Consent: I have reviewed the patients History and Physical, chart, labs and discussed the procedure including the risks, benefits and alternatives for the proposed anesthesia with the patient or authorized representative who has indicated his/her understanding and acceptance.       Plan Discussed with:   Anesthesia Plan Comments:         Anesthesia Quick Evaluation

## 2020-04-26 NOTE — Anesthesia Procedure Notes (Signed)
Procedure Name: MAC Date/Time: 04/26/2020 10:00 AM Performed by: Silvana Newness, CRNA Pre-anesthesia Checklist: Patient identified, Emergency Drugs available, Suction available, Patient being monitored and Timeout performed Patient Re-evaluated:Patient Re-evaluated prior to induction Oxygen Delivery Method: Nasal cannula Induction Type: IV induction Placement Confirmation: positive ETCO2

## 2020-04-26 NOTE — Op Note (Signed)
[facility] Gastroenterology Patient Name: Mark [name] Procedure Date: 04/26/2020 9:49 AM MRN: [id number] Account #: [id number] Date of Birth: [birth year] Admit Type: Outpatient Age: 64 Room: [facility] OR ROOM 01 Gender: Male Note Status: Finalized Procedure:             Upper GI endoscopy Indications:           Dysphagia Providers:             [name] MD, MD Referring MD:          Otelia Limes. Duran PA, PA (Referring MD) Medicines:             Propofol per Anesthesia Complications:         No immediate complications. Procedure:             Pre-Anesthesia Assessment:                        - Prior to the procedure, a History and Physical was                         performed, and patient medications and allergies were                         reviewed. The patient's tolerance of previous                         anesthesia was also reviewed. The risks and benefits                         of the procedure and the sedation options and risks                         were discussed with the patient. All questions were                         answered, and informed consent was obtained. Prior                         Anticoagulants: The patient has taken no previous                         anticoagulant or antiplatelet agents. ASA Grade                         Assessment: II - A patient with mild systemic disease.                         After reviewing the risks and benefits, the patient                         was deemed in satisfactory condition to undergo the                         procedure.                        After obtaining informed consent, the endoscope was  passed under direct vision. Throughout the procedure,                         the patient's blood pressure, pulse, and oxygen                         saturations were monitored continuously. The was                         introduced through the mouth, and advanced to the                          second part of duodenum. The upper GI endoscopy was                         accomplished without difficulty. The patient tolerated                         the procedure well. Findings:      Two benign-appearing, intrinsic moderate stenoses were found at the       lower esophageal and upper esophageal sphincter. The stenoses were       traversed. A TTS dilator was passed through the scope. Dilation with a       12-13.5-15 mm balloon dilator was performed to 15 mm. The dilation site       was examined following endoscope reinsertion and showed moderate mucosal       disruption.      The stomach was normal.      The examined duodenum was normal. Impression:            - Benign-appearing esophageal stenoses in the distal                         and proximal esophagus. Dilated.                        - Normal stomach.                        - Normal examined duodenum.                        - No specimens collected. Recommendation:        - Discharge patient to home.                        - Resume previous diet.                        - Continue present medications.                        - Repeat upper endoscopy PRN for retreatment. Procedure Code(s):     --- Professional ---                        [phone], Esophagogastroduodenoscopy, flexible,                         transoral; with transendoscopic balloon dilation of  esophagus (less than 30 mm diameter) Diagnosis Code(s):     --- Professional ---                        R13.10, Dysphagia, unspecified                        K22.2, Esophageal obstruction CPT copyright 2019 American Medical Association. All rights reserved. The codes documented in this report are preliminary and upon coder review may  be revised to meet current compliance requirements. [name] MD, MD 04/26/2020 10:13:54 AM This report has been signed electronically. Number of Addenda: 0 Note Initiated On: 04/26/2020 9:49 AM Total  Procedure Duration: 0 hours 4 minutes 19 seconds  Estimated Blood Loss:  Estimated blood loss: none.      [facility]

## 2020-04-26 NOTE — Transfer of Care (Signed)
Immediate Anesthesia Transfer of Care Note  Patient: Mark [name]  Procedure(s) Performed: ESOPHAGOGASTRODUODENOSCOPY (EGD) WITH PROPOFOL (N/A Throat) ESOPHAGEAL DILATION (Esophagus)  Patient Location: PACU  Anesthesia Type: General  Level of Consciousness: awake, alert  and patient cooperative  Airway and Oxygen Therapy: Patient Spontanous Breathing and Patient connected to supplemental oxygen  Post-op Assessment: Post-op Vital signs reviewed, Patient's Cardiovascular Status Stable, Respiratory Function Stable, Patent Airway and No signs of Nausea or vomiting  Post-op Vital Signs: Reviewed and stable  Complications: No complications documented.

## 2020-04-26 NOTE — Anesthesia Postprocedure Evaluation (Signed)
Anesthesia Post Note  Patient: Mark [name]  Procedure(s) Performed: ESOPHAGOGASTRODUODENOSCOPY (EGD) WITH PROPOFOL (N/A Throat) ESOPHAGEAL DILATION (Esophagus)     Patient location during evaluation: PACU Anesthesia Type: General Level of consciousness: awake and alert Pain management: pain level controlled Vital Signs Assessment: post-procedure vital signs reviewed and stable Respiratory status: spontaneous breathing Cardiovascular status: stable Postop Assessment: no headache Anesthetic complications: no   No complications documented.  Gillian Scarce

## 2020-04-27 ENCOUNTER — Encounter: Payer: Self-pay | Admitting: Gastroenterology

## 2020-07-20 ENCOUNTER — Ambulatory Visit: Payer: Self-pay

## 2020-07-20 NOTE — Telephone Encounter (Signed)
Pt. Reports he received his COVID 19 booster Monday and Tuesday started feeling bad - body aches, cough, shortness of breath. Instructed pt. To call his PCP for further recommendations.   Answer Assessment - Initial Assessment Questions 1. SYMPTOMS: "What is the main symptom?" (e.g., redness, swelling, pain)      Cough, dizzy, muscle aches 2. ONSET: "When was the vaccine (shot) given?" "How much later did the body aches begin?" (e.g., hours, days ago)      Tuesday 3. SEVERITY: "How bad is it?"      Moderate 4. FEVER: "Is there a fever?" If Yes, ask: "What is it, how was it measured, and when did it start?"      No 5. IMMUNIZATIONS GIVEN: "What shots have you recently received?"     Pfizer booster 6. PAST REACTIONS: "Have you reacted to immunizations before?" If Yes, ask: "What happened?"     No 7. OTHER SYMPTOMS: "Do you have any other symptoms?"     No  Protocols used: IMMUNIZATION REACTIONS-A-AH

## 2020-07-29 ENCOUNTER — Inpatient Hospital Stay: Payer: Medicaid Other

## 2020-07-29 ENCOUNTER — Emergency Department: Payer: Medicaid Other

## 2020-07-29 ENCOUNTER — Encounter: Payer: Self-pay | Admitting: Emergency Medicine

## 2020-07-29 ENCOUNTER — Other Ambulatory Visit: Payer: Self-pay

## 2020-07-29 ENCOUNTER — Inpatient Hospital Stay
Admission: EM | Admit: 2020-07-29 | Discharge: 2020-07-31 | DRG: 871 | Disposition: A | Discharge status: Home / Self Care | Payer: Medicaid Other | Attending: Internal Medicine | Admitting: Internal Medicine

## 2020-07-29 DIAGNOSIS — Z20822 Contact with and (suspected) exposure to covid-19: Secondary | ICD-10-CM | POA: Diagnosis present

## 2020-07-29 DIAGNOSIS — Z79899 Other long term (current) drug therapy: Secondary | ICD-10-CM | POA: Diagnosis not present

## 2020-07-29 DIAGNOSIS — J189 Pneumonia, unspecified organism: Secondary | ICD-10-CM | POA: Diagnosis present

## 2020-07-29 DIAGNOSIS — J9621 Acute and chronic respiratory failure with hypoxia: Secondary | ICD-10-CM | POA: Diagnosis present

## 2020-07-29 DIAGNOSIS — G629 Polyneuropathy, unspecified: Secondary | ICD-10-CM | POA: Diagnosis present

## 2020-07-29 DIAGNOSIS — A419 Sepsis, unspecified organism: Secondary | ICD-10-CM | POA: Diagnosis present

## 2020-07-29 DIAGNOSIS — R636 Underweight: Secondary | ICD-10-CM | POA: Diagnosis present

## 2020-07-29 DIAGNOSIS — Z7982 Long term (current) use of aspirin: Secondary | ICD-10-CM

## 2020-07-29 DIAGNOSIS — Z9981 Dependence on supplemental oxygen: Secondary | ICD-10-CM

## 2020-07-29 DIAGNOSIS — J441 Chronic obstructive pulmonary disease with (acute) exacerbation: Secondary | ICD-10-CM | POA: Diagnosis present

## 2020-07-29 DIAGNOSIS — F419 Anxiety disorder, unspecified: Secondary | ICD-10-CM | POA: Diagnosis present

## 2020-07-29 DIAGNOSIS — R0602 Shortness of breath: Secondary | ICD-10-CM | POA: Diagnosis present

## 2020-07-29 DIAGNOSIS — Z8043 Family history of malignant neoplasm of testis: Secondary | ICD-10-CM | POA: Diagnosis not present

## 2020-07-29 DIAGNOSIS — J440 Chronic obstructive pulmonary disease with acute lower respiratory infection: Secondary | ICD-10-CM | POA: Diagnosis present

## 2020-07-29 DIAGNOSIS — Z681 Body mass index (BMI) 19 or less, adult: Secondary | ICD-10-CM

## 2020-07-29 DIAGNOSIS — R6520 Severe sepsis without septic shock: Secondary | ICD-10-CM | POA: Diagnosis present

## 2020-07-29 DIAGNOSIS — Z923 Personal history of irradiation: Secondary | ICD-10-CM

## 2020-07-29 DIAGNOSIS — Z9221 Personal history of antineoplastic chemotherapy: Secondary | ICD-10-CM | POA: Diagnosis not present

## 2020-07-29 DIAGNOSIS — Z79890 Hormone replacement therapy (postmenopausal): Secondary | ICD-10-CM | POA: Diagnosis not present

## 2020-07-29 DIAGNOSIS — Z79891 Long term (current) use of opiate analgesic: Secondary | ICD-10-CM | POA: Diagnosis not present

## 2020-07-29 DIAGNOSIS — J411 Mucopurulent chronic bronchitis: Secondary | ICD-10-CM

## 2020-07-29 DIAGNOSIS — F32A Depression, unspecified: Secondary | ICD-10-CM | POA: Diagnosis present

## 2020-07-29 DIAGNOSIS — Z823 Family history of stroke: Secondary | ICD-10-CM

## 2020-07-29 DIAGNOSIS — J9611 Chronic respiratory failure with hypoxia: Secondary | ICD-10-CM | POA: Diagnosis not present

## 2020-07-29 DIAGNOSIS — J9601 Acute respiratory failure with hypoxia: Secondary | ICD-10-CM | POA: Diagnosis present

## 2020-07-29 DIAGNOSIS — Z85818 Personal history of malignant neoplasm of other sites of lip, oral cavity, and pharynx: Secondary | ICD-10-CM

## 2020-07-29 DIAGNOSIS — Z800 Family history of malignant neoplasm of digestive organs: Secondary | ICD-10-CM

## 2020-07-29 DIAGNOSIS — R1314 Dysphagia, pharyngoesophageal phase: Secondary | ICD-10-CM | POA: Diagnosis present

## 2020-07-29 DIAGNOSIS — R1319 Other dysphagia: Secondary | ICD-10-CM | POA: Diagnosis not present

## 2020-07-29 DIAGNOSIS — J9610 Chronic respiratory failure, unspecified whether with hypoxia or hypercapnia: Secondary | ICD-10-CM | POA: Diagnosis present

## 2020-07-29 DIAGNOSIS — J449 Chronic obstructive pulmonary disease, unspecified: Secondary | ICD-10-CM | POA: Diagnosis present

## 2020-07-29 LAB — RESP PANEL BY RT-PCR (FLU A&B, COVID) ARPGX2
Influenza A by PCR: NEGATIVE
Influenza B by PCR: NEGATIVE
SARS Coronavirus 2 by RT PCR: NEGATIVE

## 2020-07-29 LAB — CBC WITH DIFFERENTIAL/PLATELET
Abs Immature Granulocytes: 0.1 10*3/uL — ABNORMAL HIGH (ref 0.00–0.07)
Basophils Absolute: 0 10*3/uL (ref 0.0–0.1)
Basophils Relative: 0 %
Eosinophils Absolute: 0 10*3/uL (ref 0.0–0.5)
Eosinophils Relative: 0 %
HCT: 38.1 % — ABNORMAL LOW (ref 39.0–52.0)
Hemoglobin: 12.4 g/dL — ABNORMAL LOW (ref 13.0–17.0)
Immature Granulocytes: 1 %
Lymphocytes Relative: 5 %
Lymphs Abs: 1 10*3/uL (ref 0.7–4.0)
MCH: 30.4 pg (ref 26.0–34.0)
MCHC: 32.5 g/dL (ref 30.0–36.0)
MCV: 93.4 fL (ref 80.0–100.0)
Monocytes Absolute: 0.7 10*3/uL (ref 0.1–1.0)
Monocytes Relative: 3 %
Neutro Abs: 19.3 10*3/uL — ABNORMAL HIGH (ref 1.7–7.7)
Neutrophils Relative %: 91 %
Platelets: 228 10*3/uL (ref 150–400)
RBC: 4.08 MIL/uL — ABNORMAL LOW (ref 4.22–5.81)
RDW: 13.7 % (ref 11.5–15.5)
WBC: 21.2 10*3/uL — ABNORMAL HIGH (ref 4.0–10.5)
nRBC: 0 % (ref 0.0–0.2)

## 2020-07-29 LAB — COMPREHENSIVE METABOLIC PANEL
ALT: 16 U/L (ref 0–44)
AST: 28 U/L (ref 15–41)
Albumin: 3.7 g/dL (ref 3.5–5.0)
Alkaline Phosphatase: 84 U/L (ref 38–126)
Anion gap: 10 (ref 5–15)
BUN: 12 mg/dL (ref 8–23)
CO2: 30 mmol/L (ref 22–32)
Calcium: 8.7 mg/dL — ABNORMAL LOW (ref 8.9–10.3)
Chloride: 98 mmol/L (ref 98–111)
Creatinine, Ser: 0.77 mg/dL (ref 0.61–1.24)
GFR, Estimated: >60 mL/min (ref >60)
Glucose, Bld: 216 mg/dL — ABNORMAL HIGH (ref 70–99)
Potassium: 3.7 mmol/L (ref 3.5–5.1)
Sodium: 138 mmol/L (ref 135–145)
Total Bilirubin: 0.6 mg/dL (ref 0.3–1.2)
Total Protein: 7.5 g/dL (ref 6.5–8.1)

## 2020-07-29 LAB — MAGNESIUM: Magnesium: 2 mg/dL (ref 1.7–2.4)

## 2020-07-29 LAB — TROPONIN I (HIGH SENSITIVITY)
Troponin I (High Sensitivity): 8 ng/L (ref <18)
Troponin I (High Sensitivity): 7 ng/L (ref <18)

## 2020-07-29 LAB — LACTIC ACID, PLASMA: Lactic Acid, Venous: 1.2 mmol/L (ref 0.5–1.9)

## 2020-07-29 LAB — BRAIN NATRIURETIC PEPTIDE: B Natriuretic Peptide: 216.1 pg/mL — ABNORMAL HIGH (ref 0.0–100.0)

## 2020-07-29 LAB — HIV ANTIBODY (ROUTINE TESTING W REFLEX): HIV Screen 4th Generation wRfx: NONREACTIVE

## 2020-07-29 MED ORDER — ACETAMINOPHEN 500 MG PO TABS
1000.0000 mg | ORAL_TABLET | Freq: Once | ORAL | Status: AC
  Administered 2020-07-29: 11:00:00 1000 mg via ORAL
  Filled 2020-07-29: qty 2

## 2020-07-29 MED ORDER — ENOXAPARIN SODIUM 40 MG/0.4ML ~~LOC~~ SOLN
40.0000 mg | SUBCUTANEOUS | Status: DC
  Administered 2020-07-29 – 2020-07-30 (×2): 40 mg via SUBCUTANEOUS
  Filled 2020-07-29 (×2): qty 0.4

## 2020-07-29 MED ORDER — LACTATED RINGERS IV BOLUS (SEPSIS)
20.0000 mL/kg | Freq: Once | INTRAVENOUS | Status: AC
  Administered 2020-07-29: 11:00:00 1000 mL via INTRAVENOUS

## 2020-07-29 MED ORDER — NEPRO/CARBSTEADY PO LIQD
237.0000 mL | Freq: Three times a day (TID) | ORAL | Status: DC
  Administered 2020-07-29 – 2020-07-31 (×5): 237 mL via ORAL

## 2020-07-29 MED ORDER — ACETAMINOPHEN 325 MG PO TABS: 650.0000 mg | ORAL_TABLET | Freq: Four times a day (QID) | ORAL | Status: DC | PRN

## 2020-07-29 MED ORDER — ACETAMINOPHEN 650 MG RE SUPP: 650.0000 mg | Freq: Four times a day (QID) | RECTAL | Status: DC | PRN

## 2020-07-29 MED ORDER — SODIUM CHLORIDE 0.9 % IV SOLN
500.0000 mg | INTRAVENOUS | Status: DC
  Administered 2020-07-30: 10:00:00 500 mg via INTRAVENOUS
  Filled 2020-07-29: qty 500

## 2020-07-29 MED ORDER — ONDANSETRON HCL 4 MG/2ML IJ SOLN: 4.0000 mg | Freq: Four times a day (QID) | INTRAMUSCULAR | Status: DC | PRN

## 2020-07-29 MED ORDER — ASPIRIN EC 81 MG PO TBEC
81.0000 mg | DELAYED_RELEASE_TABLET | Freq: Every day | ORAL | Status: DC
  Administered 2020-07-29: 15:00:00 81 mg via ORAL
  Filled 2020-07-29 (×2): qty 1

## 2020-07-29 MED ORDER — VENLAFAXINE HCL 37.5 MG PO TABS
37.5000 mg | ORAL_TABLET | Freq: Two times a day (BID) | ORAL | Status: DC
  Administered 2020-07-29 – 2020-07-31 (×4): 37.5 mg via ORAL
  Filled 2020-07-29 (×6): qty 1

## 2020-07-29 MED ORDER — PREGABALIN 75 MG PO CAPS
150.0000 mg | ORAL_CAPSULE | Freq: Two times a day (BID) | ORAL | Status: DC
  Administered 2020-07-29 – 2020-07-31 (×4): 150 mg via ORAL
  Filled 2020-07-29 (×4): qty 2

## 2020-07-29 MED ORDER — LACTATED RINGERS IV SOLN: INTRAVENOUS | Status: AC

## 2020-07-29 MED ORDER — SODIUM CHLORIDE 0.9 % IV SOLN
500.0000 mg | INTRAVENOUS | Status: DC
  Administered 2020-07-29: 12:00:00 500 mg via INTRAVENOUS
  Filled 2020-07-29: qty 500

## 2020-07-29 MED ORDER — SODIUM CHLORIDE 0.9 % IV SOLN: INTRAVENOUS | Status: DC

## 2020-07-29 MED ORDER — OXYCODONE HCL 5 MG PO TABS
20.0000 mg | ORAL_TABLET | Freq: Four times a day (QID) | ORAL | Status: DC
  Administered 2020-07-29 – 2020-07-31 (×8): 20 mg via ORAL
  Filled 2020-07-29 (×8): qty 4

## 2020-07-29 MED ORDER — IPRATROPIUM BROMIDE 0.02 % IN SOLN
0.5000 mg | Freq: Once | RESPIRATORY_TRACT | Status: AC
  Administered 2020-07-29: 13:00:00 0.5 mg via RESPIRATORY_TRACT
  Filled 2020-07-29: qty 2.5

## 2020-07-29 MED ORDER — ONDANSETRON HCL 4 MG PO TABS: 4.0000 mg | ORAL_TABLET | Freq: Four times a day (QID) | ORAL | Status: DC | PRN

## 2020-07-29 MED ORDER — SODIUM CHLORIDE 0.9 % IV SOLN
2.0000 g | INTRAVENOUS | Status: DC
  Administered 2020-07-30 – 2020-07-31 (×2): 2 g via INTRAVENOUS
  Filled 2020-07-29: qty 2
  Filled 2020-07-29: qty 20

## 2020-07-29 MED ORDER — NICOTINE 21 MG/24HR TD PT24
21.0000 mg | MEDICATED_PATCH | Freq: Every day | TRANSDERMAL | Status: DC
  Administered 2020-07-29 – 2020-07-31 (×3): 21 mg via TRANSDERMAL
  Filled 2020-07-29 (×3): qty 1

## 2020-07-29 MED ORDER — ADULT MULTIVITAMIN W/MINERALS CH
1.0000 | ORAL_TABLET | Freq: Every day | ORAL | Status: DC
  Administered 2020-07-30 – 2020-07-31 (×2): 1 via ORAL
  Filled 2020-07-29 (×2): qty 1

## 2020-07-29 MED ORDER — TRAZODONE HCL 50 MG PO TABS
150.0000 mg | ORAL_TABLET | Freq: Every day | ORAL | Status: DC
  Administered 2020-07-29 – 2020-07-30 (×2): 150 mg via ORAL
  Filled 2020-07-29 (×2): qty 1

## 2020-07-29 MED ORDER — MOMETASONE FURO-FORMOTEROL FUM 200-5 MCG/ACT IN AERO
2.0000 | INHALATION_SPRAY | Freq: Two times a day (BID) | RESPIRATORY_TRACT | Status: DC
  Administered 2020-07-29 – 2020-07-31 (×4): 2 via RESPIRATORY_TRACT
  Filled 2020-07-29: qty 8.8

## 2020-07-29 MED ORDER — ARIPIPRAZOLE 2 MG PO TABS
2.0000 mg | ORAL_TABLET | Freq: Every day | ORAL | Status: DC
  Administered 2020-07-29 – 2020-07-31 (×3): 2 mg via ORAL
  Filled 2020-07-29 (×3): qty 1

## 2020-07-29 MED ORDER — BUPROPION HCL ER (SR) 150 MG PO TB12
150.0000 mg | ORAL_TABLET | Freq: Two times a day (BID) | ORAL | Status: DC
  Administered 2020-07-29 – 2020-07-31 (×4): 150 mg via ORAL
  Filled 2020-07-29 (×5): qty 1

## 2020-07-29 MED ORDER — CLONAZEPAM 0.5 MG PO TABS
1.0000 mg | ORAL_TABLET | Freq: Two times a day (BID) | ORAL | Status: DC
  Administered 2020-07-29 – 2020-07-30 (×2): 1 mg via ORAL
  Filled 2020-07-29 (×3): qty 2

## 2020-07-29 MED ORDER — ALBUTEROL SULFATE (2.5 MG/3ML) 0.083% IN NEBU
5.0000 mg | INHALATION_SOLUTION | Freq: Once | RESPIRATORY_TRACT | Status: AC
  Administered 2020-07-29: 13:00:00 5 mg via RESPIRATORY_TRACT
  Filled 2020-07-29: qty 6

## 2020-07-29 MED ORDER — METHYLPREDNISOLONE SODIUM SUCC 125 MG IJ SOLR
125.0000 mg | Freq: Once | INTRAMUSCULAR | Status: AC
  Administered 2020-07-29: 13:00:00 125 mg via INTRAVENOUS
  Filled 2020-07-29: qty 2

## 2020-07-29 MED ORDER — PANTOPRAZOLE SODIUM 40 MG PO TBEC
40.0000 mg | DELAYED_RELEASE_TABLET | Freq: Every day | ORAL | Status: DC
  Administered 2020-07-29 – 2020-07-30 (×2): 40 mg via ORAL
  Filled 2020-07-29 (×2): qty 1

## 2020-07-29 MED ORDER — ASCORBIC ACID 500 MG PO TABS
250.0000 mg | ORAL_TABLET | Freq: Two times a day (BID) | ORAL | Status: DC
  Administered 2020-07-29 – 2020-07-31 (×4): 250 mg via ORAL
  Filled 2020-07-29 (×4): qty 1

## 2020-07-29 MED ORDER — SODIUM CHLORIDE 0.9 % IV SOLN
2.0000 g | INTRAVENOUS | Status: DC
  Administered 2020-07-29: 11:00:00 2 g via INTRAVENOUS
  Filled 2020-07-29: qty 20

## 2020-07-29 MED ORDER — IPRATROPIUM-ALBUTEROL 0.5-2.5 (3) MG/3ML IN SOLN: 3.0000 mL | Freq: Four times a day (QID) | RESPIRATORY_TRACT | Status: DC | PRN

## 2020-07-29 MED ORDER — ALBUTEROL SULFATE (2.5 MG/3ML) 0.083% IN NEBU
INHALATION_SOLUTION | RESPIRATORY_TRACT | Status: AC
  Filled 2020-07-29: qty 3

## 2020-07-29 NOTE — ED Provider Notes (Signed)
[facility] Emergency Department Provider Note ____________________________________________   Event Date/Time   First MD Initiated Contact with Patient 07/29/20 [phone]     (approximate)  I have reviewed the triage vital signs and the nursing notes.  HISTORY  Chief Complaint Shortness of Breath   HPI Mark [name] is a 64 y.o. malewho presents to the ED for evaluation of shortness of breath.   Chart review indicates hx copd.  Previous tonsillar cancer s/p surgery, chemo and radiation.  Patient reports getting his Covid booster about 2 weeks ago, reports feeling short of breath with increased sputum production since that time.  He denies documented fevers, but does report subjective chills intermittently.  Further reporting diffuse myalgias.  Reports some associated lightheaded dizziness and presyncope without syncope.   Past Medical History:  Diagnosis Date  . Arthritis    left hand  . Aspiration pneumonia ([name])   . Cancer ([place])    tonsilal ca  . COPD (chronic obstructive pulmonary disease) ([name])   . Incontinence    night time, began recently  . Memory deficit 11/18/2014  . PEG tube 11/18/2014   removed summer 2017  . Peripheral neuropathy 11/18/2014  . Smokers' cough ([name])   . Tonsillar cancer, S/P surgery, chemotherapy XRT 2009-followed at [facility] 11/18/2014  . Wears dentures    full upper and lower    Patient Active Problem List   Diagnosis Date Noted  . Dysphagia   . Palliative care by specialist   . Protein-calorie malnutrition, severe 09/10/2017  . Esophageal candidiasis ([name])   . Esophageal dysphagia   . Palliative care encounter   . Severe sepsis with septic shock ([place]) 04/06/2017  . Multifocal pneumonia 04/06/2017  . Lactic acidosis 04/06/2017  . Acute respiratory failure with hypoxia ([name]) 04/06/2017  . Septic shock ([name]) 04/06/2017  . Obstruction of esophagus   . 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  Bronchiectasis ([name]) 03/26/2016  . Stricture and stenosis of esophagus   . Gastrostomy status ([name])   . COPD (chronic obstructive pulmonary disease) ([name]) 12/19/2015  . Tobacco dependency 12/19/2015  . 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 Problems with swallowing and mastication   . Mechanical complication of gastrostomy ([name])   . Gastritis   . Excessive urination at night 05/30/2015  . Goals of care, counseling/discussion 05/30/2015  . FOM (frequency of micturition) 05/30/2015  . Sepsis ([place]) 04/23/2015  . Aspiration pneumonia ([place]) 11/18/2014  . Tonsillar cancer, S/P surgery, chemotherapy XRT 2009-followed at [facility] 11/18/2014  . Peripheral neuropathy 11/18/2014  . Adult failure to thrive 11/18/2014  . Recurrent aspiration pneumonia ([place]) 11/18/2014  . Memory deficit 11/18/2014  . PEG tube  11/18/2014    Past Surgical History:  Procedure Laterality Date  . APPENDECTOMY    . CHOLECYSTECTOMY  2007  . ESOPHAGEAL DILATION N/A 08/01/2016   Procedure: ESOPHAGEAL DILATION;  Surgeon: [name], MD;  Location: [place];  Service: Endoscopy;  Laterality: N/A;  . ESOPHAGEAL DILATION N/A 02/19/2017   Procedure: ESOPHAGEAL DILATION;  Surgeon: [name], MD;  Location: [place];  Service: Endoscopy;  Laterality: N/A;  . ESOPHAGEAL DILATION N/A 08/27/2017   Procedure: ESOPHAGEAL DILATION;  Surgeon: [name], MD;  Location: [place];  Service: Endoscopy;  Laterality: N/A;  . ESOPHAGEAL DILATION  04/26/2020   Procedure: ESOPHAGEAL DILATION;  Surgeon: [name], MD;  Location: [place];  Service: Endoscopy;;  . ESOPHAGOGASTRODUODENOSCOPY (EGD) WITH PROPOFOL N/A 01/24/2016   Procedure: ESOPHAGOGASTRODUODENOSCOPY (  EGD) WITH gastric biopsy and esophageal dilation.;  Surgeon: [name], MD;  Location: [place];  Service: Endoscopy;  Laterality: N/A;  . ESOPHAGOGASTRODUODENOSCOPY (EGD) WITH PROPOFOL N/A 08/01/2016   Procedure:  ESOPHAGOGASTRODUODENOSCOPY (EGD) WITH PROPOFOL;  Surgeon: [name], MD;  Location: [place];  Service: Endoscopy;  Laterality: N/A;  . ESOPHAGOGASTRODUODENOSCOPY (EGD) WITH PROPOFOL N/A 02/19/2017   Procedure: ESOPHAGOGASTRODUODENOSCOPY (EGD) WITH PROPOFOL;  Surgeon: [name], MD;  Location: [place];  Service: Endoscopy;  Laterality: N/A;  . ESOPHAGOGASTRODUODENOSCOPY (EGD) WITH PROPOFOL N/A 08/27/2017   Procedure: ESOPHAGOGASTRODUODENOSCOPY (EGD) WITH PROPOFOL;  Surgeon: [name], MD;  Location: [place];  Service: Endoscopy;  Laterality: N/A;  . ESOPHAGOGASTRODUODENOSCOPY (EGD) WITH PROPOFOL N/A 03/26/2018   Procedure: ESOPHAGOGASTRODUODENOSCOPY (EGD) WITH PROPOFOL;  Surgeon: Jonathon Bellows, MD;  Location: [name] ENDOSCOPY;  Service: Gastroenterology;  Laterality: N/A;  . ESOPHAGOGASTRODUODENOSCOPY (EGD) WITH PROPOFOL N/A 04/22/2018   Procedure: ESOPHAGOGASTRODUODENOSCOPY (EGD) WITH PROPOFOL;  Surgeon: [name], MD;  Location: [place];  Service: Endoscopy;  Laterality: N/A;  . ESOPHAGOGASTRODUODENOSCOPY (EGD) WITH PROPOFOL N/A 04/26/2020   Procedure: ESOPHAGOGASTRODUODENOSCOPY (EGD) WITH PROPOFOL;  Surgeon: [name], MD;  Location: [name];  Service: Endoscopy;  Laterality: N/A;  . ESOPHAGOSCOPY WITH DILITATION N/A 04/22/2018   Procedure: ESOPHAGOSCOPY WITH DILITATION;  Surgeon: [name], MD;  Location: [place];  Service: Endoscopy;  Laterality: N/A;  . PEG PLACEMENT N/A 11/28/2015   Procedure: PERCUTANEOUS ENDOSCOPIC GASTROSTOMY (PEG) PLACEMENT;  Surgeon: [name], MD;  Location: [place];  Service: Endoscopy;  Laterality: N/A;  . PEG TUBE PLACEMENT  10/22/12   10/22/12 - [facility], 10/23/14 - [facility], Dr. Allen Norris, replaced  . 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 Authorizing Provider  albuterol (PROVENTIL HFA;VENTOLIN HFA) 108 (90 BASE)  MCG/ACT inhaler Inhale 2 puffs into the lungs every 6 (six) hours as needed for wheezing or shortness of breath.     [provider]  ARIPiprazole (ABILIFY) 2 MG tablet Take 2 mg by mouth daily.     [provider]  aspirin EC 81 MG tablet 81 mg.    [provider]  budesonide-formoterol (SYMBICORT) 160-4.5 MCG/ACT inhaler Inhale into the lungs.    [provider]  buPROPion (WELLBUTRIN SR) 150 MG 12 hr tablet 150 mg.    [provider]  clonazePAM (KLONOPIN) 1 MG tablet Take 1 mg by mouth 2 (two) times daily.     [provider]  DULoxetine (CYMBALTA) 60 MG capsule Take 60 mg by mouth daily. Patient not taking: Reported on 04/26/2020    [provider]  Ipratropium-Albuterol (COMBIVENT) 20-100 MCG/ACT AERS respimat Inhale 2 puffs into the lungs 2 (two) times daily.    [provider]  mometasone (NASONEX) 50 MCG/ACT nasal spray Place 2 sprays into both nostrils 2 (two) times daily. Patient not taking: Reported on 04/26/2020    [provider]  Multiple Vitamin (MULTIVITAMIN WITH MINERALS) TABS tablet Take 1 tablet by mouth daily. 05/05/18   Loletha Grayer, MD  Nutritional Supplements (FEEDING SUPPLEMENT, NEPRO CARB STEADY,) LIQD Take 237 mLs by mouth 3 (three) times daily between meals. 05/05/18   Loletha Grayer, MD  Oxycodone HCl 20 MG TABS Take 20 mg by mouth 4 (four) times daily.     [provider]  pantoprazole (PROTONIX) 40 MG tablet Take 40 mg by mouth at bedtime.     [provider]  pregabalin (LYRICA) 150 MG capsule Take 1 capsule by mouth 2 (two) times daily. 04/13/18   [provider]  traZODone (DESYREL) 150 MG tablet Take 150 mg by mouth at bedtime.     [provider]  venlafaxine (EFFEXOR) 37.5 MG tablet Take 37.5 mg by mouth 2 (two) times daily with a meal.     [provider]  vitamin C (VITAMIN C) 250 MG tablet Take 1 tablet (250 mg total) by mouth 2 (two)  times daily. 05/05/18   Loletha Grayer, MD    Allergies Morphine and related  Family History  Problem Relation Age of Onset  . Pancreatic cancer Mother   . Stroke Father   . Testicular cancer Father   . Suicidality Father     Social History Social History   Tobacco Use  . Smoking status: Current Every Day Smoker    Years: 40.00  . Smokeless tobacco: Never Used  . Tobacco comment: was 2 PPD, now 2 cigs/day  Vaping Use  . Vaping Use: Never used  Substance Use Topics  . Alcohol use: No  . Drug use: No    Review of Systems  Constitutional: Positive for generalized weakness and fever/chills Eyes: No visual changes. ENT: No sore throat. Cardiovascular: Denies chest pain. Respiratory: Positive for productive cough and shortness of breath. Gastrointestinal: No abdominal pain.  No nausea, no vomiting.  No diarrhea.  No constipation. Genitourinary: Negative for dysuria. Musculoskeletal: Negative for back pain. Skin: Negative for rash. Neurological: Negative for headaches, focal weakness or numbness.  ____________________________________________   PHYSICAL EXAM:  VITAL SIGNS: Vitals:   07/29/20 1045 07/29/20 1233  BP: (!) 141/83 126/78  Pulse: 95 68  Resp: (!) 22 (!) 24  Temp:    SpO2: 98% 96%     Constitutional: Alert and oriented. Well appearing and in no acute distress.  Sitting up in bed, looks well and making jokes.  Conversational in full sentences. Eyes: Conjunctivae are normal. PERRL. EOMI. Head: Atraumatic. Nose: No congestion/rhinnorhea. Mouth/Throat: Mucous membranes are moist.  Oropharynx non-erythematous. Neck: No stridor. No cervical spine tenderness to palpation. Cardiovascular: Tachycardic rate, regular rhythm. Grossly normal heart sounds.  Good peripheral circulation. Respiratory: Mild tachypnea in the low 20s, no further evidence of distress.  Moderate air movement throughout with diffuse expiratory wheezes Gastrointestinal: Soft ,  nondistended, nontender to palpation. No CVA tenderness. Musculoskeletal: No lower extremity tenderness nor edema.  No joint effusions. No signs of acute trauma. Neurologic:  Normal speech and language. No gross focal neurologic deficits are appreciated. No gait instability noted. Skin:  Skin is warm, dry and intact. No rash noted. Psychiatric: Mood and affect are normal. Speech and behavior are normal.  ____________________________________________   LABS (all labs ordered are listed, but only abnormal results are displayed)  Labs Reviewed  CBC WITH DIFFERENTIAL/PLATELET - Abnormal; Notable for the following components:      Result Value   WBC 21.2 (*)    RBC 4.08 (*)    Hemoglobin 12.4 (*)    HCT 38.1 (*)    Neutro Abs 19.3 (*)    Abs Immature Granulocytes 0.10 (*)    All other components within normal limits  COMPREHENSIVE METABOLIC PANEL - Abnormal; Notable for the following components:   Glucose, Bld 216 (*)    Calcium 8.7 (*)    All other components within normal limits  BRAIN NATRIURETIC PEPTIDE - Abnormal; Notable for the following components:   B Natriuretic Peptide 216.1 (*)  All other components within normal limits  RESP PANEL BY RT-PCR (FLU A&B, COVID) ARPGX2  CULTURE, BLOOD (ROUTINE X 2)  CULTURE, BLOOD (ROUTINE X 2)  MAGNESIUM  LACTIC ACID, PLASMA  URINALYSIS, COMPLETE (UACMP) WITH MICROSCOPIC  HIV ANTIBODY (ROUTINE TESTING W REFLEX)  TROPONIN I (HIGH SENSITIVITY)  TROPONIN I (HIGH SENSITIVITY)   ____________________________________________  12 Lead EKG  Sinus rhythm, rate of 105 bpm.  Normal axis.  Normal intervals.  Wandering baseline, but no STEMI criteria or clear evidence of ischemia ____________________________________________  RADIOLOGY  ED MD interpretation: 2 view CXR reviewed by me with diffuse reticular densities throughout his lungs, with evidence of possible superimposed infiltrate to his left base  Official radiology report(s): DG Chest  2 View  Result Date: 07/29/2020 CLINICAL DATA:  Shortness of breath. EXAM: CHEST - 2 VIEW COMPARISON:  May 03, 2018. FINDINGS: The heart size and mediastinal contours are within normal limits. Stable diffuse coarse reticular densities are noted throughout the right lung most consistent with chronic interstitial lung disease or scarring. Increased coarse interstitial densities are noted in the left lung which may represent scarring or chronic interstitial lung disease, but acute superimposed inflammation cannot be excluded. No pneumothorax is noted. Blunting of the left costophrenic sulcus is noted and unchanged most consistent with scarring. The visualized skeletal structures are unremarkable. IMPRESSION: Stable diffuse coarse reticular densities are noted throughout the right lung most consistent with chronic interstitial lung disease or scarring. Increased coarse interstitial densities are noted in the left lung which may represent scarring or chronic interstitial lung disease, but acute superimposed inflammation cannot be excluded. Electronically Signed   By: Marijo Conception M.D.   On: 07/29/2020 09:42    ____________________________________________   PROCEDURES and INTERVENTIONS  Procedure(s) performed (including Critical Care):  .1-3 Lead EKG Interpretation Performed by: Vladimir Crofts, MD Authorized by: Vladimir Crofts, MD     Interpretation: abnormal     ECG rate:  104   ECG rate assessment: tachycardic     Rhythm: sinus tachycardia     Ectopy: none     Conduction: normal      Medications  lactated ringers infusion (has no administration in time range)  methylPREDNISolone sodium succinate (SOLU-MEDROL) 125 mg/2 mL injection 125 mg (has no administration in time range)  aspirin EC tablet 81 mg (has no administration in time range)  oxyCODONE (Oxy IR/ROXICODONE) immediate release tablet 20 mg (has no administration in time range)  ARIPiprazole (ABILIFY) tablet 2 mg (has no  administration in time range)  buPROPion (WELLBUTRIN SR) 12 hr tablet 150 mg (has no administration in time range)  traZODone (DESYREL) tablet 150 mg (has no administration in time range)  venlafaxine (EFFEXOR) tablet 37.5 mg (has no administration in time range)  pantoprazole (PROTONIX) EC tablet 40 mg (has no administration in time range)  clonazePAM (KLONOPIN) tablet 1 mg (has no administration in time range)  pregabalin (LYRICA) capsule 150 mg (has no administration in time range)  multivitamin with minerals tablet 1 tablet (has no administration in time range)  feeding supplement (NEPRO CARB STEADY) liquid 237 mL (has no administration in time range)  ascorbic acid (VITAMIN C) tablet 250 mg (has no administration in time range)  mometasone-formoterol (DULERA) 200-5 MCG/ACT inhaler 2 puff (has no administration in time range)  ipratropium-albuterol (DUONEB) 0.5-2.5 (3) MG/3ML nebulizer solution 3 mL (has no administration in time range)  enoxaparin (LOVENOX) injection 40 mg (has no administration in time range)  0.9 %  sodium chloride infusion (has  no administration in time range)  cefTRIAXone (ROCEPHIN) 2 g in sodium chloride 0.9 % 100 mL IVPB (has no administration in time range)  azithromycin (ZITHROMAX) 500 mg in sodium chloride 0.9 % 250 mL IVPB (has no administration in time range)  acetaminophen (TYLENOL) tablet 650 mg (has no administration in time range)    Or  acetaminophen (TYLENOL) suppository 650 mg (has no administration in time range)  ondansetron (ZOFRAN) tablet 4 mg (has no administration in time range)    Or  ondansetron (ZOFRAN) injection 4 mg (has no administration in time range)  lactated ringers bolus 1,088 mL (0 mL/kg  54.4 kg Intravenous Stopped 07/29/20 1229)  acetaminophen (TYLENOL) tablet 1,000 mg (1,000 mg Oral Given 07/29/20 1046)  albuterol (PROVENTIL) (2.5 MG/3ML) 0.083% nebulizer solution 5 mg (5 mg Nebulization Given 07/29/20 1233)  ipratropium  (ATROVENT) nebulizer solution 0.5 mg (0.5 mg Nebulization Given 07/29/20 1233)    ____________________________________________   MDM / ED COURSE   64 year old male with a history of COPD presents from home with evidence of sepsis attributed to pneumonia requiring medical admission.  Patient tachycardic on arrival, resolving after IVF, and does require 2 L O2 for normoxia, otherwise hemodynamically stable.  Exam with evidence of a COPD exacerbation with diffuse expiratory wheezes and only moderate air movement throughout.  He looks well considering this, conversational in full sentences and making some jokes.  No further evidence of acute pathology beyond his respiratory status.  Blood work with leukocytosis, but no lactic acidosis.  Blood cultures were drawn patient was provided CAP coverage with Rocephin and azithromycin.  Provided steroids and albuterol due to shortness of breath and evidence of COPD exacerbation.  We will admit the patient for further work-up and management of his septic condition.     ____________________________________________   FINAL CLINICAL IMPRESSION(S) / ED DIAGNOSES  Final diagnoses:  COPD exacerbation ([place])  SOB (shortness of breath)  Sepsis with acute hypoxic respiratory failure without septic shock, due to unspecified organism [facility])     ED Discharge Orders    None       Jahan Friedlander   Note:  This document was prepared using Dragon voice recognition software and may include unintentional dictation errors.   Vladimir Crofts, MD 07/29/20 1240

## 2020-07-29 NOTE — ED Notes (Signed)
Pharmacy to verify medications

## 2020-07-29 NOTE — Sepsis Progress Note (Signed)
Sepsis protocol is being monitored by [facility]. 

## 2020-07-29 NOTE — ED Triage Notes (Signed)
Pt to ED via POV stating that he has had shortness of breath x 2 weeks. Pt reports that symptoms started after getting his booster shot. Pt has congested sounding cough. Pt states that he also has body aches. Pt states that he has been having near syncopal episodes as well. Pt SpO2 levels low in triage. Pt placed on oxygen

## 2020-07-29 NOTE — H&P (Signed)
History and Physical    ELIJA MCCAMISH FIE:[id number] DOB: 1955-12-10 DOA: 07/29/2020  PCP: Secundino Ginger, PA-C   Patient coming from: Home  I have personally briefly reviewed patient's old medical records in [place]  Chief Complaint: Shortness of breath  HPI: ONTARIO PETTENGILL is a 64 y.o. 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  Patient states that he got his COVID-19 booster shot about 2 weeks ago and since then has not felt well.  He has a cough productive of occasional yellow phlegm associated with worsening shortness of breath, wheezing, chills, myalgia and weakness.  In triage he was noted to be hypoxic in the low 80s and required oxygen supplementation at 2 L to maintain pulse oximetry greater than 92%. He denies having any chest pain, no nausea, no vomiting, no abdominal pain, no changes in his bowel habits, no urinary symptoms. Labs show sodium 138, potassium 3.7, chloride 98, bicarb 30, glucose 216, BUN 12, creatinine 0.7, magnesium 2.0, alkaline phosphatase 84, albumin 3.7, AST 28, ALT 16, total protein 7.5, BNP 216, troponin VIII, lactic acid 1.2, white count 21.2, hemoglobin 12.4, hematocrit 38.1, MCV 93.4, RDW 13.7, platelet count 228 Respiratory viral panel is negative Chest x-ray reviewed by me shows stable diffuse coarse reticular densities are noted throughout the right lung most consistent with chronic interstitial lung disease or scarring. Increased coarse interstitial densities are noted in the left lung which may represent scarring or chronic interstitial lung disease, but acute superimposed inflammation cannot be excluded. Twelve-lead EKG reviewed by me shows sinus tachycardia  ED Course:  Patient is a 64 year old Caucasian male who presents to the ER for evaluation of worsening shortness of breath from his baseline associated with a cough productive of yellow phlegm, chills, myalgias and weakness.  He was tachycardic and tachypneic upon arrival to the ER and was hypoxic with room air pulse oximetry in the low 80s that improved with oxygen supplementation at 2 L.  He has marked leukocytosis of 21K. Chest x-ray shows findings consistent with pneumonia.  Lactic acid is within normal limits. He will be admitted to the hospital for further evaluation.  Review of Systems: As per HPI otherwise 10 point review of systems negative.    Past Medical History:  Diagnosis Date  . Arthritis    left hand  . Aspiration pneumonia ([place])   . Cancer ([place])    tonsilal ca  . COPD (chronic obstructive pulmonary disease) ([place])   . Incontinence    night time, began recently  . Memory deficit 11/18/2014  . PEG tube 11/18/2014   removed summer 2017  . Peripheral neuropathy 11/18/2014  . Smokers' cough ([place])   . Tonsillar cancer, S/P surgery, chemotherapy XRT 2009-[facility]ed at [facility] 11/18/2014  . Wears dentures    full upper and lower    Past Surgical History:  Procedure Laterality Date  . APPENDECTOMY    . CHOLECYSTECTOMY  2007  . ESOPHAGEAL DILATION N/A 08/01/2016   Procedure: ESOPHAGEAL DILATION;  Surgeon: [name], MD;  Location: [place];  Service: Endoscopy;  Laterality: N/A;  . ESOPHAGEAL DILATION N/A 02/19/2017   Procedure: ESOPHAGEAL DILATION;  Surgeon: [name], MD;  Location: [place];  Service: Endoscopy;  Laterality: N/A;  .  ESOPHAGEAL DILATION N/A 08/27/2017   Procedure: ESOPHAGEAL DILATION;  Surgeon: [name], MD;  Location: [place];  Service: Endoscopy;  Laterality: N/A;  . ESOPHAGEAL DILATION  04/26/2020   Procedure: ESOPHAGEAL DILATION;  Surgeon: [name], MD;  Location: [place];  Service: Endoscopy;;  .  ESOPHAGOGASTRODUODENOSCOPY (EGD) WITH PROPOFOL N/A 01/24/2016   Procedure: ESOPHAGOGASTRODUODENOSCOPY (EGD) WITH gastric biopsy and esophageal dilation.;  Surgeon: [name], MD;  Location: [place];  Service: Endoscopy;  Laterality: N/A;  . ESOPHAGOGASTRODUODENOSCOPY (EGD) WITH PROPOFOL N/A 08/01/2016   Procedure: ESOPHAGOGASTRODUODENOSCOPY (EGD) WITH PROPOFOL;  Surgeon: [name], MD;  Location: [place];  Service: Endoscopy;  Laterality: N/A;  . ESOPHAGOGASTRODUODENOSCOPY (EGD) WITH PROPOFOL N/A 02/19/2017   Procedure: ESOPHAGOGASTRODUODENOSCOPY (EGD) WITH PROPOFOL;  Surgeon: [name], MD;  Location: [place];  Service: Endoscopy;  Laterality: N/A;  . ESOPHAGOGASTRODUODENOSCOPY (EGD) WITH PROPOFOL N/A 08/27/2017   Procedure: ESOPHAGOGASTRODUODENOSCOPY (EGD) WITH PROPOFOL;  Surgeon: [name], MD;  Location: [place];  Service: Endoscopy;  Laterality: N/A;  . ESOPHAGOGASTRODUODENOSCOPY (EGD) WITH PROPOFOL N/A 03/26/2018   Procedure: ESOPHAGOGASTRODUODENOSCOPY (EGD) WITH PROPOFOL;  Surgeon: Jonathon Bellows, MD;  [facility] ENDOSCOPY;  Service: Gastroenterology;  Laterality: N/A;  . ESOPHAGOGASTRODUODENOSCOPY (EGD) WITH PROPOFOL N/A 04/22/2018   Procedure: ESOPHAGOGASTRODUODENOSCOPY (EGD) WITH PROPOFOL;  Surgeon: [name], MD;  Location: [place];  Service: Endoscopy;  Laterality: N/A;  . ESOPHAGOGASTRODUODENOSCOPY (EGD) WITH PROPOFOL N/A 04/26/2020   Procedure: ESOPHAGOGASTRODUODENOSCOPY (EGD) WITH PROPOFOL;  Surgeon: [name], MD;  Location: [place];  Service: Endoscopy;  Laterality: N/A;  . ESOPHAGOSCOPY WITH DILITATION N/A 04/22/2018   Procedure: ESOPHAGOSCOPY WITH DILITATION;  Surgeon: [name], MD;  Location: [place];  Service: Endoscopy;  Laterality: N/A;  . PEG PLACEMENT N/A 11/28/2015   Procedure: PERCUTANEOUS ENDOSCOPIC GASTROSTOMY (PEG) PLACEMENT;  Surgeon: [name], MD;  Location: [place];  Service: Endoscopy;  Laterality: N/A;  . PEG TUBE PLACEMENT  10/22/12   10/22/12 - [facility], 10/23/14 - [facility], Dr. Allen Norris, replaced  . TONSILLECTOMY     jan 2010--stage IV     reports that he has been smoking. He has smoked for the past 40.00 years. He has never used smokeless tobacco. He reports that he does not drink alcohol and does not use drugs.  Allergies  Allergen Reactions  . Morphine And Related Nausea And Vomiting    Family History  Problem Relation Age of Onset  . Pancreatic cancer Mother   . Stroke Father   . Testicular cancer Father   . Suicidality Father      Prior to Admission medications   Medication Sig Start Date End Date Taking? Authorizing Provider  albuterol (PROVENTIL HFA;VENTOLIN HFA) 108 (90 BASE) MCG/ACT inhaler Inhale 2 puffs into the lungs every 6 (six) hours as needed for wheezing or shortness of breath.     [provider]  ARIPiprazole (ABILIFY) 2 MG tablet Take 2 mg by mouth daily.     [provider]  aspirin EC 81 MG tablet 81 mg.    [provider]  budesonide-formoterol (SYMBICORT) 160-4.5 MCG/ACT inhaler Inhale into the lungs.    [provider]  buPROPion (WELLBUTRIN SR) 150 MG 12 hr tablet 150 mg.    [provider]  clonazePAM (KLONOPIN) 1 MG tablet Take 1 mg by mouth 2 (two) times daily.     [provider]  DULoxetine (CYMBALTA) 60 MG capsule Take 60 mg by mouth daily. Patient not taking: Reported on  04/26/2020    [provider]  Ipratropium-Albuterol (COMBIVENT) 20-100 MCG/ACT AERS respimat Inhale 2 puffs into the lungs 2 (two) times daily.    [provider]  mometasone (NASONEX) 50 MCG/ACT nasal spray Place 2 sprays into both nostrils 2 (two) times daily. Patient not taking: Reported on 04/26/2020    [provider]  Multiple Vitamin (MULTIVITAMIN WITH MINERALS) TABS tablet Take 1 tablet by mouth daily. 05/05/18   Loletha Grayer, MD  Nutritional Supplements  (FEEDING SUPPLEMENT, NEPRO CARB STEADY,) LIQD Take 237 mLs by mouth 3 (three) times daily between meals. 05/05/18   Loletha Grayer, MD  Oxycodone HCl 20 MG TABS Take 20 mg by mouth 4 (four) times daily.     [provider]  pantoprazole (PROTONIX) 40 MG tablet Take 40 mg by mouth at bedtime.     [provider]  pregabalin (LYRICA) 150 MG capsule Take 1 capsule by mouth 2 (two) times daily. 04/13/18   [provider]  traZODone (DESYREL) 150 MG tablet Take 150 mg by mouth at bedtime.     [provider]  venlafaxine (EFFEXOR) 37.5 MG tablet Take 37.5 mg by mouth 2 (two) times daily with a meal.     [provider]  vitamin C (VITAMIN C) 250 MG tablet Take 1 tablet (250 mg total) by mouth 2 (two) times daily. 05/05/18   Loletha Grayer, MD    Physical Exam: Vitals:   07/29/20 0859 07/29/20 0943 07/29/20 1045 07/29/20 1233  BP:  127/82 (!) 141/83 126/78  Pulse:  100 95 68  Resp:  (!) 22 (!) 22 (!) 24  Temp: 99.3 F (37.4 C)     SpO2:  100% 98% 96%  Weight:      Height:         Vitals:   07/29/20 0859 07/29/20 0943 07/29/20 1045 07/29/20 1233  BP:  127/82 (!) 141/83 126/78  Pulse:  100 95 68  Resp:  (!) 22 (!) 22 (!) 24  Temp: 99.3 F (37.4 C)     SpO2:  100% 98% 96%  Weight:      Height:        Constitutional: NAD, alert and oriented x 3.  Chronically ill-appearing.  Looks older than stated age.  Thin and frail Eyes: PERRL, lids and conjunctivae pallor ENMT: Mucous membranes are moist.  Neck: normal, supple, no masses, no thyromegaly Respiratory: Scattered rhonchi and wheezing bilaterally, no crackles.  Tachypneic  Cardiovascular: Tachycardic, no murmurs / rubs / gallops. No extremity edema. 2+ pedal pulses. No carotid bruits.  Abdomen: no tenderness, no masses palpated. No hepatosplenomegaly. Bowel sounds positive.  Musculoskeletal: no clubbing / cyanosis. No joint deformity upper and lower extremities.  Skin: no rashes, lesions,  ulcers.  Neurologic: No gross focal neurologic deficit.  Generalized weakness Psychiatric: Normal mood and affect.   Labs on Admission: I have personally reviewed following labs and imaging studies  CBC: Recent Labs  Lab 07/29/20 0900  WBC 21.2*  NEUTROABS 19.3*  HGB 12.4*  HCT 38.1*  MCV 93.4  PLT 010   Basic Metabolic Panel: Recent Labs  Lab 07/29/20 0900 07/29/20 0946  NA 138  --   K 3.7  --   CL 98  --   CO2 30  --   GLUCOSE 216*  --   BUN 12  --   CREATININE 0.77  --   CALCIUM 8.7*  --   MG  --  2.0   GFR: Estimated Creatinine Clearance: 71.8  mL/min (by C-G formula based on SCr of 0.77 mg/dL). Liver Function Tests: Recent Labs  Lab 07/29/20 0900  AST 28  ALT 16  ALKPHOS 84  BILITOT 0.6  PROT 7.5  ALBUMIN 3.7   No results for input(s): LIPASE, AMYLASE in the last 168 hours. No results for input(s): AMMONIA in the last 168 hours. Coagulation Profile: No results for input(s): INR, PROTIME in the last 168 hours. Cardiac Enzymes: No results for input(s): CKTOTAL, CKMB, CKMBINDEX, TROPONINI in the last 168 hours. BNP (last 3 results) No results for input(s): PROBNP in the last 8760 hours. HbA1C: No results for input(s): HGBA1C in the last 72 hours. CBG: No results for input(s): GLUCAP in the last 168 hours. Lipid Profile: No results for input(s): CHOL, HDL, LDLCALC, TRIG, CHOLHDL, LDLDIRECT in the last 72 hours. Thyroid Function Tests: No results for input(s): TSH, T4TOTAL, FREET4, T3FREE, THYROIDAB in the last 72 hours. Anemia Panel: No results for input(s): VITAMINB12, FOLATE, FERRITIN, TIBC, IRON, RETICCTPCT in the last 72 hours. Urine analysis:    Component Value Date/Time   COLORURINE YELLOW (A) 05/03/2018 1619   APPEARANCEUR CLEAR (A) 05/03/2018 1619   APPEARANCEUR Clear 11/18/2014 1402   LABSPEC 1.015 05/03/2018 1619   LABSPEC 1.013 11/18/2014 1402   PHURINE 6.0 05/03/2018 1619   GLUCOSEU NEGATIVE 05/03/2018 1619   GLUCOSEU 150 mg/dL  11/18/2014 1402   HGBUR NEGATIVE 05/03/2018 1619   BILIRUBINUR NEGATIVE 05/03/2018 1619   BILIRUBINUR Negative 11/18/2014 1402   KETONESUR NEGATIVE 05/03/2018 1619   PROTEINUR NEGATIVE 05/03/2018 1619   UROBILINOGEN 0.2 01/03/2010 0253   NITRITE NEGATIVE 05/03/2018 1619   LEUKOCYTESUR NEGATIVE 05/03/2018 1619   LEUKOCYTESUR Negative 11/18/2014 1402    Radiological Exams on Admission: DG Chest 2 View  Result Date: 07/29/2020 CLINICAL DATA:  Shortness of breath. EXAM: CHEST - 2 VIEW COMPARISON:  May 03, 2018. FINDINGS: The heart size and mediastinal contours are within normal limits. Stable diffuse coarse reticular densities are noted throughout the right lung most consistent with chronic interstitial lung disease or scarring. Increased coarse interstitial densities are noted in the left lung which may represent scarring or chronic interstitial lung disease, but acute superimposed inflammation cannot be excluded. No pneumothorax is noted. Blunting of the left costophrenic sulcus is noted and unchanged most consistent with scarring. The visualized skeletal structures are unremarkable. IMPRESSION: Stable diffuse coarse reticular densities are noted throughout the right lung most consistent with chronic interstitial lung disease or scarring. Increased coarse interstitial densities are noted in the left lung which may represent scarring or chronic interstitial lung disease, but acute superimposed inflammation cannot be excluded. Electronically Signed   By: Marijo Conception M.D.   On: 07/29/2020 09:42    EKG: Independently reviewed.  Sinus tachycardia  Assessment/Plan Active Problems:   Sepsis (HCC)   COPD (chronic obstructive pulmonary disease) (HCC)   Multifocal pneumonia   Acute respiratory failure with hypoxia (HCC)   Esophageal dysphagia   Chronic respiratory failure (HCC)   Sepsis (PO[facility]) As evidenced by tachycardia, tachypnea, leukocytosis with a left shift and chest x-ray findings  suggestive of pneumonia with associated worsening hypoxia. Sepsis appears to be secondary to multifocal pneumonia Continue sepsis IV fluids We will place patient empirically on IV Rocephin and Zithromax Follow-up results of blood cultures   Acute on chronic respiratory failure Patient has a history of COPD and is on 2 L of oxygen only at bedtime and as needed during the day Upon arrival to the ER he was noted  to be tachypneic and hypoxic with room air pulse oximetry in the low 80s and is currently on 2 L of oxygen to maintain pulse oximetry greater than 92% Worsening respiratory failure appears to be secondary to community-acquired pneumonia Patient will need to be reassessed for changes in his oxygen need prior to discharge.     History of esophageal dysphagia Continue dysphagia 2 diet with nectar thick liquids    COPD with chronic respiratory failure Not acutely exacerbated Continue as needed bronchodilator therapy as well as inhaled steroids Continue oxygen supplementation to maintain pulse oximetry greater than 92%    Depression and anxiety Continue bupropion, trazodone, Effexor clonazepam and Abilify   DVT prophylaxis: Lovenox Code Status: Full code Family Communication: Greater than 50% of time was spent discussing patient's condition and plan of care with him at the bedside. All questions and concerns have been addressed. He verbalizes understanding and agrees with the plan. Disposition Plan: Back to previous home environment Consults called: None    Tyshawna Alarid MD [facility]     07/29/2020, 12:46 PM

## 2020-07-29 NOTE — Progress Notes (Signed)
CODE SEPSIS - PHARMACY COMMUNICATION  **Broad Spectrum Antibiotics should be administered within 1 hour of Sepsis diagnosis**  Time Code Sepsis Called/Page Received: 1006 - previous consult  Antibiotics Ordered: ceftraixone/azithromycin  Time of 1st antibiotic administration: 1046  Additional action taken by pharmacy:   If necessary, Name of Provider/Nurse Contacted:     Rocky Morel ,PharmD Clinical Pharmacist  07/29/2020  12:42 PM

## 2020-07-30 ENCOUNTER — Encounter: Payer: Self-pay | Admitting: Internal Medicine

## 2020-07-30 DIAGNOSIS — J189 Pneumonia, unspecified organism: Secondary | ICD-10-CM

## 2020-07-30 DIAGNOSIS — J9601 Acute respiratory failure with hypoxia: Secondary | ICD-10-CM

## 2020-07-30 DIAGNOSIS — R6520 Severe sepsis without septic shock: Secondary | ICD-10-CM

## 2020-07-30 DIAGNOSIS — J9611 Chronic respiratory failure with hypoxia: Secondary | ICD-10-CM

## 2020-07-30 DIAGNOSIS — A419 Sepsis, unspecified organism: Principal | ICD-10-CM

## 2020-07-30 LAB — BASIC METABOLIC PANEL
Anion gap: 8 (ref 5–15)
BUN: 12 mg/dL (ref 8–23)
CO2: 31 mmol/L (ref 22–32)
Calcium: 8.5 mg/dL — ABNORMAL LOW (ref 8.9–10.3)
Chloride: 99 mmol/L (ref 98–111)
Creatinine, Ser: 0.67 mg/dL (ref 0.61–1.24)
GFR, Estimated: >60 mL/min (ref >60)
Glucose, Bld: 182 mg/dL — ABNORMAL HIGH (ref 70–99)
Potassium: 4.1 mmol/L (ref 3.5–5.1)
Sodium: 138 mmol/L (ref 135–145)

## 2020-07-30 LAB — CORTISOL-AM, BLOOD: Cortisol - AM: 3.8 ug/dL — ABNORMAL LOW (ref 6.7–22.6)

## 2020-07-30 LAB — PROTIME-INR
INR: 1.2 (ref 0.8–1.2)
Prothrombin Time: 14.4 seconds (ref 11.4–15.2)

## 2020-07-30 LAB — CBC
HCT: 30.8 % — ABNORMAL LOW (ref 39.0–52.0)
Hemoglobin: 10.1 g/dL — ABNORMAL LOW (ref 13.0–17.0)
MCH: 30.3 pg (ref 26.0–34.0)
MCHC: 32.8 g/dL (ref 30.0–36.0)
MCV: 92.5 fL (ref 80.0–100.0)
Platelets: 178 10*3/uL (ref 150–400)
RBC: 3.33 MIL/uL — ABNORMAL LOW (ref 4.22–5.81)
RDW: 13.2 % (ref 11.5–15.5)
WBC: 8.5 10*3/uL (ref 4.0–10.5)
nRBC: 0 % (ref 0.0–0.2)

## 2020-07-30 LAB — PROCALCITONIN: Procalcitonin: 0.2 ng/mL

## 2020-07-30 MED ORDER — AZITHROMYCIN 500 MG PO TABS
500.0000 mg | ORAL_TABLET | Freq: Every day | ORAL | Status: AC
  Administered 2020-07-31: 08:00:00 500 mg via ORAL
  Filled 2020-07-30: qty 1

## 2020-07-30 MED ORDER — MORPHINE SULFATE (PF) 2 MG/ML IV SOLN: 2.0000 mg | INTRAVENOUS | Status: DC | PRN

## 2020-07-30 NOTE — Progress Notes (Signed)
Initial Nutrition Assessment  DOCUMENTATION CODES:   Severe malnutrition in context of chronic illness,Underweight  INTERVENTION:  Continue Nepro Shake po TID, each supplement provides 425 kcal and 19 grams protein.  Provide Magic cup BID with lunch and dinner, each supplement provides 290 kcal and 9 grams of protein. Patient prefers chocolate.  Continue MVI po daily.  Encouraged adequate intake of calories and protein at meals. Discussed which foods patient may tolerate best.  Monitor magnesium, potassium, and phosphorus daily for at least 3 days, MD to replete as needed, as pt is at risk for refeeding syndrome given severe malnutrition.  NUTRITION DIAGNOSIS:   Severe Malnutrition related to chronic illness (COPD, tonsillar cancer s/p tonsillectomy and chemo/XRT, dysphagia) as evidenced by severe fat depletion,severe muscle depletion.  GOAL:   Patient will meet greater than or equal to 90% of their needs  MONITOR:   PO intake,Supplement acceptance,Labs,Weight trends,I & O's  REASON FOR ASSESSMENT:   Consult Assessment of nutrition requirement/status  ASSESSMENT:   64 year old male with PMHx of COPD, tonsillar cancer s/p tonsillectomy and chemo/XRT, arthritis, hx PEG tube placed 11/18/2014 s/p removal in 2017, esophageal dysphagia admitted with sepsis, multifocal PNA.   Met with patient at bedside. He reports his appetite is good. He endorses baseline dysphagia on dysphagia 2 diet (occasionally dysphagia 3) with nectar-thick liquids at baseline. He reports at home he tries to eat 3 meals per day and focuses on high-calorie, high-protein foods. He enjoys eggs and grits at breakfast. At other meals he enjoys fish or chopped meats he can tolerate. Patient ate 100% of breakfast this morning but only had bites/sips of lunch. He reports he is drinking Nepro and tolerating well. He is amenable to also trying [employer]. He also has oral nutrition supplements at home he drinks. He has  ThickenUp powder at home to thicken beverages. Patient used to have a G-tube for tube feeding. He reports he would never want a feeding tube again (short-term or long-term).  Patient reports his UBW was 180 lbs for most of his adult life. He has now been stable at 120 lbs for a while now per his report. Patient currently documented to be 54.4 kg (120 lbs).  Medications reviewed and include: vitamin C 250 mg BID, azithromycin, MVI daily, Protonix, ceftriaxone, NS at 125 ml/hr.  Labs reviewed.  NUTRITION - FOCUSED PHYSICAL EXAM:  Flowsheet Row Most Recent Value  Orbital Region Severe depletion  Upper Arm Region Severe depletion  Thoracic and Lumbar Region Severe depletion  Buccal Region Severe depletion  Temple Region Severe depletion  Clavicle Bone Region Severe depletion  Clavicle and Acromion Bone Region Severe depletion  Scapular Bone Region Unable to assess  Dorsal Hand Severe depletion  Patellar Region Severe depletion  Anterior Thigh Region Severe depletion  Posterior Calf Region Severe depletion  Edema (RD Assessment) None  Hair Reviewed  Eyes Reviewed  Mouth Reviewed  Skin Reviewed  Nails Reviewed     Diet Order:   Diet Order            DIET DYS 2 Room service appropriate? Yes; Fluid consistency: Nectar Thick  Diet effective now                EDUCATION NEEDS:   Education needs have been addressed  Skin:  Skin Assessment: Reviewed RN Assessment  Last BM:  07/29/2020 per chart  Height:   Ht Readings from Last 1 Encounters:  07/29/20 6' (1.829 m)   Weight:   Wt  Readings from Last 1 Encounters:  07/29/20 54.4 kg   Ideal Body Weight:  80.9 kg  BMI:  Body mass index is 16.27 kg/m.  Estimated Nutritional Needs:   Kcal:  1700-1900  Protein:  85-95 grams  Fluid:  1.7-1.9 L /day  Mark Barnacle, MS, RD, LDN Pager number available on Amion

## 2020-07-30 NOTE — Plan of Care (Signed)
  Problem: Clinical Measurements: Goal: Will remain free from infection Outcome: Progressing Goal: Respiratory complications will improve Outcome: Progressing Goal: Cardiovascular complication will be avoided Outcome: Progressing   

## 2020-07-30 NOTE — Progress Notes (Addendum)
Progress Note    Mark [name]  ION:[id number] DOB: 06-17-1956  DOA: 07/29/2020 PCP: Secundino Ginger, PA-C      Brief Narrative:    Medical records reviewed and are as summarized below:  Mark [name] is a 64 y.o. male with medical history significant for COPD with chronic hypoxemic respiratory failure on 2 L/min oxygen via nasal cannula at night and as needed, remote history of tonsillar cancer status post treatment chemotherapy about 12 years ago, esophageal dysphagia, peripheral neuropathy, who presented to the hospital because of productive cough, worsening shortness of breath, wheezing, myalgia, generalized weakness and dizziness.  He was hypoxic in the emergency room with oxygen saturation in the low 80s. CT chest showed multifocal pneumonia.  Was admitted to the hospital for sepsis secondary to multifocal pneumonia.  He was treated with empiric IV antibiotics and IV fluids.   Assessment/Plan:   Principal Problem:   Severe sepsis (HCC) Active Problems:   COPD (chronic obstructive pulmonary disease) (HCC)   Multifocal pneumonia   Acute respiratory failure with hypoxia (HCC)   Esophageal dysphagia   Chronic respiratory failure (HCC)   Body mass index is 16.27 kg/m.  (Underweight)  Severe sepsis (with hypoxia) secondary to multifocal pneumonia, leukocytosis: Continue empiric IV antibiotics.  Follow-up blood cultures.  Acute on chronic hypoxemic respiratory failure: Continue oxygen via nasal cannula.  He is tolerating 2 L/min.  COPD: Continue bronchodilators  Depression and anxiety: Continue psychotropics  Esophageal dysphagia: Continue dysphagia 2 diet   Diet Order            DIET DYS 2 Room service appropriate? Yes; Fluid consistency: Nectar Thick  Diet effective now                    Consultants:  None   Procedures:  None    Medications:   . ARIPiprazole  2 mg Oral Daily  . ascorbic acid  250 mg Oral BID  . aspirin EC  81 mg Oral  Daily  . [START ON 07/31/2020] azithromycin  500 mg Oral Daily  . buPROPion  150 mg Oral BID  . clonazePAM  1 mg Oral BID  . enoxaparin (LOVENOX) injection  40 mg Subcutaneous Q24H  . feeding supplement (NEPRO CARB STEADY)  237 mL Oral TID BM  . mometasone-formoterol  2 puff Inhalation BID  . multivitamin with minerals  1 tablet Oral Daily  . nicotine  21 mg Transdermal Daily  . oxyCODONE  20 mg Oral QID  . pantoprazole  40 mg Oral QHS  . pregabalin  150 mg Oral BID  . traZODone  150 mg Oral QHS  . venlafaxine  37.5 mg Oral BID WC   Continuous Infusions: . sodium chloride 125 mL/hr at 07/30/20 0808  . cefTRIAXone (ROCEPHIN)  IV 2 g (07/30/20 0824)     Anti-infectives (From admission, onward)   Start     Dose/Rate Route Frequency Ordered Stop   07/31/20 1000  azithromycin (ZITHROMAX) tablet 500 mg        500 mg Oral Daily 07/30/20 1138 08/01/20 0959   07/30/20 1000  cefTRIAXone (ROCEPHIN) 2 g in sodium chloride 0.9 % 100 mL IVPB        2 g 200 mL/hr over 30 Minutes Intravenous Every 24 hours 07/29/20 1229     07/30/20 1000  azithromycin (ZITHROMAX) 500 mg in sodium chloride 0.9 % 250 mL IVPB  Status:  Discontinued        500  mg 250 mL/hr over 60 Minutes Intravenous Every 24 hours 07/29/20 1229 07/30/20 1138   07/29/20 1015  cefTRIAXone (ROCEPHIN) 2 g in sodium chloride 0.9 % 100 mL IVPB  Status:  Discontinued        2 g 200 mL/hr over 30 Minutes Intravenous Every 24 hours 07/29/20 1006 07/29/20 1235   07/29/20 1015  azithromycin (ZITHROMAX) 500 mg in sodium chloride 0.9 % 250 mL IVPB  Status:  Discontinued        500 mg 250 mL/hr over 60 Minutes Intravenous Every 24 hours 07/29/20 1006 07/29/20 1235             Family Communication/Anticipated D/C date and plan/Code Status   DVT prophylaxis: enoxaparin (LOVENOX) injection 40 mg Start: 07/29/20 1500     Code Status: Full Code  Family Communication: None Disposition Plan:    Status is: Inpatient  Remains  inpatient appropriate because:IV treatments appropriate due to intensity of illness or inability to take PO   Dispo: The patient is from: Home              Anticipated d/c is to: Home              Anticipated d/c date is: 1 day              Patient currently is not medically stable to d/c.           Subjective:   C/o cough , shortness of breath but symptoms are little better compared to yesterday.  He had some dizziness yesterday but that has improved as well.  Objective:    Vitals:   07/30/20 0355 07/30/20 0751 07/30/20 1151 07/30/20 1510  BP: 119/75 122/74 116/85 124/76  Pulse: 79 70 79 71  Resp: 16 18 18 19   Temp: (!) 97.5 F (36.4 C) 97.7 F (36.5 C) 97.8 F (36.6 C) 98 F (36.7 C)  TempSrc: Oral Oral Oral Oral  SpO2: 98% 92% 96% 97%  Weight:      Height:       No data found.   Intake/Output Summary (Last 24 hours) at 07/30/2020 1511 Last data filed at 07/30/2020 1500 Gross per 24 hour  Intake 3359.82 ml  Output --  Net 3359.82 ml   Filed Weights   07/29/20 0857  Weight: 54.4 kg    Exam:  GEN: NAD SKIN: Warm and dry EYES: EOMI ENT: MMM CV: RRR PULM: B/l wheezing. No rales heard ABD: soft, ND, NT, +BS CNS: AAO x 3, non focal EXT: No edema or tenderness   Data Reviewed:   I have personally reviewed following labs and imaging studies:  Labs: Labs show the following:   Basic Metabolic Panel: Recent Labs  Lab 07/29/20 0900 07/29/20 0946 07/30/20 0456  NA 138  --  138  K 3.7  --  4.1  CL 98  --  99  CO2 30  --  31  GLUCOSE 216*  --  182*  BUN 12  --  12  CREATININE 0.77  --  0.67  CALCIUM 8.7*  --  8.5*  MG  --  2.0  --    GFR Estimated Creatinine Clearance: 71.8 mL/min (by C-G formula based on SCr of 0.67 mg/dL). Liver Function Tests: Recent Labs  Lab 07/29/20 0900  AST 28  ALT 16  ALKPHOS 84  BILITOT 0.6  PROT 7.5  ALBUMIN 3.7   No results for input(s): LIPASE, AMYLASE in the last 168 hours. No results for  input(s): AMMONIA in the last 168 hours. Coagulation profile Recent Labs  Lab 07/30/20 0456  INR 1.2    CBC: Recent Labs  Lab 07/29/20 0900 07/30/20 0456  WBC 21.2* 8.5  NEUTROABS 19.3*  --   HGB 12.4* 10.1*  HCT 38.1* 30.8*  MCV 93.4 92.5  PLT 228 178   Cardiac Enzymes: No results for input(s): CKTOTAL, CKMB, CKMBINDEX, TROPONINI in the last 168 hours. BNP (last 3 results) No results for input(s): PROBNP in the last 8760 hours. CBG: No results for input(s): GLUCAP in the last 168 hours. D-Dimer: No results for input(s): DDIMER in the last 72 hours. Hgb A1c: No results for input(s): HGBA1C in the last 72 hours. Lipid Profile: No results for input(s): CHOL, HDL, LDLCALC, TRIG, CHOLHDL, LDLDIRECT in the last 72 hours. Thyroid function studies: No results for input(s): TSH, T4TOTAL, T3FREE, THYROIDAB in the last 72 hours.  Invalid input(s): FREET3 Anemia work up: No results for input(s): VITAMINB12, FOLATE, FERRITIN, TIBC, IRON, RETICCTPCT in the last 72 hours. Sepsis Labs: Recent Labs  Lab 07/29/20 0900 07/29/20 0946 07/30/20 0456  PROCALCITON  --   --  0.20  WBC 21.2*  --  8.5  LATICACIDVEN  --  1.2  --     Microbiology Recent Results (from the past 240 hour(s))  Resp Panel by RT-PCR (Flu A&B, Covid) Nasopharyngeal Swab     Status: None   Collection Time: 07/29/20  9:46 AM   Specimen: Nasopharyngeal Swab; Nasopharyngeal(NP) swabs in vial transport medium  Result Value Ref Range Status   SARS Coronavirus 2 by RT PCR NEGATIVE NEGATIVE Final    Comment: (NOTE) SARS-CoV-2 target nucleic acids are NOT DETECTED.  The SARS-CoV-2 RNA is generally detectable in upper respiratory specimens during the acute phase of infection. The lowest concentration of SARS-CoV-2 viral copies this assay can detect is 138 copies/mL. A negative result does not preclude SARS-Cov-2 infection and should not be used as the sole basis for treatment or other patient management decisions.  A negative result may occur with  improper specimen collection/handling, submission of specimen other than nasopharyngeal swab, presence of viral mutation(s) within the areas targeted by this assay, and inadequate number of viral copies(<138 copies/mL). A negative result must be combined with clinical observations, patient history, and epidemiological information. The expected result is Negative.  Fact Sheet for Patients:  EntrepreneurPulse.com.au  Fact Sheet for Healthcare Providers:  IncredibleEmployment.be  This test is no t yet approved or cleared by the [place] FDA and  has been authorized for detection and/or diagnosis of SARS-CoV-2 by FDA under an Emergency Use Authorization (EUA). This EUA will remain  in effect (meaning this test can be used) for the duration of the COVID-19 declaration under Section 564(b)(1) of the Act, 21 U.S.C.section 360bbb-3(b)(1), unless the authorization is terminated  or revoked sooner.       Influenza A by PCR NEGATIVE NEGATIVE Final   Influenza B by PCR NEGATIVE NEGATIVE Final    Comment: (NOTE) The Xpert Xpress SARS-CoV-2/FLU/RSV plus assay is intended as an aid in the diagnosis of influenza from Nasopharyngeal swab specimens and should not be used as a sole basis for treatment. Nasal washings and aspirates are unacceptable for Xpert Xpress SARS-CoV-2/FLU/RSV testing.  Fact Sheet for Patients: EntrepreneurPulse.com.au  Fact Sheet for Healthcare Providers: IncredibleEmployment.be  This test is not yet approved or cleared by the [place] FDA and has been authorized for detection and/or diagnosis of SARS-CoV-2 by FDA under an Emergency Use Authorization (EUA). This  EUA will remain in effect (meaning this test can be used) for the duration of the COVID-19 declaration under Section 564(b)(1) of the Act, 21 U.S.C. section 360bbb-3(b)(1), unless the authorization  is terminated or revoked.  Performed at [facility], [address] [place], [place] [address]   Blood culture (routine x 2)     Status: None (Preliminary result)   Collection Time: 07/29/20  9:46 AM   Specimen: BLOOD  Result Value Ref Range Status   Specimen Description BLOOD RFA  Final   Special Requests   Final    BOTTLES DRAWN AEROBIC AND ANAEROBIC Blood Culture adequate volume   Culture   Final    NO GROWTH < 24 HOURS Performed at [facility], [address] [place], [place] [address]    Report Status PENDING  Incomplete  Blood culture (routine x 2)     Status: None (Preliminary result)   Collection Time: 07/29/20  4:14 PM   Specimen: BLOOD  Result Value Ref Range Status   Specimen Description BLOOD LAC  Final   Special Requests   Final    BOTTLES DRAWN AEROBIC AND ANAEROBIC Blood Culture adequate volume   Culture   Final    NO GROWTH < 24 HOURS Performed at [facility], [address], [place], [place] [address]    Report Status PENDING  Incomplete    Procedures and diagnostic studies:  DG Chest 2 View  Result Date: 07/29/2020 CLINICAL DATA:  Shortness of breath. EXAM: CHEST - 2 VIEW COMPARISON:  May 03, 2018. FINDINGS: The heart size and mediastinal contours are within normal limits. Stable diffuse coarse reticular densities are noted throughout the right lung most consistent with chronic interstitial lung disease or scarring. Increased coarse interstitial densities are noted in the left lung which may represent scarring or chronic interstitial lung disease, but acute superimposed inflammation cannot be excluded. No pneumothorax is noted. Blunting of the left costophrenic sulcus is noted and unchanged most consistent with scarring. The visualized skeletal structures are unremarkable. IMPRESSION: Stable diffuse coarse reticular densities are noted throughout the right lung most consistent with chronic interstitial lung disease or  scarring. Increased coarse interstitial densities are noted in the left lung which may represent scarring or chronic interstitial lung disease, but acute superimposed inflammation cannot be excluded. Electronically Signed   By: Marijo Conception M.D.   On: 07/29/2020 09:42   CT CHEST WO CONTRAST  Result Date: 07/29/2020 CLINICAL DATA:  Shortness of breath, hypoxia, leukocytosis EXAM: CT CHEST WITHOUT CONTRAST TECHNIQUE: Multidetector CT imaging of the chest was performed following the standard protocol without IV contrast. COMPARISON:  CT 12/25/2015.  Same day chest x-ray FINDINGS: Cardiovascular: Normal heart size. Trace pericardial fluid. Thoracic aorta is nonaneurysmal. Scattered aortic atherosclerotic calcifications. Mediastinum/Nodes: No axillary or mediastinal lymphadenopathy. Evaluation of the hilar structures is limited in the absence of intravenous contrast. Trace amount of debris within the trachea. Thyroid and esophagus within normal limits. Lungs/Pleura: Advanced centrilobular and paraseptal emphysema with bullous changes of the right lung apex. No pneumothorax. Multifocal areas of airspace opacity and scattered ground-glass throughout both lungs with a slight upper lobe predominance. More focal consolidations within the periphery of the right upper lobe (series 3, image 45 and within the right perihilar region (series 3, image 80). No pleural effusion. Upper Abdomen: Stable 1.5 cm right hepatic lobe cyst. No acute findings within the included portion of the upper abdomen. Musculoskeletal: No acute osseous findings. No suspicious bone lesions. Paucity of  subcutaneous fat. No chest wall abnormality. IMPRESSION: 1. Multifocal areas of airspace opacity and scattered ground-glass throughout both lungs with a slight upper lobe predominance. More focal consolidations within the periphery of the right upper lobe and within the right perihilar region. Findings are favored to represent multifocal atypical  pneumonia. Follow-up to resolution is recommended. 2. Advanced centrilobular and paraseptal emphysema with bullous changes of the right lung apex. Aortic Atherosclerosis (ICD10-I70.0) and Emphysema (ICD10-J43.9). Electronically Signed   By: Davina Poke D.O.   On: 07/29/2020 14:53               LOS: 1 day   Markas Aldredge  [facility]   Pager on [email or web address]. If 7PM-7AM, please contact night-coverage at [email or web address]     07/30/2020, 3:11 PM

## 2020-07-31 LAB — BASIC METABOLIC PANEL
Anion gap: 5 (ref 5–15)
BUN: 10 mg/dL (ref 8–23)
CO2: 34 mmol/L — ABNORMAL HIGH (ref 22–32)
Calcium: 8.2 mg/dL — ABNORMAL LOW (ref 8.9–10.3)
Chloride: 104 mmol/L (ref 98–111)
Creatinine, Ser: 0.61 mg/dL (ref 0.61–1.24)
GFR, Estimated: >60 mL/min (ref >60)
Glucose, Bld: 76 mg/dL (ref 70–99)
Potassium: 3.5 mmol/L (ref 3.5–5.1)
Sodium: 143 mmol/L (ref 135–145)

## 2020-07-31 LAB — MAGNESIUM: Magnesium: 1.6 mg/dL — ABNORMAL LOW (ref 1.7–2.4)

## 2020-07-31 LAB — PHOSPHORUS: Phosphorus: 2.6 mg/dL (ref 2.5–4.6)

## 2020-07-31 MED ORDER — AMOXICILLIN-POT CLAVULANATE 875-125 MG PO TABS: 1.0000 | ORAL_TABLET | Freq: Two times a day (BID) | ORAL | 0 refills | Status: AC

## 2020-07-31 MED ORDER — MAGNESIUM OXIDE 400 (241.3 MG) MG PO TABS: 400.0000 mg | ORAL_TABLET | Freq: Two times a day (BID) | ORAL | 0 refills | Status: AC

## 2020-07-31 NOTE — Discharge Summary (Signed)
Physician Discharge Summary  Mark [name] WCB:[id number] DOB: 1955-10-28 DOA: 07/29/2020  PCP: Secundino Ginger, PA-C  Admit date: 07/29/2020 Discharge date: 07/31/2020  Discharge disposition: Home   Recommendations for Outpatient Follow-Up:   Follow-up with PCP in 1 week   Discharge Diagnosis:   Principal Problem:   Severe sepsis ([place]) Active Problems:   COPD (chronic obstructive pulmonary disease) ([employer])   Multifocal pneumonia   Acute respiratory failure with hypoxia ([place])   Esophageal dysphagia   Chronic respiratory failure ([place])    Discharge Condition: Stable.  Diet recommendation:  Diet Order            DIET DYS 2           DIET DYS 2 Room service appropriate? Yes; Fluid consistency: Nectar Thick  Diet effective now                   Code Status: Full Code     Hospital Course:   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  He presented to the hospital because of productive cough, increasing shortness of breath, myalgia and generalized weakness.  He was hypoxic in the ED with oxygen in the low 80s.  CT scan of the chest showed multifocal pneumonia.  He was admitted to the hospital for sepsis secondary to multifocal pneumonia.  He was treated with empiric IV antibiotics and IV fluids.  He also had mild hypomagnesemia and was given a prescription for magnesium oxide.  His condition has improved significantly.  He has been able to ambulate in the hallways without any symptoms.  He is deemed stable for discharge to home today.    Discharge Exam:    Vitals:   07/30/20 2335 07/31/20 0354 07/31/20 0759 07/31/20 0759  BP: 129/75 (!) 141/84 (!) 145/84 (!) 145/84  Pulse: 77 70 70 70  Resp: 16 15 18 18   Temp:  97.9 F (36.6 C) 98.1 F (36.7 C) 97.9 F (36.6 C) 97.9 F (36.6 C)  TempSrc:      SpO2: 95% 98% 96% 96%  Weight:      Height:         GEN: NAD SKIN: Warm and dry EYES: No pallor or icterus ENT: MMM CV: RRR PULM: Occasional wheezing otherwise normal ABD: soft, ND, NT, +BS CNS: AAO x 3, non focal EXT: No edema or tenderness   The results of significant diagnostics from this hospitalization (including imaging, microbiology, ancillary and laboratory) are listed below for reference.     Procedures and Diagnostic Studies:   DG Chest 2 View  Result Date: 07/29/2020 CLINICAL DATA:  Shortness of breath. EXAM: CHEST - 2 VIEW COMPARISON:  May 03, 2018. FINDINGS: The heart size and mediastinal contours are within normal limits. Stable diffuse coarse reticular densities are noted throughout the right lung most consistent with chronic interstitial lung disease or scarring. Increased coarse interstitial densities are noted in the left lung which may represent scarring or chronic interstitial lung disease, but acute superimposed inflammation cannot be excluded. No pneumothorax is noted. Blunting of the left costophrenic sulcus is noted and unchanged most consistent with scarring. The visualized skeletal structures are unremarkable. IMPRESSION: Stable diffuse coarse reticular densities are noted throughout the right lung most consistent with chronic interstitial lung disease or scarring.  Increased coarse interstitial densities are noted in the left lung which may represent scarring or chronic interstitial lung disease, but acute superimposed inflammation cannot be excluded. Electronically Signed   By: Marijo Conception M.D.   On: 07/29/2020 09:42   CT CHEST WO CONTRAST  Result Date: 07/29/2020 CLINICAL DATA:  Shortness of breath, hypoxia, leukocytosis EXAM: CT CHEST WITHOUT CONTRAST TECHNIQUE: Multidetector CT imaging of the chest was performed following the standard protocol without IV  contrast. COMPARISON:  CT 12/25/2015.  Same day chest x-ray FINDINGS: Cardiovascular: Normal heart size. Trace pericardial fluid. Thoracic aorta is nonaneurysmal. Scattered aortic atherosclerotic calcifications. Mediastinum/Nodes: No axillary or mediastinal lymphadenopathy. Evaluation of the hilar structures is limited in the absence of intravenous contrast. Trace amount of debris within the trachea. Thyroid and esophagus within normal limits. Lungs/Pleura: Advanced centrilobular and paraseptal emphysema with bullous changes of the right lung apex. No pneumothorax. Multifocal areas of airspace opacity and scattered ground-glass throughout both lungs with a slight upper lobe predominance. More focal consolidations within the periphery of the right upper lobe (series 3, image 45 and within the right perihilar region (series 3, image 80). No pleural effusion. Upper Abdomen: Stable 1.5 cm right hepatic lobe cyst. No acute findings within the included portion of the upper abdomen. Musculoskeletal: No acute osseous findings. No suspicious bone lesions. Paucity of subcutaneous fat. No chest wall abnormality. IMPRESSION: 1. Multifocal areas of airspace opacity and scattered ground-glass throughout both lungs with a slight upper lobe predominance. More focal consolidations within the periphery of the right upper lobe and within the right perihilar region. Findings are favored to represent multifocal atypical pneumonia. Follow-up to resolution is recommended. 2. Advanced centrilobular and paraseptal emphysema with bullous changes of the right lung apex. Aortic Atherosclerosis (ICD10-I70.0) and Emphysema (ICD10-J43.9). Electronically Signed   By: Davina Poke D.O.   On: 07/29/2020 14:53     Labs:   Basic Metabolic Panel: Recent Labs  Lab 07/29/20 0900 07/29/20 0946 07/30/20 0456 07/31/20 0424  NA 138  --  138 143  K 3.7  --  4.1 3.5  CL 98  --  99 104  CO2 30  --  31 34*  GLUCOSE 216*  --  182* 76  BUN 12   --  12 10  CREATININE 0.77  --  0.67 0.61  CALCIUM 8.7*  --  8.5* 8.2*  MG  --  2.0  --  1.6*  PHOS  --   --   --  2.6   GFR Estimated Creatinine Clearance: 71.8 mL/min (by C-G formula based on SCr of 0.61 mg/dL). Liver Function Tests: Recent Labs  Lab 07/29/20 0900  AST 28  ALT 16  ALKPHOS 84  BILITOT 0.6  PROT 7.5  ALBUMIN 3.7   No results for input(s): LIPASE, AMYLASE in the last 168 hours. No results for input(s): AMMONIA in the last 168 hours. Coagulation profile Recent Labs  Lab 07/30/20 0456  INR 1.2    CBC: Recent Labs  Lab 07/29/20 0900 07/30/20 0456  WBC 21.2* 8.5  NEUTROABS 19.3*  --   HGB 12.4* 10.1*  HCT 38.1* 30.8*  MCV 93.4 92.5  PLT 228 178   Cardiac Enzymes: No results for input(s): CKTOTAL, CKMB, CKMBINDEX, TROPONINI in the last 168 hours. BNP: Invalid input(s): POCBNP CBG: No results for input(s): GLUCAP in the last 168 hours. D-Dimer No results for input(s): DDIMER in the last 72 hours. Hgb A1c No results for input(s): HGBA1C in the last 72 hours. Lipid  Profile No results for input(s): CHOL, HDL, LDLCALC, TRIG, CHOLHDL, LDLDIRECT in the last 72 hours. Thyroid function studies No results for input(s): TSH, T4TOTAL, T3FREE, THYROIDAB in the last 72 hours.  Invalid input(s): FREET3 Anemia work up No results for input(s): VITAMINB12, FOLATE, FERRITIN, TIBC, IRON, RETICCTPCT in the last 72 hours. Microbiology Recent Results (from the past 240 hour(s))  Resp Panel by RT-PCR (Flu A&B, Covid) Nasopharyngeal Swab     Status: None   Collection Time: 07/29/20  9:46 AM   Specimen: Nasopharyngeal Swab; Nasopharyngeal(NP) swabs in vial transport medium  Result Value Ref Range Status   SARS Coronavirus 2 by RT PCR NEGATIVE NEGATIVE Final    Comment: (NOTE) SARS-CoV-2 target nucleic acids are NOT DETECTED.  The SARS-CoV-2 RNA is generally detectable in upper respiratory specimens during the acute phase of infection. The lowest concentration  of SARS-CoV-2 viral copies this assay can detect is 138 copies/mL. A negative result does not preclude SARS-Cov-2 infection and should not be used as the sole basis for treatment or other patient management decisions. A negative result may occur with  improper specimen collection/handling, submission of specimen other than nasopharyngeal swab, presence of viral mutation(s) within the areas targeted by this assay, and inadequate number of viral copies(<138 copies/mL). A negative result must be combined with clinical observations, patient history, and epidemiological information. The expected result is Negative.  Fact Sheet for Patients:  EntrepreneurPulse.com.au  Fact Sheet for Healthcare Providers:  IncredibleEmployment.be  This test is no t yet approved or cleared by the [place] FDA and  has been authorized for detection and/or diagnosis of SARS-CoV-2 by FDA under an Emergency Use Authorization (EUA). This EUA will remain  in effect (meaning this test can be used) for the duration of the COVID-19 declaration under Section 564(b)(1) of the Act, 21 U.S.C.section 360bbb-3(b)(1), unless the authorization is terminated  or revoked sooner.       Influenza A by PCR NEGATIVE NEGATIVE Final   Influenza B by PCR NEGATIVE NEGATIVE Final    Comment: (NOTE) The Xpert Xpress SARS-CoV-2/FLU/RSV plus assay is intended as an aid in the diagnosis of influenza from Nasopharyngeal swab specimens and should not be used as a sole basis for treatment. Nasal washings and aspirates are unacceptable for Xpert Xpress SARS-CoV-2/FLU/RSV testing.  Fact Sheet for Patients: EntrepreneurPulse.com.au  Fact Sheet for Healthcare Providers: IncredibleEmployment.be  This test is not yet approved or cleared by the [place] FDA and has been authorized for detection and/or diagnosis of SARS-CoV-2 by FDA under an Emergency Use  Authorization (EUA). This EUA will remain in effect (meaning this test can be used) for the duration of the COVID-19 declaration under Section 564(b)(1) of the Act, 21 U.S.C. section 360bbb-3(b)(1), unless the authorization is terminated or revoked.  Performed at [facility], [address] [place], [place] [address]   Blood culture (routine x 2)     Status: None (Preliminary result)   Collection Time: 07/29/20  9:46 AM   Specimen: BLOOD  Result Value Ref Range Status   Specimen Description BLOOD RFA  Final   Special Requests   Final    BOTTLES DRAWN AEROBIC AND ANAEROBIC Blood Culture adequate volume   Culture   Final    NO GROWTH 2 DAYS Performed at [facility], [address], [place], [place] [address]    Report Status PENDING  Incomplete  Blood culture (routine x 2)     Status: None (Preliminary result)   Collection Time: 07/29/20  4:14 PM   Specimen: BLOOD  Result Value Ref Range Status   Specimen Description BLOOD LAC  Final   Special Requests   Final    BOTTLES DRAWN AEROBIC AND ANAEROBIC Blood Culture adequate volume   Culture   Final    NO GROWTH 2 DAYS Performed at [facility], [address], [place], [place] [address]    Report Status PENDING  Incomplete     Discharge Instructions:   Discharge Instructions    DIET DYS 2   Complete by: As directed    Fluid consistency: Nectar Thick   Discharge instructions   Complete by: As directed    Continue to use 2 L/min oxygen via nasal cannula at night and as needed during the day.   Increase activity slowly   Complete by: As directed      Allergies as of 07/31/2020      Reactions   Morphine And Related Nausea And Vomiting      Medication List    TAKE these medications   albuterol 108 (90 Base) MCG/ACT inhaler Commonly known as: VENTOLIN HFA Inhale 2 puffs into the lungs every 6 (six) hours as needed for wheezing or shortness of breath.   amoxicillin-clavulanate 875-125  MG tablet Commonly known as: Augmentin Take 1 tablet by mouth 2 (two) times daily for 4 days. Notes to patient: None given in hospital   ARIPiprazole 2 MG tablet Commonly known as: ABILIFY Take 2 mg by mouth daily.   ascorbic acid 250 MG tablet Commonly known as: VITAMIN C Take 1 tablet (250 mg total) by mouth 2 (two) times daily.   aspirin EC 81 MG tablet 81 mg.   budesonide-formoterol 160-4.5 MCG/ACT inhaler Commonly known as: SYMBICORT Inhale into the lungs. Notes to patient: None given in hospital   buPROPion 150 MG 12 hr tablet Commonly known as: WELLBUTRIN SR 150 mg.   clonazePAM 1 MG tablet Commonly known as: KLONOPIN Take 1 mg by mouth 2 (two) times daily.   DULoxetine 60 MG capsule Commonly known as: CYMBALTA Take 60 mg by mouth daily. Notes to patient: None given in hospital   feeding supplement (NEPRO CARB STEADY) Liqd Take 237 mLs by mouth 3 (three) times daily between meals.   Ipratropium-Albuterol 20-100 MCG/ACT Aers respimat Commonly known as: COMBIVENT Inhale 2 puffs into the lungs 2 (two) times daily. Notes to patient: None given in hospital   magnesium oxide 400 (241.3 Mg) MG tablet Commonly known as: MAG-OX Take 1 tablet (400 mg total) by mouth 2 (two) times daily for 3 days.   mometasone 50 MCG/ACT nasal spray Commonly known as: NASONEX Place 2 sprays into both nostrils 2 (two) times daily. Notes to patient: None given in hospital   multivitamin with minerals Tabs tablet Take 1 tablet by mouth daily.   Oxycodone HCl 20 MG Tabs Take 20 mg by mouth 4 (four) times daily.   pantoprazole 40 MG tablet Commonly known as: PROTONIX Take 40 mg by mouth at bedtime.   pregabalin 150 MG capsule Commonly known as: LYRICA Take 1 capsule by mouth 2 (two) times daily.   traZODone 150 MG tablet Commonly known as: DESYREL Take 150 mg by mouth at bedtime.   venlafaxine 37.5 MG tablet Commonly known as: EFFEXOR Take 37.5 mg by mouth 2 (two) times  daily with a meal.         Time coordinating discharge: 31 minutes  Signed:  Kyannah Climer  [facility] 07/31/2020, 11:47 AM  Pager on [email or web address]. If 7PM-7AM, please contact night-coverage at [email or web address]

## 2020-07-31 NOTE — Progress Notes (Signed)
Discharge note:  Pt discharged home today. Discharge instructions provided to patient. Verbalized understanding. IV removed. Driven home by family member. Wheeled to car by staff.  Ronnette Hila, RN

## 2020-08-08 LAB — CULTURE, BLOOD (ROUTINE X 2)
Culture: NO GROWTH
Culture: NO GROWTH
Special Requests: ADEQUATE
Special Requests: ADEQUATE

## 2020-08-13 ENCOUNTER — Other Ambulatory Visit: Payer: Self-pay

## 2020-08-13 ENCOUNTER — Emergency Department: Payer: Medicaid Other

## 2020-08-13 ENCOUNTER — Encounter: Payer: Self-pay | Admitting: Emergency Medicine

## 2020-08-13 ENCOUNTER — Inpatient Hospital Stay
Admission: EM | Admit: 2020-08-13 | Discharge: 2020-08-16 | DRG: 179 | Disposition: A | Discharge status: Home Health | Payer: Medicaid Other | Attending: Internal Medicine | Admitting: Internal Medicine

## 2020-08-13 DIAGNOSIS — F32A Depression, unspecified: Secondary | ICD-10-CM | POA: Diagnosis present

## 2020-08-13 DIAGNOSIS — J449 Chronic obstructive pulmonary disease, unspecified: Secondary | ICD-10-CM | POA: Diagnosis present

## 2020-08-13 DIAGNOSIS — Z79899 Other long term (current) drug therapy: Secondary | ICD-10-CM

## 2020-08-13 DIAGNOSIS — Z823 Family history of stroke: Secondary | ICD-10-CM | POA: Diagnosis not present

## 2020-08-13 DIAGNOSIS — F419 Anxiety disorder, unspecified: Secondary | ICD-10-CM | POA: Diagnosis present

## 2020-08-13 DIAGNOSIS — Z7951 Long term (current) use of inhaled steroids: Secondary | ICD-10-CM | POA: Diagnosis not present

## 2020-08-13 DIAGNOSIS — H539 Unspecified visual disturbance: Secondary | ICD-10-CM | POA: Diagnosis present

## 2020-08-13 DIAGNOSIS — I959 Hypotension, unspecified: Secondary | ICD-10-CM | POA: Diagnosis not present

## 2020-08-13 DIAGNOSIS — Z885 Allergy status to narcotic agent status: Secondary | ICD-10-CM | POA: Diagnosis not present

## 2020-08-13 DIAGNOSIS — R1310 Dysphagia, unspecified: Secondary | ICD-10-CM | POA: Diagnosis present

## 2020-08-13 DIAGNOSIS — G8929 Other chronic pain: Secondary | ICD-10-CM | POA: Diagnosis present

## 2020-08-13 DIAGNOSIS — Z7982 Long term (current) use of aspirin: Secondary | ICD-10-CM | POA: Diagnosis not present

## 2020-08-13 DIAGNOSIS — E861 Hypovolemia: Secondary | ICD-10-CM | POA: Diagnosis present

## 2020-08-13 DIAGNOSIS — Z20822 Contact with and (suspected) exposure to covid-19: Secondary | ICD-10-CM | POA: Diagnosis present

## 2020-08-13 DIAGNOSIS — G629 Polyneuropathy, unspecified: Secondary | ICD-10-CM | POA: Diagnosis present

## 2020-08-13 DIAGNOSIS — Z79891 Long term (current) use of opiate analgesic: Secondary | ICD-10-CM

## 2020-08-13 DIAGNOSIS — I951 Orthostatic hypotension: Secondary | ICD-10-CM | POA: Diagnosis present

## 2020-08-13 DIAGNOSIS — R2681 Unsteadiness on feet: Secondary | ICD-10-CM | POA: Diagnosis present

## 2020-08-13 DIAGNOSIS — Z923 Personal history of irradiation: Secondary | ICD-10-CM

## 2020-08-13 DIAGNOSIS — R531 Weakness: Secondary | ICD-10-CM | POA: Diagnosis not present

## 2020-08-13 DIAGNOSIS — Z720 Tobacco use: Secondary | ICD-10-CM | POA: Diagnosis not present

## 2020-08-13 DIAGNOSIS — Z85818 Personal history of malignant neoplasm of other sites of lip, oral cavity, and pharynx: Secondary | ICD-10-CM

## 2020-08-13 DIAGNOSIS — Z8043 Family history of malignant neoplasm of testis: Secondary | ICD-10-CM

## 2020-08-13 DIAGNOSIS — J189 Pneumonia, unspecified organism: Secondary | ICD-10-CM

## 2020-08-13 DIAGNOSIS — J690 Pneumonitis due to inhalation of food and vomit: Secondary | ICD-10-CM | POA: Diagnosis present

## 2020-08-13 DIAGNOSIS — Z9049 Acquired absence of other specified parts of digestive tract: Secondary | ICD-10-CM

## 2020-08-13 DIAGNOSIS — E871 Hypo-osmolality and hyponatremia: Secondary | ICD-10-CM

## 2020-08-13 DIAGNOSIS — F17210 Nicotine dependence, cigarettes, uncomplicated: Secondary | ICD-10-CM | POA: Diagnosis present

## 2020-08-13 DIAGNOSIS — K219 Gastro-esophageal reflux disease without esophagitis: Secondary | ICD-10-CM | POA: Diagnosis present

## 2020-08-13 DIAGNOSIS — Z9109 Other allergy status, other than to drugs and biological substances: Secondary | ICD-10-CM | POA: Diagnosis not present

## 2020-08-13 DIAGNOSIS — Z800 Family history of malignant neoplasm of digestive organs: Secondary | ICD-10-CM | POA: Diagnosis not present

## 2020-08-13 LAB — BASIC METABOLIC PANEL
Anion gap: 6 (ref 5–15)
BUN: 12 mg/dL (ref 8–23)
CO2: 33 mmol/L — ABNORMAL HIGH (ref 22–32)
Calcium: 8.9 mg/dL (ref 8.9–10.3)
Chloride: 94 mmol/L — ABNORMAL LOW (ref 98–111)
Creatinine, Ser: 0.65 mg/dL (ref 0.61–1.24)
GFR, Estimated: >60 mL/min (ref >60)
Glucose, Bld: 110 mg/dL — ABNORMAL HIGH (ref 70–99)
Potassium: 5 mmol/L (ref 3.5–5.1)
Sodium: 133 mmol/L — ABNORMAL LOW (ref 135–145)

## 2020-08-13 LAB — LACTIC ACID, PLASMA
Lactic Acid, Venous: 1.1 mmol/L (ref 0.5–1.9)
Lactic Acid, Venous: 1.8 mmol/L (ref 0.5–1.9)

## 2020-08-13 LAB — CBC
HCT: 35.4 % — ABNORMAL LOW (ref 39.0–52.0)
Hemoglobin: 11 g/dL — ABNORMAL LOW (ref 13.0–17.0)
MCH: 29.6 pg (ref 26.0–34.0)
MCHC: 31.1 g/dL (ref 30.0–36.0)
MCV: 95.2 fL (ref 80.0–100.0)
Platelets: 218 10*3/uL (ref 150–400)
RBC: 3.72 MIL/uL — ABNORMAL LOW (ref 4.22–5.81)
RDW: 14.3 % (ref 11.5–15.5)
WBC: 14.9 10*3/uL — ABNORMAL HIGH (ref 4.0–10.5)
nRBC: 0 % (ref 0.0–0.2)

## 2020-08-13 LAB — RESP PANEL BY RT-PCR (FLU A&B, COVID) ARPGX2
Influenza A by PCR: NEGATIVE
Influenza B by PCR: NEGATIVE
SARS Coronavirus 2 by RT PCR: NEGATIVE

## 2020-08-13 LAB — BRAIN NATRIURETIC PEPTIDE: B Natriuretic Peptide: 187.4 pg/mL — ABNORMAL HIGH (ref 0.0–100.0)

## 2020-08-13 LAB — PROCALCITONIN: Procalcitonin: <0.1 ng/mL

## 2020-08-13 LAB — TROPONIN I (HIGH SENSITIVITY): Troponin I (High Sensitivity): 8 ng/L (ref <18)

## 2020-08-13 MED ORDER — PANTOPRAZOLE SODIUM 40 MG PO TBEC
40.0000 mg | DELAYED_RELEASE_TABLET | Freq: Every day | ORAL | Status: DC
  Administered 2020-08-13 – 2020-08-15 (×3): 40 mg via ORAL
  Filled 2020-08-13 (×3): qty 1

## 2020-08-13 MED ORDER — LACTATED RINGERS IV BOLUS (SEPSIS)
1000.0000 mL | Freq: Once | INTRAVENOUS | Status: AC
  Administered 2020-08-13: 18:00:00 1000 mL via INTRAVENOUS

## 2020-08-13 MED ORDER — SODIUM CHLORIDE 0.9 % IV SOLN
2.0000 g | Freq: Once | INTRAVENOUS | Status: AC
  Administered 2020-08-13: 18:00:00 2 g via INTRAVENOUS
  Filled 2020-08-13: qty 2

## 2020-08-13 MED ORDER — ALBUTEROL SULFATE HFA 108 (90 BASE) MCG/ACT IN AERS
2.0000 | INHALATION_SPRAY | Freq: Four times a day (QID) | RESPIRATORY_TRACT | Status: DC | PRN
  Filled 2020-08-13: qty 6.7

## 2020-08-13 MED ORDER — ASPIRIN EC 81 MG PO TBEC
81.0000 mg | DELAYED_RELEASE_TABLET | Freq: Every day | ORAL | Status: DC
  Administered 2020-08-14 – 2020-08-16 (×2): 81 mg via ORAL
  Filled 2020-08-13 (×2): qty 1

## 2020-08-13 MED ORDER — VANCOMYCIN HCL IN DEXTROSE 1-5 GM/200ML-% IV SOLN
1000.0000 mg | Freq: Once | INTRAVENOUS | Status: DC
  Filled 2020-08-13: qty 200

## 2020-08-13 MED ORDER — ACETAMINOPHEN 325 MG PO TABS: 650.0000 mg | ORAL_TABLET | Freq: Four times a day (QID) | ORAL | Status: DC | PRN

## 2020-08-13 MED ORDER — ONDANSETRON HCL 4 MG/2ML IJ SOLN: 4.0000 mg | Freq: Four times a day (QID) | INTRAMUSCULAR | Status: DC | PRN

## 2020-08-13 MED ORDER — ACETAMINOPHEN 650 MG RE SUPP: 650.0000 mg | Freq: Four times a day (QID) | RECTAL | Status: DC | PRN

## 2020-08-13 MED ORDER — ENOXAPARIN SODIUM 40 MG/0.4ML ~~LOC~~ SOLN
40.0000 mg | SUBCUTANEOUS | Status: DC
  Administered 2020-08-14 – 2020-08-16 (×4): 40 mg via SUBCUTANEOUS
  Filled 2020-08-13 (×4): qty 0.4

## 2020-08-13 MED ORDER — ARIPIPRAZOLE 2 MG PO TABS
2.0000 mg | ORAL_TABLET | Freq: Every day | ORAL | Status: DC
  Administered 2020-08-14 – 2020-08-16 (×3): 2 mg via ORAL
  Filled 2020-08-13 (×4): qty 1

## 2020-08-13 MED ORDER — DM-GUAIFENESIN ER 30-600 MG PO TB12
1.0000 | ORAL_TABLET | Freq: Two times a day (BID) | ORAL | Status: DC
  Administered 2020-08-13 – 2020-08-16 (×6): 1 via ORAL
  Filled 2020-08-13 (×6): qty 1

## 2020-08-13 MED ORDER — FLUTICASONE PROPIONATE 50 MCG/ACT NA SUSP
2.0000 | Freq: Every day | NASAL | Status: DC
  Administered 2020-08-14 – 2020-08-16 (×3): 2 via NASAL
  Filled 2020-08-13: qty 16

## 2020-08-13 MED ORDER — TRAZODONE HCL 50 MG PO TABS
150.0000 mg | ORAL_TABLET | Freq: Every day | ORAL | Status: DC
  Administered 2020-08-13 – 2020-08-15 (×3): 150 mg via ORAL
  Filled 2020-08-13 (×3): qty 1

## 2020-08-13 MED ORDER — IPRATROPIUM-ALBUTEROL 0.5-2.5 (3) MG/3ML IN SOLN
3.0000 mL | Freq: Four times a day (QID) | RESPIRATORY_TRACT | Status: DC
  Administered 2020-08-14 (×3): 3 mL via RESPIRATORY_TRACT
  Filled 2020-08-13 (×3): qty 3

## 2020-08-13 MED ORDER — ONDANSETRON HCL 4 MG PO TABS: 4.0000 mg | ORAL_TABLET | Freq: Four times a day (QID) | ORAL | Status: DC | PRN

## 2020-08-13 MED ORDER — CLONAZEPAM 1 MG PO TABS
1.0000 mg | ORAL_TABLET | Freq: Two times a day (BID) | ORAL | Status: DC
  Administered 2020-08-13 – 2020-08-16 (×5): 1 mg via ORAL
  Filled 2020-08-13 (×5): qty 1

## 2020-08-13 MED ORDER — SODIUM CHLORIDE 0.9 % IV SOLN
500.0000 mg | INTRAVENOUS | Status: DC
  Administered 2020-08-14 (×2): 500 mg via INTRAVENOUS
  Filled 2020-08-13 (×3): qty 500

## 2020-08-13 MED ORDER — SODIUM CHLORIDE 0.9 % IV SOLN
3.0000 g | Freq: Four times a day (QID) | INTRAVENOUS | Status: DC
  Filled 2020-08-13 (×3): qty 8

## 2020-08-13 MED ORDER — LACTATED RINGERS IV SOLN: INTRAVENOUS | Status: DC

## 2020-08-13 MED ORDER — VANCOMYCIN HCL 1250 MG/250ML IV SOLN
1250.0000 mg | Freq: Once | INTRAVENOUS | Status: AC
  Administered 2020-08-13: 19:00:00 1250 mg via INTRAVENOUS
  Filled 2020-08-13: qty 250

## 2020-08-13 MED ORDER — SODIUM CHLORIDE 0.9 % IV SOLN: INTRAVENOUS | Status: DC

## 2020-08-13 MED ORDER — VENLAFAXINE HCL 37.5 MG PO TABS
37.5000 mg | ORAL_TABLET | Freq: Two times a day (BID) | ORAL | Status: DC
  Administered 2020-08-14 – 2020-08-16 (×5): 37.5 mg via ORAL
  Filled 2020-08-13 (×7): qty 1

## 2020-08-13 MED ORDER — TRAZODONE HCL 50 MG PO TABS: 25.0000 mg | ORAL_TABLET | Freq: Every evening | ORAL | Status: DC | PRN

## 2020-08-13 MED ORDER — BUPROPION HCL ER (SR) 150 MG PO TB12
150.0000 mg | ORAL_TABLET | Freq: Every day | ORAL | Status: DC
  Administered 2020-08-14 – 2020-08-16 (×3): 150 mg via ORAL
  Filled 2020-08-13 (×4): qty 1

## 2020-08-13 MED ORDER — ADULT MULTIVITAMIN W/MINERALS CH
1.0000 | ORAL_TABLET | Freq: Every day | ORAL | Status: DC
  Administered 2020-08-14 – 2020-08-16 (×3): 1 via ORAL
  Filled 2020-08-13 (×3): qty 1

## 2020-08-13 MED ORDER — GUAIFENESIN ER 600 MG PO TB12
600.0000 mg | ORAL_TABLET | Freq: Two times a day (BID) | ORAL | Status: DC
  Administered 2020-08-13 – 2020-08-16 (×6): 600 mg via ORAL
  Filled 2020-08-13 (×5): qty 1

## 2020-08-13 MED ORDER — PREGABALIN 75 MG PO CAPS
150.0000 mg | ORAL_CAPSULE | Freq: Two times a day (BID) | ORAL | Status: DC
  Administered 2020-08-13 – 2020-08-16 (×5): 150 mg via ORAL
  Filled 2020-08-13 (×5): qty 2

## 2020-08-13 MED ORDER — ASCORBIC ACID 500 MG PO TABS
250.0000 mg | ORAL_TABLET | Freq: Two times a day (BID) | ORAL | Status: DC
  Administered 2020-08-13 – 2020-08-16 (×6): 250 mg via ORAL
  Filled 2020-08-13 (×6): qty 1

## 2020-08-13 MED ORDER — DULOXETINE HCL 60 MG PO CPEP
60.0000 mg | ORAL_CAPSULE | Freq: Every day | ORAL | Status: DC
  Administered 2020-08-14 – 2020-08-16 (×3): 60 mg via ORAL
  Filled 2020-08-13 (×3): qty 1

## 2020-08-13 MED ORDER — SODIUM CHLORIDE 0.9 % IV SOLN
3.0000 g | Freq: Four times a day (QID) | INTRAVENOUS | Status: DC
  Administered 2020-08-14 – 2020-08-15 (×6): 3 g via INTRAVENOUS
  Filled 2020-08-13: qty 3
  Filled 2020-08-13: qty 8
  Filled 2020-08-13: qty 3
  Filled 2020-08-13 (×2): qty 8
  Filled 2020-08-13: qty 3
  Filled 2020-08-13 (×2): qty 8
  Filled 2020-08-13: qty 3

## 2020-08-13 MED ORDER — NEPRO/CARBSTEADY PO LIQD
237.0000 mL | Freq: Three times a day (TID) | ORAL | Status: DC
  Administered 2020-08-14 (×2): 237 mL via ORAL

## 2020-08-13 NOTE — ED Notes (Signed)
Placed on hospital oxygen tank.

## 2020-08-13 NOTE — H&P (Signed)
[place]   PATIENT NAME: Mark [name]    MR#:  [id number]  DATE OF BIRTH:  27-May-1956  DATE OF ADMISSION:  08/13/2020  PRIMARY CARE PHYSICIAN: Secundino Ginger, PA-C   REQUESTING/REFERRING PHYSICIAN: Nance Pear, MD  CHIEF COMPLAINT:   Chief Complaint  Patient presents with  . Shortness of Breath    HISTORY OF PRESENT ILLNESS:  Mark [name]  is a 63 y.o. Caucasian male with a known history of COPD and aspiration pneumonia who was recently admitted for acute respiratory failure secondary to multifocal pneumonia and was discharged on 12/14.  He has been doing fairly well until last night when he started having worsening dyspnea with associated lightheadedness and dizziness and cough productive of brownish sputum as well as wheezing.  He usually uses O2 at 2 L/min nightly and since last discharge has been using it during the daytime.  He denied any fever or chills.  No dysuria, oliguria or hematuria or flank pain.  He has been vaccinated against COVID-19 as well as influenza.  He has been getting thickened liquids and then had significant choking on food, nausea or vomiting.  Upon presentation to the emergency room, blood pressure was was 90/55 and later 142/96.  Pulse oximetry was 94 to 97% on 2 L of O2 by nasal cannula.  Labs revealed borderline potassium of 5 and CO2 of 33.  Lactic acid was 1.1 and later 1.8 and procalcitonin less than 0.1.  CBC showed leukocytosis of 14.9.  Influenza antigens and COVID-19 PCR came back negative.  Blood cultures were drawn.  EKG showed normal sinus rhythm with rate of 78.  Two-view chest x-ray showed persistent multifocal patchy lung opacities with significant chronic underlying lung disease.  The patient was given IV cefepime, lactated Ringer with bolus and infusion.  He will be admitted to a medical monitored bed for further evaluation and management. PAST MEDICAL HISTORY:   Past Medical History:  Diagnosis Date  . Arthritis    left  hand  . Aspiration pneumonia ([place])   . Cancer ([place])    tonsilal ca  . COPD (chronic obstructive pulmonary disease) ([place])   . Incontinence    night time, began recently  . Memory deficit 11/18/2014  . PEG tube 11/18/2014   removed summer 2017  . Peripheral neuropathy 11/18/2014  . Smokers' cough ([place])   . Tonsillar cancer, S/P surgery, chemotherapy XRT 2009-followed at [facility] 11/18/2014  . Wears dentures    full upper and lower    PAST SURGICAL HISTORY:   Past Surgical History:  Procedure Laterality Date  . APPENDECTOMY    . CHOLECYSTECTOMY  2007  . ESOPHAGEAL DILATION N/A 08/01/2016   Procedure: ESOPHAGEAL DILATION;  Surgeon: [name], MD;  Location: [place];  Service: Endoscopy;  Laterality: N/A;  . ESOPHAGEAL DILATION N/A 02/19/2017   Procedure: ESOPHAGEAL DILATION;  Surgeon: [name], MD;  Location: [place];  Service: Endoscopy;  Laterality: N/A;  . ESOPHAGEAL DILATION N/A 08/27/2017   Procedure: ESOPHAGEAL DILATION;  Surgeon: [name], MD;  Location: [place];  Service: Endoscopy;  Laterality: N/A;  . ESOPHAGEAL DILATION  04/26/2020   Procedure: ESOPHAGEAL DILATION;  Surgeon: [name], MD;  Location: [place];  Service: Endoscopy;;  . ESOPHAGOGASTRODUODENOSCOPY (EGD) WITH PROPOFOL N/A 01/24/2016   Procedure: ESOPHAGOGASTRODUODENOSCOPY (EGD) WITH gastric biopsy and esophageal dilation.;  Surgeon: [name], MD;  Location: [place];  Service: Endoscopy;  Laterality: N/A;  . ESOPHAGOGASTRODUODENOSCOPY (EGD) WITH  PROPOFOL N/A 08/01/2016   Procedure: ESOPHAGOGASTRODUODENOSCOPY (EGD) WITH PROPOFOL;  Surgeon: [name], MD;  Location: [place];  Service: Endoscopy;  Laterality: N/A;  . ESOPHAGOGASTRODUODENOSCOPY (EGD) WITH PROPOFOL N/A 02/19/2017   Procedure: ESOPHAGOGASTRODUODENOSCOPY (EGD) WITH PROPOFOL;  Surgeon: [name], MD;  Location: [place];  Service: Endoscopy;  Laterality: N/A;  .  ESOPHAGOGASTRODUODENOSCOPY (EGD) WITH PROPOFOL N/A 08/27/2017   Procedure: ESOPHAGOGASTRODUODENOSCOPY (EGD) WITH PROPOFOL;  Surgeon: [name], MD;  Location: [place];  Service: Endoscopy;  Laterality: N/A;  . ESOPHAGOGASTRODUODENOSCOPY (EGD) WITH PROPOFOL N/A 03/26/2018   Procedure: ESOPHAGOGASTRODUODENOSCOPY (EGD) WITH PROPOFOL;  Surgeon: Jonathon Bellows, MD;  Location: [facility] ENDOSCOPY;  Service: Gastroenterology;  Laterality: N/A;  . ESOPHAGOGASTRODUODENOSCOPY (EGD) WITH PROPOFOL N/A 04/22/2018   Procedure: ESOPHAGOGASTRODUODENOSCOPY (EGD) WITH PROPOFOL;  Surgeon: [name], MD;  Location: [place];  Service: Endoscopy;  Laterality: N/A;  . ESOPHAGOGASTRODUODENOSCOPY (EGD) WITH PROPOFOL N/A 04/26/2020   Procedure: ESOPHAGOGASTRODUODENOSCOPY (EGD) WITH PROPOFOL;  Surgeon: [name], MD;  Location: [place];  Service: Endoscopy;  Laterality: N/A;  . ESOPHAGOSCOPY WITH DILITATION N/A 04/22/2018   Procedure: ESOPHAGOSCOPY WITH DILITATION;  Surgeon: [name], MD;  Location: [place];  Service: Endoscopy;  Laterality: N/A;  . PEG PLACEMENT N/A 11/28/2015   Procedure: PERCUTANEOUS ENDOSCOPIC GASTROSTOMY (PEG) PLACEMENT;  Surgeon: [name], MD;  Location: [place];  Service: Endoscopy;  Laterality: N/A;  . PEG TUBE PLACEMENT  10/22/12   10/22/12 - [facility], 10/23/14 - [facility], Dr. Allen Norris, replaced  . TONSILLECTOMY     Yolander Goodie 2010--stage IV    SOCIAL HISTORY:   Social History   Tobacco Use  . Smoking status: Current Every Day Smoker    Years: 40.00  . Smokeless tobacco: Never Used  . Tobacco comment: was 2 PPD, now 2 cigs/day  Substance Use Topics  . Alcohol use: No    FAMILY HISTORY:   Family History  Problem Relation Age of Onset  . Pancreatic cancer Mother   . Stroke Father   . Testicular cancer Father   . Suicidality Father     DRUG ALLERGIES:   Allergies  Allergen Reactions  . Morphine And Related Nausea And Vomiting    REVIEW OF  SYSTEMS:   ROS As per history of present illness. All pertinent systems were reviewed above. Constitutional, HEENT, cardiovascular, respiratory, GI, GU, musculoskeletal, neuro, psychiatric, endocrine, integumentary and hematologic systems were reviewed and are otherwise negative/unremarkable except for positive findings mentioned above in the HPI.   MEDICATIONS AT HOME:   Prior to Admission medications   Medication Sig Start Date End Date Taking? Authorizing Provider  albuterol (PROVENTIL HFA;VENTOLIN HFA) 108 (90 BASE) MCG/ACT inhaler Inhale 2 puffs into the lungs every 6 (six) hours as needed for wheezing or shortness of breath.     [provider]  ARIPiprazole (ABILIFY) 2 MG tablet Take 2 mg by mouth daily.     [provider]  aspirin EC 81 MG tablet 81 mg.    [provider]  budesonide-formoterol (SYMBICORT) 160-4.5 MCG/ACT inhaler Inhale into the lungs.    [provider]  buPROPion (WELLBUTRIN SR) 150 MG 12 hr tablet 150 mg.    [provider]  clonazePAM (KLONOPIN) 1 MG tablet Take 1 mg by mouth 2 (two) times daily.    [provider]  DULoxetine (CYMBALTA) 60 MG capsule Take 60 mg by mouth daily.    [provider]  Ipratropium-Albuterol (COMBIVENT) 20-100 MCG/ACT AERS respimat Inhale 2 puffs into the  lungs 2 (two) times daily.    [provider]  mometasone (NASONEX) 50 MCG/ACT nasal spray Place 2 sprays into both nostrils 2 (two) times daily.    [provider]  Multiple Vitamin (MULTIVITAMIN WITH MINERALS) TABS tablet Take 1 tablet by mouth daily. 05/05/18   Loletha Grayer, MD  Nutritional Supplements (FEEDING SUPPLEMENT, NEPRO CARB STEADY,) LIQD Take 237 mLs by mouth 3 (three) times daily between meals. 05/05/18   Loletha Grayer, MD  Oxycodone HCl 20 MG TABS Take 20 mg by mouth 4 (four) times daily.    [provider]  pantoprazole (PROTONIX) 40 MG tablet Take 40 mg by mouth at bedtime.      [provider]  pregabalin (LYRICA) 150 MG capsule Take 1 capsule by mouth 2 (two) times daily. 04/13/18   [provider]  traZODone (DESYREL) 150 MG tablet Take 150 mg by mouth at bedtime.     [provider]  venlafaxine (EFFEXOR) 37.5 MG tablet Take 37.5 mg by mouth 2 (two) times daily with a meal.     [provider]  vitamin C (VITAMIN C) 250 MG tablet Take 1 tablet (250 mg total) by mouth 2 (two) times daily. 05/05/18   Loletha Grayer, MD      VITAL SIGNS:  Blood pressure 137/61, pulse 85, temperature (!) 96.6 F (35.9 C), resp. rate 18, height 6' (1.829 m), weight 54.4 kg, SpO2 91 %.  PHYSICAL EXAMINATION:  Physical Exam  GENERAL:  64 y.o.-year-old Caucasian male patient lying in the bed with no acute distress.  EYES: Pupils equal, round, reactive to light and accommodation. No scleral icterus. Extraocular muscles intact.  HEENT: Head atraumatic, normocephalic. Oropharynx and nasopharynx clear.  NECK:  Supple, no jugular venous distention. No thyroid enlargement, no tenderness.  LUNGS: Diminished bibasal breath sounds with bibasal crackles. CARDIOVASCULAR: Regular rate and rhythm, S1, S2 normal. No murmurs, rubs, or gallops.  ABDOMEN: Soft, nondistended, nontender. Bowel sounds present. No organomegaly or mass.  EXTREMITIES: No pedal edema, cyanosis, or clubbing.  NEUROLOGIC: Cranial nerves II through XII are intact. Muscle strength 5/5 in all extremities. Sensation intact. Gait not checked.  PSYCHIATRIC: The patient is alert and oriented x 3.  Normal affect and good eye contact. SKIN: No obvious rash, lesion, or ulcer.   LABORATORY PANEL:   CBC Recent Labs  Lab 08/13/20 1145  WBC 14.9*  HGB 11.0*  HCT 35.4*  PLT 218   ------------------------------------------------------------------------------------------------------------------  Chemistries  Recent Labs  Lab 08/13/20 1145  NA 133*  K 5.0  CL 94*  CO2 33*  GLUCOSE 110*   BUN 12  CREATININE 0.65  CALCIUM 8.9   ------------------------------------------------------------------------------------------------------------------  Cardiac Enzymes No results for input(s): TROPONINI in the last 168 hours. ------------------------------------------------------------------------------------------------------------------  RADIOLOGY:  DG Chest 2 View  Result Date: 08/13/2020 CLINICAL DATA:  Shortness of breath. EXAM: CHEST - 2 VIEW COMPARISON:  Chest x-ray 07/29/2020 and chest CT 07/29/2020 FINDINGS: The cardiac silhouette, mediastinal hilar contours are within normal limits and stable. Stable mild tortuosity and calcification of the thoracic aorta. Stable underlying significant chronic lung disease with emphysema and pulmonary scarring. Multifocal patchy lung opacities are again demonstrated. No new findings. IMPRESSION: Significant chronic underlying lung disease and persistent multifocal patchy lung opacities. Electronically Signed   By: Marijo Sanes M.D.   On: 08/13/2020 12:24      IMPRESSION AND PLAN:  1.  Recurrent likely aspiration pneumonia given associated dysphagia. -The patient will be admitted to a medical monitored bed. -We  will continue antibiotic treatment with IV Unasyn and Zithromax. -Mucolytic therapy will be provided as well as bronchodilator therapy. -We will follow blood and sputum culture and check pneumonia antigens. -She will be placed on thickened nectar liquids.  2.  Hypotension hyponatremia likely hypovolemic. -The patient will be hydrated with IV normal saline and will follow his BMP.  3.  COPD without acute exacerbation with persistent tobacco abuse. -He will be on bronchodilator therapy on a scheduled and as needed basis with DuoNeb's and will hold off Symbicort. -I counseled him for smoking cessation and he received further counseling here.  3.  Depression and anxiety. -Continue Wellbutrin XL, Abilify, Cymbalta and  Klonopin.  4.  GERD. -PPI therapy will be resumed.  5.  Peripheral neuropathy. -We will continue Lyrica.  6.  DVT prophylaxis. -Subcutaneous Lovenox.   All the records are reviewed and case discussed with ED provider. The plan of care was discussed in details with the patient (and family). I answered all questions. The patient agreed to proceed with the above mentioned plan. Further management will depend upon hospital course.   CODE STATUS: Full code  Status is: Inpatient  Remains inpatient appropriate because:Ongoing diagnostic testing needed not appropriate for outpatient work up, Unsafe d/c plan, IV treatments appropriate due to intensity of illness or inability to take PO and Inpatient level of care appropriate due to severity of illness   Dispo: The patient is from: Home              Anticipated d/c is to: Home              Anticipated d/c date is: 2 days              Patient currently is not medically stable to d/c.      TOTAL TIME TAKING CARE OF THIS PATIENT: 55 minutes.    Christel Mormon M.D on 08/13/2020 at 11:28 PM  [facility]   From 7 PM-7 AM, contact night-coverage [email or web address]  CC: Primary care physician; Secundino Ginger, PA-C

## 2020-08-13 NOTE — ED Triage Notes (Signed)
C/O sob and blurred vision since last night.  Wife states patient's symptoms remind her of when patient has aspirated / pneumonia.  Awake and alert.  No SOB/ DOE.  NAD  Wears 2L/ [facility] home oxygen at night, has been wearing oxygen all of the time for two weeks since discharge from hospital.

## 2020-08-13 NOTE — Progress Notes (Signed)
PHARMACY -  BRIEF ANTIBIOTIC NOTE   Pharmacy has received consult(s) for Cefepime, Vancomycin from an ED provider.  The patient's profile has been reviewed for ht/wt/allergies/indication/available labs.    One time order(s) placed for Cefepime 2 gm IV x 1 and Vancomycin 1250 mg IV X 1   Further antibiotics/pharmacy consults should be ordered by admitting physician if indicated.                       Thank you, Lenix Kidd D 08/13/2020  6:48 PM

## 2020-08-13 NOTE — ED Provider Notes (Signed)
[facility] Emergency Department Provider Note  ____________________________________________   I have reviewed the triage vital signs and the nursing notes.   HISTORY  Chief Complaint Shortness of Breath   History limited by: Not Limited   HPI Mark [name] is a 64 y.o. male who presents to the emergency department today because of concerns for shortness of breath and dizziness.  The patient states he noticed it this morning when he got up to try to go outside.  He states that his vision went white and he had to sit down.  Patient has had some cough but denies any chest pain.  Denies any fevers or chills.  States he had similar symptoms a couple weeks ago when he was admitted the hospital for pneumonia.  He has been on his oxygen since discharge from that hospitalization although he denies going up on his oxygen today.  Records reviewed. Per medical record review patient has a history of admission to the hospital secondary to multifocal pneumonia.   Past Medical History:  Diagnosis Date  . Arthritis    left hand  . Aspiration pneumonia (HCC)   . Cancer (HCC)    tonsilal ca  . COPD (chronic obstructive pulmonary disease) (HCC)   . Incontinence    night time, began recently  . Memory deficit 11/18/2014  . PEG tube 11/18/2014   removed summer 2017  . Peripheral neuropathy 11/18/2014  . Smokers' cough (HCC)   . Tonsillar cancer, S/P surgery, chemotherapy XRT 2009-followed at [facility] 11/18/2014  . Wears dentures    full upper and lower    Patient Active Problem List   Diagnosis Date Noted  . Chronic respiratory failure (HCC) 07/29/2020  . Dysphagia   . Palliative care by specialist   . Protein-calorie malnutrition, severe 09/10/2017  . Esophageal candidiasis (HCC)   . Esophageal dysphagia   . Palliative care encounter   . Severe sepsis with septic shock (HCC) 04/06/2017  . Multifocal pneumonia 04/06/2017  . Lactic acidosis 04/06/2017  . Acute  respiratory failure with hypoxia (HCC) 04/06/2017  . Septic shock (HCC) 04/06/2017  . Obstruction of esophagus   . 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 Bronchiectasis (HCC) 03/26/2016  . Stricture and stenosis of esophagus   . Gastrostomy status (HCC)   . COPD (chronic obstructive pulmonary disease) (HCC) 12/19/2015  . Tobacco dependency 12/19/2015  . 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 Problems with swallowing and mastication   . Mechanical complication of gastrostomy (HCC)   . Gastritis   . Excessive urination at night 05/30/2015  . Goals of care, counseling/discussion 05/30/2015  . FOM (frequency of micturition) 05/30/2015  . Severe sepsis (HCC) 04/23/2015  . Aspiration pneumonia (HCC) 11/18/2014  . Tonsillar cancer, S/P surgery, chemotherapy XRT 2009-followed at [facility] 11/18/2014  . Peripheral neuropathy 11/18/2014  . Adult failure to thrive 11/18/2014  . Recurrent aspiration pneumonia (HCC) 11/18/2014  . Memory deficit 11/18/2014  . PEG tube  11/18/2014    Past Surgical History:  Procedure Laterality Date  . APPENDECTOMY    . CHOLECYSTECTOMY  2007  . ESOPHAGEAL DILATION N/A 08/01/2016   Procedure: ESOPHAGEAL DILATION;  Surgeon: Midge Minium, MD;  Location: [facility];  Service: Endoscopy;  Laterality: N/A;  . ESOPHAGEAL DILATION N/A 02/19/2017   Procedure: ESOPHAGEAL DILATION;  Surgeon: Midge Minium, MD;  Location: [facility];  Service: Endoscopy;  Laterality: N/A;  . ESOPHAGEAL DILATION N/A 08/27/2017   Procedure: ESOPHAGEAL DILATION;  Surgeon: Midge Minium, MD;  Location: [place];  Service: Endoscopy;  Laterality: N/A;  . ESOPHAGEAL DILATION  04/26/2020   Procedure: ESOPHAGEAL DILATION;  Surgeon: [name], MD;  Location: [place];  Service: Endoscopy;;  . ESOPHAGOGASTRODUODENOSCOPY (EGD) WITH PROPOFOL N/A 01/24/2016   Procedure: ESOPHAGOGASTRODUODENOSCOPY (EGD) WITH gastric biopsy and esophageal dilation.;   Surgeon: [name], MD;  Location: [place];  Service: Endoscopy;  Laterality: N/A;  . ESOPHAGOGASTRODUODENOSCOPY (EGD) WITH PROPOFOL N/A 08/01/2016   Procedure: ESOPHAGOGASTRODUODENOSCOPY (EGD) WITH PROPOFOL;  Surgeon: [name], MD;  Location: [place];  Service: Endoscopy;  Laterality: N/A;  . ESOPHAGOGASTRODUODENOSCOPY (EGD) WITH PROPOFOL N/A 02/19/2017   Procedure: ESOPHAGOGASTRODUODENOSCOPY (EGD) WITH PROPOFOL;  Surgeon: [name], MD;  Location: [place];  Service: Endoscopy;  Laterality: N/A;  . ESOPHAGOGASTRODUODENOSCOPY (EGD) WITH PROPOFOL N/A 08/27/2017   Procedure: ESOPHAGOGASTRODUODENOSCOPY (EGD) WITH PROPOFOL;  Surgeon: [name], MD;  Location: [place];  Service: Endoscopy;  Laterality: N/A;  . ESOPHAGOGASTRODUODENOSCOPY (EGD) WITH PROPOFOL N/A 03/26/2018   Procedure: ESOPHAGOGASTRODUODENOSCOPY (EGD) WITH PROPOFOL;  Surgeon: Jonathon Bellows, MD;  Location: [facility];  Service: Gastroenterology;  Laterality: N/A;  . ESOPHAGOGASTRODUODENOSCOPY (EGD) WITH PROPOFOL N/A 04/22/2018   Procedure: ESOPHAGOGASTRODUODENOSCOPY (EGD) WITH PROPOFOL;  Surgeon: [name], MD;  Location: [place];  Service: Endoscopy;  Laterality: N/A;  . ESOPHAGOGASTRODUODENOSCOPY (EGD) WITH PROPOFOL N/A 04/26/2020   Procedure: ESOPHAGOGASTRODUODENOSCOPY (EGD) WITH PROPOFOL;  Surgeon: [name], MD;  Location: [place];  Service: Endoscopy;  Laterality: N/A;  . ESOPHAGOSCOPY WITH DILITATION N/A 04/22/2018   Procedure: ESOPHAGOSCOPY WITH DILITATION;  Surgeon: [name], MD;  Location: [place];  Service: Endoscopy;  Laterality: N/A;  . PEG PLACEMENT N/A 11/28/2015   Procedure: PERCUTANEOUS ENDOSCOPIC GASTROSTOMY (PEG) PLACEMENT;  Surgeon: [name], MD;  Location: [place];  Service: Endoscopy;  Laterality: N/A;  . PEG TUBE PLACEMENT  10/22/12   10/22/12 - [facility], 10/23/14 - [facility], Dr. Allen Norris, replaced  . TONSILLECTOMY     jan  2010--stage IV    Prior to Admission medications   Medication Sig Start Date End Date Taking? Authorizing Provider  albuterol (PROVENTIL HFA;VENTOLIN HFA) 108 (90 BASE) MCG/ACT inhaler Inhale 2 puffs into the lungs every 6 (six) hours as needed for wheezing or shortness of breath.     [provider]  ARIPiprazole (ABILIFY) 2 MG tablet Take 2 mg by mouth daily.     [provider]  aspirin EC 81 MG tablet 81 mg.    [provider]  budesonide-formoterol (SYMBICORT) 160-4.5 MCG/ACT inhaler Inhale into the lungs.    [provider]  buPROPion (WELLBUTRIN SR) 150 MG 12 hr tablet 150 mg.    [provider]  clonazePAM (KLONOPIN) 1 MG tablet Take 1 mg by mouth 2 (two) times daily.    [provider]  DULoxetine (CYMBALTA) 60 MG capsule Take 60 mg by mouth daily.    [provider]  Ipratropium-Albuterol (COMBIVENT) 20-100 MCG/ACT AERS respimat Inhale 2 puffs into the lungs 2 (two) times daily.    [provider]  mometasone (NASONEX) 50 MCG/ACT nasal spray Place 2 sprays into both nostrils 2 (two) times daily.    [provider]  Multiple Vitamin (MULTIVITAMIN WITH MINERALS) TABS tablet Take 1 tablet by mouth daily. 05/05/18   Loletha Grayer, MD  Nutritional Supplements (FEEDING SUPPLEMENT, NEPRO CARB STEADY,) LIQD Take 237 mLs by mouth 3 (three) times daily between meals. 05/05/18   Loletha Grayer, MD  Oxycodone HCl 20 MG TABS Take  20 mg by mouth 4 (four) times daily.    [provider]  pantoprazole (PROTONIX) 40 MG tablet Take 40 mg by mouth at bedtime.     [provider]  pregabalin (LYRICA) 150 MG capsule Take 1 capsule by mouth 2 (two) times daily. 04/13/18   [provider]  traZODone (DESYREL) 150 MG tablet Take 150 mg by mouth at bedtime.     [provider]  venlafaxine (EFFEXOR) 37.5 MG tablet Take 37.5 mg by mouth 2 (two) times daily with a meal.     [provider]  vitamin C (VITAMIN C) 250 MG tablet Take 1 tablet (250 mg total) by mouth 2 (two) times daily. 05/05/18   Loletha Grayer, MD    Allergies Morphine and related  Family History  Problem Relation Age of Onset  . Pancreatic cancer Mother   . Stroke Father   . Testicular cancer Father   . Suicidality Father     Social History Social History   Tobacco Use  . Smoking status: Current Every Day Smoker    Years: 40.00  . Smokeless tobacco: Never Used  . Tobacco comment: was 2 PPD, now 2 cigs/day  Vaping Use  . Vaping Use: Never used  Substance Use Topics  . Alcohol use: No  . Drug use: No    Review of Systems Constitutional: No fever/chills Eyes: No visual changes. ENT: No sore throat. Cardiovascular: Denies chest pain. Respiratory: Positive for shortness of breath. Gastrointestinal: No abdominal pain.  No nausea, no vomiting.  No diarrhea.   Genitourinary: Negative for dysuria. Musculoskeletal: Negative for back pain. Skin: Negative for rash. Neurological: Positive for dizziness. ____________________________________________   PHYSICAL EXAM:  VITAL SIGNS: ED Triage Vitals  Enc Vitals Group     BP 08/13/20 1133 (!) 90/55     Pulse Rate 08/13/20 1133 82     Resp 08/13/20 1133 16     Temp 08/13/20 1133 98.5 F (36.9 C)     Temp Source 08/13/20 1133 Oral     SpO2 08/13/20 1133 97 %     Weight 08/13/20 1131 119 lb 14.9 oz (54.4 kg)     Height 08/13/20 1131 6' (1.829 m)     Head Circumference --      Peak Flow --      Pain Score 08/13/20 1131 0   Constitutional: Alert and oriented.  Eyes: Conjunctivae are normal.  ENT      Head: Normocephalic and atraumatic.      Nose: No congestion/rhinnorhea.      Mouth/Throat: Mucous membranes are moist.      Neck: No stridor. Hematological/Lymphatic/Immunilogical: No cervical lymphadenopathy. Cardiovascular: Normal rate, regular rhythm.  No murmurs, rubs, or gallops.  Respiratory: Slightly increased work of breathing,  some wheezing. Gastrointestinal: Soft and non tender. No rebound. No guarding.  Genitourinary: Deferred Musculoskeletal: Normal range of motion in all extremities. No lower extremity edema. Neurologic:  Normal speech and language. No gross focal neurologic deficits are appreciated.  Skin:  Skin is warm, dry and intact. No rash noted. Psychiatric: Mood and affect are normal. Speech and behavior are normal. Patient exhibits appropriate insight and judgment.  ____________________________________________    LABS (pertinent positives/negatives)  BMP na 133, k 5.0, cl 94, glu 110, cr 0.65 CBC wbc 14.9, hgb 11.0, plt 218 COVID negative Trop hs 8 BNP 187.4 Lactic 1.1 Procalcitonin <0.10 ____________________________________________   EKG  I, Nance Pear, attending physician, personally viewed and interpreted this EKG  EKG Time:  1143 Rate: 78 Rhythm: normal sinus rhythm Axis: normal Intervals: qtc 440 QRS: narrow ST changes: no st elevation Impression: normal ekg  ____________________________________________    RADIOLOGY  CXR Underlying chronic lung disease with patchy bilateral airspace opacities  ____________________________________________   PROCEDURES  Procedures  ____________________________________________   INITIAL IMPRESSION / ASSESSMENT AND PLAN / ED COURSE  Pertinent labs & imaging results that were available during my care of the patient were reviewed by me and considered in my medical decision making (see chart for details).   Patient presented to the emergency department today because of concerns for increased shortness of breath and some lightheadedness.  The patient had a recent admission to the hospital secondary to pneumonia.  Chest x-ray today is again concerning for possible pneumonia.  Patient did have an elevated white count.  Did have concerns for possible Covid however when it turned negative patient was given IV antibiotics.  Will plan on  admission.  Discussed finding and plan with patient.  ____________________________________________   FINAL CLINICAL IMPRESSION(S) / ED DIAGNOSES  Final diagnoses:  Pneumonia due to infectious organism, unspecified laterality, unspecified part of lung     Note: This dictation was prepared with Dragon dictation. Any transcriptional errors that result from this process are unintentional     Nance Pear, MD 08/13/20 2118

## 2020-08-14 ENCOUNTER — Inpatient Hospital Stay: Payer: Medicaid Other

## 2020-08-14 DIAGNOSIS — G8929 Other chronic pain: Secondary | ICD-10-CM

## 2020-08-14 DIAGNOSIS — H539 Unspecified visual disturbance: Secondary | ICD-10-CM

## 2020-08-14 DIAGNOSIS — J690 Pneumonitis due to inhalation of food and vomit: Secondary | ICD-10-CM | POA: Diagnosis not present

## 2020-08-14 DIAGNOSIS — R531 Weakness: Secondary | ICD-10-CM | POA: Diagnosis not present

## 2020-08-14 DIAGNOSIS — I959 Hypotension, unspecified: Secondary | ICD-10-CM | POA: Diagnosis not present

## 2020-08-14 DIAGNOSIS — E871 Hypo-osmolality and hyponatremia: Secondary | ICD-10-CM

## 2020-08-14 LAB — BASIC METABOLIC PANEL
Anion gap: 6 (ref 5–15)
BUN: 9 mg/dL (ref 8–23)
CO2: 33 mmol/L — ABNORMAL HIGH (ref 22–32)
Calcium: 8.5 mg/dL — ABNORMAL LOW (ref 8.9–10.3)
Chloride: 105 mmol/L (ref 98–111)
Creatinine, Ser: 0.61 mg/dL (ref 0.61–1.24)
GFR, Estimated: >60 mL/min (ref >60)
Glucose, Bld: 63 mg/dL — ABNORMAL LOW (ref 70–99)
Potassium: 4.2 mmol/L (ref 3.5–5.1)
Sodium: 144 mmol/L (ref 135–145)

## 2020-08-14 LAB — CBC
HCT: 29 % — ABNORMAL LOW (ref 39.0–52.0)
Hemoglobin: 9.1 g/dL — ABNORMAL LOW (ref 13.0–17.0)
MCH: 29.9 pg (ref 26.0–34.0)
MCHC: 31.4 g/dL (ref 30.0–36.0)
MCV: 95.4 fL (ref 80.0–100.0)
Platelets: 158 10*3/uL (ref 150–400)
RBC: 3.04 MIL/uL — ABNORMAL LOW (ref 4.22–5.81)
RDW: 14.1 % (ref 11.5–15.5)
WBC: 5.4 10*3/uL (ref 4.0–10.5)
nRBC: 0 % (ref 0.0–0.2)

## 2020-08-14 LAB — HEMOGLOBIN A1C
Hgb A1c MFr Bld: 5.5 % (ref 4.8–5.6)
Mean Plasma Glucose: 111.15 mg/dL

## 2020-08-14 LAB — GLUCOSE, CAPILLARY
Glucose-Capillary: 145 mg/dL — ABNORMAL HIGH (ref 70–99)
Glucose-Capillary: 161 mg/dL — ABNORMAL HIGH (ref 70–99)
Glucose-Capillary: 104 mg/dL — ABNORMAL HIGH (ref 70–99)

## 2020-08-14 MED ORDER — GADOBUTROL 1 MMOL/ML IV SOLN
5.0000 mL | Freq: Once | INTRAVENOUS | Status: AC | PRN
  Administered 2020-08-14: 12:00:00 5 mL via INTRAVENOUS

## 2020-08-14 MED ORDER — LORAZEPAM 2 MG/ML IJ SOLN
0.5000 mg | Freq: Once | INTRAMUSCULAR | Status: AC | PRN
  Administered 2020-08-14: 11:00:00 0.5 mg via INTRAVENOUS
  Filled 2020-08-14: qty 1

## 2020-08-14 MED ORDER — NICOTINE 21 MG/24HR TD PT24
21.0000 mg | MEDICATED_PATCH | Freq: Every day | TRANSDERMAL | Status: DC
  Administered 2020-08-14 – 2020-08-16 (×3): 21 mg via TRANSDERMAL
  Filled 2020-08-14 (×3): qty 1

## 2020-08-14 MED ORDER — OXYCODONE HCL 5 MG PO TABS
20.0000 mg | ORAL_TABLET | Freq: Every day | ORAL | Status: DC
  Administered 2020-08-14 – 2020-08-16 (×11): 20 mg via ORAL
  Filled 2020-08-14 (×11): qty 4

## 2020-08-14 MED ORDER — MIDODRINE HCL 5 MG PO TABS
5.0000 mg | ORAL_TABLET | Freq: Three times a day (TID) | ORAL | Status: DC
  Administered 2020-08-14 – 2020-08-16 (×5): 5 mg via ORAL
  Filled 2020-08-14 (×5): qty 1

## 2020-08-14 MED ORDER — NEPRO/CARBSTEADY PO LIQD
237.0000 mL | Freq: Two times a day (BID) | ORAL | Status: DC
  Administered 2020-08-15 – 2020-08-16 (×3): 237 mL via ORAL

## 2020-08-14 MED ORDER — INSULIN ASPART 100 UNIT/ML ~~LOC~~ SOLN: 0.0000 [IU] | Freq: Every day | SUBCUTANEOUS | Status: DC

## 2020-08-14 MED ORDER — INSULIN ASPART 100 UNIT/ML ~~LOC~~ SOLN
0.0000 [IU] | Freq: Three times a day (TID) | SUBCUTANEOUS | Status: DC
  Administered 2020-08-14: 14:00:00 1 [IU] via SUBCUTANEOUS
  Administered 2020-08-14: 17:00:00 2 [IU] via SUBCUTANEOUS
  Filled 2020-08-14 (×2): qty 1

## 2020-08-14 NOTE — Progress Notes (Signed)
Inpatient Diabetes Program Recommendations  AACE/ADA: New Consensus Statement on Inpatient Glycemic Control (2015)  Target Ranges:  Prepandial:   less than 140 mg/dL      Peak postprandial:   less than 180 mg/dL (1-2 hours)      Critically ill patients:  140 - 180 mg/dL   Lab Results  Component Value Date   GLUCAP 147 (H) 04/09/2017   HGBA1C 6.1 (H) 04/06/2017    Review of Glycemic Control Results for Mark [name], Mark [name] (MRN [id number]) as of 08/14/2020 09:36  Ref. Range 08/14/2020 06:57  Glucose Latest Ref Range: 70 - 99 mg/dL 63 (L)   Current orders for Inpatient glycemic control: none  Inpatient Diabetes Program Recommendations:    Glucose serum 63 mg/dL. May want to consider adding CBGs TID.   Thanks, Lujean Rave, MSN, RNC-OB Diabetes Coordinator [phone] (8a-5p)

## 2020-08-14 NOTE — Progress Notes (Addendum)
SLP Cancellation Note  Patient Details Name: Mark [name] MRN: [id number] DOB: 04/28/1956   Cancelled treatment:       Reason Eval/Treat Not Completed: Medical issues which prohibited therapy (chart reviewed; consulted MD re: pt's status)  Pt is has a known history of tonsillar cancer s/p radiation w/ esophageal stenosis, recurrent esophageal dilations who in the past he had a feeding tube for ~2+ years while undergoing the treatment but then had it removed per his request in order to continue w/ an oral diet only. Pt is admitted for concern for aspiration pneumonia; recent admit ~2 weeks ago for same.  Pt does have long-standing baseline pharyngeal phase dysphagia w/ increased risk for aspiration s/p tonsillar/throat Ca w/ surgery and XRT (and late effects of XRT) - this has been identified in multiple MBSSs/assessments in the past 5 years. Recommendations by ST services have been for NPO status w/ pt taking pleasure bites of Puree after thorough oral care, frequent oral care to reduce oral bacteria, and to maintain an OM and pharyngeal swallowing exercise POC at home. He attempted f/u as an Outpatient briefly. Primary nutrition/hydration was recommended to be by alternative means - PEG TFs/flushes. Pt has alsways expressed his thoughts on wanting to eat/drink and stated he "just can't stop altogether" in the past. Pt is at HIGH risk for continual aspiration of any oral intake thus at HIGH risk for aspiration, pneumonia.  MD has ordered a dysphaiga level 2 diet w/ Nectar liquids for pt this admit and consulted ST services. The above and ST's recommendations were discussed w/ MD. No skilled ST services are indicated as pt is at his Baseline of swallowing, dysphagia and has been recommended NPO status by ST d/t the risk for aspiration, pneumonia. Pt is also at risk for Regurgitation of food/liquid material d/t Esophageal phase deficits/dysmotility. Recommend continued f/u w/ GI; frequent oral care for  hygiene and stimulation of swallowing. Pt can seek Outpatient referral from his PCP for potential re-evaluation of swallowing status/dysphagia tx if he wishes post d/c home when medically stable. MD agreed.       Jerilynn Som, MS, CCC-SLP Speech Language Pathologist Rehab Services [phone] [facility] 08/14/2020, 2:54 PM

## 2020-08-14 NOTE — Progress Notes (Signed)
Initial Nutrition Assessment  DOCUMENTATION CODES:   Severe malnutrition in context of chronic illness,Underweight  INTERVENTION:   Nepro Shake po BID, each supplement provides 425 kcal and 19 grams protein  Magic cup BID with lunch and dinner, each supplement provides 290 kcal and 9 grams of protein  MVI with minerals   NUTRITION DIAGNOSIS:   Severe Malnutrition related to chronic illness (COPD, tonsillar CA s/p tonsillectomy chemo/XRT, dysphagia) as evidenced by severe muscle depletion,severe fat depletion.  GOAL:   Patient will meet greater than or equal to 90% of their needs  MONITOR:   PO intake,Supplement acceptance,Labs,Weight trends  REASON FOR ASSESSMENT:   Malnutrition Screening Tool    ASSESSMENT:   64 y.o. male with PMH of COPD, depression, tonsillar cancer s/p tonsillectomy and chemo/XRT, hx of PEG tube placed 11/2014 s/p removal in 2017. Recently admitted for acute respiratory failure secondary to multifocal PNA. Presented with worsening SOB, dizziness, and cough with brown sputum production. Admitted for recurrent aspiration PNA.  Spoke with pt at bedside. Pt reports his appetite is good. He consumes 3 meals a day, prepared by his wife. He minces his food at home and thickens all liquids with ThickenUp powder. Pt reports he does not have trouble chewing and swallowing. Reports he wears dentures and has no problems with these. Pt reports his appetite is good during this admission. Pt denies drinking protein drinks at home. He reports drinking Nepro only during hospital admissions and likes it. Pt also reports he enjoys Biochemist, clinical. Pt used to have a G-tube for feeding but was removed in 2017. Pt reports he did not like using his PEG for nutrition because it was inconvenient and made his stomach hurt. Pt stated he does not wants another PEG tube.   Pt reports he has lost weight but did know how much. Pt claims he has stopped tracking his weight because it makes him  paranoid. Pt states he eats good and the weight continues to "fall off". He states he is usually around 120 lbs. Per chart review, pt weight appears to be stable for the past two years.   Pt states he is able to ambulate at home without the assistant devices.   Labs reviewed.   Medications reviewed and include: Vitamin C, Cymbalta, Desyrel, Effexor,  SSI, MVI with minerals, Protonix   NUTRITION - FOCUSED PHYSICAL EXAM:  Flowsheet Row Most Recent Value  Orbital Region Severe depletion  Upper Arm Region Severe depletion  Thoracic and Lumbar Region Severe depletion  Buccal Region Severe depletion  Temple Region Severe depletion  Clavicle Bone Region Severe depletion  Clavicle and Acromion Bone Region Severe depletion  Scapular Bone Region Severe depletion  Dorsal Hand Severe depletion  Patellar Region Severe depletion  Anterior Thigh Region Severe depletion  Posterior Calf Region Severe depletion  Edema (RD Assessment) None  Hair Reviewed  Eyes Reviewed  Mouth Reviewed  Skin Reviewed  Nails Reviewed       Diet Order:   Diet Order            DIET DYS 2 Room service appropriate? Yes; Fluid consistency: Nectar Thick  Diet effective now                 EDUCATION NEEDS:   No education needs have been identified at this time  Skin:  Skin Assessment: Reviewed RN Assessment  Last BM:  unknown  Height:   Ht Readings from Last 1 Encounters:  08/13/20 6' (1.829 m)    Weight:  Wt Readings from Last 1 Encounters:  08/13/20 54.4 kg    Ideal Body Weight:  80.9 kg  BMI:  Body mass index is 16.27 kg/m.  Estimated Nutritional Needs:   Kcal:  1700-1900 kcal  Protein:  85-95 grams  Fluid:  >1.7 L/day   Ronnald Nian, Dietetic Intern Pager: [phone] If unavailable: [phone]

## 2020-08-14 NOTE — Progress Notes (Signed)
Writer had patient on room air since 1040 am and sats 94%, 1445 writer in to do orthostatic VS and patient was 79% on room air and writer placed patient back on O2 2L [place] now 94-98%.

## 2020-08-14 NOTE — Progress Notes (Signed)
Patient ID: BURTON DANZIGER, male   DOB: 1955/10/06, 64 y.o.   MRN: [id number] [facility] PROGRESS NOTE  REDDINGTON GRIECO [id number] DOB: 1956-04-05 DOA: 08/13/2020 PCP: Secundino Ginger, PA-C  HPI/Subjective: Patient states that he has been seeing whitish with both of his eyes.  He states that his balance has also been off and has been bouncing off the walls.  Feeling very weak and sleeping a lot.  Was recently admitted for aspiration pneumonia.  Objective: Vitals:   08/14/20 0417 08/14/20 0742  BP: 119/70 103/65  Pulse: 76   Resp: 18 20  Temp: 98.5 F (36.9 C) 98.5 F (36.9 C)  SpO2: 98%     Intake/Output Summary (Last 24 hours) at 08/14/2020 1418 Last data filed at 08/14/2020 0700 Gross per 24 hour  Intake 1348.71 ml  Output 200 ml  Net 1148.71 ml   Filed Weights   08/13/20 1131  Weight: 54.4 kg    ROS: Review of Systems  Constitutional: Negative for malaise/fatigue.  Respiratory: Positive for cough and shortness of breath.   Cardiovascular: Negative for chest pain.  Gastrointestinal: Negative for abdominal pain, nausea and vomiting.  Neurological: Positive for dizziness.   Exam: Physical Exam HENT:     Head: Normocephalic.     Mouth/Throat:     Pharynx: No oropharyngeal exudate.  Eyes:     General: Lids are normal.     Conjunctiva/sclera: Conjunctivae normal.     Pupils: Pupils are equal, round, and reactive to light.     Comments: Visual acuity 20/50 left eye 20/200 right eye.  Cardiovascular:     Rate and Rhythm: Normal rate and regular rhythm.     Heart sounds: S1 normal and S2 normal. Murmur heard.   Systolic murmur is present with a grade of 2/6.   Pulmonary:     Breath sounds: Examination of the right-lower field reveals decreased breath sounds and rhonchi. Examination of the left-lower field reveals decreased breath sounds and rhonchi. Decreased breath sounds and rhonchi present. No wheezing or rales.  Abdominal:     Palpations: Abdomen is  soft.     Tenderness: There is no abdominal tenderness.  Musculoskeletal:     Right lower leg: No swelling.     Left lower leg: No swelling.  Skin:    General: Skin is warm.     Findings: No rash.  Neurological:     Mental Status: He is alert and oriented to person, place, and time.       Data Reviewed: Basic Metabolic Panel: Recent Labs  Lab 08/13/20 1145 08/14/20 0657  NA 133* 144  K 5.0 4.2  CL 94* 105  CO2 33* 33*  GLUCOSE 110* 63*  BUN 12 9  CREATININE 0.65 0.61  CALCIUM 8.9 8.5*   CBC: Recent Labs  Lab 08/13/20 1145 08/14/20 0657  WBC 14.9* 5.4  HGB 11.0* 9.1*  HCT 35.4* 29.0*  MCV 95.2 95.4  PLT 218 158   BNP (last 3 results) Recent Labs    07/29/20 0900 08/13/20 1700  BNP 216.1* 187.4*      Recent Results (from the past 240 hour(s))  Blood culture (routine x 2)     Status: None (Preliminary result)   Collection Time: 08/13/20  4:28 PM   Specimen: BLOOD  Result Value Ref Range Status   Specimen Description BLOOD LEFT ANTECUBITAL  Final   Special Requests   Final    BOTTLES DRAWN AEROBIC AND ANAEROBIC Blood Culture adequate volume  Culture   Final    NO GROWTH < 24 HOURS Performed at [facility], [address] [place], [place] [address]    Report Status PENDING  Incomplete  Resp Panel by RT-PCR (Flu A&B, Covid) Nasopharyngeal Swab     Status: None   Collection Time: 08/13/20  4:44 PM   Specimen: Nasopharyngeal Swab; Nasopharyngeal(NP) swabs in vial transport medium  Result Value Ref Range Status   SARS Coronavirus 2 by RT PCR NEGATIVE NEGATIVE Final    Comment: (NOTE) SARS-CoV-2 target nucleic acids are NOT DETECTED.  The SARS-CoV-2 RNA is generally detectable in upper respiratory specimens during the acute phase of infection. The lowest concentration of SARS-CoV-2 viral copies this assay can detect is 138 copies/mL. A negative result does not preclude SARS-Cov-2 infection and should not be used as the sole basis for  treatment or other patient management decisions. A negative result may occur with  improper specimen collection/handling, submission of specimen other than nasopharyngeal swab, presence of viral mutation(s) within the areas targeted by this assay, and inadequate number of viral copies(<138 copies/mL). A negative result must be combined with clinical observations, patient history, and epidemiological information. The expected result is Negative.  Fact Sheet for Patients:  EntrepreneurPulse.com.au  Fact Sheet for Healthcare Providers:  IncredibleEmployment.be  This test is no t yet approved or cleared by the [place] FDA and  has been authorized for detection and/or diagnosis of SARS-CoV-2 by FDA under an Emergency Use Authorization (EUA). This EUA will remain  in effect (meaning this test can be used) for the duration of the COVID-19 declaration under Section 564(b)(1) of the Act, 21 U.S.C.section 360bbb-3(b)(1), unless the authorization is terminated  or revoked sooner.       Influenza A by PCR NEGATIVE NEGATIVE Final   Influenza B by PCR NEGATIVE NEGATIVE Final    Comment: (NOTE) The Xpert Xpress SARS-CoV-2/FLU/RSV plus assay is intended as an aid in the diagnosis of influenza from Nasopharyngeal swab specimens and should not be used as a sole basis for treatment. Nasal washings and aspirates are unacceptable for Xpert Xpress SARS-CoV-2/FLU/RSV testing.  Fact Sheet for Patients: EntrepreneurPulse.com.au  Fact Sheet for Healthcare Providers: IncredibleEmployment.be  This test is not yet approved or cleared by the [place] FDA and has been authorized for detection and/or diagnosis of SARS-CoV-2 by FDA under an Emergency Use Authorization (EUA). This EUA will remain in effect (meaning this test can be used) for the duration of the COVID-19 declaration under Section 564(b)(1) of the Act, 21  U.S.C. section 360bbb-3(b)(1), unless the authorization is terminated or revoked.  Performed at [facility], [address] [place], [place] [address]   Blood culture (routine x 2)     Status: None (Preliminary result)   Collection Time: 08/13/20  4:44 PM   Specimen: BLOOD  Result Value Ref Range Status   Specimen Description BLOOD BLOOD LEFT WRIST  Final   Special Requests   Final    BOTTLES DRAWN AEROBIC AND ANAEROBIC Blood Culture adequate volume   Culture   Final    NO GROWTH < 12 HOURS Performed at [facility], [address], [place],  [address]    Report Status PENDING  Incomplete     Studies: DG Chest 2 View  Result Date: 08/13/2020 CLINICAL DATA:  Shortness of breath. EXAM: CHEST - 2 VIEW COMPARISON:  Chest x-ray 07/29/2020 and chest CT 07/29/2020 FINDINGS: The cardiac silhouette, mediastinal hilar contours are within normal limits and stable. Stable  mild tortuosity and calcification of the thoracic aorta. Stable underlying significant chronic lung disease with emphysema and pulmonary scarring. Multifocal patchy lung opacities are again demonstrated. No new findings. IMPRESSION: Significant chronic underlying lung disease and persistent multifocal patchy lung opacities. Electronically Signed   By: Rudie MeyerP.  Gallerani M.D.   On: 08/13/2020 12:24   MR BRAIN W WO CONTRAST  Result Date: 08/14/2020 CLINICAL DATA:  Binocular vision loss. EXAM: MRI HEAD WITHOUT AND WITH CONTRAST TECHNIQUE: Multiplanar, multiecho pulse sequences of the brain and surrounding structures were obtained without and with intravenous contrast. CONTRAST:  5mL GADAVIST GADOBUTROL 1 MMOL/ML IV SOLN COMPARISON:  CT head without contrast 12/17/2012. FINDINGS: Brain: The diffusion-weighted images demonstrate no acute or subacute infarction. Atrophy is mildly advanced for age. Minimal white matter changes likely within normal limits for age. No acute infarct, hemorrhage, or mass lesion is  present. The ventricles are of normal size. No significant extraaxial fluid collection is present. The internal auditory canals are within normal limits. The brainstem and cerebellum are within normal limits. Postcontrast images demonstrate no pathologic enhancement. Vascular: Flow is present in the major intracranial arteries. Skull and upper cervical spine: The craniocervical junction is normal. Upper cervical spine is within normal limits. Marrow signal is unremarkable. Sinuses/Orbits: The paranasal sinuses and mastoid air cells are clear. The globes and orbits are within normal limits. IMPRESSION: 1. No acute intracranial abnormality. 2. Atrophy is mildly advanced for age. This may reflect the sequela of chronic microvascular ischemia. Electronically Signed   By: Marin Robertshristopher  Mattern M.D.   On: 08/14/2020 12:47    Scheduled Meds: . ARIPiprazole  2 mg Oral Daily  . ascorbic acid  250 mg Oral BID  . aspirin EC  81 mg Oral Daily  . buPROPion  150 mg Oral Daily  . clonazePAM  1 mg Oral BID  . dextromethorphan-guaiFENesin  1 tablet Oral BID  . DULoxetine  60 mg Oral Daily  . enoxaparin (LOVENOX) injection  40 mg Subcutaneous Q24H  . feeding supplement (NEPRO CARB STEADY)  237 mL Oral TID BM  . fluticasone  2 spray Each Nare Daily  . guaiFENesin  600 mg Oral BID  . insulin aspart  0-5 Units Subcutaneous QHS  . insulin aspart  0-9 Units Subcutaneous TID WC  . ipratropium-albuterol  3 mL Nebulization QID  . multivitamin with minerals  1 tablet Oral Daily  . oxyCODONE  20 mg Oral 5 X Daily  . pantoprazole  40 mg Oral QHS  . pregabalin  150 mg Oral BID  . traZODone  150 mg Oral QHS  . venlafaxine  37.5 mg Oral BID WC   Continuous Infusions: . sodium chloride 50 mL/hr at 08/14/20 0733  . ampicillin-sulbactam (UNASYN) IV 3 g (08/14/20 1316)  . azithromycin 500 mg (08/14/20 0207)    Assessment/Plan:  1. Visual disturbance and seeing whitish on both of his eyes.  MRI of the brain negative for  stroke or mass.  Will need to follow-up with ophthalmology as outpatient 2. Weakness and unsteady gait will check orthostatic vital signs, apply TED hose and get physical therapy evaluation 3. Recurrent aspiration pneumonia and dysphagia.  Continue dysphagia diet.  Unfortunately patient lost his bottom teeth in the emergency room.  Patient on Unasyn and Zithromax.  Will check a procalcitonin again tomorrow.  Recommend only short course of antibiotic. 4. Hypotension.  IV fluid hydration.  Check orthostatic vital signs 5. Hyponatremia this improved with IV fluids 6. COPD without exacerbation continue nebulizers 7. Depression  and anxiety on Wellbutrin and Abilify Klonopin and Cymbalta 8. Chronic pain medications can be contributing to the aspiration pneumonia        Code Status:     Code Status Orders  (From admission, onward)         Start     Ordered   08/13/20 2045  Full code  Continuous        08/13/20 2047        Code Status History    Date Active Date Inactive Code Status Order ID Comments User Context   07/29/2020 1229 07/31/2020 1621 Full Code [id number]  Lucile Shutters, MD ED   05/03/2018 1455 05/05/2018 1505 Full Code [id number]  Alford Highland, MD ED   09/08/2017 1520 09/10/2017 1631 Full Code [id number]  Altamese Dilling, MD Inpatient   04/06/2017 0917 04/11/2017 1748 Full Code [id number]  Katharina Caper, MD ED   04/23/2015 1301 04/25/2015 1757 Full Code [id number]  Alford Highland, MD ED   11/18/2014 1752 11/21/2014 1504 Full Code [id number]  Rhetta Mura, MD Inpatient   Advance Care Planning Activity     Family Communication: Spoke with wife on the phone Disposition Plan: Status is: Inpatient  Dispo: The patient is from: Home              Anticipated d/c is to: Home              Anticipated d/c date is: Potentially 08/15/2020 or 08/16/2020.              Patient currently receiving IV antibiotics for aspiration pneumonia IV fluids for relative hypotension.  Time  spent: 28 minutes  [employer]

## 2020-08-15 DIAGNOSIS — J690 Pneumonitis due to inhalation of food and vomit: Secondary | ICD-10-CM | POA: Diagnosis not present

## 2020-08-15 LAB — BASIC METABOLIC PANEL
Anion gap: 6 (ref 5–15)
BUN: 8 mg/dL (ref 8–23)
CO2: 35 mmol/L — ABNORMAL HIGH (ref 22–32)
Calcium: 8.7 mg/dL — ABNORMAL LOW (ref 8.9–10.3)
Chloride: 101 mmol/L (ref 98–111)
Creatinine, Ser: 0.53 mg/dL — ABNORMAL LOW (ref 0.61–1.24)
GFR, Estimated: >60 mL/min (ref >60)
Glucose, Bld: 79 mg/dL (ref 70–99)
Potassium: 3.5 mmol/L (ref 3.5–5.1)
Sodium: 142 mmol/L (ref 135–145)

## 2020-08-15 LAB — GLUCOSE, CAPILLARY
Glucose-Capillary: 79 mg/dL (ref 70–99)
Glucose-Capillary: 136 mg/dL — ABNORMAL HIGH (ref 70–99)
Glucose-Capillary: 134 mg/dL — ABNORMAL HIGH (ref 70–99)
Glucose-Capillary: 148 mg/dL — ABNORMAL HIGH (ref 70–99)

## 2020-08-15 LAB — CBC
HCT: 28.9 % — ABNORMAL LOW (ref 39.0–52.0)
Hemoglobin: 9.3 g/dL — ABNORMAL LOW (ref 13.0–17.0)
MCH: 30.3 pg (ref 26.0–34.0)
MCHC: 32.2 g/dL (ref 30.0–36.0)
MCV: 94.1 fL (ref 80.0–100.0)
Platelets: 152 10*3/uL (ref 150–400)
RBC: 3.07 MIL/uL — ABNORMAL LOW (ref 4.22–5.81)
RDW: 13.9 % (ref 11.5–15.5)
WBC: 8.7 10*3/uL (ref 4.0–10.5)
nRBC: 0 % (ref 0.0–0.2)

## 2020-08-15 LAB — PROCALCITONIN: Procalcitonin: <0.1 ng/mL

## 2020-08-15 MED ORDER — SENNOSIDES-DOCUSATE SODIUM 8.6-50 MG PO TABS
1.0000 | ORAL_TABLET | Freq: Two times a day (BID) | ORAL | Status: DC
  Administered 2020-08-15 – 2020-08-16 (×3): 1 via ORAL
  Filled 2020-08-15 (×3): qty 1

## 2020-08-15 MED ORDER — AMOXICILLIN-POT CLAVULANATE 875-125 MG PO TABS
1.0000 | ORAL_TABLET | Freq: Two times a day (BID) | ORAL | Status: DC
  Administered 2020-08-15 – 2020-08-16 (×2): 1 via ORAL
  Filled 2020-08-15 (×3): qty 1

## 2020-08-15 MED ORDER — POLYETHYLENE GLYCOL 3350 17 G PO PACK
17.0000 g | PACK | Freq: Every day | ORAL | Status: DC | PRN
  Filled 2020-08-15: qty 1

## 2020-08-15 MED ORDER — IPRATROPIUM-ALBUTEROL 0.5-2.5 (3) MG/3ML IN SOLN
3.0000 mL | Freq: Two times a day (BID) | RESPIRATORY_TRACT | Status: DC
  Administered 2020-08-15 – 2020-08-16 (×2): 3 mL via RESPIRATORY_TRACT
  Filled 2020-08-15 (×2): qty 3

## 2020-08-15 NOTE — Progress Notes (Signed)
Patient is requiring O2 [place] currently on 1L without O2 85-86% with O2 94% may need O2 .

## 2020-08-15 NOTE — Evaluation (Signed)
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  Does endorse recent lap around nursing station with staff.  Bilat UE/LE strength and ROM grossly symmetrical and WFL; no focal weakness appreciated.  Able to complete bed mobility indep; sit/stand, basic transfers and gait (200') without assist device, sup/mod indep.  Demonstrates reciprocal stepping pattern; cautious stepping pattern and cadence; mild sway, but no overt buckling or LOB.  Requries 2L supplemental O2 to maintain sats >90% with exertion. Mild drop in BP with orthostatic assessment, but does appear to recover with accommodation to upright position. Would benefit from skilled PT to address above deficits and promote optimal return to PLOF.; Recommend transition to HHPT upon discharge from acute hospitalization.   SaO2 on room air at rest = 92% SaO2 on room air while ambulating = 85% SaO2 on 2 liters of O2 while ambulating = 92%      Follow Up Recommendations Home health PT    Equipment Recommendations       Recommendations for Other Services       Precautions / Restrictions Precautions Precautions: None Restrictions Weight Bearing Restrictions: No      Mobility  Bed Mobility Overal bed mobility: Independent                  Transfers Overall transfer level: Needs assistance Equipment used: None Transfers: Sit to/from Stand Sit to Stand: Supervision            Ambulation/Gait Ambulation/Gait assistance: Supervision Gait Distance (Feet): 200 Feet Assistive device: None   Gait velocity: 10' walk time, 10 seconds    General Gait Details: reciprocal stepping pattern; cautious stepping pattern and cadence; mild sway, but no overt buckling or LOB.  Requries 2L supplemental O2 to maintain sats >90% with exertion.  Stairs            Wheelchair Mobility    Modified Rankin (Stroke Patients Only)       Balance Overall balance assessment: Needs assistance Sitting-balance support: No upper extremity supported;Feet supported Sitting balance-Leahy Scale: Good     Standing balance support: No upper extremity supported Standing balance-Leahy Scale: Good                               Pertinent Vitals/Pain Pain Assessment: No/denies pain    Home Living Family/patient expects to be discharged to:: Private residence Living Arrangements: Spouse/significant other;Children Available Help at Discharge: Family Type of Home: Mobile home Home Access: Ramped entrance     Home Layout: One level Home Equipment: Environmental consultant - 2 wheels      Prior Function Level of Independence: Independent         Comments: Indep with ADLs, household and community mobility without assist device; home O2 since recent hospitalization (discharged 12/14); denies fall history.  Does try to work intermittently, installing windows/siding with sons     Hand Dominance        Extremity/Trunk Assessment   Upper Extremity Assessment Upper Extremity Assessment: Overall WFL for tasks assessed    Lower Extremity Assessment Lower Extremity Assessment: Overall WFL for tasks assessed (grossly 4/5 throughout)  Communication   Communication: No difficulties  Cognition Arousal/Alertness: Awake/alert Behavior During Therapy: WFL for tasks assessed/performed Overall Cognitive Status: Within Functional Limits for tasks assessed                                        General Comments      Exercises     Assessment/Plan    PT Assessment Patient needs continued PT services  PT Problem List  Decreased strength;Decreased activity tolerance;Decreased balance;Decreased mobility;Decreased coordination;Decreased knowledge of use of DME;Decreased cognition;Cardiopulmonary status limiting activity;Decreased knowledge of precautions       PT Treatment Interventions DME instruction;Gait training;Functional mobility training;Therapeutic exercise;Therapeutic activities;Balance training;Patient/family education    PT Goals (Current goals can be found in the Care Plan section)  Acute Rehab PT Goals Patient Stated Goal: to return home PT Goal Formulation: With patient Time For Goal Achievement: 08/29/20 Potential to Achieve Goals: Good    Frequency Min 2X/week   Barriers to discharge        Co-evaluation               AM-PAC PT "6 Clicks" Mobility  Outcome Measure Help needed turning from your back to your side while in a flat bed without using bedrails?: None Help needed moving from lying on your back to sitting on the side of a flat bed without using bedrails?: None Help needed moving to and from a bed to a chair (including a wheelchair)?: None Help needed standing up from a chair using your arms (e.g., wheelchair or bedside chair)?: None Help needed to walk in hospital room?: A Little Help needed climbing 3-5 steps with a railing? : A Little 6 Click Score: 22    End of Session   Activity Tolerance: Patient tolerated treatment well Patient left: in bed;with call bell/phone within reach Nurse Communication: Mobility status PT Visit Diagnosis: Muscle weakness (generalized) (M62.81);Difficulty in walking, not elsewhere classified (R26.2)    Time: [id number] PT Time Calculation (min) (ACUTE ONLY): 22 min   Charges:   PT Evaluation $PT Eval Moderate Complexity: 1 Mod          Lexianna Weinrich H. Owens Shark, PT, DPT, NCS 08/15/20, 5:14 PM [phone]

## 2020-08-15 NOTE — Progress Notes (Signed)
PROGRESS NOTE    Mark [name]  WEX:[id number] DOB: Aug 23, 1955 DOA: 08/13/2020 PCP: Coralee Rud, PA-C   Chief Complaint  Patient presents with  . Shortness of Breath    Brief Narrative: 64 year old male with history of COPD, aspiration pneumonia recently admitted for acute respiratory failure due to multifocal pneumonia, discharged on 12/14 presented with worsening dyspnea, associated lightheadedness and dizziness and cough with brownish sputum when wheezing.   As per the report he usually uses O2 at 2 L/min nightly and since last discharge has been using it during the daytime.  He denied any fever or chills.  No dysuria, oliguria or hematuria or flank pain.  He has been vaccinated against COVID-19 as well as influenza.  He has been getting thickened liquids and then had significant choking on food, nausea or vomiting. Upon presentation to the emergency room, blood pressure was was 90/55 and later 142/96.  Pulse oximetry was 94 to 97% on 2 L of O2 by nasal cannula.  Labs revealed borderline potassium of 5 and CO2 of 33. Lactic acid was 1.1 and later 1.8 and procalcitonin less than 0.1.  CBC showed leukocytosis of 14.9. Influenza antigens and COVID-19 PCR came back negative.  Blood cultures were drawn.  EKG showed normal sinus rhythm with rate of 78.  Two-view chest x-ray showed persistent multifocal patchy lung opacities with significant chronic underlying lung disease. Patient was given IV cefepime IV fluids and admitted.  Subjective: Resting comfortably Was orthostatic on BP yesterday evening and remains on ivf-improved with midodrine able to eat well on dysphagia diet procal eng, on RA now   Assessment & Plan:  Visual disturbances/seeing whitish in both eyes MRI brain negative for stroke or any mass, advised ophthalmology follow-up as outpatient.  Currently doing well.  Weakness with unsteady gait blood pressure did drop in 80s 12/28-placed back on midodrine.  Blood pressure  stable, monitor orthostatic vitals he seems to be doing well ambulating without any issues.  Encourage oral hydration.  Aspiration pneumonia recurrent issues with dysphagia, had esophageal dilation on 04/2020, and is followed by GI as outpatient.  Is please advise outpatient GI follow-up. procal negative this morning we will switch to oral antibiotics make sure he tolerates.  Continue on dysphagia 2 diet with nectar thick.  Hypotension orthostatic continue midodrine monitor, cont IV fluids.  Hyponatremia improved with IV fluids.  COPD without exacerbation continue as needed nebs  Anxiety/depression on Wellbutrin, Klonopin, Cymbalta and Abilify.  His mood appears stable.  Chronic pain: Pain medication can contribute to aspiration issues.  Caution with pain medication opiates.  Nutrition: Diet Order            DIET DYS 2 Room service appropriate? Yes; Fluid consistency: Nectar Thick  Diet effective now                 Nutrition Problem: Severe Malnutrition Etiology: chronic illness (COPD, tonsillar CA s/p tonsillectomy chemo/XRT, dysphagia) Signs/Symptoms: severe muscle depletion,severe fat depletion Interventions: Nepro shake,Magic cup,MVI  Body mass index is 16.27 kg/m.  DVT prophylaxis: Place TED hose Start: 08/14/20 1421 enoxaparin (LOVENOX) injection 40 mg Start: 08/13/20 2200 Code Status:   Code Status: Full Code  Family Communication: plan of care discussed with patient at bedside.  Status is: Inpatient Remains inpatient appropriate because:Inpatient level of care appropriate due to severity of illness  Dispo: The patient is from: Home              Anticipated d/c is to: Home  Anticipated d/c date is: 1 day if tolerates oral antibiotics and no recurrent hypotension              Patient currently is not medically stable to d/c.  Consultants:see note  Procedures:see note  Culture/Microbiology    Component Value Date/Time   SDES BLOOD BLOOD LEFT WRIST  08/13/2020 1644   SPECREQUEST  08/13/2020 1644    BOTTLES DRAWN AEROBIC AND ANAEROBIC Blood Culture adequate volume   CULT  08/13/2020 1644    NO GROWTH 2 DAYS Performed at [facility], [address] [place] [place], [place] [address]    REPTSTATUS PENDING 08/13/2020 1644    Other culture-see note  Medications: Scheduled Meds: . ARIPiprazole  2 mg Oral Daily  . ascorbic acid  250 mg Oral BID  . aspirin EC  81 mg Oral Daily  . buPROPion  150 mg Oral Daily  . clonazePAM  1 mg Oral BID  . dextromethorphan-guaiFENesin  1 tablet Oral BID  . DULoxetine  60 mg Oral Daily  . enoxaparin (LOVENOX) injection  40 mg Subcutaneous Q24H  . feeding supplement (NEPRO CARB STEADY)  237 mL Oral BID BM  . fluticasone  2 spray Each Nare Daily  . guaiFENesin  600 mg Oral BID  . insulin aspart  0-5 Units Subcutaneous QHS  . insulin aspart  0-9 Units Subcutaneous TID WC  . ipratropium-albuterol  3 mL Nebulization BID  . midodrine  5 mg Oral TID WC  . multivitamin with minerals  1 tablet Oral Daily  . nicotine  21 mg Transdermal Daily  . oxyCODONE  20 mg Oral 5 X Daily  . pantoprazole  40 mg Oral QHS  . pregabalin  150 mg Oral BID  . senna-docusate  1 tablet Oral BID  . traZODone  150 mg Oral QHS  . venlafaxine  37.5 mg Oral BID WC   Continuous Infusions: . sodium chloride 50 mL/hr at 08/14/20 1601  . ampicillin-sulbactam (UNASYN) IV 3 g (08/15/20 0548)  . azithromycin 500 mg (08/14/20 2153)    Antimicrobials: Anti-infectives (From admission, onward)   Start     Dose/Rate Route Frequency Ordered Stop   08/14/20 0000  Ampicillin-Sulbactam (UNASYN) 3 g in sodium chloride 0.9 % 100 mL IVPB        3 g 200 mL/hr over 30 Minutes Intravenous Every 6 hours 08/13/20 2353     08/13/20 2100  azithromycin (ZITHROMAX) 500 mg in sodium chloride 0.9 % 250 mL IVPB        500 mg 250 mL/hr over 60 Minutes Intravenous Every 24 hours 08/13/20 2047 08/18/20 2044   08/13/20 2100  Ampicillin-Sulbactam  (UNASYN) 3 g in sodium chloride 0.9 % 100 mL IVPB  Status:  Discontinued        3 g 200 mL/hr over 30 Minutes Intravenous Every 6 hours 08/13/20 2047 08/13/20 2352   08/13/20 1900  vancomycin (VANCOREADY) IVPB 1250 mg/250 mL        1,250 mg 166.7 mL/hr over 90 Minutes Intravenous  Once 08/13/20 1847 08/13/20 2032   08/13/20 1830  vancomycin (VANCOCIN) IVPB 1000 mg/200 mL premix  Status:  Discontinued        1,000 mg 200 mL/hr over 60 Minutes Intravenous  Once 08/13/20 1815 08/13/20 1847   08/13/20 1830  ceFEPIme (MAXIPIME) 2 g in sodium chloride 0.9 % 100 mL IVPB        2 g 200 mL/hr over 30 Minutes Intravenous  Once 08/13/20 1815 08/13/20 1851  Objective: Vitals: Today's Vitals   08/15/20 0556 08/15/20 0558 08/15/20 0723 08/15/20 0746  BP: 116/69 111/76  109/64  Pulse: 73 83  77  Resp: 18 18  16   Temp:    (!) 97.5 F (36.4 C)  TempSrc:      SpO2: 99% 96%  91%  Weight:      Height:      PainSc:   Asleep     Intake/Output Summary (Last 24 hours) at 08/15/2020 0952 Last data filed at 08/14/2020 1705 Gross per 24 hour  Intake 1548.39 ml  Output 250 ml  Net 1298.39 ml   Filed Weights   08/13/20 1131  Weight: 54.4 kg   Weight change:   Intake/Output from previous day: 12/28 0701 - 12/29 0700 In: 1548.4 [I.V.:998.4; IV Piggyback:550] Out: 250 [Urine:250] Intake/Output this shift: No intake/output data recorded.  Examination: General exam: AAO,thin/frail,NAD, weak appearing. HEENT:Oral mucosa moist, Ear/Nose WNL grossly,dentition normal. Respiratory system: bilaterally diminished breath sound mild basal crackles,no use of accessory muscle, non tender. Cardiovascular system: S1 & S2 +, regular, No JVD. Gastrointestinal system: Abdomen soft, NT,ND, BS+. Nervous System:Alert, awake, moving extremities and grossly nonfocal Extremities: No edema, distal peripheral pulses palpable.  Skin: No rashes,no icterus. MSK: Normal muscle bulk,tone, power  Data Reviewed: I  have personally reviewed following labs and imaging studies CBC: Recent Labs  Lab 08/13/20 1145 08/14/20 0657 08/15/20 0537  WBC 14.9* 5.4 8.7  HGB 11.0* 9.1* 9.3*  HCT 35.4* 29.0* 28.9*  MCV 95.2 95.4 94.1  PLT 218 158 0000000   Basic Metabolic Panel: Recent Labs  Lab 08/13/20 1145 08/14/20 0657 08/15/20 0537  NA 133* 144 142  K 5.0 4.2 3.5  CL 94* 105 101  CO2 33* 33* 35*  GLUCOSE 110* 63* 79  BUN 12 9 8   CREATININE 0.65 0.61 0.53*  CALCIUM 8.9 8.5* 8.7*   GFR: Estimated Creatinine Clearance: 71.8 mL/min (A) (by C-G formula based on SCr of 0.53 mg/dL (L)). Liver Function Tests: No results for input(s): AST, ALT, ALKPHOS, BILITOT, PROT, ALBUMIN in the last 168 hours. No results for input(s): LIPASE, AMYLASE in the last 168 hours. No results for input(s): AMMONIA in the last 168 hours. Coagulation Profile: No results for input(s): INR, PROTIME in the last 168 hours. Cardiac Enzymes: No results for input(s): CKTOTAL, CKMB, CKMBINDEX, TROPONINI in the last 168 hours. BNP (last 3 results) No results for input(s): PROBNP in the last 8760 hours. HbA1C: Recent Labs    08/14/20 0657  HGBA1C 5.5   CBG: Recent Labs  Lab 08/14/20 1328 08/14/20 1640 08/14/20 2114 08/15/20 0745  GLUCAP 145* 161* 104* 79   Lipid Profile: No results for input(s): CHOL, HDL, LDLCALC, TRIG, CHOLHDL, LDLDIRECT in the last 72 hours. Thyroid Function Tests: No results for input(s): TSH, T4TOTAL, FREET4, T3FREE, THYROIDAB in the last 72 hours. Anemia Panel: No results for input(s): VITAMINB12, FOLATE, FERRITIN, TIBC, IRON, RETICCTPCT in the last 72 hours. Sepsis Labs: Recent Labs  Lab 08/13/20 1622 08/13/20 1644 08/15/20 0537  PROCALCITON <0.10  --  <0.10  LATICACIDVEN  --  1.8  1.1  --     Recent Results (from the past 240 hour(s))  Blood culture (routine x 2)     Status: None (Preliminary result)   Collection Time: 08/13/20  4:28 PM   Specimen: BLOOD  Result Value Ref Range  Status   Specimen Description BLOOD LEFT ANTECUBITAL  Final   Special Requests   Final    BOTTLES DRAWN AEROBIC  AND ANAEROBIC Blood Culture adequate volume   Culture   Final    NO GROWTH 2 DAYS Performed at [facility], [address] [place], [place] [address]    Report Status PENDING  Incomplete  Resp Panel by RT-PCR (Flu A&B, Covid) Nasopharyngeal Swab     Status: None   Collection Time: 08/13/20  4:44 PM   Specimen: Nasopharyngeal Swab; Nasopharyngeal(NP) swabs in vial transport medium  Result Value Ref Range Status   SARS Coronavirus 2 by RT PCR NEGATIVE NEGATIVE Final    Comment: (NOTE) SARS-CoV-2 target nucleic acids are NOT DETECTED.  The SARS-CoV-2 RNA is generally detectable in upper respiratory specimens during the acute phase of infection. The lowest concentration of SARS-CoV-2 viral copies this assay can detect is 138 copies/mL. A negative result does not preclude SARS-Cov-2 infection and should not be used as the sole basis for treatment or other patient management decisions. A negative result may occur with  improper specimen collection/handling, submission of specimen other than nasopharyngeal swab, presence of viral mutation(s) within the areas targeted by this assay, and inadequate number of viral copies(<138 copies/mL). A negative result must be combined with clinical observations, patient history, and epidemiological information. The expected result is Negative.  Fact Sheet for Patients:  EntrepreneurPulse.com.au  Fact Sheet for Healthcare Providers:  IncredibleEmployment.be  This test is no t yet approved or cleared by the [place] FDA and  has been authorized for detection and/or diagnosis of SARS-CoV-2 by FDA under an Emergency Use Authorization (EUA). This EUA will remain  in effect (meaning this test can be used) for the duration of the COVID-19 declaration under Section 564(b)(1) of the Act,  21 U.S.C.section 360bbb-3(b)(1), unless the authorization is terminated  or revoked sooner.       Influenza A by PCR NEGATIVE NEGATIVE Final   Influenza B by PCR NEGATIVE NEGATIVE Final    Comment: (NOTE) The Xpert Xpress SARS-CoV-2/FLU/RSV plus assay is intended as an aid in the diagnosis of influenza from Nasopharyngeal swab specimens and should not be used as a sole basis for treatment. Nasal washings and aspirates are unacceptable for Xpert Xpress SARS-CoV-2/FLU/RSV testing.  Fact Sheet for Patients: EntrepreneurPulse.com.au  Fact Sheet for Healthcare Providers: IncredibleEmployment.be  This test is not yet approved or cleared by the [place] FDA and has been authorized for detection and/or diagnosis of SARS-CoV-2 by FDA under an Emergency Use Authorization (EUA). This EUA will remain in effect (meaning this test can be used) for the duration of the COVID-19 declaration under Section 564(b)(1) of the Act, 21 U.S.C. section 360bbb-3(b)(1), unless the authorization is terminated or revoked.  Performed at [facility], [address] [place], [place] [address]   Blood culture (routine x 2)     Status: None (Preliminary result)   Collection Time: 08/13/20  4:44 PM   Specimen: BLOOD  Result Value Ref Range Status   Specimen Description BLOOD BLOOD LEFT WRIST  Final   Special Requests   Final    BOTTLES DRAWN AEROBIC AND ANAEROBIC Blood Culture adequate volume   Culture   Final    NO GROWTH 2 DAYS Performed at [facility], [address] [place], [place] [address]    Report Status PENDING  Incomplete     Radiology Studies: DG Chest 2 View  Result Date: 08/13/2020 CLINICAL DATA:  Shortness of breath. EXAM: CHEST - 2 VIEW COMPARISON:  Chest x-ray 07/29/2020 and chest CT 07/29/2020 FINDINGS: The cardiac silhouette, mediastinal hilar contours are  within normal limits and stable. Stable mild tortuosity and  calcification of the thoracic aorta. Stable underlying significant chronic lung disease with emphysema and pulmonary scarring. Multifocal patchy lung opacities are again demonstrated. No new findings. IMPRESSION: Significant chronic underlying lung disease and persistent multifocal patchy lung opacities. Electronically Signed   By: Marijo Sanes M.D.   On: 08/13/2020 12:24   MR BRAIN W WO CONTRAST  Result Date: 08/14/2020 CLINICAL DATA:  Binocular vision loss. EXAM: MRI HEAD WITHOUT AND WITH CONTRAST TECHNIQUE: Multiplanar, multiecho pulse sequences of the brain and surrounding structures were obtained without and with intravenous contrast. CONTRAST:  98mL GADAVIST GADOBUTROL 1 MMOL/ML IV SOLN COMPARISON:  CT head without contrast 12/17/2012. FINDINGS: Brain: The diffusion-weighted images demonstrate no acute or subacute infarction. Atrophy is mildly advanced for age. Minimal white matter changes likely within normal limits for age. No acute infarct, hemorrhage, or mass lesion is present. The ventricles are of normal size. No significant extraaxial fluid collection is present. The internal auditory canals are within normal limits. The brainstem and cerebellum are within normal limits. Postcontrast images demonstrate no pathologic enhancement. Vascular: Flow is present in the major intracranial arteries. Skull and upper cervical spine: The craniocervical junction is normal. Upper cervical spine is within normal limits. Marrow signal is unremarkable. Sinuses/Orbits: The paranasal sinuses and mastoid air cells are clear. The globes and orbits are within normal limits. IMPRESSION: 1. No acute intracranial abnormality. 2. Atrophy is mildly advanced for age. This may reflect the sequela of chronic microvascular ischemia. Electronically Signed   By: San Morelle M.D.   On: 08/14/2020 12:47     LOS: 2 days   Antonieta Pert, MD [facility]  08/15/2020, 9:52 AM

## 2020-08-15 NOTE — TOC Initial Note (Signed)
Transition of Care [facility]) - Initial/Assessment Note    Patient Details  Name: Mark [name] MRN: [id number] Date of Birth: May 15, 1956  [facility]) CM/SW Contact:    Magnus Ivan, LCSW Phone Number: 08/15/2020, 1:06 PM  Clinical Narrative:        CSW met with patient at bedside for Readmission Prevention Screening. Patient lives with his wife and drives himself to appointments. PCP is Dr. Lennice Sites. [facility]acy [facility] [place]. Patient has access to a RW, w/c, and 3 in 1. Patient has home o2 through [facility]. PT and OT evals are pending, patient said he would be agreeable to [facility] if it was recommended but would not be agreeable to SNF if recommended. Patient denied needs at this time.            Expected Discharge Plan: Home/Self Care Barriers to Discharge: Continued Medical Work up   Patient Goals and CMS Choice Patient states their goals for this hospitalization and ongoing recovery are:: to return home where he lives with his wife CMS Medicare.gov Compare Post Acute Care list provided to:: Patient Choice offered to / list presented to : Patient  Expected Discharge Plan and Services Expected Discharge Plan: Home/Self Care       Living arrangements for the past 2 months: Single Family Home                                      Prior Living Arrangements/Services Living arrangements for the past 2 months: Single Family Home Lives with:: Spouse Patient language and need for interpreter reviewed:: Yes Do you feel safe going back to the place where you live?: Yes      Need for Family Participation in Patient Care: Yes (Comment) Care giver support system in place?: Yes (comment) Current home services: DME Criminal Activity/Legal Involvement Pertinent to Current Situation/Hospitalization: No - Comment as needed  Activities of Daily Living Home Assistive Devices/Equipment: None ADL Screening (condition at time of admission) Patient's cognitive  ability adequate to safely complete daily activities?: Yes Is the patient deaf or have difficulty hearing?: No Does the patient have difficulty seeing, even when wearing glasses/contacts?: No Does the patient have difficulty concentrating, remembering, or making decisions?: No Patient able to express need for assistance with ADLs?: Yes Does the patient have difficulty dressing or bathing?: No Independently performs ADLs?: Yes (appropriate for developmental age) Does the patient have difficulty walking or climbing stairs?: No Weakness of Arms/Hands: Both  Permission Sought/Granted Permission sought to share information with : Chartered certified accountant granted to share information with : Yes, Verbal Permission Granted     Permission granted to share info w AGENCY: [place] and DME agencies if needed        Emotional Assessment       Orientation: : Oriented to Self,Oriented to Place,Oriented to  Time,Oriented to Situation Alcohol / Substance Use: Not Applicable Psych Involvement: No (comment)  Admission diagnosis:  Aspiration pneumonia ([place]) [J69.0] Pneumonia due to infectious organism, unspecified laterality, unspecified part of lung [J18.9] Patient Active Problem List   Diagnosis Date Noted  . Visual disturbances   . Weakness   . Hypotension   . Hyponatremia   . Other chronic pain   . Chronic respiratory failure ([place]) 07/29/2020  . Dysphagia   . Palliative care by specialist   . Protein-calorie malnutrition, severe 09/10/2017  . Esophageal candidiasis ([place])   .  Esophageal dysphagia   . Palliative care encounter   . Severe sepsis with septic shock ([place]) 04/06/2017  . Multifocal pneumonia 04/06/2017  . Lactic acidosis 04/06/2017  . Acute respiratory failure with hypoxia ([place]) 04/06/2017  . Septic shock ([place]) 04/06/2017  . Obstruction of esophagus   . 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 Bronchiectasis ([place]) 03/26/2016  . Stricture and  stenosis of esophagus   . 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 COPD (chronic obstructive pulmonary disease) ([place]) 12/19/2015  . Tobacco dependency 12/19/2015  . 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 Problems with swallowing and mastication   . Mechanical complication of gastrostomy ([place])   . Gastritis   . Excessive urination at night 05/30/2015  . Goals of care, counseling/discussion 05/30/2015  . FOM (frequency of micturition) 05/30/2015  . Severe sepsis ([place]) 04/23/2015  . Aspiration pneumonia ([place]) 11/18/2014  . Tonsillar cancer, S/P surgery, chemotherapy XRT 2009-followed at [facility] 11/18/2014  . Peripheral neuropathy 11/18/2014  . Adult failure to thrive 11/18/2014  . Recurrent aspiration pneumonia ([place]) 11/18/2014  . Memory deficit 11/18/2014  . PEG tube  11/18/2014   PCP:  Secundino Ginger, PA-C Pharmacy:   [facility], [place] [address] [place] [address] Phone: [phone] Fax: [phone]  [id number] [place] [id number]: [phone]: [phone]    Social Determinants of Health (SDOH) Interventions    Readmission Risk Interventions Readmission Risk Prevention Plan 08/15/2020  Transportation Screening Complete  PCP or Specialist Appt within 3-5 Days Complete  [place] or [place]  Social Work Consult for [place]anning/Counseling Complete  Palliative Care Screening Not Applicable  Medication Review (Press photographer Complete  Some recent data might be hidden

## 2020-08-15 NOTE — Plan of Care (Signed)

## 2020-08-16 DIAGNOSIS — H539 Unspecified visual disturbance: Secondary | ICD-10-CM | POA: Diagnosis not present

## 2020-08-16 LAB — GLUCOSE, CAPILLARY: Glucose-Capillary: 95 mg/dL (ref 70–99)

## 2020-08-16 MED ORDER — MIDODRINE HCL 5 MG PO TABS: 5.0000 mg | ORAL_TABLET | Freq: Three times a day (TID) | ORAL | 0 refills | Status: AC

## 2020-08-16 MED ORDER — AMOXICILLIN-POT CLAVULANATE 875-125 MG PO TABS: 1.0000 | ORAL_TABLET | Freq: Two times a day (BID) | ORAL | 0 refills | Status: AC

## 2020-08-16 MED ORDER — DM-GUAIFENESIN ER 30-600 MG PO TB12: 1.0000 | ORAL_TABLET | Freq: Two times a day (BID) | ORAL | 0 refills | Status: AC

## 2020-08-16 NOTE — Plan of Care (Signed)

## 2020-08-16 NOTE — Plan of Care (Signed)
  Problem: Education: Goal: Knowledge of General Education information will improve Description: Including pain rating scale, medication(s)/side effects and non-pharmacologic comfort measures Outcome: Adequate for Discharge   Problem: Health Behavior/Discharge Planning: Goal: Ability to manage health-related needs will improve Outcome: Adequate for Discharge   Problem: Clinical Measurements: Goal: Ability to maintain clinical measurements within normal limits will improve Outcome: Adequate for Discharge Goal: Will remain free from infection Outcome: Adequate for Discharge Goal: Diagnostic test results will improve Outcome: Adequate for Discharge Goal: Respiratory complications will improve Outcome: Adequate for Discharge Goal: Cardiovascular complication will be avoided Outcome: Adequate for Discharge   Problem: Clinical Measurements: Goal: Will remain free from infection Outcome: Adequate for Discharge   Problem: Clinical Measurements: Goal: Diagnostic test results will improve Outcome: Adequate for Discharge   Problem: Clinical Measurements: Goal: Respiratory complications will improve Outcome: Adequate for Discharge   Problem: Coping: Goal: Level of anxiety will decrease Outcome: Adequate for Discharge   Problem: Elimination: Goal: Will not experience complications related to bowel motility Outcome: Adequate for Discharge Goal: Will not experience complications related to urinary retention Outcome: Adequate for Discharge

## 2020-08-16 NOTE — Discharge Summary (Addendum)
Physician Discharge Summary  Mark [name] [id number] DOB: 10-Nov-1955 DOA: 08/13/2020  PCP: Secundino Ginger, PA-C  Admit date: 08/13/2020 Discharge date: 08/16/2020  Admitted From: home Disposition:  hh  Recommendations for Outpatient Follow-up:  1. Follow up with PCP and GI in 1-2 weeks 2. Please obtain BMP/CBC in one week 3. Please follow up on the following pending results:  Home Health:yes  Equipment/Devices: no  Discharge Condition: Stable Code Status:   Code Status: Full Code Diet recommendation:  Diet Order            Diet general           DIET DYS 2 Room service appropriate? Yes; Fluid consistency: Nectar Thick  Diet effective now                  Brief/Interim Summary: 64 year old male with history of COPD, aspiration pneumonia recently admitted for acute respiratory failure due to multifocal pneumonia, discharged on 12/14 presented with worsening dyspnea, associated lightheadedness and dizziness and cough with brownish sputum when wheezing.   As per the report he usually uses O2 at 2 L/min nightly and since last discharge has been using it during the daytime. He denied any fever or chills. No dysuria, oliguria or hematuria or flank pain. He has been vaccinated against COVID-19 as well as influenza. He has been getting thickened liquids and then had significant choking on food, nausea or vomiting. Upon presentation to the emergency room, blood pressure was was 90/55 and later 142/96. Pulse oximetry was 94 to 97% on 2 L of O2 by nasal cannula. Labs revealed borderline potassium of 5 and CO2 of 33. Lactic acid was 1.1 and later 1.8 and procalcitonin less than 0.1. CBC showed leukocytosis of 14.9. Influenza antigens and COVID-19 PCR came back negative. Blood cultures were drawn.EKG showed normal sinus rhythm with rate of 78.Two-view chest x-ray showed persistent multifocal patchy lung opacities with significant chronic underlying lung disease. Patient was  given IV cefepime IV fluids and admitted. Patient was switched to Unasyn, he has orthostatic hypotension but improved after starting  midodrine. Respiratory stabilizes no more short of breath.  Tolerating dysphagia 2 diet.  Change to oral antibiotics and tolerated. Been ambulating without need for oxygen or any dizziness.  Discharge Diagnoses:   Visual disturbances/seeing whitish in both eyes MRI brain negative for stroke or any mass, advised ophthalmology follow-up as outpatient.    Continue outpatient follow-up.  Weakness with unsteady gait blood pressure did drop in 80s 12/28-placed back on midodrine.  Blood pressure stable, monitor orthostatic vitals he seems to be doing well ambulating without any issues.  Encourage oral hydration off IV fluids, continue midodrine at home.  Aspiration pneumonia recurrent issues with dysphagia, had esophageal dilation on 04/2020, and is followed by GI as outpatient.  Is please advise outpatient GI follow-up. procal negative switch to oral Augmentin which he tolerated.  He will be discharged on oral Augmentin course.  He will also continue a dysphagia 2 diet.  He will follow up with GI as outpatient soon advised to call upon discharge for f/u soon.    Hypotension orthostatic continue midodrine off IV fluids.  Blood pressure stable.  Hyponatremia improved with IV fluids.  COPD without exacerbation continue as needed nebs  Anxiety/depression on Wellbutrin, Klonopin, Cymbalta and Abilify.  His mood appears stable.  Chronic pain: Pain medication can contribute to aspiration issues.  Caution with pain medication opiates.  Consults:  pt  Subjective: Aaox3, ambulating in room and dressed  up and ready for home  Discharge Exam: Vitals:   08/16/20 0449 08/16/20 0730  BP: 117/74 140/82  Pulse: 79 66  Resp: 16 15  Temp: 98.4 F (36.9 C) 98.2 F (36.8 C)  SpO2: 98% 96%   General: Pt is alert, awake, not in acute distress Cardiovascular: RRR,  S1/S2 +, no rubs, no gallops Respiratory: CTA bilaterally, no wheezing, no rhonchi Abdominal: Soft, NT, ND, bowel sounds + Extremities: no edema, no cyanosis  Discharge Instructions  Discharge Instructions    Diet general   Complete by: As directed    DIET DYS 2 Room service appropriate? Yes; Fluid consistency: Nectar Thick   Discharge instructions   Complete by: As directed    Please call call MD or return to ER for similar or worsening recurring problem that brought you to hospital or if any fever,nausea/vomiting,abdominal pain, uncontrolled pain, chest pain,  shortness of breath or any other alarming symptoms.  Please follow-up with your gastroenterology,  call for appointment soon   Please follow-up your doctor as instructed in a week time and call the office for appointment.  Please avoid alcohol, smoking, or any other illicit substance and maintain healthy habits including taking your regular medications as prescribed.  You were cared for by a hospitalist during your hospital stay. If you have any questions about your discharge medications or the care you received while you were in the hospital after you are discharged, you can call the unit and ask to speak with the hospitalist on call if the hospitalist that took care of you is not available.  Once you are discharged, your primary care physician will handle any further medical issues. Please note that NO REFILLS for any discharge medications will be authorized once you are discharged, as it is imperative that you return to your primary care physician (or establish a relationship with a primary care physician if you do not have one) for your aftercare needs so that they can reassess your need for medications and monitor your lab values   Increase activity slowly   Complete by: As directed      Allergies as of 08/16/2020      Reactions   Morphine And Related Nausea And Vomiting      Medication List    TAKE these medications    albuterol 108 (90 Base) MCG/ACT inhaler Commonly known as: VENTOLIN HFA Inhale 2 puffs into the lungs every 6 (six) hours as needed for wheezing or shortness of breath.   amoxicillin-clavulanate 875-125 MG tablet Commonly known as: AUGMENTIN Take 1 tablet by mouth every 12 (twelve) hours for 3 days.   ARIPiprazole 2 MG tablet Commonly known as: ABILIFY Take 2 mg by mouth daily.   ascorbic acid 250 MG tablet Commonly known as: VITAMIN C Take 1 tablet (250 mg total) by mouth 2 (two) times daily.   aspirin EC 81 MG tablet 81 mg.   budesonide-formoterol 160-4.5 MCG/ACT inhaler Commonly known as: SYMBICORT Inhale into the lungs.   buPROPion 150 MG 12 hr tablet Commonly known as: WELLBUTRIN SR 150 mg.   clonazePAM 1 MG tablet Commonly known as: KLONOPIN Take 1 mg by mouth 2 (two) times daily.   dextromethorphan-guaiFENesin 30-600 MG 12hr tablet Commonly known as: MUCINEX DM Take 1 tablet by mouth 2 (two) times daily for 5 days.   DULoxetine 60 MG capsule Commonly known as: CYMBALTA Take 60 mg by mouth daily.   feeding supplement (NEPRO CARB STEADY) Liqd Take 237 mLs by mouth 3 (  three) times daily between meals.   Ipratropium-Albuterol 20-100 MCG/ACT Aers respimat Commonly known as: COMBIVENT Inhale 2 puffs into the lungs 2 (two) times daily.   midodrine 5 MG tablet Commonly known as: PROAMATINE Take 1 tablet (5 mg total) by mouth 3 (three) times daily with meals.   mometasone 50 MCG/ACT nasal spray Commonly known as: NASONEX Place 2 sprays into both nostrils 2 (two) times daily.   multivitamin with minerals Tabs tablet Take 1 tablet by mouth daily.   Oxycodone HCl 20 MG Tabs Take 20 mg by mouth 4 (four) times daily.   pantoprazole 40 MG tablet Commonly known as: PROTONIX Take 40 mg by mouth at bedtime.   pregabalin 150 MG capsule Commonly known as: LYRICA Take 1 capsule by mouth 2 (two) times daily.   traZODone 150 MG tablet Commonly known as:  DESYREL Take 150 mg by mouth at bedtime.   venlafaxine 37.5 MG tablet Commonly known as: EFFEXOR Take 37.5 mg by mouth 2 (two) times daily with a meal.       Follow-up Information    Coralee Rud, PA-C Follow up in 1 week(s).   Specialty: Cardiology Contact information: [facility]  [address] [place] [address] [phone]        Midge Minium, MD Follow up in 1 week(s).   Specialty: Gastroenterology Contact information: [address] [place]  [place] [address] [phone]              Allergies  Allergen Reactions  . Morphine And Related Nausea And Vomiting    The results of significant diagnostics from this hospitalization (including imaging, microbiology, ancillary and laboratory) are listed below for reference.    Microbiology: Recent Results (from the past 240 hour(s))  Blood culture (routine x 2)     Status: None (Preliminary result)   Collection Time: 08/13/20  4:28 PM   Specimen: BLOOD  Result Value Ref Range Status   Specimen Description BLOOD LEFT ANTECUBITAL  Final   Special Requests   Final    BOTTLES DRAWN AEROBIC AND ANAEROBIC Blood Culture adequate volume   Culture   Final    NO GROWTH 3 DAYS Performed at [facility], [address], [place], [place] [address]    Report Status PENDING  Incomplete  Resp Panel by RT-PCR (Flu A&B, Covid) Nasopharyngeal Swab     Status: None   Collection Time: 08/13/20  4:44 PM   Specimen: Nasopharyngeal Swab; Nasopharyngeal(NP) swabs in vial transport medium  Result Value Ref Range Status   SARS Coronavirus 2 by RT PCR NEGATIVE NEGATIVE Final    Comment: (NOTE) SARS-CoV-2 target nucleic acids are NOT DETECTED.  The SARS-CoV-2 RNA is generally detectable in upper respiratory specimens during the acute phase of infection. The lowest concentration of SARS-CoV-2 viral copies this assay can detect is 138 copies/mL. A negative result does not preclude SARS-Cov-2 infection and  should not be used as the sole basis for treatment or other patient management decisions. A negative result may occur with  improper specimen collection/handling, submission of specimen other than nasopharyngeal swab, presence of viral mutation(s) within the areas targeted by this assay, and inadequate number of viral copies(<138 copies/mL). A negative result must be combined with clinical observations, patient history, and epidemiological information. The expected result is Negative.  Fact Sheet for Patients:  BloggerCourse.com  Fact Sheet for Healthcare Providers:  SeriousBroker.it  This test is no t yet approved or cleared by the [place] and  has been authorized for detection and/or diagnosis of SARS-CoV-2 by FDA under an Emergency Use Authorization (EUA). This EUA will remain  in effect (meaning this test can be used) for the duration of the COVID-19 declaration under Section 564(b)(1) of the Act, 21 U.S.C.section 360bbb-3(b)(1), unless the authorization is terminated  or revoked sooner.       Influenza A by PCR NEGATIVE NEGATIVE Final   Influenza B by PCR NEGATIVE NEGATIVE Final    Comment: (NOTE) The Xpert Xpress SARS-CoV-2/FLU/RSV plus assay is intended as an aid in the diagnosis of influenza from Nasopharyngeal swab specimens and should not be used as a sole basis for treatment. Nasal washings and aspirates are unacceptable for Xpert Xpress SARS-CoV-2/FLU/RSV testing.  Fact Sheet for Patients: EntrepreneurPulse.com.au  Fact Sheet for Healthcare Providers: IncredibleEmployment.be  This test is not yet approved or cleared by the [place] FDA and has been authorized for detection and/or diagnosis of SARS-CoV-2 by FDA under an Emergency Use Authorization (EUA). This EUA will remain in effect (meaning this test can be used) for the duration of the COVID-19 declaration  under Section 564(b)(1) of the Act, 21 U.S.C. section 360bbb-3(b)(1), unless the authorization is terminated or revoked.  Performed at [facility], [address] [place], [place] [address]   Blood culture (routine x 2)     Status: None (Preliminary result)   Collection Time: 08/13/20  4:44 PM   Specimen: BLOOD  Result Value Ref Range Status   Specimen Description BLOOD BLOOD LEFT WRIST  Final   Special Requests   Final    BOTTLES DRAWN AEROBIC AND ANAEROBIC Blood Culture adequate volume   Culture   Final    NO GROWTH 3 DAYS Performed at [facility], [address], [place], [place] [address]    Report Status PENDING  Incomplete    Procedures/Studies: DG Chest 2 View  Result Date: 08/13/2020 CLINICAL DATA:  Shortness of breath. EXAM: CHEST - 2 VIEW COMPARISON:  Chest x-ray 07/29/2020 and chest CT 07/29/2020 FINDINGS: The cardiac silhouette, mediastinal hilar contours are within normal limits and stable. Stable mild tortuosity and calcification of the thoracic aorta. Stable underlying significant chronic lung disease with emphysema and pulmonary scarring. Multifocal patchy lung opacities are again demonstrated. No new findings. IMPRESSION: Significant chronic underlying lung disease and persistent multifocal patchy lung opacities. Electronically Signed   By: Marijo Sanes M.D.   On: 08/13/2020 12:24   DG Chest 2 View  Result Date: 07/29/2020 CLINICAL DATA:  Shortness of breath. EXAM: CHEST - 2 VIEW COMPARISON:  May 03, 2018. FINDINGS: The heart size and mediastinal contours are within normal limits. Stable diffuse coarse reticular densities are noted throughout the right lung most consistent with chronic interstitial lung disease or scarring. Increased coarse interstitial densities are noted in the left lung which may represent scarring or chronic interstitial lung disease, but acute superimposed inflammation cannot be excluded. No pneumothorax is noted.  Blunting of the left costophrenic sulcus is noted and unchanged most consistent with scarring. The visualized skeletal structures are unremarkable. IMPRESSION: Stable diffuse coarse reticular densities are noted throughout the right lung most consistent with chronic interstitial lung disease or scarring. Increased coarse interstitial densities are noted in the left lung which may represent scarring or chronic interstitial lung disease, but acute superimposed inflammation cannot be excluded. Electronically Signed   By: Marijo Conception M.D.   On: 07/29/2020 09:42   CT CHEST WO CONTRAST  Result Date: 07/29/2020 CLINICAL DATA:  Shortness of  breath, hypoxia, leukocytosis EXAM: CT CHEST WITHOUT CONTRAST TECHNIQUE: Multidetector CT imaging of the chest was performed following the standard protocol without IV contrast. COMPARISON:  CT 12/25/2015.  Same day chest x-ray FINDINGS: Cardiovascular: Normal heart size. Trace pericardial fluid. Thoracic aorta is nonaneurysmal. Scattered aortic atherosclerotic calcifications. Mediastinum/Nodes: No axillary or mediastinal lymphadenopathy. Evaluation of the hilar structures is limited in the absence of intravenous contrast. Trace amount of debris within the trachea. Thyroid and esophagus within normal limits. Lungs/Pleura: Advanced centrilobular and paraseptal emphysema with bullous changes of the right lung apex. No pneumothorax. Multifocal areas of airspace opacity and scattered ground-glass throughout both lungs with a slight upper lobe predominance. More focal consolidations within the periphery of the right upper lobe (series 3, image 45 and within the right perihilar region (series 3, image 80). No pleural effusion. Upper Abdomen: Stable 1.5 cm right hepatic lobe cyst. No acute findings within the included portion of the upper abdomen. Musculoskeletal: No acute osseous findings. No suspicious bone lesions. Paucity of subcutaneous fat. No chest wall abnormality. IMPRESSION:  1. Multifocal areas of airspace opacity and scattered ground-glass throughout both lungs with a slight upper lobe predominance. More focal consolidations within the periphery of the right upper lobe and within the right perihilar region. Findings are favored to represent multifocal atypical pneumonia. Follow-up to resolution is recommended. 2. Advanced centrilobular and paraseptal emphysema with bullous changes of the right lung apex. Aortic Atherosclerosis (ICD10-I70.0) and Emphysema (ICD10-J43.9). Electronically Signed   By: Davina Poke D.O.   On: 07/29/2020 14:53   MR BRAIN W WO CONTRAST  Result Date: 08/14/2020 CLINICAL DATA:  Binocular vision loss. EXAM: MRI HEAD WITHOUT AND WITH CONTRAST TECHNIQUE: Multiplanar, multiecho pulse sequences of the brain and surrounding structures were obtained without and with intravenous contrast. CONTRAST:  69mL GADAVIST GADOBUTROL 1 MMOL/ML IV SOLN COMPARISON:  CT head without contrast 12/17/2012. FINDINGS: Brain: The diffusion-weighted images demonstrate no acute or subacute infarction. Atrophy is mildly advanced for age. Minimal white matter changes likely within normal limits for age. No acute infarct, hemorrhage, or mass lesion is present. The ventricles are of normal size. No significant extraaxial fluid collection is present. The internal auditory canals are within normal limits. The brainstem and cerebellum are within normal limits. Postcontrast images demonstrate no pathologic enhancement. Vascular: Flow is present in the major intracranial arteries. Skull and upper cervical spine: The craniocervical junction is normal. Upper cervical spine is within normal limits. Marrow signal is unremarkable. Sinuses/Orbits: The paranasal sinuses and mastoid air cells are clear. The globes and orbits are within normal limits. IMPRESSION: 1. No acute intracranial abnormality. 2. Atrophy is mildly advanced for age. This may reflect the sequela of chronic microvascular  ischemia. Electronically Signed   By: San Morelle M.D.   On: 08/14/2020 12:47    Labs: BNP (last 3 results) Recent Labs    07/29/20 0900 08/13/20 1700  BNP 216.1* [id number]*   Basic Metabolic Panel: Recent Labs  Lab 08/13/20 1145 08/14/20 0657 08/15/20 0537  NA 133* 144 142  K 5.0 4.2 3.5  CL 94* 105 101  CO2 33* 33* 35*  GLUCOSE 110* 63* 79  BUN 12 9 8   CREATININE 0.65 0.61 0.53*  CALCIUM 8.9 8.5* 8.7*   Liver Function Tests: No results for input(s): AST, ALT, ALKPHOS, BILITOT, PROT, ALBUMIN in the last 168 hours. No results for input(s): LIPASE, AMYLASE in the last 168 hours. No results for input(s): AMMONIA in the last 168 hours. CBC: Recent Labs  Lab 08/13/20  1145 08/14/20 0657 08/15/20 0537  WBC 14.9* 5.4 8.7  HGB 11.0* 9.1* 9.3*  HCT 35.4* 29.0* 28.9*  MCV 95.2 95.4 94.1  PLT 218 158 152   Cardiac Enzymes: No results for input(s): CKTOTAL, CKMB, CKMBINDEX, TROPONINI in the last 168 hours. BNP: Invalid input(s): POCBNP CBG: Recent Labs  Lab 08/15/20 0745 08/15/20 1131 08/15/20 1644 08/15/20 2201 08/16/20 0731  GLUCAP 79 136* 134* 148* 95   D-Dimer No results for input(s): DDIMER in the last 72 hours. Hgb A1c Recent Labs    08/14/20 0657  HGBA1C 5.5   Lipid Profile No results for input(s): CHOL, HDL, LDLCALC, TRIG, CHOLHDL, LDLDIRECT in the last 72 hours. Thyroid function studies No results for input(s): TSH, T4TOTAL, T3FREE, THYROIDAB in the last 72 hours.  Invalid input(s): FREET3 Anemia work up No results for input(s): VITAMINB12, FOLATE, FERRITIN, TIBC, IRON, RETICCTPCT in the last 72 hours. Urinalysis    Component Value Date/Time   COLORURINE YELLOW (A) 05/03/2018 1619   APPEARANCEUR CLEAR (A) 05/03/2018 1619   APPEARANCEUR Clear 11/18/2014 1402   LABSPEC 1.015 05/03/2018 1619   LABSPEC 1.013 11/18/2014 1402   PHURINE 6.0 05/03/2018 1619   GLUCOSEU NEGATIVE 05/03/2018 1619   GLUCOSEU 150 mg/dL 11/18/2014 1402   HGBUR  NEGATIVE 05/03/2018 1619   BILIRUBINUR NEGATIVE 05/03/2018 1619   BILIRUBINUR Negative 11/18/2014 1402   KETONESUR NEGATIVE 05/03/2018 1619   PROTEINUR NEGATIVE 05/03/2018 1619   UROBILINOGEN 0.2 01/03/2010 0253   NITRITE NEGATIVE 05/03/2018 1619   LEUKOCYTESUR NEGATIVE 05/03/2018 1619   LEUKOCYTESUR Negative 11/18/2014 1402   Sepsis Labs Invalid input(s): PROCALCITONIN,  WBC,  LACTICIDVEN Microbiology Recent Results (from the past 240 hour(s))  Blood culture (routine x 2)     Status: None (Preliminary result)   Collection Time: 08/13/20  4:28 PM   Specimen: BLOOD  Result Value Ref Range Status   Specimen Description BLOOD LEFT ANTECUBITAL  Final   Special Requests   Final    BOTTLES DRAWN AEROBIC AND ANAEROBIC Blood Culture adequate volume   Culture   Final    NO GROWTH 3 DAYS Performed at [facility], [address] [place], [place] [address]    Report Status PENDING  Incomplete  Resp Panel by RT-PCR (Flu A&B, Covid) Nasopharyngeal Swab     Status: None   Collection Time: 08/13/20  4:44 PM   Specimen: Nasopharyngeal Swab; Nasopharyngeal(NP) swabs in vial transport medium  Result Value Ref Range Status   SARS Coronavirus 2 by RT PCR NEGATIVE NEGATIVE Final    Comment: (NOTE) SARS-CoV-2 target nucleic acids are NOT DETECTED.  The SARS-CoV-2 RNA is generally detectable in upper respiratory specimens during the acute phase of infection. The lowest concentration of SARS-CoV-2 viral copies this assay can detect is 138 copies/mL. A negative result does not preclude SARS-Cov-2 infection and should not be used as the sole basis for treatment or other patient management decisions. A negative result may occur with  improper specimen collection/handling, submission of specimen other than nasopharyngeal swab, presence of viral mutation(s) within the areas targeted by this assay, and inadequate number of viral copies(<138 copies/mL). A negative result must be combined  with clinical observations, patient history, and epidemiological information. The expected result is Negative.  Fact Sheet for Patients:  EntrepreneurPulse.com.au  Fact Sheet for Healthcare Providers:  IncredibleEmployment.be  This test is no t yet approved or cleared by the [place] FDA and  has been authorized for detection and/or diagnosis of SARS-CoV-2 by FDA under an Emergency  Use Authorization (EUA). This EUA will remain  in effect (meaning this test can be used) for the duration of the COVID-19 declaration under Section 564(b)(1) of the Act, 21 U.S.C.section 360bbb-3(b)(1), unless the authorization is terminated  or revoked sooner.       Influenza A by PCR NEGATIVE NEGATIVE Final   Influenza B by PCR NEGATIVE NEGATIVE Final    Comment: (NOTE) The Xpert Xpress SARS-CoV-2/FLU/RSV plus assay is intended as an aid in the diagnosis of influenza from Nasopharyngeal swab specimens and should not be used as a sole basis for treatment. Nasal washings and aspirates are unacceptable for Xpert Xpress SARS-CoV-2/FLU/RSV testing.  Fact Sheet for Patients: EntrepreneurPulse.com.au  Fact Sheet for Healthcare Providers: IncredibleEmployment.be  This test is not yet approved or cleared by the [place] FDA and has been authorized for detection and/or diagnosis of SARS-CoV-2 by FDA under an Emergency Use Authorization (EUA). This EUA will remain in effect (meaning this test can be used) for the duration of the COVID-19 declaration under Section 564(b)(1) of the Act, 21 U.S.C. section 360bbb-3(b)(1), unless the authorization is terminated or revoked.  Performed at [facility], [address] [place], [place] [address]   Blood culture (routine x 2)     Status: None (Preliminary result)   Collection Time: 08/13/20  4:44 PM   Specimen: BLOOD  Result Value Ref Range Status   Specimen  Description BLOOD BLOOD LEFT WRIST  Final   Special Requests   Final    BOTTLES DRAWN AEROBIC AND ANAEROBIC Blood Culture adequate volume   Culture   Final    NO GROWTH 3 DAYS Performed at [facility], [place], [place] [address]    Report Status PENDING  Incomplete     Time coordinating discharge: 25 minutes  SIGNED: Antonieta Pert, MD  [facility] 08/16/2020, 10:10 AM  If 7PM-7AM, please contact night-coverage [email or web address]

## 2020-08-18 LAB — CULTURE, BLOOD (ROUTINE X 2)
Culture: NO GROWTH
Culture: NO GROWTH
Special Requests: ADEQUATE
Special Requests: ADEQUATE

## 2020-09-26 ENCOUNTER — Ambulatory Visit: Payer: Medicaid Other | Admitting: Gastroenterology

## 2020-10-22 ENCOUNTER — Other Ambulatory Visit: Payer: Self-pay

## 2020-10-22 ENCOUNTER — Encounter: Payer: Self-pay | Admitting: Gastroenterology

## 2020-10-22 ENCOUNTER — Ambulatory Visit (INDEPENDENT_AMBULATORY_CARE_PROVIDER_SITE_OTHER): Payer: Medicaid Other | Admitting: Gastroenterology

## 2020-10-22 VITALS — BP 137/73 | HR 80 | Temp 98.7°F | Ht 72.0 in | Wt 124.2 lb

## 2020-10-22 DIAGNOSIS — M545 Low back pain, unspecified: Secondary | ICD-10-CM | POA: Insufficient documentation

## 2020-10-22 DIAGNOSIS — Z720 Tobacco use: Secondary | ICD-10-CM | POA: Insufficient documentation

## 2020-10-22 DIAGNOSIS — R1319 Other dysphagia: Secondary | ICD-10-CM

## 2020-10-22 DIAGNOSIS — F32A Depression, unspecified: Secondary | ICD-10-CM | POA: Insufficient documentation

## 2020-10-22 NOTE — Progress Notes (Signed)
Primary Care Physician: Secundino Ginger, PA-C  Primary Gastroenterologist:  Dr. [name]  Chief Complaint  Patient presents with  . Hospitalization Follow-up    HPI: Mark [name] is a 65 y.o. male here for follow-up after being seen in December in the emergency room for aspiration pneumonia.  The patient has had a history of dysphagia with esophageal strictures being dilated.  The patient's last dilation was with a balloon to 15 mm and was recommended to have a repeat upper endoscopy if his symptoms should recur.  On the patient's discharge instructions it was recommended that the patient follow-up with his gastroenterologist.  His last upper endoscopy was in September 2021. The patient reports that his swallowing has been good recently and has not had any problems with choking or swallowing since being in the hospital with the aspiration.  He also reports that he is gaining weight.  The patient states he feels better since he quit smoking but has gone back to having 1 cigarette a day.  Past Medical History:  Diagnosis Date  . Arthritis    left hand  . Aspiration pneumonia ([place])   . Cancer ([place])    tonsilal ca  . COPD (chronic obstructive pulmonary disease) ([place])   . Incontinence    night time, began recently  . Memory deficit 11/18/2014  . PEG tube 11/18/2014   removed summer 2017  . Peripheral neuropathy 11/18/2014  . Smokers' cough ([place])   . Tonsillar cancer, S/P surgery, chemotherapy XRT 2009-followed at [facility] 11/18/2014  . Wears dentures    full upper and lower    Current Outpatient Medications  Medication Sig Dispense Refill  . albuterol (PROVENTIL HFA;VENTOLIN HFA) 108 (90 BASE) MCG/ACT inhaler Inhale 2 puffs into the lungs every 6 (six) hours as needed for wheezing or shortness of breath.     . ARIPiprazole (ABILIFY) 2 MG tablet Take 2 mg by mouth daily.     Marland Kitchen aspirin EC 81 MG tablet 81 mg.    . BREZTRI AEROSPHERE 160-9-4.8 MCG/ACT AERO INHALE 2 INHALATIONS INTO  THE LUNGS 2 TIMES DAILY    . budesonide-formoterol (SYMBICORT) 160-4.5 MCG/ACT inhaler Inhale into the lungs.    Marland Kitchen buPROPion (WELLBUTRIN SR) 150 MG 12 hr tablet 150 mg.    . clonazePAM (KLONOPIN) 1 MG tablet Take 1 mg by mouth 2 (two) times daily.    Marland Kitchen DALIRESP 250 MCG TABS Take 1 tablet by mouth daily.    . DULoxetine (CYMBALTA) 60 MG capsule Take 60 mg by mouth daily.    . fluticasone (FLONASE) 50 MCG/ACT nasal spray SPRAY 2 SPRAYS INTO EACH NOSTRIL EVERY DAY    . Ipratropium-Albuterol (COMBIVENT) 20-100 MCG/ACT AERS respimat Inhale 2 puffs into the lungs 2 (two) times daily.    . midodrine (PROAMATINE) 5 MG tablet Take 5 mg by mouth 3 (three) times daily.    . mometasone (NASONEX) 50 MCG/ACT nasal spray Place 2 sprays into both nostrils 2 (two) times daily.    . Multiple Vitamin (MULTIVITAMIN WITH MINERALS) TABS tablet Take 1 tablet by mouth daily. 30 tablet 0  . naloxone (NARCAN) nasal spray 4 mg/0.1 mL SPRAY 1 SPRAY INTO ONE NOSTRIL AS DIRECTED FOR OPIOID OVERDOSE (TURN PERSON ON SIDE AFTER DOSE. IF NO RESPONSE IN 2-3 MINUTES OR PERSON RESPONDS BUT RELAPSES, REPEAT USING A NEW SPRAY DEVICE AND SPRAY INTO THE OTHER NOSTRIL. CALL 911 AFTER USE.) * EMERGENCY USE ONLY *    . nicotine (NICODERM CQ - DOSED IN  MG/24 HOURS) 14 mg/24hr patch 14 mg daily.    . Nutritional Supplements (FEEDING SUPPLEMENT, NEPRO CARB STEADY,) LIQD Take 237 mLs by mouth 3 (three) times daily between meals. 90 Can 0  . Oxycodone HCl 20 MG TABS Take 20 mg by mouth 4 (four) times daily.    . pantoprazole (PROTONIX) 40 MG tablet Take 40 mg by mouth at bedtime.     . pravastatin (PRAVACHOL) 20 MG tablet SMARTSIG:1 Tablet(s) By Mouth Every Evening    . predniSONE (DELTASONE) 10 MG tablet Take 10 mg by mouth daily.    . pregabalin (LYRICA) 150 MG capsule Take 1 capsule by mouth 2 (two) times daily.  0  . STIOLTO RESPIMAT 2.5-2.5 MCG/ACT AERS INHALE 2 INHALATIONS INTO THE LUNGS ONCE DAILY    . traZODone (DESYREL) 150 MG tablet  Take 150 mg by mouth at bedtime.     Marland Kitchen venlafaxine (EFFEXOR) 37.5 MG tablet Take 37.5 mg by mouth 2 (two) times daily with a meal.     . vitamin C (VITAMIN C) 250 MG tablet Take 1 tablet (250 mg total) by mouth 2 (two) times daily. 60 tablet 0   No current facility-administered medications for this visit.    Allergies as of 10/22/2020 - Review Complete 10/22/2020  Allergen Reaction Noted  . Morphine and related Nausea And Vomiting 11/18/2014    ROS:  General: Negative for anorexia, weight loss, fever, chills, fatigue, weakness. ENT: Negative for hoarseness, difficulty swallowing , nasal congestion. CV: Negative for chest pain, angina, palpitations, dyspnea on exertion, peripheral edema.  Respiratory: Negative for dyspnea at rest, dyspnea on exertion, cough, sputum, wheezing.  GI: See history of present illness. GU:  Negative for dysuria, hematuria, urinary incontinence, urinary frequency, nocturnal urination.  Endo: Negative for unusual weight change.    Physical Examination:   BP 137/73   Pulse 80   Temp 98.7 F (37.1 C) (Temporal)   Ht 6' (1.829 m)   Wt 124 lb 3.2 oz (56.3 kg)   BMI 16.84 kg/m   General: Well-nourished, well-developed in no acute distress.  Eyes: No icterus. Conjunctivae pink. Heart: Regular rate and rhythm, no murmurs rubs or gallops.  Abdomen: Bowel sounds are normal, nontender, nondistended, no hepatosplenomegaly or masses, no abdominal bruits or hernia , no rebound or guarding.   Extremities: No lower extremity edema. No clubbing or deformities. Neuro: Alert and oriented x 3.  Grossly intact. Skin: Warm and dry, no jaundice.   Psych: Alert and cooperative, normal mood and affect.  Labs:    Imaging Studies: No results found.  Assessment and Plan:   Mark [name] is a 65 y.o. y/o male who comes in with a history of dysphagia and aspiration.  The patient states he is doing well at the present time without any worsening of his dysphagia.  The  patient has been told that if his symptoms should come back he should contact me for a repeat upper endoscopy with dilation.  The patient states he is gaining weight at the present time.  The patient will follow up as needed.     [name], MD. Marval Regal    Note: This dictation was prepared with Dragon dictation along with smaller phrase technology. Any transcriptional errors that result from this process are unintentional.

## 2020-10-25 ENCOUNTER — Other Ambulatory Visit: Payer: Self-pay

## 2020-10-25 ENCOUNTER — Ambulatory Visit: Payer: Self-pay | Admitting: Podiatry

## 2020-10-25 ENCOUNTER — Ambulatory Visit: Payer: Medicaid Other | Admitting: Podiatry

## 2020-10-25 ENCOUNTER — Encounter: Payer: Self-pay | Admitting: Podiatry

## 2020-10-25 DIAGNOSIS — M792 Neuralgia and neuritis, unspecified: Secondary | ICD-10-CM

## 2020-10-25 DIAGNOSIS — M2042 Other hammer toe(s) (acquired), left foot: Secondary | ICD-10-CM

## 2020-10-25 DIAGNOSIS — M2041 Other hammer toe(s) (acquired), right foot: Secondary | ICD-10-CM

## 2020-10-25 MED ORDER — NONFORMULARY OR COMPOUNDED ITEM: 2 refills | Status: AC

## 2020-10-25 NOTE — Progress Notes (Signed)
Subjective:  Patient ID: Mark [name], male    DOB: 01/06/1956,  MRN: [id number]  Chief Complaint  Patient presents with  . Peripheral Neuropathy    Patient presents today for RFC, no nail trim and neuropathy bilat feet/toes secondary to Chemo treatments    65 y.o. male presents with the above complaint.  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  Patient states it is very painful at all times.  He has he has been placed on gabapentin before which has not helped now he is on Lyrica which is also not helping.  He states is try to manage as best as he can.  He is here to see if there is anything else that could be an option.  He would also like to obtain shoes for neuropathy to help evenly distribute the pressure.  He does have hammertoe contractures that can lead to ulceration and loss of amputation.  He denies any other acute complaints.   Review of Systems: Negative except as noted in the HPI. Denies N/V/F/Ch.  Past Medical History:  Diagnosis Date  . Arthritis    left hand  . Aspiration pneumonia ([place])   . Cancer ([place])    tonsilal ca  . COPD (chronic obstructive pulmonary disease) ([place])   . Incontinence    night time, began recently  . Memory deficit 11/18/2014  . PEG tube 11/18/2014   removed summer 2017  . Peripheral neuropathy 11/18/2014  . Smokers' cough ([place])   . Tonsillar cancer, S/P surgery, chemotherapy XRT 2009-followed at [facility] 11/18/2014  . Wears dentures    full upper and lower    Current Outpatient Medications:  .  NONFORMULARY OR COMPOUNDED ITEM, See pharmacy note, Disp: 120 each, Rfl: 2 .  albuterol (PROVENTIL HFA;VENTOLIN HFA) 108 (90 BASE) MCG/ACT inhaler, Inhale 2 puffs into the lungs every 6 (six) hours as needed for wheezing or shortness of breath. , Disp: , Rfl:  .  ARIPiprazole (ABILIFY) 2 MG tablet, Take 2 mg by mouth daily. , Disp: , Rfl:  .  BREZTRI AEROSPHERE 160-9-4.8 MCG/ACT AERO, INHALE 2 INHALATIONS  INTO THE LUNGS 2 TIMES DAILY, Disp: , Rfl:  .  buPROPion (WELLBUTRIN SR) 150 MG 12 hr tablet, 150 mg., Disp: , Rfl:  .  clonazePAM (KLONOPIN) 1 MG tablet, Take 1 mg by mouth 2 (two) times daily., Disp: , Rfl:  .  DALIRESP 250 MCG TABS, Take 1 tablet by mouth daily., Disp: , Rfl:  .  fluticasone (FLONASE) 50 MCG/ACT nasal spray, SPRAY 2 SPRAYS INTO EACH NOSTRIL EVERY DAY, Disp: , Rfl:  .  midodrine (PROAMATINE) 5 MG tablet, Take 5 mg by mouth 3 (three) times daily., Disp: , Rfl:  .  mometasone (NASONEX) 50 MCG/ACT nasal spray, Place 2 sprays into both nostrils 2 (two) times daily., Disp: , Rfl:  .  Multiple Vitamin (MULTIVITAMIN WITH MINERALS) TABS tablet, Take 1 tablet by mouth daily., Disp: 30 tablet, Rfl: 0 .  naloxone (NARCAN) nasal spray 4 mg/0.1 mL, SPRAY 1 SPRAY INTO ONE NOSTRIL AS DIRECTED FOR OPIOID OVERDOSE (TURN PERSON ON SIDE AFTER DOSE. IF NO RESPONSE IN 2-3 MINUTES OR PERSON RESPONDS BUT RELAPSES, REPEAT USING A NEW SPRAY DEVICE AND SPRAY INTO THE OTHER NOSTRIL. CALL 911 AFTER USE.) * EMERGENCY USE ONLY *, Disp: , Rfl:  .  nicotine (NICODERM CQ - DOSED IN MG/24 HOURS) 14 mg/24hr patch, 14 mg daily., Disp: , Rfl:  .  Nutritional Supplements (FEEDING SUPPLEMENT, NEPRO CARB STEADY,)  LIQD, Take 237 mLs by mouth 3 (three) times daily between meals., Disp: 90 Can, Rfl: 0 .  Oxycodone HCl 20 MG TABS, Take 20 mg by mouth 4 (four) times daily., Disp: , Rfl:  .  pantoprazole (PROTONIX) 40 MG tablet, Take 40 mg by mouth at bedtime. , Disp: , Rfl:  .  pravastatin (PRAVACHOL) 20 MG tablet, SMARTSIG:1 Tablet(s) By Mouth Every Evening, Disp: , Rfl:  .  pregabalin (LYRICA) 150 MG capsule, Take 1 capsule by mouth 2 (two) times daily., Disp: , Rfl: 0 .  STIOLTO RESPIMAT 2.5-2.5 MCG/ACT AERS, INHALE 2 INHALATIONS INTO THE LUNGS ONCE DAILY, Disp: , Rfl:  .  traZODone (DESYREL) 150 MG tablet, Take 150 mg by mouth at bedtime. , Disp: , Rfl:  .  venlafaxine (EFFEXOR) 37.5 MG tablet, Take 37.5 mg by mouth 2  (two) times daily with a meal. , Disp: , Rfl:  .  vitamin C (VITAMIN C) 250 MG tablet, Take 1 tablet (250 mg total) by mouth 2 (two) times daily., Disp: 60 tablet, Rfl: 0  Social History   Tobacco Use  Smoking Status Current Every Day Smoker  . Years: 40.00  Smokeless Tobacco Never Used  Tobacco Comment   was 2 PPD, now 2 cigs/day    Allergies  Allergen Reactions  . Morphine And Related Nausea And Vomiting   Objective:  There were no vitals filed for this visit. There is no height or weight on file to calculate BMI. Constitutional Well developed. Well nourished.  Vascular Dorsalis pedis pulses palpable bilaterally. Posterior tibial pulses palpable bilaterally. Capillary refill normal to all digits.  No cyanosis or clubbing noted. Pedal hair growth normal.  Neurologic Normal speech. Oriented to person, place, and time. Decreased sensation to light touch grossly present bilaterally.  Subjective component of loss of protective sensation noted noted.  Subjective neuropathic pain noted to both lower extremity  Dermatologic  nails within normal limits.  No open lesions or wounds noted.  Orthopedic: Normal joint ROM without pain or crepitus bilaterally.  Hammertoe contractures noted 2 through 5 bilaterally semi-flexible in nature.  No open lesions or wounds noted No visible deformities. No bony tenderness.   Radiographs: None Assessment:   1. Hammertoes of both feet   2. Neuropathic pain    Plan:  Patient was evaluated and treated and all questions answered.  Bilateral numbness and tingling with underlying history of chemotherapy -I explained to the patient the etiology of numbness and tingling and etiology of the neuropathic pain that he is having.  Given the amount of pain that is having he is already on medication orally to help with that.  I believe patient may benefit from topical application of the neuropathic cream from specialty pharmacy.  New prescription to the  specialty pharmacy was sent in to obtain neuropathic cream.  I have asked him to apply twice a day.  He states understanding.  Hammertoe contractures 2 through 5 bilaterally -I explained the patient the etiology of hammertoe contracture and various treatment options were discussed.  Given the nature of the contracture in the setting of neuropathy with underlying chemotherapy I believe patient will benefit from custom shoes to help the take the pressure off of the hammertoe contractures and evenly distribute the pressure. -Prescription was given for custom shoes with insoles.  No follow-ups on file.

## 2021-05-07 ENCOUNTER — Telehealth: Payer: Self-pay | Admitting: Gastroenterology

## 2021-05-07 ENCOUNTER — Other Ambulatory Visit: Payer: Self-pay

## 2021-05-07 DIAGNOSIS — R4702 Dysphasia: Secondary | ICD-10-CM

## 2021-05-07 NOTE — Telephone Encounter (Signed)
Returned pt's call and scheduled him for an EGD with Dr. Vicente Males on 9/22. Pt advised of instructions.

## 2021-05-07 NOTE — Telephone Encounter (Signed)
Pt. Requesting a call back he is a new patient of  Dr. Allen Norris and he says that he can not swallow at all. He would like to get a call back to talk to someone because he does not know what is going on

## 2021-05-08 ENCOUNTER — Encounter: Payer: Self-pay | Admitting: Gastroenterology

## 2021-05-09 ENCOUNTER — Ambulatory Visit: Payer: Medicare Other | Admitting: Anesthesiology

## 2021-05-09 ENCOUNTER — Encounter
Admission: RE | Disposition: A | Discharge status: Home / Self Care | Payer: Self-pay | Source: Ambulatory Visit | Attending: Gastroenterology

## 2021-05-09 ENCOUNTER — Ambulatory Visit
Admission: RE | Admit: 2021-05-09 | Discharge: 2021-05-09 | Disposition: A | Discharge status: Home / Self Care | Payer: Medicare Other | Source: Ambulatory Visit | Attending: Gastroenterology | Admitting: Gastroenterology

## 2021-05-09 ENCOUNTER — Encounter: Payer: Self-pay | Admitting: Gastroenterology

## 2021-05-09 DIAGNOSIS — Z85818 Personal history of malignant neoplasm of other sites of lip, oral cavity, and pharynx: Secondary | ICD-10-CM | POA: Insufficient documentation

## 2021-05-09 DIAGNOSIS — K224 Dyskinesia of esophagus: Secondary | ICD-10-CM | POA: Diagnosis not present

## 2021-05-09 DIAGNOSIS — Z9049 Acquired absence of other specified parts of digestive tract: Secondary | ICD-10-CM | POA: Diagnosis not present

## 2021-05-09 DIAGNOSIS — Z79899 Other long term (current) drug therapy: Secondary | ICD-10-CM | POA: Insufficient documentation

## 2021-05-09 DIAGNOSIS — F17210 Nicotine dependence, cigarettes, uncomplicated: Secondary | ICD-10-CM | POA: Diagnosis not present

## 2021-05-09 DIAGNOSIS — G629 Polyneuropathy, unspecified: Secondary | ICD-10-CM | POA: Diagnosis not present

## 2021-05-09 DIAGNOSIS — R1310 Dysphagia, unspecified: Secondary | ICD-10-CM | POA: Insufficient documentation

## 2021-05-09 DIAGNOSIS — J449 Chronic obstructive pulmonary disease, unspecified: Secondary | ICD-10-CM | POA: Insufficient documentation

## 2021-05-09 DIAGNOSIS — Z8043 Family history of malignant neoplasm of testis: Secondary | ICD-10-CM | POA: Diagnosis not present

## 2021-05-09 DIAGNOSIS — Z77090 Contact with and (suspected) exposure to asbestos: Secondary | ICD-10-CM | POA: Diagnosis not present

## 2021-05-09 DIAGNOSIS — Z885 Allergy status to narcotic agent status: Secondary | ICD-10-CM | POA: Insufficient documentation

## 2021-05-09 DIAGNOSIS — R4702 Dysphasia: Secondary | ICD-10-CM

## 2021-05-09 DIAGNOSIS — K222 Esophageal obstruction: Secondary | ICD-10-CM | POA: Diagnosis not present

## 2021-05-09 DIAGNOSIS — Z800 Family history of malignant neoplasm of digestive organs: Secondary | ICD-10-CM | POA: Diagnosis not present

## 2021-05-09 HISTORY — PX: ESOPHAGOGASTRODUODENOSCOPY (EGD) WITH PROPOFOL: SHX5813

## 2021-05-09 SURGERY — ESOPHAGOGASTRODUODENOSCOPY (EGD) WITH PROPOFOL
Anesthesia: General

## 2021-05-09 MED ORDER — SODIUM CHLORIDE 0.9 % IV SOLN: INTRAVENOUS | Status: DC

## 2021-05-09 MED ORDER — PROPOFOL 10 MG/ML IV BOLUS
INTRAVENOUS | Status: DC | PRN
  Administered 2021-05-09: 70 mg via INTRAVENOUS

## 2021-05-09 MED ORDER — PROPOFOL 500 MG/50ML IV EMUL
INTRAVENOUS | Status: DC | PRN
  Administered 2021-05-09: 170 ug/kg/min via INTRAVENOUS

## 2021-05-09 NOTE — Addendum Note (Signed)
Addendum  created 05/09/21 1248 by Nelda Marseille, CRNA   Intraprocedure Event edited

## 2021-05-09 NOTE — Transfer of Care (Signed)
Immediate Anesthesia Transfer of Care Note  Patient: Mark [name]  Procedure(s) Performed: ESOPHAGOGASTRODUODENOSCOPY (EGD) WITH PROPOFOL  Patient Location: PACU  Anesthesia Type:General  Level of Consciousness: sedated  Airway & Oxygen Therapy: Patient Spontanous Breathing and Patient connected to nasal cannula oxygen  Post-op Assessment: Report given to RN and Post -op Vital signs reviewed and stable  Post vital signs: Reviewed and stable  Last Vitals:  Vitals Value Taken Time  BP    Temp    Pulse 66 05/09/21 1133  Resp 12 05/09/21 1133  SpO2 100 % 05/09/21 1133    Last Pain:  Vitals:   05/09/21 1033  TempSrc: Temporal         Complications: No notable events documented.

## 2021-05-09 NOTE — H&P (Signed)
Jonathon Bellows, MD [address] [place], [place], [place], [address] [address], [place], [place], [place], [address] Phone: [phone]  Fax: [phone]  Primary Care Physician:  Secundino Ginger, PA-C   Pre-Procedure History & Physical: HPI:  Mark [name] is a 65 y.o. male is here for an endoscopy    Past Medical History:  Diagnosis Date   Arthritis    left hand   Aspiration pneumonia ([place])    Cancer ([place])    tonsilal ca   COPD (chronic obstructive pulmonary disease) ([place])    Incontinence    night time, began recently   Memory deficit 11/18/2014   PEG tube 11/18/2014   removed summer 2017   Peripheral neuropathy 11/18/2014   Smokers' cough ([place])    Tonsillar cancer, S/P surgery, chemotherapy XRT 2009-followed at [facility] 11/18/2014   Wears dentures    full upper and lower    Past Surgical History:  Procedure Laterality Date   APPENDECTOMY     CHOLECYSTECTOMY  2007   ESOPHAGEAL DILATION N/A 08/01/2016   Procedure: ESOPHAGEAL DILATION;  Surgeon: [name], MD;  Location: [facility];  Service: Endoscopy;  Laterality: N/A;   ESOPHAGEAL DILATION N/A 02/19/2017   Procedure: ESOPHAGEAL DILATION;  Surgeon: [name], MD;  Location: [facility] [place];  Service: Endoscopy;  Laterality: N/A;   ESOPHAGEAL DILATION N/A 08/27/2017   Procedure: ESOPHAGEAL DILATION;  Surgeon: [name], MD;  Location: [place];  Service: Endoscopy;  Laterality: N/A;   ESOPHAGEAL DILATION  04/26/2020   Procedure: ESOPHAGEAL DILATION;  Surgeon: [name], MD;  Location: [facility];  Service: Endoscopy;;   ESOPHAGOGASTRODUODENOSCOPY (EGD) WITH PROPOFOL N/A 01/24/2016   Procedure: ESOPHAGOGASTRODUODENOSCOPY (EGD) WITH gastric biopsy and esophageal dilation.;  Surgeon: [name], MD;  Location: [facility];  Service: Endoscopy;  Laterality: N/A;   ESOPHAGOGASTRODUODENOSCOPY (EGD) WITH PROPOFOL N/A 08/01/2016   Procedure: ESOPHAGOGASTRODUODENOSCOPY  (EGD) WITH PROPOFOL;  Surgeon: [name], MD;  Location: [facility];  Service: Endoscopy;  Laterality: N/A;   ESOPHAGOGASTRODUODENOSCOPY (EGD) WITH PROPOFOL N/A 02/19/2017   Procedure: ESOPHAGOGASTRODUODENOSCOPY (EGD) WITH PROPOFOL;  Surgeon: [name], MD;  Location: [facility];  Service: Endoscopy;  Laterality: N/A;   ESOPHAGOGASTRODUODENOSCOPY (EGD) WITH PROPOFOL N/A 08/27/2017   Procedure: ESOPHAGOGASTRODUODENOSCOPY (EGD) WITH PROPOFOL;  Surgeon: [name], MD;  Location: [place];  Service: Endoscopy;  Laterality: N/A;   ESOPHAGOGASTRODUODENOSCOPY (EGD) WITH PROPOFOL N/A 03/26/2018   Procedure: ESOPHAGOGASTRODUODENOSCOPY (EGD) WITH PROPOFOL;  Surgeon: Jonathon Bellows, MD;  [facility] ENDOSCOPY;  Service: Gastroenterology;  Laterality: N/A;   ESOPHAGOGASTRODUODENOSCOPY (EGD) WITH PROPOFOL N/A 04/22/2018   Procedure: ESOPHAGOGASTRODUODENOSCOPY (EGD) WITH PROPOFOL;  Surgeon: [name], MD;  Location: [facility];  Service: Endoscopy;  Laterality: N/A;   ESOPHAGOGASTRODUODENOSCOPY (EGD) WITH PROPOFOL N/A 04/26/2020   Procedure: ESOPHAGOGASTRODUODENOSCOPY (EGD) WITH PROPOFOL;  Surgeon: [name], MD;  Location: [facility];  Service: Endoscopy;  Laterality: N/A;   ESOPHAGOSCOPY WITH DILITATION N/A 04/22/2018   Procedure: ESOPHAGOSCOPY WITH DILITATION;  Surgeon: [name], MD;  Location: [facility];  Service: Endoscopy;  Laterality: N/A;   PEG PLACEMENT N/A 11/28/2015   Procedure: PERCUTANEOUS ENDOSCOPIC GASTROSTOMY (PEG) PLACEMENT;  Surgeon: [name], MD;  Location: [facility];  Service: Endoscopy;  Laterality: N/A;   PEG TUBE PLACEMENT  10/22/12   10/22/12 - [facility], 10/23/14 - [facility], Dr. Allen Norris, replaced   TONSILLECTOMY     jan 2010--stage IV    Prior to Admission medications  Medication Sig Start Date End Date Taking? Authorizing Provider  albuterol (PROVENTIL HFA;VENTOLIN HFA) 108 (90 BASE) MCG/ACT inhaler Inhale 2 puffs into the  lungs every 6 (six) hours as needed for wheezing or shortness of breath.    Yes [provider]  ARIPiprazole (ABILIFY) 2 MG tablet Take 2 mg by mouth daily.    Yes [provider]  BREZTRI AEROSPHERE 160-9-4.8 MCG/ACT AERO INHALE 2 INHALATIONS INTO THE LUNGS 2 TIMES DAILY 10/04/20  Yes [provider]  buPROPion (WELLBUTRIN SR) 150 MG 12 hr tablet 150 mg.   Yes [provider]  clonazePAM (KLONOPIN) 1 MG tablet Take 1 mg by mouth 2 (two) times daily.   Yes [provider]  DALIRESP 250 MCG TABS Take 1 tablet by mouth daily. 09/25/20  Yes [provider]  midodrine (PROAMATINE) 5 MG tablet Take 5 mg by mouth 3 (three) times daily. 09/25/20  Yes [provider]  Multiple Vitamin (MULTIVITAMIN WITH MINERALS) TABS tablet Take 1 tablet by mouth daily. 05/05/18  Yes Wieting, Richard, MD  nicotine (NICODERM CQ - DOSED IN MG/24 HOURS) 14 mg/24hr patch 14 mg daily. 10/19/20  Yes [provider]  Oxycodone HCl 20 MG TABS Take 20 mg by mouth 4 (four) times daily.   Yes [provider]  pantoprazole (PROTONIX) 40 MG tablet Take 40 mg by mouth at bedtime.    Yes [provider]  pravastatin (PRAVACHOL) 20 MG tablet SMARTSIG:1 Tablet(s) By Mouth Every Evening 10/10/20  Yes [provider]  pregabalin (LYRICA) 150 MG capsule Take 1 capsule by mouth 2 (two) times daily. 04/13/18  Yes [provider]  traZODone (DESYREL) 150 MG tablet Take 150 mg by mouth at bedtime.    Yes [provider]  venlafaxine (EFFEXOR) 37.5 MG tablet Take 37.5 mg by mouth 2 (two) times daily with a meal.    Yes [provider]  vitamin C (VITAMIN C) 250 MG tablet Take 1 tablet (250 mg total) by mouth 2 (two) times daily. 05/05/18  Yes Wieting, Richard, MD  fluticasone (FLONASE) 50 MCG/ACT nasal spray SPRAY 2 SPRAYS INTO EACH NOSTRIL EVERY DAY Patient not taking: Reported on 05/09/2021 09/17/20   [provider]   mometasone (NASONEX) 50 MCG/ACT nasal spray Place 2 sprays into both nostrils 2 (two) times daily. Patient not taking: Reported on 05/09/2021    [provider]  naloxone (NARCAN) nasal spray 4 mg/0.1 mL SPRAY 1 SPRAY INTO ONE NOSTRIL AS DIRECTED FOR OPIOID OVERDOSE (TURN PERSON ON SIDE AFTER DOSE. IF NO RESPONSE IN 2-3 MINUTES OR PERSON RESPONDS BUT RELAPSES, REPEAT USING A NEW SPRAY DEVICE AND SPRAY INTO THE OTHER NOSTRIL. CALL 911 AFTER USE.) * EMERGENCY USE ONLY * 10/01/20   [provider]  NONFORMULARY OR COMPOUNDED ITEM See pharmacy note 10/25/20   Felipa Furnace, DPM  Nutritional Supplements (FEEDING SUPPLEMENT, NEPRO CARB STEADY,) LIQD Take 237 mLs by mouth 3 (three) times daily between meals. 05/05/18   Loletha Grayer, MD  STIOLTO RESPIMAT 2.5-2.5 MCG/ACT AERS INHALE 2 INHALATIONS INTO THE LUNGS ONCE DAILY Patient not taking: Reported on 05/09/2021 10/04/20   [provider]    Allergies as of 05/07/2021 - Review Complete 10/25/2020  Allergen Reaction Noted   Morphine and related Nausea And Vomiting 11/18/2014    Family History  Problem Relation Age of Onset   Pancreatic cancer Mother    Stroke Father    Testicular cancer Father    Suicidality Father  Social History   Socioeconomic History   Marital status: Married    Spouse name: Not on file   Number of children: Not on file   Years of education: Not on file   Highest education level: Not on file  Occupational History   Not on file  Tobacco Use   Smoking status: Every Day    Years: 40.00    Types: Cigarettes   Smokeless tobacco: Never   Tobacco comments:    was 2 PPD, now 2 cigs/day  Vaping Use   Vaping Use: Never used  Substance and Sexual Activity   Alcohol use: No   Drug use: No   Sexual activity: Yes  Other Topics Concern   Not on file  Social History Narrative   Married 34 yrs   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 Neck:  Supple; no masses or thyromegaly. Lungs:  Clear throughout to auscultation, normal respiratory effort.    Heart:  +S1, +S2, Regular rate and rhythm, No edema. Abdomen:  Soft, nontender and nondistended. Normal bowel sounds, without guarding, and without rebound.   Neurologic:  Alert and  oriented x4;  grossly normal neurologically.  Impression/Plan: Mark [name] is here for an endoscopy  to be performed for  evaluation of dysphagia     Risks, benefits, limitations, and alternatives regarding endoscopy have been reviewed with the patient.  Questions have been answered.  All parties agreeable.   Jonathon Bellows, MD  05/09/2021, 11:10 AM

## 2021-05-09 NOTE — Anesthesia Postprocedure Evaluation (Signed)
Anesthesia Post Note  Patient: TAVEN STRITE  Procedure(s) Performed: ESOPHAGOGASTRODUODENOSCOPY (EGD) WITH PROPOFOL  Patient location during evaluation: PACU Anesthesia Type: General Level of consciousness: awake and alert Pain management: pain level controlled Vital Signs Assessment: post-procedure vital signs reviewed and stable Respiratory status: spontaneous breathing, nonlabored ventilation, respiratory function stable and patient connected to nasal cannula oxygen Cardiovascular status: blood pressure returned to baseline and stable Postop Assessment: no apparent nausea or vomiting Anesthetic complications: no   No notable events documented.   Last Vitals:  Vitals:   05/09/21 1143 05/09/21 1153  BP: 113/75 121/86  Pulse: 74 69  Resp: 13 15  Temp:    SpO2: 99% 100%    Last Pain:  Vitals:   05/09/21 1153  TempSrc:   PainSc: 0-No pain                 [place]

## 2021-05-09 NOTE — Op Note (Signed)
[facility] Gastroenterology Patient Name: Mark [name] Procedure Date: 05/09/2021 11:16 AM MRN: [id number] Account #: [id number] Date of Birth: 09-13-1955 Admit Type: Outpatient Age: 65 Room: [facility] ENDO ROOM 3 Gender: Male Note Status: Finalized Instrument Name: Upper Endoscope [id number] Procedure:             Upper GI endoscopy Indications:           Dysphagia Providers:             Jonathon Bellows MD, MD Referring MD:          No Local Md, MD (Referring MD) Medicines:             Monitored Anesthesia Care Complications:         No immediate complications. Procedure:             Pre-Anesthesia Assessment:                        - Prior to the procedure, a History and Physical was                         performed, and patient medications, allergies and                         sensitivities were reviewed. The patient's tolerance                         of previous anesthesia was reviewed.                        - The risks and benefits of the procedure and the                         sedation options and risks were discussed with the                         patient. All questions were answered and informed                         consent was obtained.                        - ASA Grade Assessment: III - A patient with severe                         systemic disease.                        After obtaining informed consent, the endoscope was                         passed under direct vision. Throughout the procedure,                         the patient's blood pressure, pulse, and oxygen                         saturations were monitored continuously. The Endoscope                         was introduced  through the mouth, and advanced to the                         third part of duodenum. The upper GI endoscopy was                         accomplished without difficulty. The patient tolerated                         the procedure well. Findings:      The examined  duodenum was normal.      The stomach was normal.      The cardia and gastric fundus were normal on retroflexion.      One benign-appearing, intrinsic moderate (circumferential scarring or       stenosis; an endoscope may pass) stenosis was found 25 cm from the       incisors. This stenosis measured 1.4 cm (inner diameter) x 1 cm (in       length). The stenosis was traversed. A TTS dilator was passed through       the scope. Dilation with a 12-13.5-15 mm balloon dilator was performed       to 15 mm. The dilation site was examined and showed complete resolution       of luminal narrowing.      Abnormal motility was noted in the esophagus. The cricopharyngeus was       abnormal. There is a decrease in motility of the esophageal body. Normal       peristalsis not noted.      The cardia and gastric fundus were normal on retroflexion. Impression:            - Normal examined duodenum.                        - Normal stomach.                        - Benign-appearing esophageal stenosis. Dilated.                        - Abnormal esophageal motility, suspicious for                         aperistalsis.                        - No specimens collected. Recommendation:        - Discharge patient to home (with escort).                        - Resume previous diet.                        - Continue present medications.                        - Return to my office PRN. 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 All rights reserved. The codes  documented in this report are preliminary and upon coder review may  be revised to meet current compliance requirements. Jonathon Bellows, MD Jonathon Bellows MD, MD 05/09/2021 11:32:52 AM This report has been signed electronically. Number of Addenda: 0 Note Initiated On: 05/09/2021 11:16 AM Estimated Blood Loss:  Estimated blood loss: none.      [facility]

## 2021-05-09 NOTE — Anesthesia Preprocedure Evaluation (Addendum)
Anesthesia Evaluation  Patient identified by MRN, date of birth, ID band Patient awake    Reviewed: Allergy & Precautions, NPO status , Patient's Chart, lab work & pertinent test results  History of Anesthesia Complications Negative for: history of anesthetic complications  Airway Mallampati: II  TM Distance: >3 FB Neck ROM: Full    Dental  (+) Upper Dentures, Lower Dentures   Pulmonary COPD, Current Smoker and Patient abstained from smoking.,  Hx of aspiration pneumonia  Wears 3 L [place] at night; occasional use during the day  Inhalers  Hoarse voice   Pulmonary exam normal        Cardiovascular negative cardio ROS Normal cardiovascular exam     Neuro/Psych Depression  Neuromuscular disease (PN) negative psych ROS   GI/Hepatic Neg liver ROS, Prior PEG tube   Endo/Other  negative endocrine ROS  Renal/GU negative Renal ROS  negative genitourinary   Musculoskeletal  (+) Arthritis , Low back pain   Abdominal   Peds negative pediatric ROS (+)  Hematology Tonsillar cancer s/p resection, chemo, XRT 2009   Anesthesia Other Findings   Reproductive/Obstetrics negative OB ROS                            Anesthesia Physical Anesthesia Plan  ASA: 4  Anesthesia Plan: General   Post-op Pain Management:    Induction:   PONV Risk Score and Plan:   Airway Management Planned: Natural Airway  Additional Equipment:   Intra-op Plan:   Post-operative Plan:   Informed Consent: I have reviewed the patients History and Physical, chart, labs and discussed the procedure including the risks, benefits and alternatives for the proposed anesthesia with the patient or authorized representative who has indicated his/her understanding and acceptance.       Plan Discussed with: CRNA  Anesthesia Plan Comments:         Anesthesia Quick Evaluation

## 2021-05-10 ENCOUNTER — Encounter: Payer: Self-pay | Admitting: Gastroenterology

## 2021-06-03 ENCOUNTER — Ambulatory Visit: Payer: Medicaid Other | Admitting: Gastroenterology

## 2021-06-05 ENCOUNTER — Ambulatory Visit: Payer: Medicaid Other | Admitting: Gastroenterology

## 2021-07-08 ENCOUNTER — Ambulatory Visit: Payer: Medicaid Other | Admitting: Gastroenterology

## 2021-08-13 ENCOUNTER — Other Ambulatory Visit: Payer: Self-pay

## 2021-08-13 ENCOUNTER — Encounter: Payer: Self-pay | Admitting: Gastroenterology

## 2021-08-13 ENCOUNTER — Ambulatory Visit (INDEPENDENT_AMBULATORY_CARE_PROVIDER_SITE_OTHER): Payer: Medicare Other | Admitting: Gastroenterology

## 2021-08-13 VITALS — BP 130/79 | HR 79 | Ht 72.0 in | Wt 119.4 lb

## 2021-08-13 DIAGNOSIS — R4702 Dysphasia: Secondary | ICD-10-CM

## 2021-08-13 DIAGNOSIS — R1319 Other dysphagia: Secondary | ICD-10-CM | POA: Diagnosis not present

## 2021-08-13 NOTE — Progress Notes (Deleted)
Primary Care Physician: Secundino Ginger, PA-C  Primary Gastroenterologist:  Dr. [name]  Chief Complaint  Patient presents with   Diarrhea    HPI: Mark [name] is a 64 y.o. male here with a history of dysphagia and has seen me multiple times for upper endoscopies with dilation.  The patient was most recently seen by Dr. Vicente Males with an upper endoscopy.      Past Medical History:  Diagnosis Date   Arthritis    left hand   Aspiration pneumonia ([place])    Cancer ([place])    tonsilal ca   COPD (chronic obstructive pulmonary disease) ([place])    Incontinence    night time, began recently   Memory deficit 11/18/2014   PEG tube 11/18/2014   removed summer 2017   Peripheral neuropathy 11/18/2014   Smokers' cough ([place])    Tonsillar cancer, S/P surgery, chemotherapy XRT 2009-followed at [facility] 11/18/2014   Wears dentures    full upper and lower    Current Outpatient Medications  Medication Sig Dispense Refill   albuterol (PROVENTIL HFA;VENTOLIN HFA) 108 (90 BASE) MCG/ACT inhaler Inhale 2 puffs into the lungs every 6 (six) hours as needed for wheezing or shortness of breath.      ARIPiprazole (ABILIFY) 2 MG tablet Take 2 mg by mouth daily.      BREZTRI AEROSPHERE 160-9-4.8 MCG/ACT AERO INHALE 2 INHALATIONS INTO THE LUNGS 2 TIMES DAILY     buPROPion (WELLBUTRIN SR) 150 MG 12 hr tablet 150 mg.     cholecalciferol (VITAMIN D3) 25 MCG (1000 UNIT) tablet Take 1,000 Units by mouth daily.     clonazePAM (KLONOPIN) 1 MG tablet Take 1 mg by mouth 2 (two) times daily.     DALIRESP 250 MCG TABS Take 1 tablet by mouth daily.     midodrine (PROAMATINE) 5 MG tablet Take 5 mg by mouth 3 (three) times daily.     Multiple Vitamin (MULTIVITAMIN WITH MINERALS) TABS tablet Take 1 tablet by mouth daily. 30 tablet 0   naloxone (NARCAN) nasal spray 4 mg/0.1 mL SPRAY 1 SPRAY INTO ONE NOSTRIL AS DIRECTED FOR OPIOID OVERDOSE (TURN PERSON ON SIDE AFTER DOSE. IF NO RESPONSE IN 2-3 MINUTES OR PERSON RESPONDS BUT  RELAPSES, REPEAT USING A NEW SPRAY DEVICE AND SPRAY INTO THE OTHER NOSTRIL. CALL 911 AFTER USE.) * EMERGENCY USE ONLY *     nicotine (NICODERM CQ - DOSED IN MG/24 HOURS) 14 mg/24hr patch 14 mg daily.     NONFORMULARY OR COMPOUNDED ITEM See pharmacy note 120 each 2   Nutritional Supplements (FEEDING SUPPLEMENT, NEPRO CARB STEADY,) LIQD Take 237 mLs by mouth 3 (three) times daily between meals. 90 Can 0   omeprazole (PRILOSEC) 40 MG capsule Take 40 mg by mouth daily.     Oxycodone HCl 20 MG TABS Take 20 mg by mouth 4 (four) times daily.     pantoprazole (PROTONIX) 40 MG tablet Take 40 mg by mouth at bedtime.      pravastatin (PRAVACHOL) 20 MG tablet SMARTSIG:1 Tablet(s) By Mouth Every Evening     pregabalin (LYRICA) 150 MG capsule Take 1 capsule by mouth 2 (two) times daily.  0   tamsulosin (FLOMAX) 0.4 MG CAPS capsule Take 0.4 mg by mouth at bedtime.     traZODone (DESYREL) 150 MG tablet Take 150 mg by mouth at bedtime.      venlafaxine (EFFEXOR) 37.5 MG tablet Take 37.5 mg by mouth 2 (two) times daily with a meal.  vitamin C (VITAMIN C) 250 MG tablet Take 1 tablet (250 mg total) by mouth 2 (two) times daily. 60 tablet 0   fluticasone (FLONASE) 50 MCG/ACT nasal spray SPRAY 2 SPRAYS INTO EACH NOSTRIL EVERY DAY (Patient not taking: Reported on 05/09/2021)     mometasone (NASONEX) 50 MCG/ACT nasal spray Place 2 sprays into both nostrils 2 (two) times daily. (Patient not taking: Reported on 05/09/2021)     STIOLTO RESPIMAT 2.5-2.5 MCG/ACT AERS INHALE 2 INHALATIONS INTO THE LUNGS ONCE DAILY (Patient not taking: Reported on 05/09/2021)     No current facility-administered medications for this visit.    Allergies as of 08/13/2021 - Review Complete 08/13/2021  Allergen Reaction Noted   Morphine and related Nausea And Vomiting 11/18/2014    ROS:  General: Negative for anorexia, weight loss, fever, chills, fatigue, weakness. ENT: Negative for hoarseness, difficulty swallowing , nasal  congestion. CV: Negative for chest pain, angina, palpitations, dyspnea on exertion, peripheral edema.  Respiratory: Negative for dyspnea at rest, dyspnea on exertion, cough, sputum, wheezing.  GI: See history of present illness. GU:  Negative for dysuria, hematuria, urinary incontinence, urinary frequency, nocturnal urination.  Endo: Negative for unusual weight change.    Physical Examination:   BP 130/79 (BP Location: Left Arm, Patient Position: Sitting, Cuff Size: Normal)    Pulse 79    Ht 6' (1.829 m)    Wt 119 lb 6.4 oz (54.2 kg)    BMI 16.19 kg/m   General: Well-nourished, well-developed in no acute distress.  Eyes: No icterus. Conjunctivae pink. Lungs: Clear to auscultation bilaterally. Non-labored. Heart: Regular rate and rhythm, no murmurs rubs or gallops.  Abdomen: Bowel sounds are normal, nontender, nondistended, no hepatosplenomegaly or masses, no abdominal bruits or hernia , no rebound or guarding.   Extremities: No lower extremity edema. No clubbing or deformities. Neuro: Alert and oriented x 3.  Grossly intact. Skin: Warm and dry, no jaundice.   Psych: Alert and cooperative, normal mood and affect.  Labs:    Imaging Studies: No results found.  Assessment and Plan:   Mark [name] is a 65 y.o. y/o male ***     [name], MD. [facility] Regal    Note: This dictation was prepared with Dragon dictation along with smaller phrase technology. Any transcriptional errors that result from this process are unintentional.

## 2021-08-13 NOTE — Progress Notes (Signed)
Primary Care Physician: Secundino Ginger, PA-C  Primary Gastroenterologist:  Dr. [name]  Chief Complaint  Patient presents with   Dysphagia    HPI: Mark [name] is a 65 y.o. male here here with a history of dysphagia and has seen me multiple times for upper endoscopies with dilation.  The patient was most recently seen by Dr. Vicente Males with an upper endoscopy.  The patient reports that he is gaining weight and not losing weight but continues to have a feeling of fluid sticking in his throat.  The patient has been drinking Gatorade.  Past Medical History:  Diagnosis Date   Arthritis    left hand   Aspiration pneumonia ([place])    Cancer ([place])    tonsilal ca   COPD (chronic obstructive pulmonary disease) ([place])    Incontinence    night time, began recently   Memory deficit 11/18/2014   PEG tube 11/18/2014   removed summer 2017   Peripheral neuropathy 11/18/2014   Smokers' cough ([place])    Tonsillar cancer, S/P surgery, chemotherapy XRT 2009-followed at [facility] 11/18/2014   Wears dentures    full upper and lower    Current Outpatient Medications  Medication Sig Dispense Refill   albuterol (PROVENTIL HFA;VENTOLIN HFA) 108 (90 BASE) MCG/ACT inhaler Inhale 2 puffs into the lungs every 6 (six) hours as needed for wheezing or shortness of breath.      ARIPiprazole (ABILIFY) 2 MG tablet Take 2 mg by mouth daily.      BREZTRI AEROSPHERE 160-9-4.8 MCG/ACT AERO INHALE 2 INHALATIONS INTO THE LUNGS 2 TIMES DAILY     buPROPion (WELLBUTRIN SR) 150 MG 12 hr tablet 150 mg.     cholecalciferol (VITAMIN D3) 25 MCG (1000 UNIT) tablet Take 1,000 Units by mouth daily.     clonazePAM (KLONOPIN) 1 MG tablet Take 1 mg by mouth 2 (two) times daily.     DALIRESP 250 MCG TABS Take 1 tablet by mouth daily.     midodrine (PROAMATINE) 5 MG tablet Take 5 mg by mouth 3 (three) times daily.     Multiple Vitamin (MULTIVITAMIN WITH MINERALS) TABS tablet Take 1 tablet by mouth daily. 30 tablet 0   naloxone (NARCAN)  nasal spray 4 mg/0.1 mL SPRAY 1 SPRAY INTO ONE NOSTRIL AS DIRECTED FOR OPIOID OVERDOSE (TURN PERSON ON SIDE AFTER DOSE. IF NO RESPONSE IN 2-3 MINUTES OR PERSON RESPONDS BUT RELAPSES, REPEAT USING A NEW SPRAY DEVICE AND SPRAY INTO THE OTHER NOSTRIL. CALL 911 AFTER USE.) * EMERGENCY USE ONLY *     nicotine (NICODERM CQ - DOSED IN MG/24 HOURS) 14 mg/24hr patch 14 mg daily.     NONFORMULARY OR COMPOUNDED ITEM See pharmacy note 120 each 2   Nutritional Supplements (FEEDING SUPPLEMENT, NEPRO CARB STEADY,) LIQD Take 237 mLs by mouth 3 (three) times daily between meals. 90 Can 0   omeprazole (PRILOSEC) 40 MG capsule Take 40 mg by mouth daily.     Oxycodone HCl 20 MG TABS Take 20 mg by mouth 4 (four) times daily.     pantoprazole (PROTONIX) 40 MG tablet Take 40 mg by mouth at bedtime.      pravastatin (PRAVACHOL) 20 MG tablet SMARTSIG:1 Tablet(s) By Mouth Every Evening     pregabalin (LYRICA) 150 MG capsule Take 1 capsule by mouth 2 (two) times daily.  0   tamsulosin (FLOMAX) 0.4 MG CAPS capsule Take 0.4 mg by mouth at bedtime.     traZODone (DESYREL) 150 MG tablet Take  150 mg by mouth at bedtime.      venlafaxine (EFFEXOR) 37.5 MG tablet Take 37.5 mg by mouth 2 (two) times daily with a meal.      vitamin C (VITAMIN C) 250 MG tablet Take 1 tablet (250 mg total) by mouth 2 (two) times daily. 60 tablet 0   fluticasone (FLONASE) 50 MCG/ACT nasal spray SPRAY 2 SPRAYS INTO EACH NOSTRIL EVERY DAY (Patient not taking: Reported on 05/09/2021)     mometasone (NASONEX) 50 MCG/ACT nasal spray Place 2 sprays into both nostrils 2 (two) times daily. (Patient not taking: Reported on 05/09/2021)     STIOLTO RESPIMAT 2.5-2.5 MCG/ACT AERS INHALE 2 INHALATIONS INTO THE LUNGS ONCE DAILY (Patient not taking: Reported on 05/09/2021)     No current facility-administered medications for this visit.    Allergies as of 08/13/2021 - Review Complete 08/13/2021  Allergen Reaction Noted   Morphine and related Nausea And Vomiting  11/18/2014    ROS:  General: Negative for anorexia, weight loss, fever, chills, fatigue, weakness. ENT: Negative for hoarseness, difficulty swallowing , nasal congestion. CV: Negative for chest pain, angina, palpitations, dyspnea on exertion, peripheral edema.  Respiratory: Negative for dyspnea at rest, dyspnea on exertion, cough, sputum, wheezing.  GI: See history of present illness. GU:  Negative for dysuria, hematuria, urinary incontinence, urinary frequency, nocturnal urination.  Endo: Negative for unusual weight change.    Physical Examination:   BP 130/79 (BP Location: Left Arm, Patient Position: Sitting, Cuff Size: Normal)    Pulse 79    Ht 6' (1.829 m)    Wt 119 lb 6.4 oz (54.2 kg)    BMI 16.19 kg/m   General: Well-nourished, well-developed in no acute distress.  Eyes: No icterus. Conjunctivae pink. Skin: Warm and dry, no jaundice.   Psych: Alert and cooperative, normal mood and affect.  Labs:    Imaging Studies: No results found.  Assessment and Plan:   Mark [name] is a 65 y.o. y/o male with a history of recurrent EGDs for dysphagia.  The patient reports that his dysphagia has been getting worse over the last month.  The patient will be set up for an EGD due to his recurrent strictures and history of dysphagia.  The patient has been explained the plan agrees with it.     [name], MD. [facility] Regal    Note: This dictation was prepared with Dragon dictation along with smaller phrase technology. Any transcriptional errors that result from this process are unintentional.

## 2021-08-15 ENCOUNTER — Ambulatory Visit: Payer: Medicare Other | Admitting: Anesthesiology

## 2021-08-15 ENCOUNTER — Encounter
Admission: RE | Disposition: A | Discharge status: Home / Self Care | Payer: Self-pay | Source: Home / Self Care | Attending: Gastroenterology

## 2021-08-15 ENCOUNTER — Other Ambulatory Visit: Payer: Self-pay

## 2021-08-15 ENCOUNTER — Encounter: Payer: Self-pay | Admitting: Gastroenterology

## 2021-08-15 ENCOUNTER — Ambulatory Visit
Admission: RE | Admit: 2021-08-15 | Discharge: 2021-08-15 | Disposition: A | Discharge status: Home / Self Care | Payer: Medicare Other | Attending: Gastroenterology | Admitting: Gastroenterology

## 2021-08-15 DIAGNOSIS — R1319 Other dysphagia: Secondary | ICD-10-CM

## 2021-08-15 DIAGNOSIS — I959 Hypotension, unspecified: Secondary | ICD-10-CM | POA: Insufficient documentation

## 2021-08-15 DIAGNOSIS — Z79899 Other long term (current) drug therapy: Secondary | ICD-10-CM | POA: Insufficient documentation

## 2021-08-15 DIAGNOSIS — J449 Chronic obstructive pulmonary disease, unspecified: Secondary | ICD-10-CM | POA: Insufficient documentation

## 2021-08-15 DIAGNOSIS — Z9981 Dependence on supplemental oxygen: Secondary | ICD-10-CM | POA: Diagnosis not present

## 2021-08-15 DIAGNOSIS — R1310 Dysphagia, unspecified: Secondary | ICD-10-CM | POA: Diagnosis not present

## 2021-08-15 DIAGNOSIS — F17210 Nicotine dependence, cigarettes, uncomplicated: Secondary | ICD-10-CM | POA: Insufficient documentation

## 2021-08-15 DIAGNOSIS — K222 Esophageal obstruction: Secondary | ICD-10-CM

## 2021-08-15 DIAGNOSIS — R4702 Dysphasia: Secondary | ICD-10-CM

## 2021-08-15 HISTORY — PX: ESOPHAGOGASTRODUODENOSCOPY (EGD) WITH PROPOFOL: SHX5813

## 2021-08-15 HISTORY — PX: ESOPHAGEAL DILATION: SHX303

## 2021-08-15 SURGERY — ESOPHAGOGASTRODUODENOSCOPY (EGD) WITH PROPOFOL
Anesthesia: General | Site: Throat

## 2021-08-15 MED ORDER — LIDOCAINE HCL (CARDIAC) PF 100 MG/5ML IV SOSY
PREFILLED_SYRINGE | INTRAVENOUS | Status: DC | PRN
  Administered 2021-08-15: 30 mg via INTRAVENOUS

## 2021-08-15 MED ORDER — STERILE WATER FOR IRRIGATION IR SOLN
Status: DC | PRN
  Administered 2021-08-15: 250 mL

## 2021-08-15 MED ORDER — GLYCOPYRROLATE 0.2 MG/ML IJ SOLN
INTRAMUSCULAR | Status: DC | PRN
Start: 2021-08-15 — End: 2021-08-15
  Administered 2021-08-15: .1 mg via INTRAVENOUS

## 2021-08-15 MED ORDER — SODIUM CHLORIDE 0.9 % IV SOLN: INTRAVENOUS | Status: DC

## 2021-08-15 MED ORDER — LACTATED RINGERS IV SOLN: INTRAVENOUS | Status: DC

## 2021-08-15 MED ORDER — PROPOFOL 10 MG/ML IV BOLUS
INTRAVENOUS | Status: DC | PRN
  Administered 2021-08-15: 120 mg via INTRAVENOUS
  Administered 2021-08-15: 30 mg via INTRAVENOUS
  Administered 2021-08-15: 20 mg via INTRAVENOUS
  Administered 2021-08-15: 30 mg via INTRAVENOUS

## 2021-08-15 SURGICAL SUPPLY — 10 items
BALLN DILATOR 15-18 8 (BALLOONS) ×4
BALLOON DILATOR 15-18 8 (BALLOONS) IMPLANT
BLOCK BITE 60FR ADLT L/F GRN (MISCELLANEOUS) ×4 IMPLANT
GOWN CVR UNV OPN BCK APRN NK (MISCELLANEOUS) ×4 IMPLANT
GOWN ISOL THUMB LOOP REG UNIV (MISCELLANEOUS) ×8
KIT PRC NS LF DISP ENDO (KITS) ×2 IMPLANT
KIT PROCEDURE OLYMPUS (KITS) ×4
MANIFOLD NEPTUNE II (INSTRUMENTS) ×4 IMPLANT
SYR INFLATION 60ML (SYRINGE) ×2 IMPLANT
WATER STERILE IRR 250ML POUR (IV SOLUTION) ×4 IMPLANT

## 2021-08-15 NOTE — Anesthesia Preprocedure Evaluation (Signed)
Anesthesia Evaluation  Patient identified by MRN, date of birth, ID band Patient awake    History of Anesthesia Complications Negative for: history of anesthetic complications  Airway Mallampati: II  TM Distance: >3 FB Neck ROM: Full    Dental  (+) Edentulous Upper, Edentulous Lower   Pulmonary COPD,  oxygen dependent, Current Smoker and Patient abstained from smoking.,    Pulmonary exam normal        Cardiovascular Exercise Tolerance: Good Normal cardiovascular exam  On midodrine for low BP   Neuro/Psych    GI/Hepatic Neg liver ROS, Dysphagia, multiple EGDs in the past   Endo/Other  negative endocrine ROS  Renal/GU negative Renal ROS     Musculoskeletal   Abdominal   Peds  Hematology negative hematology ROS (+)   Anesthesia Other Findings   Reproductive/Obstetrics                            Anesthesia Physical Anesthesia Plan  ASA: 3  Anesthesia Plan: General   Post-op Pain Management: Minimal or no pain anticipated   Induction: Intravenous  PONV Risk Score and Plan: 1 and TIVA, Propofol infusion and Treatment may vary due to age or medical condition  Airway Management Planned: Nasal Cannula and Natural Airway  Additional Equipment: None  Intra-op Plan:   Post-operative Plan: Extubation in OR  Informed Consent: I have reviewed the patients History and Physical, chart, labs and discussed the procedure including the risks, benefits and alternatives for the proposed anesthesia with the patient or authorized representative who has indicated his/her understanding and acceptance.       Plan Discussed with: CRNA  Anesthesia Plan Comments:         Anesthesia Quick Evaluation

## 2021-08-15 NOTE — Anesthesia Procedure Notes (Signed)
Date/Time: 08/15/2021 9:27 AM Performed by: Cameron Ali, CRNA Pre-anesthesia Checklist: Patient identified, Emergency Drugs available, Suction available, Timeout performed and Patient being monitored Patient Re-evaluated:Patient Re-evaluated prior to induction Oxygen Delivery Method: Nasal cannula Placement Confirmation: positive ETCO2

## 2021-08-15 NOTE — Anesthesia Postprocedure Evaluation (Signed)
Anesthesia Post Note  Patient: Mark [name]  Procedure(s) Performed: ESOPHAGOGASTRODUODENOSCOPY (EGD) WITH PROPOFOL (Throat) ESOPHAGEAL DILATION (Throat)     Patient location during evaluation: PACU Anesthesia Type: General Level of consciousness: awake and alert Pain management: pain level controlled Vital Signs Assessment: post-procedure vital signs reviewed and stable Respiratory status: spontaneous breathing, nonlabored ventilation, respiratory function stable and patient connected to nasal cannula oxygen Cardiovascular status: blood pressure returned to baseline and stable Postop Assessment: no apparent nausea or vomiting Anesthetic complications: no   No notable events documented.  Adele Barthel Jasime Westergren

## 2021-08-15 NOTE — Transfer of Care (Signed)
Immediate Anesthesia Transfer of Care Note  Patient: Mark [name]  Procedure(s) Performed: ESOPHAGOGASTRODUODENOSCOPY (EGD) WITH PROPOFOL (Throat) ESOPHAGEAL DILATION (Throat)  Patient Location: PACU  Anesthesia Type: General  Level of Consciousness: awake, alert  and patient cooperative  Airway and Oxygen Therapy: Patient Spontanous Breathing and Patient connected to supplemental oxygen  Post-op Assessment: Post-op Vital signs reviewed, Patient's Cardiovascular Status Stable, Respiratory Function Stable, Patent Airway and No signs of Nausea or vomiting  Post-op Vital Signs: Reviewed and stable  Complications: No notable events documented.

## 2021-08-15 NOTE — H&P (Signed)
[name], MD [address] [place], [place] [address] Phone:[phone] Fax : [phone]  Primary Care Physician:  Secundino Ginger, PA-C Primary Gastroenterologist:  Dr. Allen Norris  Pre-Procedure History & Physical: HPI:  Mark [name] is a 65 y.o. male is here for an endoscopy.   Past Medical History:  Diagnosis Date   Arthritis    left hand   Aspiration pneumonia ([place])    Cancer ([place])    tonsilal ca   COPD (chronic obstructive pulmonary disease) ([place])    Incontinence    night time, began recently   Memory deficit 11/18/2014   PEG tube 11/18/2014   removed summer 2017   Peripheral neuropathy 11/18/2014   Smokers' cough ([place])    Tonsillar cancer, S/P surgery, chemotherapy XRT 2009-followed at [facility] 11/18/2014   Wears dentures    full upper and lower    Past Surgical History:  Procedure Laterality Date   APPENDECTOMY     CHOLECYSTECTOMY  2007   ESOPHAGEAL DILATION N/A 08/01/2016   Procedure: ESOPHAGEAL DILATION;  Surgeon: [name], MD;  Location: [place];  Service: Endoscopy;  Laterality: N/A;   ESOPHAGEAL DILATION N/A 02/19/2017   Procedure: ESOPHAGEAL DILATION;  Surgeon: [name], MD;  Location: [place];  Service: Endoscopy;  Laterality: N/A;   ESOPHAGEAL DILATION N/A 08/27/2017   Procedure: ESOPHAGEAL DILATION;  Surgeon: [name], MD;  Location: [place];  Service: Endoscopy;  Laterality: N/A;   ESOPHAGEAL DILATION  04/26/2020   Procedure: ESOPHAGEAL DILATION;  Surgeon: [name], MD;  Location: [place];  Service: Endoscopy;;   ESOPHAGOGASTRODUODENOSCOPY (EGD) WITH PROPOFOL N/A 01/24/2016   Procedure: ESOPHAGOGASTRODUODENOSCOPY (EGD) WITH gastric biopsy and esophageal dilation.;  Surgeon: [name], MD;  Location: [place];  Service: Endoscopy;  Laterality: N/A;   ESOPHAGOGASTRODUODENOSCOPY (EGD) WITH PROPOFOL N/A 08/01/2016   Procedure: ESOPHAGOGASTRODUODENOSCOPY (EGD) WITH PROPOFOL;   Surgeon: [name], MD;  Location: [place];  Service: Endoscopy;  Laterality: N/A;   ESOPHAGOGASTRODUODENOSCOPY (EGD) WITH PROPOFOL N/A 02/19/2017   Procedure: ESOPHAGOGASTRODUODENOSCOPY (EGD) WITH PROPOFOL;  Surgeon: [name], MD;  Location: [place];  Service: Endoscopy;  Laterality: N/A;   ESOPHAGOGASTRODUODENOSCOPY (EGD) WITH PROPOFOL N/A 08/27/2017   Procedure: ESOPHAGOGASTRODUODENOSCOPY (EGD) WITH PROPOFOL;  Surgeon: [name], MD;  Location: [place];  Service: Endoscopy;  Laterality: N/A;   ESOPHAGOGASTRODUODENOSCOPY (EGD) WITH PROPOFOL N/A 03/26/2018   Procedure: ESOPHAGOGASTRODUODENOSCOPY (EGD) WITH PROPOFOL;  Surgeon: Jonathon Bellows, MD;  Location: [facility] ENDOSCOPY;  Service: Gastroenterology;  Laterality: N/A;   ESOPHAGOGASTRODUODENOSCOPY (EGD) WITH PROPOFOL N/A 04/22/2018   Procedure: ESOPHAGOGASTRODUODENOSCOPY (EGD) WITH PROPOFOL;  Surgeon: [name], MD;  Location: [place];  Service: Endoscopy;  Laterality: N/A;   ESOPHAGOGASTRODUODENOSCOPY (EGD) WITH PROPOFOL N/A 04/26/2020   Procedure: ESOPHAGOGASTRODUODENOSCOPY (EGD) WITH PROPOFOL;  Surgeon: [name], MD;  Location: [place];  Service: Endoscopy;  Laterality: N/A;   ESOPHAGOGASTRODUODENOSCOPY (EGD) WITH PROPOFOL N/A 05/09/2021   Procedure: ESOPHAGOGASTRODUODENOSCOPY (EGD) WITH PROPOFOL;  Surgeon: Jonathon Bellows, MD;  Location: [facility] ENDOSCOPY;  Service: Gastroenterology;  Laterality: N/A;   ESOPHAGOSCOPY WITH DILITATION N/A 04/22/2018   Procedure: ESOPHAGOSCOPY WITH DILITATION;  Surgeon: [name], MD;  Location: [place];  Service: Endoscopy;  Laterality: N/A;   PEG PLACEMENT N/A 11/28/2015   Procedure: PERCUTANEOUS ENDOSCOPIC GASTROSTOMY (PEG) PLACEMENT;  Surgeon: [name], MD;  Location: [place];  Service: Endoscopy;  Laterality: N/A;   PEG TUBE PLACEMENT  10/22/12   10/22/12 - [facility], 10/23/14 - [facility],  Dr. Allen Norris, replaced   TONSILLECTOMY     jan  2010--stage IV    Prior to Admission medications   Medication Sig Start Date End Date Taking? Authorizing Provider  albuterol (PROVENTIL HFA;VENTOLIN HFA) 108 (90 BASE) MCG/ACT inhaler Inhale 2 puffs into the lungs every 6 (six) hours as needed for wheezing or shortness of breath.    Yes [provider]  ARIPiprazole (ABILIFY) 2 MG tablet Take 2 mg by mouth daily.    Yes [provider]  BREZTRI AEROSPHERE 160-9-4.8 MCG/ACT AERO INHALE 2 INHALATIONS INTO THE LUNGS 2 TIMES DAILY 10/04/20  Yes [provider]  buPROPion (WELLBUTRIN SR) 150 MG 12 hr tablet 150 mg.   Yes [provider]  cholecalciferol (VITAMIN D3) 25 MCG (1000 UNIT) tablet Take 1,000 Units by mouth daily.   Yes [provider]  clonazePAM (KLONOPIN) 1 MG tablet Take 1 mg by mouth 2 (two) times daily.   Yes [provider]  DALIRESP 250 MCG TABS Take 1 tablet by mouth daily. 09/25/20  Yes [provider]  midodrine (PROAMATINE) 5 MG tablet Take 5 mg by mouth 3 (three) times daily. 09/25/20  Yes [provider]  naloxone (NARCAN) nasal spray 4 mg/0.1 mL SPRAY 1 SPRAY INTO ONE NOSTRIL AS DIRECTED FOR OPIOID OVERDOSE (TURN PERSON ON SIDE AFTER DOSE. IF NO RESPONSE IN 2-3 MINUTES OR PERSON RESPONDS BUT RELAPSES, REPEAT USING A NEW SPRAY DEVICE AND SPRAY INTO THE OTHER NOSTRIL. CALL 911 AFTER USE.) * EMERGENCY USE ONLY * 10/01/20  Yes [provider]  nicotine (NICODERM CQ - DOSED IN MG/24 HOURS) 14 mg/24hr patch 14 mg daily. 10/19/20  Yes [provider]  Nutritional Supplements (FEEDING SUPPLEMENT, NEPRO CARB STEADY,) LIQD Take 237 mLs by mouth 3 (three) times daily between meals. 05/05/18  Yes Wieting, Richard, MD  omeprazole (PRILOSEC) 40 MG capsule Take 40 mg by mouth daily. 08/03/21  Yes [provider]  Oxycodone HCl 20 MG TABS Take 20 mg by mouth 4 (four) times daily.   Yes [provider]  pantoprazole (PROTONIX) 40 MG tablet Take 40  mg by mouth at bedtime.    Yes [provider]  pravastatin (PRAVACHOL) 20 MG tablet SMARTSIG:1 Tablet(s) By Mouth Every Evening 10/10/20  Yes [provider]  pregabalin (LYRICA) 150 MG capsule Take 1 capsule by mouth 2 (two) times daily. 04/13/18  Yes [provider]  tamsulosin (FLOMAX) 0.4 MG CAPS capsule Take 0.4 mg by mouth at bedtime. 08/05/21  Yes [provider]  traZODone (DESYREL) 150 MG tablet Take 150 mg by mouth at bedtime.    Yes [provider]  venlafaxine (EFFEXOR) 37.5 MG tablet Take 37.5 mg by mouth 2 (two) times daily with a meal.    Yes [provider]  vitamin C (VITAMIN C) 250 MG tablet Take 1 tablet (250 mg total) by mouth 2 (two) times daily. 05/05/18  Yes Wieting, Richard, MD  fluticasone (FLONASE) 50 MCG/ACT nasal spray SPRAY 2 SPRAYS INTO EACH NOSTRIL EVERY DAY Patient not taking: Reported on 05/09/2021 09/17/20   [provider]  mometasone (NASONEX) 50 MCG/ACT nasal spray Place 2 sprays into both nostrils 2 (two) times daily. Patient not taking: Reported on 08/13/2021    [provider]  Multiple Vitamin (MULTIVITAMIN WITH MINERALS) TABS tablet Take 1 tablet by mouth daily. Patient not taking: Reported on 08/13/2021 05/05/18   Loletha Grayer, MD  NONFORMULARY OR COMPOUNDED ITEM See pharmacy note 10/25/20   Felipa Furnace,  DPM  STIOLTO RESPIMAT 2.5-2.5 MCG/ACT AERS INHALE 2 INHALATIONS INTO THE LUNGS ONCE DAILY Patient not taking: Reported on 05/09/2021 10/04/20   [provider]    Allergies as of 08/13/2021 - Review Complete 08/13/2021  Allergen Reaction Noted   Morphine and related Nausea And Vomiting 11/18/2014    Family History  Problem Relation Age of Onset   Pancreatic cancer Mother    Stroke Father    Testicular cancer Father    Suicidality Father     Social History   Socioeconomic History   Marital status: Married    Spouse name: Not on file   Number of children: Not on  file   Years of education: Not on file   Highest education level: Not on file  Occupational History   Not on file  Tobacco Use   Smoking status: Every Day    Packs/day: 0.25    Years: 40.00    Pack years: 10.00    Types: Cigarettes   Smokeless tobacco: Never   Tobacco comments:    was 2 PPD, now 7 cigs/day  Vaping Use   Vaping Use: Never used  Substance and Sexual Activity   Alcohol use: No   Drug use: No   Sexual activity: Yes  Other Topics Concern   Not on file  Social History Narrative   Married 34 yrs   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 Neck:  Supple; no masses or thyromegaly. Lungs:  Clear throughout to auscultation.    Heart:  Regular rate and rhythm. Abdomen:  Soft, nontender and nondistended. Normal bowel sounds, without guarding, and without rebound.   Neurologic:  Alert and  oriented x4;  grossly normal neurologically.  Impression/Plan: Mark [name] is here for an endoscopy to be performed for dysphagia  Risks, benefits, limitations, and alternatives regarding  endoscopy have been reviewed with the patient.  Questions have been answered.  All parties agreeable.   [name], MD  08/15/2021, 9:18 AM

## 2021-08-15 NOTE — Op Note (Signed)
[facility] Gastroenterology Patient Name: Mark [name] Procedure Date: 08/15/2021 9:14 AM MRN: [id number] Account #: [id number] Date of Birth: 03/29/1956 Admit Type: Outpatient Age: 65 Room: [facility] OR ROOM 01 Gender: Male Note Status: Finalized Instrument Name: [id number] Procedure:             Upper GI endoscopy Indications:           Dysphagia Providers:             [name] MD, MD Referring MD:          Otelia Limes. Duran PA, PA (Referring MD) Medicines:             Propofol per Anesthesia Complications:         No immediate complications. Procedure:             Pre-Anesthesia Assessment:                        - Prior to the procedure, a History and Physical was                         performed, and patient medications and allergies were                         reviewed. The patient's tolerance of previous                         anesthesia was also reviewed. The risks and benefits                         of the procedure and the sedation options and risks                         were discussed with the patient. All questions were                         answered, and informed consent was obtained. Prior                         Anticoagulants: The patient has taken no previous                         anticoagulant or antiplatelet agents. ASA Grade                         Assessment: II - A patient with mild systemic disease.                         After reviewing the risks and benefits, the patient                         was deemed in satisfactory condition to undergo the                         procedure.                        After obtaining informed consent, the endoscope was  passed under direct vision. Throughout the procedure,                         the patient's blood pressure, pulse, and oxygen                         saturations were monitored continuously. The Endoscope                         was introduced through the  mouth, and advanced to the                         second part of duodenum. The upper GI endoscopy was                         accomplished without difficulty. The patient tolerated                         the procedure well. Findings:      One benign-appearing, intrinsic mild stenosis was found 25 cm from the       incisors. The stenosis was traversed. A TTS dilator was passed through       the scope. Dilation with a 15-16.5-18 mm balloon dilator was performed       to 18 mm. The dilation site was examined following endoscope reinsertion       and showed complete resolution of luminal narrowing.      The stomach was normal.      The examined duodenum was normal. Impression:            - Benign-appearing esophageal stenosis. Dilated.                        - Normal stomach.                        - Normal examined duodenum.                        - No specimens collected. Recommendation:        - Discharge patient to home.                        - Resume previous diet.                        - Continue present medications.                        - Repeat upper endoscopy PRN for retreatment. Procedure Code(s):     --- Professional ---                        [phone], Esophagogastroduodenoscopy, flexible,                         transoral; with transendoscopic balloon dilation of                         esophagus (less than 30 mm diameter) Diagnosis Code(s):     --- Professional ---  R13.10, Dysphagia, unspecified                        K22.2, Esophageal obstruction CPT copyright 2019 American Medical Association. All rights reserved. The codes documented in this report are preliminary and upon coder review may  be revised to meet current compliance requirements. [name] MD, MD 08/15/2021 9:37:58 AM This report has been signed electronically. Number of Addenda: 0 Note Initiated On: 08/15/2021 9:14 AM Total Procedure Duration: 0 hours 6 minutes 43 seconds   Estimated Blood Loss:  Estimated blood loss: none.      [facility]

## 2021-09-19 ENCOUNTER — Inpatient Hospital Stay (HOSPITAL_COMMUNITY): Payer: Medicare Other | Admitting: Certified Registered Nurse Anesthetist

## 2021-09-19 ENCOUNTER — Emergency Department (HOSPITAL_COMMUNITY): Payer: Medicare Other

## 2021-09-19 ENCOUNTER — Other Ambulatory Visit: Payer: Self-pay

## 2021-09-19 ENCOUNTER — Inpatient Hospital Stay (HOSPITAL_COMMUNITY): Payer: Medicare Other

## 2021-09-19 ENCOUNTER — Inpatient Hospital Stay (HOSPITAL_COMMUNITY)
Admission: EM | Admit: 2021-09-19 | Discharge: 2021-10-06 | DRG: 956 | Disposition: A | Discharge status: Rehabilitation Facility | Payer: Medicare Other | Attending: Surgery | Admitting: Surgery

## 2021-09-19 ENCOUNTER — Encounter (HOSPITAL_COMMUNITY): Payer: Self-pay | Admitting: General Surgery

## 2021-09-19 ENCOUNTER — Encounter (HOSPITAL_COMMUNITY): Admission: EM | Disposition: A | Discharge status: Rehabilitation Facility | Payer: Self-pay | Source: Home / Self Care

## 2021-09-19 DIAGNOSIS — S065XAA Traumatic subdural hemorrhage with loss of consciousness status unknown, initial encounter: Secondary | ICD-10-CM | POA: Diagnosis present

## 2021-09-19 DIAGNOSIS — E43 Unspecified severe protein-calorie malnutrition: Secondary | ICD-10-CM | POA: Insufficient documentation

## 2021-09-19 DIAGNOSIS — S72143A Displaced intertrochanteric fracture of unspecified femur, initial encounter for closed fracture: Secondary | ICD-10-CM

## 2021-09-19 DIAGNOSIS — S72141A Displaced intertrochanteric fracture of right femur, initial encounter for closed fracture: Secondary | ICD-10-CM

## 2021-09-19 DIAGNOSIS — J9690 Respiratory failure, unspecified, unspecified whether with hypoxia or hypercapnia: Secondary | ICD-10-CM

## 2021-09-19 DIAGNOSIS — R0902 Hypoxemia: Secondary | ICD-10-CM

## 2021-09-19 DIAGNOSIS — S7290XA Unspecified fracture of unspecified femur, initial encounter for closed fracture: Secondary | ICD-10-CM

## 2021-09-19 DIAGNOSIS — T1490XA Injury, unspecified, initial encounter: Secondary | ICD-10-CM

## 2021-09-19 DIAGNOSIS — Z419 Encounter for procedure for purposes other than remedying health state, unspecified: Secondary | ICD-10-CM

## 2021-09-19 DIAGNOSIS — S06340A Traumatic hemorrhage of right cerebrum without loss of consciousness, initial encounter: Principal | ICD-10-CM

## 2021-09-19 DIAGNOSIS — J189 Pneumonia, unspecified organism: Secondary | ICD-10-CM

## 2021-09-19 DIAGNOSIS — Z4659 Encounter for fitting and adjustment of other gastrointestinal appliance and device: Secondary | ICD-10-CM

## 2021-09-19 DIAGNOSIS — L8990 Pressure ulcer of unspecified site, unspecified stage: Secondary | ICD-10-CM | POA: Insufficient documentation

## 2021-09-19 DIAGNOSIS — S13100A Subluxation of unspecified cervical vertebrae, initial encounter: Secondary | ICD-10-CM | POA: Diagnosis present

## 2021-09-19 DIAGNOSIS — T17908A Unspecified foreign body in respiratory tract, part unspecified causing other injury, initial encounter: Secondary | ICD-10-CM

## 2021-09-19 DIAGNOSIS — R1310 Dysphagia, unspecified: Secondary | ICD-10-CM | POA: Diagnosis present

## 2021-09-19 DIAGNOSIS — Z20822 Contact with and (suspected) exposure to covid-19: Secondary | ICD-10-CM | POA: Diagnosis present

## 2021-09-19 DIAGNOSIS — I959 Hypotension, unspecified: Secondary | ICD-10-CM | POA: Diagnosis not present

## 2021-09-19 DIAGNOSIS — H538 Other visual disturbances: Secondary | ICD-10-CM | POA: Diagnosis not present

## 2021-09-19 DIAGNOSIS — S2243XD Multiple fractures of ribs, bilateral, subsequent encounter for fracture with routine healing: Secondary | ICD-10-CM | POA: Diagnosis not present

## 2021-09-19 DIAGNOSIS — R402362 Coma scale, best motor response, obeys commands, at arrival to emergency department: Secondary | ICD-10-CM | POA: Diagnosis present

## 2021-09-19 DIAGNOSIS — D62 Acute posthemorrhagic anemia: Secondary | ICD-10-CM | POA: Diagnosis not present

## 2021-09-19 DIAGNOSIS — Z681 Body mass index (BMI) 19 or less, adult: Secondary | ICD-10-CM | POA: Diagnosis not present

## 2021-09-19 DIAGNOSIS — E875 Hyperkalemia: Secondary | ICD-10-CM | POA: Diagnosis not present

## 2021-09-19 DIAGNOSIS — R402142 Coma scale, eyes open, spontaneous, at arrival to emergency department: Secondary | ICD-10-CM | POA: Diagnosis present

## 2021-09-19 DIAGNOSIS — S065XAD Traumatic subdural hemorrhage with loss of consciousness status unknown, subsequent encounter: Secondary | ICD-10-CM | POA: Diagnosis not present

## 2021-09-19 DIAGNOSIS — F17210 Nicotine dependence, cigarettes, uncomplicated: Secondary | ICD-10-CM | POA: Diagnosis present

## 2021-09-19 DIAGNOSIS — Z79899 Other long term (current) drug therapy: Secondary | ICD-10-CM | POA: Diagnosis not present

## 2021-09-19 DIAGNOSIS — S12200A Unspecified displaced fracture of third cervical vertebra, initial encounter for closed fracture: Secondary | ICD-10-CM | POA: Diagnosis present

## 2021-09-19 DIAGNOSIS — I10 Essential (primary) hypertension: Secondary | ICD-10-CM | POA: Diagnosis present

## 2021-09-19 DIAGNOSIS — Z885 Allergy status to narcotic agent status: Secondary | ICD-10-CM | POA: Diagnosis not present

## 2021-09-19 DIAGNOSIS — Z981 Arthrodesis status: Secondary | ICD-10-CM | POA: Diagnosis not present

## 2021-09-19 DIAGNOSIS — Z8589 Personal history of malignant neoplasm of other organs and systems: Secondary | ICD-10-CM | POA: Diagnosis not present

## 2021-09-19 DIAGNOSIS — G629 Polyneuropathy, unspecified: Secondary | ICD-10-CM | POA: Diagnosis present

## 2021-09-19 DIAGNOSIS — R402252 Coma scale, best verbal response, oriented, at arrival to emergency department: Secondary | ICD-10-CM | POA: Diagnosis present

## 2021-09-19 DIAGNOSIS — Y95 Nosocomial condition: Secondary | ICD-10-CM | POA: Diagnosis not present

## 2021-09-19 DIAGNOSIS — E785 Hyperlipidemia, unspecified: Secondary | ICD-10-CM | POA: Diagnosis present

## 2021-09-19 DIAGNOSIS — J9622 Acute and chronic respiratory failure with hypercapnia: Secondary | ICD-10-CM | POA: Diagnosis not present

## 2021-09-19 DIAGNOSIS — R000 Tachycardia, unspecified: Secondary | ICD-10-CM | POA: Diagnosis present

## 2021-09-19 DIAGNOSIS — J440 Chronic obstructive pulmonary disease with acute lower respiratory infection: Secondary | ICD-10-CM | POA: Diagnosis not present

## 2021-09-19 DIAGNOSIS — S065X0A Traumatic subdural hemorrhage without loss of consciousness, initial encounter: Secondary | ICD-10-CM | POA: Diagnosis present

## 2021-09-19 DIAGNOSIS — S72141D Displaced intertrochanteric fracture of right femur, subsequent encounter for closed fracture with routine healing: Secondary | ICD-10-CM | POA: Diagnosis present

## 2021-09-19 DIAGNOSIS — R636 Underweight: Secondary | ICD-10-CM | POA: Diagnosis present

## 2021-09-19 DIAGNOSIS — G8929 Other chronic pain: Secondary | ICD-10-CM | POA: Diagnosis present

## 2021-09-19 DIAGNOSIS — Z79891 Long term (current) use of opiate analgesic: Secondary | ICD-10-CM | POA: Diagnosis not present

## 2021-09-19 DIAGNOSIS — F419 Anxiety disorder, unspecified: Secondary | ICD-10-CM | POA: Diagnosis present

## 2021-09-19 DIAGNOSIS — I4891 Unspecified atrial fibrillation: Secondary | ICD-10-CM | POA: Diagnosis present

## 2021-09-19 DIAGNOSIS — T07XXXA Unspecified multiple injuries, initial encounter: Secondary | ICD-10-CM | POA: Diagnosis not present

## 2021-09-19 DIAGNOSIS — A419 Sepsis, unspecified organism: Secondary | ICD-10-CM | POA: Diagnosis not present

## 2021-09-19 DIAGNOSIS — G959 Disease of spinal cord, unspecified: Secondary | ICD-10-CM | POA: Diagnosis present

## 2021-09-19 DIAGNOSIS — M4302 Spondylolysis, cervical region: Secondary | ICD-10-CM | POA: Diagnosis present

## 2021-09-19 DIAGNOSIS — Z9049 Acquired absence of other specified parts of digestive tract: Secondary | ICD-10-CM | POA: Diagnosis not present

## 2021-09-19 DIAGNOSIS — Z716 Tobacco abuse counseling: Secondary | ICD-10-CM | POA: Diagnosis not present

## 2021-09-19 DIAGNOSIS — I63111 Cerebral infarction due to embolism of right vertebral artery: Secondary | ICD-10-CM | POA: Diagnosis not present

## 2021-09-19 DIAGNOSIS — J155 Pneumonia due to Escherichia coli: Secondary | ICD-10-CM | POA: Diagnosis not present

## 2021-09-19 DIAGNOSIS — S2243XA Multiple fractures of ribs, bilateral, initial encounter for closed fracture: Secondary | ICD-10-CM | POA: Diagnosis present

## 2021-09-19 DIAGNOSIS — Y92410 Unspecified street and highway as the place of occurrence of the external cause: Secondary | ICD-10-CM

## 2021-09-19 DIAGNOSIS — R64 Cachexia: Secondary | ICD-10-CM | POA: Diagnosis present

## 2021-09-19 DIAGNOSIS — I480 Paroxysmal atrial fibrillation: Secondary | ICD-10-CM | POA: Diagnosis present

## 2021-09-19 DIAGNOSIS — J9601 Acute respiratory failure with hypoxia: Secondary | ICD-10-CM | POA: Diagnosis not present

## 2021-09-19 DIAGNOSIS — K5900 Constipation, unspecified: Secondary | ICD-10-CM | POA: Diagnosis present

## 2021-09-19 DIAGNOSIS — J13 Pneumonia due to Streptococcus pneumoniae: Secondary | ICD-10-CM | POA: Diagnosis not present

## 2021-09-19 DIAGNOSIS — J9621 Acute and chronic respiratory failure with hypoxia: Secondary | ICD-10-CM | POA: Diagnosis not present

## 2021-09-19 DIAGNOSIS — I9589 Other hypotension: Secondary | ICD-10-CM | POA: Diagnosis present

## 2021-09-19 DIAGNOSIS — R6520 Severe sepsis without septic shock: Secondary | ICD-10-CM | POA: Diagnosis not present

## 2021-09-19 DIAGNOSIS — F32A Depression, unspecified: Secondary | ICD-10-CM | POA: Diagnosis present

## 2021-09-19 DIAGNOSIS — R6521 Severe sepsis with septic shock: Secondary | ICD-10-CM | POA: Diagnosis not present

## 2021-09-19 DIAGNOSIS — M532X2 Spinal instabilities, cervical region: Secondary | ICD-10-CM | POA: Diagnosis present

## 2021-09-19 DIAGNOSIS — J690 Pneumonitis due to inhalation of food and vomit: Secondary | ICD-10-CM | POA: Diagnosis not present

## 2021-09-19 HISTORY — DX: Anxiety disorder, unspecified: F41.9

## 2021-09-19 HISTORY — PX: FEMUR IM NAIL: SHX1597

## 2021-09-19 HISTORY — PX: ANTERIOR CERVICAL DECOMP/DISCECTOMY FUSION: SHX1161

## 2021-09-19 LAB — COMPREHENSIVE METABOLIC PANEL
ALT: 54 U/L — ABNORMAL HIGH (ref 0–44)
AST: 78 U/L — ABNORMAL HIGH (ref 15–41)
Albumin: 3.8 g/dL (ref 3.5–5.0)
Alkaline Phosphatase: 71 U/L (ref 38–126)
Anion gap: 9 (ref 5–15)
BUN: 10 mg/dL (ref 8–23)
CO2: 28 mmol/L (ref 22–32)
Calcium: 8.9 mg/dL (ref 8.9–10.3)
Chloride: 101 mmol/L (ref 98–111)
Creatinine, Ser: 0.96 mg/dL (ref 0.61–1.24)
GFR, Estimated: >60 mL/min (ref >60)
Glucose, Bld: 171 mg/dL — ABNORMAL HIGH (ref 70–99)
Potassium: 4.1 mmol/L (ref 3.5–5.1)
Sodium: 138 mmol/L (ref 135–145)
Total Bilirubin: 0.5 mg/dL (ref 0.3–1.2)
Total Protein: 6.7 g/dL (ref 6.5–8.1)

## 2021-09-19 LAB — POCT I-STAT 7, (LYTES, BLD GAS, ICA,H+H)
Acid-Base Excess: 1 mmol/L (ref 0.0–2.0)
Bicarbonate: 27.7 mmol/L (ref 20.0–28.0)
Calcium, Ion: 1.21 mmol/L (ref 1.15–1.40)
HCT: 30 % — ABNORMAL LOW (ref 39.0–52.0)
Hemoglobin: 10.2 g/dL — ABNORMAL LOW (ref 13.0–17.0)
O2 Saturation: 100 %
Patient temperature: 36.7
Potassium: 3.7 mmol/L (ref 3.5–5.1)
Sodium: 138 mmol/L (ref 135–145)
TCO2: 29 mmol/L (ref 22–32)
pCO2 arterial: 51.8 mmHg — ABNORMAL HIGH (ref 32.0–48.0)
pH, Arterial: 7.334 — ABNORMAL LOW (ref 7.350–7.450)
pO2, Arterial: 293 mmHg — ABNORMAL HIGH (ref 83.0–108.0)

## 2021-09-19 LAB — I-STAT CHEM 8, ED
BUN: 12 mg/dL (ref 8–23)
Calcium, Ion: 1.08 mmol/L — ABNORMAL LOW (ref 1.15–1.40)
Chloride: 100 mmol/L (ref 98–111)
Creatinine, Ser: 0.8 mg/dL (ref 0.61–1.24)
Glucose, Bld: 166 mg/dL — ABNORMAL HIGH (ref 70–99)
HCT: 40 % (ref 39.0–52.0)
Hemoglobin: 13.6 g/dL (ref 13.0–17.0)
Potassium: 4.1 mmol/L (ref 3.5–5.1)
Sodium: 139 mmol/L (ref 135–145)
TCO2: 30 mmol/L (ref 22–32)

## 2021-09-19 LAB — CBC
HCT: 39.6 % (ref 39.0–52.0)
Hemoglobin: 12.5 g/dL — ABNORMAL LOW (ref 13.0–17.0)
MCH: 29.8 pg (ref 26.0–34.0)
MCHC: 31.6 g/dL (ref 30.0–36.0)
MCV: 94.5 fL (ref 80.0–100.0)
Platelets: 206 10*3/uL (ref 150–400)
RBC: 4.19 MIL/uL — ABNORMAL LOW (ref 4.22–5.81)
RDW: 14.2 % (ref 11.5–15.5)
WBC: 7.7 10*3/uL (ref 4.0–10.5)
nRBC: 0 % (ref 0.0–0.2)

## 2021-09-19 LAB — ETHANOL: Alcohol, Ethyl (B): <10 mg/dL (ref <10)

## 2021-09-19 LAB — HIV ANTIBODY (ROUTINE TESTING W REFLEX): HIV Screen 4th Generation wRfx: NONREACTIVE

## 2021-09-19 LAB — RESP PANEL BY RT-PCR (FLU A&B, COVID) ARPGX2
Influenza A by PCR: NEGATIVE
Influenza B by PCR: NEGATIVE
SARS Coronavirus 2 by RT PCR: NEGATIVE

## 2021-09-19 LAB — SAMPLE TO BLOOD BANK

## 2021-09-19 LAB — PROTIME-INR
INR: 1 (ref 0.8–1.2)
Prothrombin Time: 13.6 seconds (ref 11.4–15.2)

## 2021-09-19 LAB — LACTIC ACID, PLASMA: Lactic Acid, Venous: 1.9 mmol/L (ref 0.5–1.9)

## 2021-09-19 LAB — ABO/RH: ABO/RH(D): O POS

## 2021-09-19 SURGERY — INSERTION, INTRAMEDULLARY ROD, FEMUR
Anesthesia: General | Site: Hip | Laterality: Right

## 2021-09-19 MED ORDER — CEFAZOLIN SODIUM-DEXTROSE 2-4 GM/100ML-% IV SOLN
2.0000 g | Freq: Three times a day (TID) | INTRAVENOUS | Status: AC
  Administered 2021-09-19 – 2021-09-20 (×3): 2 g via INTRAVENOUS
  Filled 2021-09-19 (×3): qty 100

## 2021-09-19 MED ORDER — PHENYLEPHRINE HCL (PRESSORS) 10 MG/ML IV SOLN
INTRAVENOUS | Status: DC | PRN
  Administered 2021-09-19: 120 ug via INTRAVENOUS
  Administered 2021-09-19: 40 ug via INTRAVENOUS

## 2021-09-19 MED ORDER — MIDODRINE HCL 5 MG PO TABS
5.0000 mg | ORAL_TABLET | Freq: Three times a day (TID) | ORAL | Status: DC
  Administered 2021-09-20 – 2021-09-22 (×7): 5 mg via ORAL
  Filled 2021-09-19 (×8): qty 1

## 2021-09-19 MED ORDER — ARIPIPRAZOLE 2 MG PO TABS
2.0000 mg | ORAL_TABLET | Freq: Every morning | ORAL | Status: DC
  Administered 2021-09-20 – 2021-09-22 (×3): 2 mg via ORAL
  Filled 2021-09-19 (×4): qty 1

## 2021-09-19 MED ORDER — PREGABALIN 75 MG PO CAPS
150.0000 mg | ORAL_CAPSULE | Freq: Two times a day (BID) | ORAL | Status: DC
  Administered 2021-09-19 – 2021-09-22 (×6): 150 mg via ORAL
  Filled 2021-09-19 (×6): qty 2

## 2021-09-19 MED ORDER — FENTANYL CITRATE (PF) 250 MCG/5ML IJ SOLN
INTRAMUSCULAR | Status: AC
  Filled 2021-09-19: qty 5

## 2021-09-19 MED ORDER — CHLORHEXIDINE GLUCONATE CLOTH 2 % EX PADS
6.0000 | MEDICATED_PAD | Freq: Every day | CUTANEOUS | Status: DC
  Administered 2021-09-21 – 2021-10-06 (×17): 6 via TOPICAL

## 2021-09-19 MED ORDER — OXYCODONE HCL 5 MG PO TABS: 5.0000 mg | ORAL_TABLET | ORAL | Status: DC | PRN

## 2021-09-19 MED ORDER — FENTANYL CITRATE (PF) 250 MCG/5ML IJ SOLN
INTRAMUSCULAR | Status: DC | PRN
  Administered 2021-09-19 (×6): 50 ug via INTRAVENOUS

## 2021-09-19 MED ORDER — HEMOSTATIC AGENTS (NO CHARGE) OPTIME
TOPICAL | Status: DC | PRN
  Administered 2021-09-19: 1 via TOPICAL

## 2021-09-19 MED ORDER — PANTOPRAZOLE SODIUM 40 MG PO TBEC
40.0000 mg | DELAYED_RELEASE_TABLET | Freq: Every day | ORAL | Status: DC
  Administered 2021-09-20 – 2021-09-24 (×3): 40 mg via ORAL
  Filled 2021-09-19 (×4): qty 1

## 2021-09-19 MED ORDER — CHLORHEXIDINE GLUCONATE 0.12 % MT SOLN
OROMUCOSAL | Status: AC
  Administered 2021-09-19: 15 mL via OROMUCOSAL
  Filled 2021-09-19: qty 15

## 2021-09-19 MED ORDER — POTASSIUM CHLORIDE IN NACL 20-0.9 MEQ/L-% IV SOLN
INTRAVENOUS | Status: DC
  Filled 2021-09-19: qty 1000

## 2021-09-19 MED ORDER — PHENYLEPHRINE HCL-NACL 20-0.9 MG/250ML-% IV SOLN
INTRAVENOUS | Status: DC | PRN
  Administered 2021-09-19: 25 ug/min via INTRAVENOUS

## 2021-09-19 MED ORDER — ONDANSETRON HCL 4 MG/2ML IJ SOLN: 4.0000 mg | Freq: Four times a day (QID) | INTRAMUSCULAR | Status: DC | PRN

## 2021-09-19 MED ORDER — 0.9 % SODIUM CHLORIDE (POUR BTL) OPTIME
TOPICAL | Status: DC | PRN
  Administered 2021-09-19: 1000 mL

## 2021-09-19 MED ORDER — CHLORHEXIDINE GLUCONATE 4 % EX LIQD: 60.0000 mL | Freq: Once | CUTANEOUS | Status: DC

## 2021-09-19 MED ORDER — LORATADINE 10 MG PO TABS
10.0000 mg | ORAL_TABLET | Freq: Every morning | ORAL | Status: DC
  Administered 2021-09-20 – 2021-09-21 (×2): 10 mg via ORAL
  Filled 2021-09-19 (×2): qty 1

## 2021-09-19 MED ORDER — MIDAZOLAM HCL 2 MG/2ML IJ SOLN
INTRAMUSCULAR | Status: AC
  Filled 2021-09-19: qty 2

## 2021-09-19 MED ORDER — ONDANSETRON HCL 4 MG PO TABS: 4.0000 mg | ORAL_TABLET | Freq: Four times a day (QID) | ORAL | Status: DC | PRN

## 2021-09-19 MED ORDER — ORAL CARE MOUTH RINSE: 15.0000 mL | Freq: Once | OROMUCOSAL | Status: AC

## 2021-09-19 MED ORDER — FENTANYL CITRATE (PF) 100 MCG/2ML IJ SOLN
INTRAMUSCULAR | Status: AC
  Administered 2021-09-19: 50 ug via INTRAVENOUS
  Filled 2021-09-19: qty 2

## 2021-09-19 MED ORDER — LIDOCAINE-EPINEPHRINE 0.5 %-1:200000 IJ SOLN
INTRAMUSCULAR | Status: AC
  Filled 2021-09-19: qty 1

## 2021-09-19 MED ORDER — THROMBIN 5000 UNITS EX SOLR
CUTANEOUS | Status: DC | PRN
  Administered 2021-09-19: 10000 [IU] via TOPICAL

## 2021-09-19 MED ORDER — CEFAZOLIN SODIUM-DEXTROSE 2-4 GM/100ML-% IV SOLN
2.0000 g | INTRAVENOUS | Status: AC
  Administered 2021-09-19: 2 g via INTRAVENOUS

## 2021-09-19 MED ORDER — TRANEXAMIC ACID-NACL 1000-0.7 MG/100ML-% IV SOLN
1000.0000 mg | Freq: Once | INTRAVENOUS | Status: AC
  Administered 2021-09-19: 1000 mg via INTRAVENOUS
  Filled 2021-09-19: qty 100

## 2021-09-19 MED ORDER — ACETAMINOPHEN 325 MG PO TABS: 650.0000 mg | ORAL_TABLET | ORAL | Status: DC | PRN

## 2021-09-19 MED ORDER — POLYETHYLENE GLYCOL 3350 17 G PO PACK: 17.0000 g | PACK | Freq: Every day | ORAL | Status: DC | PRN

## 2021-09-19 MED ORDER — CEFAZOLIN SODIUM-DEXTROSE 2-4 GM/100ML-% IV SOLN
INTRAVENOUS | Status: AC
  Filled 2021-09-19: qty 100

## 2021-09-19 MED ORDER — MENTHOL 3 MG MT LOZG
1.0000 | LOZENGE | OROMUCOSAL | Status: DC | PRN
  Filled 2021-09-19: qty 9

## 2021-09-19 MED ORDER — LACTATED RINGERS IV SOLN: INTRAVENOUS | Status: DC | PRN

## 2021-09-19 MED ORDER — ACETAMINOPHEN 650 MG RE SUPP: 650.0000 mg | RECTAL | Status: DC | PRN

## 2021-09-19 MED ORDER — POVIDONE-IODINE 10 % EX SWAB: 2.0000 "application " | Freq: Once | CUTANEOUS | Status: DC

## 2021-09-19 MED ORDER — DEXAMETHASONE SODIUM PHOSPHATE 10 MG/ML IJ SOLN
INTRAMUSCULAR | Status: DC | PRN
  Administered 2021-09-19: 10 mg via INTRAVENOUS

## 2021-09-19 MED ORDER — CLONAZEPAM 0.5 MG PO TABS
0.5000 mg | ORAL_TABLET | Freq: Every evening | ORAL | Status: DC
  Administered 2021-09-19 – 2021-09-21 (×3): 0.5 mg via ORAL
  Filled 2021-09-19 (×3): qty 1

## 2021-09-19 MED ORDER — VITAMIN D3 25 MCG (1000 UNIT) PO TABS
2000.0000 [IU] | ORAL_TABLET | Freq: Every evening | ORAL | Status: DC
  Administered 2021-09-19 – 2021-09-21 (×3): 2000 [IU] via ORAL
  Filled 2021-09-19 (×7): qty 2

## 2021-09-19 MED ORDER — FENTANYL CITRATE (PF) 100 MCG/2ML IJ SOLN: 50.0000 ug | INTRAMUSCULAR | Status: DC | PRN

## 2021-09-19 MED ORDER — SUCCINYLCHOLINE CHLORIDE 200 MG/10ML IV SOSY
PREFILLED_SYRINGE | INTRAVENOUS | Status: DC | PRN
Start: 2021-09-19 — End: 2021-09-19
  Administered 2021-09-19: 100 mg via INTRAVENOUS

## 2021-09-19 MED ORDER — PROPOFOL 10 MG/ML IV BOLUS
INTRAVENOUS | Status: AC
  Filled 2021-09-19: qty 20

## 2021-09-19 MED ORDER — ACETAMINOPHEN 325 MG PO TABS
650.0000 mg | ORAL_TABLET | Freq: Four times a day (QID) | ORAL | Status: DC
  Administered 2021-09-20 (×2): 650 mg via ORAL
  Filled 2021-09-19 (×2): qty 2

## 2021-09-19 MED ORDER — ONDANSETRON HCL 4 MG/2ML IJ SOLN
INTRAMUSCULAR | Status: DC | PRN
  Administered 2021-09-19: 4 mg via INTRAVENOUS

## 2021-09-19 MED ORDER — HYDROMORPHONE HCL 1 MG/ML IJ SOLN
0.2500 mg | INTRAMUSCULAR | Status: DC | PRN
  Administered 2021-09-19 (×2): 0.5 mg via INTRAVENOUS
  Administered 2021-09-19 (×2): 0.25 mg via INTRAVENOUS

## 2021-09-19 MED ORDER — PROPOFOL 10 MG/ML IV BOLUS
INTRAVENOUS | Status: DC | PRN
Start: 2021-09-19 — End: 2021-09-19
  Administered 2021-09-19: 100 mg via INTRAVENOUS

## 2021-09-19 MED ORDER — TAMSULOSIN HCL 0.4 MG PO CAPS
0.4000 mg | ORAL_CAPSULE | Freq: Every evening | ORAL | Status: DC
  Administered 2021-09-19 – 2021-09-21 (×3): 0.4 mg via ORAL
  Filled 2021-09-19 (×4): qty 1

## 2021-09-19 MED ORDER — TRAZODONE HCL 50 MG PO TABS
150.0000 mg | ORAL_TABLET | Freq: Every evening | ORAL | Status: DC
  Administered 2021-09-19 – 2021-09-21 (×3): 150 mg via ORAL
  Filled 2021-09-19 (×2): qty 1
  Filled 2021-09-19: qty 3
  Filled 2021-09-19: qty 1

## 2021-09-19 MED ORDER — METOCLOPRAMIDE HCL 5 MG PO TABS
5.0000 mg | ORAL_TABLET | Freq: Three times a day (TID) | ORAL | Status: DC | PRN
  Filled 2021-09-19: qty 2

## 2021-09-19 MED ORDER — FOOD THICKENER (SIMPLYTHICK)
1.0000 | ORAL | Status: DC | PRN
  Administered 2021-09-19 – 2021-09-20 (×2): 1 via ORAL

## 2021-09-19 MED ORDER — SUGAMMADEX SODIUM 200 MG/2ML IV SOLN
INTRAVENOUS | Status: DC | PRN
  Administered 2021-09-19: 200 mg via INTRAVENOUS

## 2021-09-19 MED ORDER — OXYCODONE HCL 5 MG PO TABS
10.0000 mg | ORAL_TABLET | ORAL | Status: DC | PRN
  Administered 2021-09-19 – 2021-09-20 (×3): 10 mg via ORAL
  Filled 2021-09-19 (×3): qty 2

## 2021-09-19 MED ORDER — METHOCARBAMOL 1000 MG/10ML IJ SOLN
1000.0000 mg | Freq: Three times a day (TID) | INTRAVENOUS | Status: DC | PRN
  Administered 2021-09-20 – 2021-09-30 (×5): 1000 mg via INTRAVENOUS
  Filled 2021-09-19: qty 10
  Filled 2021-09-19: qty 1000
  Filled 2021-09-19 (×4): qty 10

## 2021-09-19 MED ORDER — IOHEXOL 300 MG/ML  SOLN
100.0000 mL | Freq: Once | INTRAMUSCULAR | Status: AC | PRN
  Administered 2021-09-19: 100 mL via INTRAVENOUS

## 2021-09-19 MED ORDER — LIDOCAINE 2% (20 MG/ML) 5 ML SYRINGE
INTRAMUSCULAR | Status: DC | PRN
  Administered 2021-09-19: 60 mg via INTRAVENOUS

## 2021-09-19 MED ORDER — PANTOPRAZOLE SODIUM 40 MG IV SOLR
40.0000 mg | Freq: Every day | INTRAVENOUS | Status: DC
  Administered 2021-09-19 – 2021-09-23 (×4): 40 mg via INTRAVENOUS
  Filled 2021-09-19 (×4): qty 40

## 2021-09-19 MED ORDER — FENTANYL CITRATE PF 50 MCG/ML IJ SOSY
50.0000 ug | PREFILLED_SYRINGE | Freq: Once | INTRAMUSCULAR | Status: AC
  Administered 2021-09-19: 50 ug via INTRAVENOUS

## 2021-09-19 MED ORDER — ONDANSETRON 4 MG PO TBDP: 4.0000 mg | ORAL_TABLET | Freq: Four times a day (QID) | ORAL | Status: DC | PRN

## 2021-09-19 MED ORDER — SODIUM CHLORIDE 0.9% FLUSH: 3.0000 mL | INTRAVENOUS | Status: DC | PRN

## 2021-09-19 MED ORDER — CHLORHEXIDINE GLUCONATE 0.12 % MT SOLN
OROMUCOSAL | Status: AC
  Filled 2021-09-19: qty 15

## 2021-09-19 MED ORDER — FENTANYL CITRATE (PF) 100 MCG/2ML IJ SOLN
INTRAMUSCULAR | Status: AC
  Filled 2021-09-19: qty 2

## 2021-09-19 MED ORDER — CHLORHEXIDINE GLUCONATE 0.12 % MT SOLN: 15.0000 mL | Freq: Once | OROMUCOSAL | Status: AC

## 2021-09-19 MED ORDER — HYDROMORPHONE HCL 1 MG/ML IJ SOLN
INTRAMUSCULAR | Status: AC
  Filled 2021-09-19: qty 1

## 2021-09-19 MED ORDER — LIDOCAINE-EPINEPHRINE 0.5 %-1:200000 IJ SOLN
INTRAMUSCULAR | Status: DC | PRN
  Administered 2021-09-19: 3 mL

## 2021-09-19 MED ORDER — LACTATED RINGERS IV SOLN: INTRAVENOUS | Status: DC

## 2021-09-19 MED ORDER — THROMBIN 5000 UNITS EX SOLR
CUTANEOUS | Status: AC
  Filled 2021-09-19: qty 10000

## 2021-09-19 MED ORDER — SODIUM CHLORIDE 0.9 % IV SOLN: 250.0000 mL | INTRAVENOUS | Status: DC

## 2021-09-19 MED ORDER — PANTOPRAZOLE SODIUM 40 MG PO TBEC: 80.0000 mg | DELAYED_RELEASE_TABLET | Freq: Every day | ORAL | Status: DC

## 2021-09-19 MED ORDER — ROCURONIUM BROMIDE 10 MG/ML (PF) SYRINGE
PREFILLED_SYRINGE | INTRAVENOUS | Status: DC | PRN
  Administered 2021-09-19: 40 mg via INTRAVENOUS
  Administered 2021-09-19: 50 mg via INTRAVENOUS

## 2021-09-19 MED ORDER — UMECLIDINIUM BROMIDE 62.5 MCG/ACT IN AEPB
1.0000 | INHALATION_SPRAY | Freq: Every day | RESPIRATORY_TRACT | Status: DC
  Administered 2021-09-20 – 2021-10-06 (×16): 1 via RESPIRATORY_TRACT
  Filled 2021-09-19 (×3): qty 7

## 2021-09-19 MED ORDER — ALBUTEROL SULFATE (2.5 MG/3ML) 0.083% IN NEBU
2.5000 mg | INHALATION_SOLUTION | Freq: Four times a day (QID) | RESPIRATORY_TRACT | Status: DC | PRN
  Administered 2021-09-21: 2.5 mg via RESPIRATORY_TRACT
  Filled 2021-09-19 (×2): qty 3

## 2021-09-19 MED ORDER — SODIUM CHLORIDE 0.9% FLUSH
3.0000 mL | Freq: Two times a day (BID) | INTRAVENOUS | Status: DC
  Administered 2021-09-19 – 2021-10-01 (×20): 3 mL via INTRAVENOUS
  Administered 2021-10-01: 10 mL via INTRAVENOUS
  Administered 2021-10-01 – 2021-10-06 (×10): 3 mL via INTRAVENOUS

## 2021-09-19 MED ORDER — ALBUMIN HUMAN 5 % IV SOLN: INTRAVENOUS | Status: DC | PRN

## 2021-09-19 MED ORDER — PHENOL 1.4 % MT LIQD: 1.0000 | OROMUCOSAL | Status: DC | PRN

## 2021-09-19 MED ORDER — PRAVASTATIN SODIUM 10 MG PO TABS
20.0000 mg | ORAL_TABLET | Freq: Every evening | ORAL | Status: DC
  Administered 2021-09-19 – 2021-09-21 (×3): 20 mg via ORAL
  Filled 2021-09-19 (×4): qty 2

## 2021-09-19 MED ORDER — HYDROMORPHONE HCL 1 MG/ML IJ SOLN
INTRAMUSCULAR | Status: AC
  Filled 2021-09-19: qty 0.5

## 2021-09-19 MED ORDER — VENLAFAXINE HCL 37.5 MG PO TABS
37.5000 mg | ORAL_TABLET | Freq: Two times a day (BID) | ORAL | Status: DC
  Administered 2021-09-19 – 2021-09-22 (×6): 37.5 mg via ORAL
  Filled 2021-09-19 (×9): qty 1

## 2021-09-19 MED ORDER — ONDANSETRON HCL 4 MG/2ML IJ SOLN
4.0000 mg | Freq: Four times a day (QID) | INTRAMUSCULAR | Status: DC | PRN
  Administered 2021-09-22 – 2021-09-23 (×2): 4 mg via INTRAVENOUS
  Filled 2021-09-19 (×2): qty 2

## 2021-09-19 MED ORDER — METOCLOPRAMIDE HCL 5 MG/ML IJ SOLN: 5.0000 mg | Freq: Three times a day (TID) | INTRAMUSCULAR | Status: DC | PRN

## 2021-09-19 MED ORDER — MIDAZOLAM HCL 2 MG/2ML IJ SOLN
INTRAMUSCULAR | Status: DC | PRN
  Administered 2021-09-19: 2 mg via INTRAVENOUS

## 2021-09-19 MED ORDER — HYDROMORPHONE HCL 1 MG/ML IJ SOLN
0.5000 mg | INTRAMUSCULAR | Status: DC | PRN
  Administered 2021-09-19 – 2021-09-25 (×19): 0.5 mg via INTRAVENOUS
  Filled 2021-09-19 (×19): qty 1

## 2021-09-19 MED ORDER — DOCUSATE SODIUM 100 MG PO CAPS
100.0000 mg | ORAL_CAPSULE | Freq: Two times a day (BID) | ORAL | Status: DC
  Administered 2021-09-20 – 2021-09-22 (×4): 100 mg via ORAL
  Filled 2021-09-19 (×5): qty 1

## 2021-09-19 MED ORDER — ARFORMOTEROL TARTRATE 15 MCG/2ML IN NEBU
15.0000 ug | INHALATION_SOLUTION | Freq: Two times a day (BID) | RESPIRATORY_TRACT | Status: DC
  Administered 2021-09-19 – 2021-10-06 (×34): 15 ug via RESPIRATORY_TRACT
  Filled 2021-09-19 (×35): qty 2

## 2021-09-19 SURGICAL SUPPLY — 104 items
ADH SKN CLS APL DERMABOND .7 (GAUZE/BANDAGES/DRESSINGS) ×2
ADH SKN CLS LQ APL DERMABOND (GAUZE/BANDAGES/DRESSINGS) ×4
ALLOGRAFT 7X14X11 (Bone Implant) ×1 IMPLANT
ALLOGRAFT CA 6X14X11 (Bone Implant) ×1 IMPLANT
APL PRP STRL LF DISP 70% ISPRP (MISCELLANEOUS) ×2
BAG COUNTER SPONGE SURGICOUNT (BAG) ×6 IMPLANT
BAG SPNG CNTER NS LX DISP (BAG) ×4
BAND INSRT 18 STRL LF DISP RB (MISCELLANEOUS) ×4
BAND RUBBER #18 3X1/16 STRL (MISCELLANEOUS) ×6 IMPLANT
BIT DRILL INTERTAN LAG SCREW (BIT) ×1 IMPLANT
BIT DRILL SHORT 4.0 (BIT) IMPLANT
BLADE CLIPPER SURG (BLADE) IMPLANT
BLADE SURG 10 STRL SS (BLADE) ×6 IMPLANT
BNDG COHESIVE 4X5 TAN STRL (GAUZE/BANDAGES/DRESSINGS) ×3 IMPLANT
BNDG COHESIVE 6X5 TAN STRL LF (GAUZE/BANDAGES/DRESSINGS) IMPLANT
BNDG ELASTIC 4X5.8 VLCR STR LF (GAUZE/BANDAGES/DRESSINGS) ×2 IMPLANT
BNDG ELASTIC 6X5.8 VLCR STR LF (GAUZE/BANDAGES/DRESSINGS) ×3 IMPLANT
BRUSH SCRUB EZ PLAIN DRY (MISCELLANEOUS) ×5 IMPLANT
BUR DRUM 4.0 (BURR) ×3 IMPLANT
BUR MATCHSTICK NEURO 3.0 LAGG (BURR) ×3 IMPLANT
CANISTER SUCT 3000ML PPV (MISCELLANEOUS) ×3 IMPLANT
CARTRIDGE OIL MAESTRO DRILL (MISCELLANEOUS) ×2 IMPLANT
CHLORAPREP W/TINT 26 (MISCELLANEOUS) ×3 IMPLANT
COVER SURGICAL LIGHT HANDLE (MISCELLANEOUS) ×2 IMPLANT
DECANTER SPIKE VIAL GLASS SM (MISCELLANEOUS) ×3 IMPLANT
DERMABOND ADHESIVE PROPEN (GAUZE/BANDAGES/DRESSINGS) ×2
DERMABOND ADVANCED (GAUZE/BANDAGES/DRESSINGS) ×1
DERMABOND ADVANCED .7 DNX12 (GAUZE/BANDAGES/DRESSINGS) ×2 IMPLANT
DERMABOND ADVANCED .7 DNX6 (GAUZE/BANDAGES/DRESSINGS) IMPLANT
DIFFUSER DRILL AIR PNEUMATIC (MISCELLANEOUS) ×3 IMPLANT
DRAPE C-ARM 35X43 STRL (DRAPES) ×3 IMPLANT
DRAPE C-ARMOR (DRAPES) ×3 IMPLANT
DRAPE HALF SHEET 40X57 (DRAPES) ×6 IMPLANT
DRAPE IMP U-DRAPE 54X76 (DRAPES) ×6 IMPLANT
DRAPE INCISE IOBAN 66X45 STRL (DRAPES) ×3 IMPLANT
DRAPE LAPAROTOMY 100X72 PEDS (DRAPES) ×3 IMPLANT
DRAPE MICROSCOPE LEICA (MISCELLANEOUS) ×3 IMPLANT
DRAPE ORTHO SPLIT 77X108 STRL (DRAPES)
DRAPE SURG 17X23 STRL (DRAPES) ×2 IMPLANT
DRAPE SURG ORHT 6 SPLT 77X108 (DRAPES) ×4 IMPLANT
DRAPE U-SHAPE 47X51 STRL (DRAPES) ×3 IMPLANT
DRESSING MEPILEX FLEX 4X4 (GAUZE/BANDAGES/DRESSINGS) IMPLANT
DRILL BIT SHORT 4.0 (BIT) ×3
DRSG MEPILEX BORDER 4X4 (GAUZE/BANDAGES/DRESSINGS) ×9 IMPLANT
DRSG MEPILEX BORDER 4X8 (GAUZE/BANDAGES/DRESSINGS) ×3 IMPLANT
DRSG MEPILEX FLEX 4X4 (GAUZE/BANDAGES/DRESSINGS) ×3
DURAPREP 6ML APPLICATOR 50/CS (WOUND CARE) ×3 IMPLANT
ELECT COATED BLADE 2.86 ST (ELECTRODE) ×3 IMPLANT
ELECT REM PT RETURN 9FT ADLT (ELECTROSURGICAL) ×3
ELECTRODE REM PT RTRN 9FT ADLT (ELECTROSURGICAL) ×4 IMPLANT
GAUZE 4X4 16PLY ~~LOC~~+RFID DBL (SPONGE) ×1 IMPLANT
GLOVE EXAM NITRILE XL STR (GLOVE) IMPLANT
GLOVE SURG ENC MOIS LTX SZ6.5 (GLOVE) ×9 IMPLANT
GLOVE SURG ENC MOIS LTX SZ7.5 (GLOVE) ×9 IMPLANT
GLOVE SURG LTX SZ6.5 (GLOVE) ×3 IMPLANT
GLOVE SURG UNDER POLY LF SZ6.5 (GLOVE) ×3 IMPLANT
GLOVE SURG UNDER POLY LF SZ7.5 (GLOVE) ×3 IMPLANT
GOWN STRL REUS W/ TWL LRG LVL3 (GOWN DISPOSABLE) ×10 IMPLANT
GOWN STRL REUS W/ TWL XL LVL3 (GOWN DISPOSABLE) ×2 IMPLANT
GOWN STRL REUS W/TWL 2XL LVL3 (GOWN DISPOSABLE) IMPLANT
GOWN STRL REUS W/TWL LRG LVL3 (GOWN DISPOSABLE) ×15
GOWN STRL REUS W/TWL XL LVL3 (GOWN DISPOSABLE) ×3
GUIDE PIN 3.2X343 (PIN) ×4
GUIDE PIN 3.2X343MM (PIN) ×6
GUIDE ROD 3.0 (MISCELLANEOUS) ×3
KIT BASIN OR (CUSTOM PROCEDURE TRAY) ×6 IMPLANT
KIT TURNOVER KIT B (KITS) ×6 IMPLANT
MANIFOLD NEPTUNE II (INSTRUMENTS) ×2 IMPLANT
NAIL LOCK CANN 10X400 130D RT (Nail) ×1 IMPLANT
NDL HYPO 25X1 1.5 SAFETY (NEEDLE) ×2 IMPLANT
NDL SPNL 22GX3.5 QUINCKE BK (NEEDLE) ×2 IMPLANT
NEEDLE HYPO 25X1 1.5 SAFETY (NEEDLE) ×3 IMPLANT
NEEDLE SPNL 22GX3.5 QUINCKE BK (NEEDLE) ×3 IMPLANT
NS IRRIG 1000ML POUR BTL (IV SOLUTION) ×6 IMPLANT
OIL CARTRIDGE MAESTRO DRILL (MISCELLANEOUS) ×3
PACK GENERAL/GYN (CUSTOM PROCEDURE TRAY) ×3 IMPLANT
PACK LAMINECTOMY NEURO (CUSTOM PROCEDURE TRAY) ×3 IMPLANT
PAD ARMBOARD 7.5X6 YLW CONV (MISCELLANEOUS) ×10 IMPLANT
PIN DISTRACTION 14MM (PIN) IMPLANT
PIN GUIDE 3.2X343MM (PIN) IMPLANT
PLATE ACP 1-LEVEL1.6V22 (Plate) ×1 IMPLANT
ROD GUIDE 3.0 (MISCELLANEOUS) IMPLANT
SCREW ACP ST VARI 3.5X13 (Screw) ×2 IMPLANT
SCREW ACP VA ST 3.5X15 (Screw) ×1 IMPLANT
SCREW LAG COMPR KIT 100/95 (Screw) ×1 IMPLANT
SCREW TRIGEN LOW PROF 5.0X42.5 (Screw) ×1 IMPLANT
SCREW VA ST 4.0X15 (Screw) ×1 IMPLANT
SPONGE INTESTINAL PEANUT (DISPOSABLE) ×3 IMPLANT
SPONGE SURGIFOAM ABS GEL SZ50 (HEMOSTASIS) ×3 IMPLANT
SPONGE T-LAP 4X18 ~~LOC~~+RFID (SPONGE) ×1 IMPLANT
STAPLER VISISTAT 35W (STAPLE) ×3 IMPLANT
STOCKINETTE IMPERVIOUS LG (DRAPES) ×2 IMPLANT
SUT ETHILON 3 0 PS 1 (SUTURE) ×3 IMPLANT
SUT MNCRL AB 3-0 PS2 18 (SUTURE) ×4 IMPLANT
SUT VIC AB 0 CT1 27 (SUTURE) ×3
SUT VIC AB 0 CT1 27XBRD ANBCTR (SUTURE) IMPLANT
SUT VIC AB 0 CT1 27XBRD ANTBC (SUTURE) IMPLANT
SUT VIC AB 2-0 CT1 27 (SUTURE) ×6
SUT VIC AB 2-0 CT1 TAPERPNT 27 (SUTURE) ×4 IMPLANT
SUT VIC AB 3-0 SH 8-18 (SUTURE) ×4 IMPLANT
TOWEL GREEN STERILE (TOWEL DISPOSABLE) ×9 IMPLANT
TOWEL GREEN STERILE FF (TOWEL DISPOSABLE) ×6 IMPLANT
UNDERPAD 30X36 HEAVY ABSORB (UNDERPADS AND DIAPERS) ×3 IMPLANT
WATER STERILE IRR 1000ML POUR (IV SOLUTION) ×5 IMPLANT
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
## 2021-09-19 NOTE — Anesthesia Postprocedure Evaluation (Signed)
Anesthesia Post Note  Patient: JOSH NICOLOSI  Procedure(s) Performed: INTRAMEDULLARY (IM) NAIL FEMORAL (Right: Hip) ANTERIOR CERVICAL DECOMPRESSION/DISCECTOMY FUSION 1 LEVEL CERVICAL THREE-FOUR     Patient location during evaluation: PACU Anesthesia Type: General Level of consciousness: awake and alert, patient cooperative and oriented Pain management: pain level controlled Vital Signs Assessment: post-procedure vital signs reviewed and stable Respiratory status: spontaneous breathing, nonlabored ventilation and respiratory function stable Cardiovascular status: blood pressure returned to baseline and stable Postop Assessment: no apparent nausea or vomiting Anesthetic complications: no   No notable events documented.  Last Vitals:  Vitals:   09/19/21 1925 09/19/21 1940  BP: 132/83 (!) 153/84  Pulse: 97 94  Resp: 20 20  Temp:  37.1 C  SpO2: 97% 100%    Last Pain:  Vitals:   09/19/21 1910  TempSrc:   PainSc: 8                  Tenya Araque,E. Toshiye Kever
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
## 2021-09-19 NOTE — Anesthesia Preprocedure Evaluation (Addendum)
Anesthesia Evaluation  Patient identified by MRN, date of birth, ID band Patient awake    Reviewed: Allergy & Precautions, H&P , NPO status , Patient's Chart, lab work & pertinent test results  Airway Mallampati: I  TM Distance: >3 FB Neck ROM: Full    Dental no notable dental hx. (+) Edentulous Upper, Edentulous Lower, Dental Advisory Given   Pulmonary Current Smoker,    Pulmonary exam normal breath sounds clear to auscultation       Cardiovascular negative cardio ROS   Rhythm:Regular Rate:Normal     Neuro/Psych Anxiety negative neurological ROS     GI/Hepatic negative GI ROS, Neg liver ROS,   Endo/Other  negative endocrine ROS  Renal/GU negative Renal ROS  negative genitourinary   Musculoskeletal   Abdominal   Peds  Hematology negative hematology ROS (+)   Anesthesia Other Findings   Reproductive/Obstetrics negative OB ROS                            Anesthesia Physical Anesthesia Plan  ASA: 2 and emergent  Anesthesia Plan: General   Post-op Pain Management:    Induction: Intravenous, Rapid sequence and Cricoid pressure planned  PONV Risk Score and Plan: 2 and Ondansetron, Dexamethasone and Midazolam  Airway Management Planned: Oral ETT and Video Laryngoscope Planned  Additional Equipment:   Intra-op Plan:   Post-operative Plan: Extubation in OR  Informed Consent: I have reviewed the patients History and Physical, chart, labs and discussed the procedure including the risks, benefits and alternatives for the proposed anesthesia with the patient or authorized representative who has indicated his/her understanding and acceptance.     Dental advisory given  Plan Discussed with: CRNA  Anesthesia Plan Comments:        Anesthesia Quick Evaluation
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
## 2021-09-19 NOTE — ED Notes (Signed)
Patient transported to CT with Elmyra Ricks, TRN

## 2021-09-19 NOTE — ED Notes (Signed)
Report given Leander Rams, RN of Short Stay [facility]

## 2021-09-19 NOTE — Progress Notes (Signed)
Orthopedic Tech Progress Note Patient Details:  Mark [name] 1955/09/20 [id number]  Patient ID: Mark [name], male   DOB: October 30, 1955, 66 y.o.   MRN: [id number] Level 1; not needed at the moment.  Vernona Rieger 09/19/2021, 10:22 AM
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
## 2021-09-19 NOTE — Anesthesia Procedure Notes (Signed)
Procedure Name: Intubation Date/Time: 09/19/2021 2:53 PM Performed by: Clearnce Sorrel, CRNA Pre-anesthesia Checklist: Patient identified, Emergency Drugs available, Suction available and Patient being monitored Patient Re-evaluated:Patient Re-evaluated prior to induction Oxygen Delivery Method: Circle System Utilized Preoxygenation: Pre-oxygenation with 100% oxygen Induction Type: IV induction, Rapid sequence and Cricoid Pressure applied Laryngoscope Size: Glidescope and 4 (limited neck ROM) Grade View: Grade I Tube type: Oral Tube size: 7.5 mm Number of attempts: 1 Airway Equipment and Method: Stylet and Oral airway Placement Confirmation: ETT inserted through vocal cords under direct vision, positive ETCO2 and breath sounds checked- equal and bilateral Secured at: 23 cm Tube secured with: Tape Dental Injury: Teeth and Oropharynx as per pre-operative assessment

## 2021-09-19 NOTE — Progress Notes (Signed)
Per pt & pt's wife Anette, pt drinks nectar thick liquids at home. Thickener ordered .

## 2021-09-19 NOTE — Anesthesia Procedure Notes (Signed)
Arterial Line Insertion Start/End2/09/2021 3:00 PM  Patient location: Pre-op. Preanesthetic checklist: patient identified, IV checked, site marked, risks and benefits discussed, surgical consent, monitors and equipment checked, pre-op evaluation, timeout performed and anesthesia consent Lidocaine 1% used for infiltration Left, radial was placed Catheter size: 20 G Hand hygiene performed  and maximum sterile barriers used   Attempts: 1 Procedure performed without using ultrasound guided technique. Following insertion, dressing applied. Post procedure assessment: normal and unchanged
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
## 2021-09-19 NOTE — Transfer of Care (Signed)
Immediate Anesthesia Transfer of Care Note  Patient: Mark [name]  Procedure(s) Performed: INTRAMEDULLARY (IM) NAIL FEMORAL (Right: Hip) ANTERIOR CERVICAL DECOMPRESSION/DISCECTOMY FUSION 1 LEVEL CERVICAL THREE-FOUR  Patient Location: PACU  Anesthesia Type:General  Level of Consciousness: patient cooperative  Airway & Oxygen Therapy: Patient Spontanous Breathing and Patient connected to nasal cannula oxygen  Post-op Assessment: Report given to RN and Post -op Vital signs reviewed and stable  Post vital signs: Reviewed and stable  Last Vitals:  Vitals Value Taken Time  BP 118/76 09/19/21 1839  Temp    Pulse 86 09/19/21 1841  Resp 25 09/19/21 1841  SpO2 94 % 09/19/21 1841  Vitals shown include unvalidated device data.  Last Pain:  Vitals:   09/19/21 1316  TempSrc: Oral  PainSc:          Complications: No notable events documented.
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
## 2021-09-20 ENCOUNTER — Inpatient Hospital Stay (HOSPITAL_COMMUNITY): Payer: Medicare Other

## 2021-09-20 ENCOUNTER — Other Ambulatory Visit: Payer: Self-pay

## 2021-09-20 ENCOUNTER — Encounter (HOSPITAL_COMMUNITY): Payer: Self-pay | Admitting: Neurosurgery

## 2021-09-20 LAB — BASIC METABOLIC PANEL
Anion gap: 5 (ref 5–15)
BUN: 10 mg/dL (ref 8–23)
CO2: 27 mmol/L (ref 22–32)
Calcium: 7.8 mg/dL — ABNORMAL LOW (ref 8.9–10.3)
Chloride: 105 mmol/L (ref 98–111)
Creatinine, Ser: 0.9 mg/dL (ref 0.61–1.24)
GFR, Estimated: >60 mL/min (ref >60)
Glucose, Bld: 137 mg/dL — ABNORMAL HIGH (ref 70–99)
Potassium: 4.1 mmol/L (ref 3.5–5.1)
Sodium: 137 mmol/L (ref 135–145)

## 2021-09-20 LAB — URINALYSIS, ROUTINE W REFLEX MICROSCOPIC
Bilirubin Urine: NEGATIVE
Glucose, UA: NEGATIVE mg/dL
Hgb urine dipstick: NEGATIVE
Ketones, ur: NEGATIVE mg/dL
Leukocytes,Ua: NEGATIVE
Nitrite: NEGATIVE
Protein, ur: NEGATIVE mg/dL
Specific Gravity, Urine: 1.025 (ref 1.005–1.030)
pH: 6 (ref 5.0–8.0)

## 2021-09-20 LAB — MRSA NEXT GEN BY PCR, NASAL: MRSA by PCR Next Gen: DETECTED — AB

## 2021-09-20 LAB — PREPARE RBC (CROSSMATCH)

## 2021-09-20 LAB — CBC
HCT: 24 % — ABNORMAL LOW (ref 39.0–52.0)
Hemoglobin: 7.6 g/dL — ABNORMAL LOW (ref 13.0–17.0)
MCH: 28.9 pg (ref 26.0–34.0)
MCHC: 31.7 g/dL (ref 30.0–36.0)
MCV: 91.3 fL (ref 80.0–100.0)
Platelets: 127 10*3/uL — ABNORMAL LOW (ref 150–400)
RBC: 2.63 MIL/uL — ABNORMAL LOW (ref 4.22–5.81)
RDW: 14.4 % (ref 11.5–15.5)
WBC: 7.9 10*3/uL (ref 4.0–10.5)
nRBC: 0 % (ref 0.0–0.2)

## 2021-09-20 LAB — VITAMIN D 25 HYDROXY (VIT D DEFICIENCY, FRACTURES): Vit D, 25-Hydroxy: 29.34 ng/mL — ABNORMAL LOW (ref 30–100)

## 2021-09-20 MED ORDER — SODIUM CHLORIDE 0.9% IV SOLUTION: Freq: Once | INTRAVENOUS | Status: DC

## 2021-09-20 MED ORDER — OXYCODONE HCL 5 MG PO TABS
10.0000 mg | ORAL_TABLET | ORAL | Status: DC | PRN
  Administered 2021-09-21 – 2021-09-22 (×3): 10 mg via ORAL
  Filled 2021-09-20 (×3): qty 2

## 2021-09-20 MED ORDER — ACETAMINOPHEN 500 MG PO TABS
1000.0000 mg | ORAL_TABLET | Freq: Four times a day (QID) | ORAL | Status: DC
  Administered 2021-09-20 – 2021-09-23 (×8): 1000 mg via ORAL
  Filled 2021-09-20 (×8): qty 2

## 2021-09-20 MED ORDER — OXYCODONE HCL 5 MG PO TABS
15.0000 mg | ORAL_TABLET | ORAL | Status: DC | PRN
  Administered 2021-09-20 – 2021-09-23 (×9): 15 mg via ORAL
  Filled 2021-09-20 (×9): qty 3

## 2021-09-20 MED ORDER — ALBUMIN HUMAN 5 % IV SOLN
25.0000 g | Freq: Once | INTRAVENOUS | Status: AC
  Administered 2021-09-20: 25 g via INTRAVENOUS
  Filled 2021-09-20: qty 500

## 2021-09-20 MED ORDER — LACTATED RINGERS IV SOLN: INTRAVENOUS | Status: DC

## 2021-09-20 MED ORDER — NICOTINE 14 MG/24HR TD PT24
14.0000 mg | MEDICATED_PATCH | Freq: Every day | TRANSDERMAL | Status: DC
  Administered 2021-09-20 – 2021-10-06 (×17): 14 mg via TRANSDERMAL
  Filled 2021-09-20 (×18): qty 1

## 2021-09-20 MED ORDER — MUPIROCIN 2 % EX OINT
1.0000 "application " | TOPICAL_OINTMENT | Freq: Two times a day (BID) | CUTANEOUS | Status: AC
  Administered 2021-09-20 – 2021-09-24 (×10): 1 via NASAL
  Filled 2021-09-20 (×3): qty 22
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
## 2021-09-20 NOTE — Progress Notes (Signed)
Dr. Barry Dienes paged about patient's hypotension. Verbal order received to prepare and transfuse 2 units of packed red blood cells. Verbal to give 25 grams of albumin human 5%.
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
## 2021-09-20 NOTE — Progress Notes (Addendum)
OT Cancellation Note  Patient Details Name: Mark [name] MRN: [id number] DOB: August 03, 1956   Cancelled Treatment:    Reason Eval/Treat Not Completed: Medical issues which prohibited therapy.  Low BP, patient scheduled for transfusion, and discussed with RN waiting until first unit has been given.  OT to continue efforts.   Kieara Schwark D Tyeisha Dinan 09/20/2021, 8:32 AM

## 2021-09-20 NOTE — TOC CAGE-AID Note (Signed)
Transition of Care [facility]) - CAGE-AID Screening   Patient Details  Name: Mark [name] MRN: [id number] Date of Birth: [birth year]  Transition of [facility]) CM/SW Contact:    Lyrica Mcclarty C Tarpley-Carter, Odem Phone Number: 09/20/2021, 10:03 AM   Clinical Narrative: Pt participated in [place].  Pt stated he does use substance (smokes cigarettes), pt does not drink ETOH.  Pt was offered resources, due to usage of substance (smoking).    Saysha Menta Tarpley-Carter, MSW, LCSW-A Pronouns:  She/Her/Hers [place] Transitions of Care Clinical Social Worker Direct Number:  [phone] [email or web address]  CAGE-AID Screening:    Have You Ever Felt You Ought to Cut Down on Your Drinking or Drug Use?: No Have People Annoyed You By [employer] Your Drinking Or Drug Use?: No Have You Felt Bad Or Guilty About Your Drinking Or Drug Use?: No Have You Ever Had a Drink or Used Drugs First Thing In The Morning to Steady Your Nerves or to Get Rid of a Hangover?: No CAGE-AID Score: 0  Substance Abuse Education Offered: Yes  Substance abuse interventions: Scientist, clinical (histocompatibility and immunogenetics)
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
## 2021-09-20 NOTE — Progress Notes (Signed)
°  Transition of Care [facility]) Screening Note   Patient Details  Name: Mark [name] Date of Birth: June 08, 1956   [facility]) CM/SW Contact:    Ella Bodo, RN Phone Number: 09/20/2021, 4:49 PM    Transition of Care Department [facility]) has reviewed patient and no TOC needs have been identified at this time. We will continue to monitor patient advancement through interdisciplinary progression rounds. If new patient transition needs arise, please place a TOC consult.  Reinaldo Raddle, RN, BSN  Trauma/Neuro ICU Case Manager [phone]

## 2021-09-20 NOTE — Progress Notes (Signed)
Patient ID: Mark [name], male   DOB: 29-Jul-1956, 66 y.o.   MRN: [id number] BP 103/68    Pulse 72    Temp 99.2 F (37.3 C) (Oral)    Resp 10    Ht 6' (1.829 m)    Wt 54.9 kg    SpO2 100%    BMI 16.41 kg/m  Alert and oriented x 4 Speech is clear and fluent Moving all extremities well Wound is clean, dry, and without signs of infection
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
## 2021-09-20 NOTE — Discharge Instructions (Addendum)
Orthopaedic Trauma Service Discharge Instructions   General Discharge Instructions  WEIGHT BEARING STATUS: Weightbearing as tolerated right lower extremity  RANGE OF MOTION/ACTIVITY: Ok for hip and knee motion as tolerated  Wound Care: Incisions can be left open to air if there is no drainage. If incision continues to have drainage, follow wound care instructions below. Okay to shower if no drainage from incisions.  DVT/PE prophylaxis: Eliquis x 30 days  Diet: as you were eating previously.  Can use over the counter stool softeners and bowel preparations, such as Miralax, to help with bowel movements.  Narcotics can be constipating.  Be sure to drink plenty of fluids  PAIN MEDICATION USE AND EXPECTATIONS  You have likely been given narcotic medications to help control your pain.  After a traumatic event that results in an fracture (broken bone) with or without surgery, it is ok to use narcotic pain medications to help control one's pain.  We understand that everyone responds to pain differently and each individual patient will be evaluated on a regular basis for the continued need for narcotic medications. Ideally, narcotic medication use should last no more than 6-8 weeks (coinciding with fracture healing).   As a patient it is your responsibility as well to monitor narcotic medication use and report the amount and frequency you use these medications when you come to your office visit.   We would also advise that if you are using narcotic medications, you should take a dose prior to therapy to maximize you participation.  IF YOU ARE ON NARCOTIC MEDICATIONS IT IS NOT PERMISSIBLE TO OPERATE A MOTOR VEHICLE (MOTORCYCLE/CAR/TRUCK/MOPED) OR HEAVY MACHINERY DO NOT MIX NARCOTICS WITH OTHER CNS (CENTRAL NERVOUS SYSTEM) DEPRESSANTS SUCH AS ALCOHOL   STOP SMOKING OR USING NICOTINE PRODUCTS!!!!  As discussed nicotine severely impairs your body's ability to heal surgical and traumatic wounds but  also impairs bone healing.  Wounds and bone heal by forming microscopic blood vessels (angiogenesis) and nicotine is a vasoconstrictor (essentially, shrinks blood vessels).  Therefore, if vasoconstriction occurs to these microscopic blood vessels they essentially disappear and are unable to deliver necessary nutrients to the healing tissue.  This is one modifiable factor that you can do to dramatically increase your chances of healing your injury.    (This means no smoking, no nicotine gum, patches, etc)  DO NOT USE NONSTEROIDAL ANTI-INFLAMMATORY DRUGS (NSAID'S)  Using products such as Advil (ibuprofen), Aleve (naproxen), Motrin (ibuprofen) for additional pain control during fracture healing can delay and/or prevent the healing response.  If you would like to take over the counter (OTC) medication, Tylenol (acetaminophen) is ok.  However, some narcotic medications that are given for pain control contain acetaminophen as well. Therefore, you should not exceed more than 4000 mg of tylenol in a day if you do not have liver disease.  Also note that there are may OTC medicines, such as cold medicines and allergy medicines that my contain tylenol as well.  If you have any questions about medications and/or interactions please ask your doctor/PA or your pharmacist.      ICE AND ELEVATE INJURED/OPERATIVE EXTREMITY  Using ice and elevating the injured extremity above your heart can help with swelling and pain control.  Icing in a pulsatile fashion, such as 20 minutes on and 20 minutes off, can be followed.    Do not place ice directly on skin. Make sure there is a barrier between to skin and the ice pack.    Using frozen items such as  frozen peas works well as the conform nicely to the are that needs to be iced.  USE AN ACE WRAP OR TED HOSE FOR SWELLING CONTROL  In addition to icing and elevation, Ace wraps or TED hose are used to help limit and resolve swelling.  It is recommended to use Ace wraps or TED hose  until you are informed to stop.    When using Ace Wraps start the wrapping distally (farthest away from the body) and wrap proximally (closer to the body)   Example: If you had surgery on your leg or thing and you do not have a splint on, start the ace wrap at the toes and work your way up to the thigh        If you had surgery on your upper extremity and do not have a splint on, start the ace wrap at your fingers and work your way up to the upper arm    [place]: [phone]   VISIT OUR WEBSITE FOR ADDITIONAL INFORMATION: [email or web address]   Discharge Wound Care Instructions  Do NOT apply any ointments, solutions or lotions to pin sites or surgical wounds.  These prevent needed drainage and even though solutions like hydrogen peroxide kill bacteria, they also damage cells lining the pin sites that help fight infection.  Applying lotions or ointments can keep the wounds moist and can cause them to breakdown and open up as well. This can increase the risk for infection. When in doubt call the office.  If any drainage is noted, use foam dressing   Once the incision is completely dry and without drainage, it may be left open to air out.  Showering may begin 36-48 hours later.  Cleaning gently with soap and water.    Inpatient Rehab Discharge Instructions  Mark [name] Discharge date and time: 10/06/2021  1:17 PM   Activities/Precautions/ Functional Status: Activity: As tolerated Diet:  Wound Care: Routine skin checks Functional status:  ___ No restrictions     ___ Walk up steps independently ___ 24/7 supervision/assistance   ___ Walk up steps with assistance ___ Intermittent supervision/assistance  ___ Bathe/dress independently ___ Walk with walker     _x__ Bathe/dress with assistance ___ Walk Independently    ___ Shower independently ___ Walk with assistance    ___ Shower with assistance ___ No alcohol     ___ Return to work/school  ________  Special Instructions: No driving smoking or alcohol   My questions have been answered and I understand these instructions. I will adhere to these goals and the provided educational materials after my discharge from the hospital.  Patient/Caregiver Signature _______________________________ Date __________  Clinician Signature _______________________________________ Date __________  Please bring this form and your medication list with you to all your follow-up doctor's appointments.

## 2021-09-21 ENCOUNTER — Inpatient Hospital Stay (HOSPITAL_COMMUNITY): Payer: Medicare Other

## 2021-09-21 LAB — BASIC METABOLIC PANEL
Anion gap: 8 (ref 5–15)
BUN: 9 mg/dL (ref 8–23)
CO2: 29 mmol/L (ref 22–32)
Calcium: 7.9 mg/dL — ABNORMAL LOW (ref 8.9–10.3)
Chloride: 97 mmol/L — ABNORMAL LOW (ref 98–111)
Creatinine, Ser: 0.56 mg/dL — ABNORMAL LOW (ref 0.61–1.24)
GFR, Estimated: >60 mL/min (ref >60)
Glucose, Bld: 157 mg/dL — ABNORMAL HIGH (ref 70–99)
Potassium: 3.9 mmol/L (ref 3.5–5.1)
Sodium: 134 mmol/L — ABNORMAL LOW (ref 135–145)

## 2021-09-21 LAB — HEMOGLOBIN AND HEMATOCRIT, BLOOD
HCT: 24.7 % — ABNORMAL LOW (ref 39.0–52.0)
Hemoglobin: 8.2 g/dL — ABNORMAL LOW (ref 13.0–17.0)

## 2021-09-21 LAB — CBC
HCT: 23.9 % — ABNORMAL LOW (ref 39.0–52.0)
Hemoglobin: 7.6 g/dL — ABNORMAL LOW (ref 13.0–17.0)
MCH: 29.6 pg (ref 26.0–34.0)
MCHC: 31.8 g/dL (ref 30.0–36.0)
MCV: 93 fL (ref 80.0–100.0)
Platelets: 99 10*3/uL — ABNORMAL LOW (ref 150–400)
RBC: 2.57 MIL/uL — ABNORMAL LOW (ref 4.22–5.81)
RDW: 14.3 % (ref 11.5–15.5)
WBC: 9 10*3/uL (ref 4.0–10.5)
nRBC: 0 % (ref 0.0–0.2)

## 2021-09-21 LAB — PREPARE RBC (CROSSMATCH)

## 2021-09-21 MED ORDER — SODIUM CHLORIDE 0.9 % IV SOLN: 500.0000 mL | Freq: Once | INTRAVENOUS | Status: DC

## 2021-09-21 MED ORDER — SODIUM CHLORIDE 0.9 % IV BOLUS
500.0000 mL | Freq: Once | INTRAVENOUS | Status: AC
  Administered 2021-09-21: 500 mL via INTRAVENOUS

## 2021-09-21 MED ORDER — ALBUMIN HUMAN 5 % IV SOLN
12.5000 g | Freq: Once | INTRAVENOUS | Status: AC
  Administered 2021-09-21: 12.5 g via INTRAVENOUS
  Filled 2021-09-21: qty 250

## 2021-09-21 MED ORDER — SODIUM CHLORIDE 0.9 % IV SOLN: INTRAVENOUS | Status: DC

## 2021-09-21 MED ORDER — GUAIFENESIN ER 600 MG PO TB12
600.0000 mg | ORAL_TABLET | Freq: Two times a day (BID) | ORAL | Status: DC
  Administered 2021-09-21 – 2021-09-22 (×3): 600 mg via ORAL
  Filled 2021-09-21 (×5): qty 1

## 2021-09-21 NOTE — Progress Notes (Signed)
°   09/21/21 2010  Vitals  BP (!) 87/58  MAP (mmHg) 65  Pulse Rate (!) 108  ECG Heart Rate (!) 107  Resp 18  Level of Consciousness  Level of Consciousness Alert  MEWS COLOR  MEWS Score Color Yellow  Oxygen Therapy  SpO2 95 %  O2 Device Nasal Cannula  O2 Flow Rate (L/min) 5 L/min  MEWS Score  MEWS Temp 0  MEWS Systolic 1  MEWS Pulse 1  MEWS RR 0  MEWS LOC 0  MEWS Score 2  Provider Notification  Provider Name/Title Greer Pickerel MD  Date Provider Notified 09/21/21  Time Provider Notified 2010  Notification Type Page  Notification Reason Change in status  Provider response See new orders  Date of Provider Response 09/21/21  Time of Provider Response 2010
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
## 2021-09-21 NOTE — Progress Notes (Signed)
Overall stable.  No new issues or problems.  Pain well controlled.  Motor and sensory function stable.  Overall progressing well following anterior cervical decompression and fusion surgery.  Continue efforts at mobilization.
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
## 2021-09-21 NOTE — Progress Notes (Signed)
0403 Pt was given Oxycodone 15MG  and BP was 136/61 at the time.   0415 Pt desated to 85% on 5L [place]. Pt was given Albuterol breathing treatment and saturation is 95%  0515 Greer Pickerel MD was notified when pt was being oral suctioned we found a small portion appeared to Tylenol. BP was in the 33'V systolic. 500 NS bolus ordered and Portable chest xray ordered  0551 BP 94/52 MAP 64
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
## 2021-09-21 NOTE — Progress Notes (Signed)
This RN removed pulse ox from patient's forehead reading 100% SPO2 and placed it on a finger per policy for this type of pulse ox. Patient's reading was 80% on 5L [place]. RN increased 02 to 10L [place] and added humidity. RT paged for assessment and treatment.   Patient in no distress. RR is 15. Cough is congested.

## 2021-09-22 ENCOUNTER — Inpatient Hospital Stay (HOSPITAL_COMMUNITY): Payer: Medicare Other

## 2021-09-22 LAB — POCT I-STAT 7, (LYTES, BLD GAS, ICA,H+H)
Acid-Base Excess: 0 mmol/L (ref 0.0–2.0)
Acid-Base Excess: 0 mmol/L (ref 0.0–2.0)
Bicarbonate: 26.3 mmol/L (ref 20.0–28.0)
Bicarbonate: 25.3 mmol/L (ref 20.0–28.0)
Calcium, Ion: 1.14 mmol/L — ABNORMAL LOW (ref 1.15–1.40)
Calcium, Ion: 1.14 mmol/L — ABNORMAL LOW (ref 1.15–1.40)
HCT: 23 % — ABNORMAL LOW (ref 39.0–52.0)
HCT: 23 % — ABNORMAL LOW (ref 39.0–52.0)
Hemoglobin: 7.8 g/dL — ABNORMAL LOW (ref 13.0–17.0)
Hemoglobin: 7.8 g/dL — ABNORMAL LOW (ref 13.0–17.0)
O2 Saturation: 96 %
O2 Saturation: 89 %
Patient temperature: 99.1
Potassium: 3.5 mmol/L (ref 3.5–5.1)
Potassium: 3.6 mmol/L (ref 3.5–5.1)
Sodium: 137 mmol/L (ref 135–145)
Sodium: 138 mmol/L (ref 135–145)
TCO2: 28 mmol/L (ref 22–32)
TCO2: 27 mmol/L (ref 22–32)
pCO2 arterial: 47.4 mmHg (ref 32.0–48.0)
pCO2 arterial: 44.2 mmHg (ref 32.0–48.0)
pH, Arterial: 7.351 (ref 7.350–7.450)
pH, Arterial: 7.367 (ref 7.350–7.450)
pO2, Arterial: 85 mmHg (ref 83.0–108.0)
pO2, Arterial: 59 mmHg — ABNORMAL LOW (ref 83.0–108.0)

## 2021-09-22 LAB — CBC
HCT: 23.7 % — ABNORMAL LOW (ref 39.0–52.0)
HCT: 25 % — ABNORMAL LOW (ref 39.0–52.0)
Hemoglobin: 7.7 g/dL — ABNORMAL LOW (ref 13.0–17.0)
Hemoglobin: 8.1 g/dL — ABNORMAL LOW (ref 13.0–17.0)
MCH: 29.4 pg (ref 26.0–34.0)
MCH: 29.3 pg (ref 26.0–34.0)
MCHC: 32.5 g/dL (ref 30.0–36.0)
MCHC: 32.4 g/dL (ref 30.0–36.0)
MCV: 90.5 fL (ref 80.0–100.0)
MCV: 90.6 fL (ref 80.0–100.0)
Platelets: 76 10*3/uL — ABNORMAL LOW (ref 150–400)
Platelets: 83 10*3/uL — ABNORMAL LOW (ref 150–400)
RBC: 2.62 MIL/uL — ABNORMAL LOW (ref 4.22–5.81)
RBC: 2.76 MIL/uL — ABNORMAL LOW (ref 4.22–5.81)
RDW: 14.8 % (ref 11.5–15.5)
RDW: 14.7 % (ref 11.5–15.5)
WBC: 2.3 10*3/uL — ABNORMAL LOW (ref 4.0–10.5)
WBC: 4.9 10*3/uL (ref 4.0–10.5)
nRBC: 0 % (ref 0.0–0.2)
nRBC: 0 % (ref 0.0–0.2)

## 2021-09-22 LAB — BASIC METABOLIC PANEL
Anion gap: 7 (ref 5–15)
Anion gap: 8 (ref 5–15)
BUN: 16 mg/dL (ref 8–23)
BUN: 13 mg/dL (ref 8–23)
CO2: 28 mmol/L (ref 22–32)
CO2: 26 mmol/L (ref 22–32)
Calcium: 7.8 mg/dL — ABNORMAL LOW (ref 8.9–10.3)
Calcium: 8 mg/dL — ABNORMAL LOW (ref 8.9–10.3)
Chloride: 101 mmol/L (ref 98–111)
Chloride: 101 mmol/L (ref 98–111)
Creatinine, Ser: 0.62 mg/dL (ref 0.61–1.24)
Creatinine, Ser: 0.65 mg/dL (ref 0.61–1.24)
GFR, Estimated: >60 mL/min (ref >60)
GFR, Estimated: >60 mL/min (ref >60)
Glucose, Bld: 84 mg/dL (ref 70–99)
Glucose, Bld: 117 mg/dL — ABNORMAL HIGH (ref 70–99)
Potassium: 3.6 mmol/L (ref 3.5–5.1)
Potassium: 3.6 mmol/L (ref 3.5–5.1)
Sodium: 136 mmol/L (ref 135–145)
Sodium: 135 mmol/L (ref 135–145)

## 2021-09-22 LAB — MAGNESIUM: Magnesium: 1.4 mg/dL — ABNORMAL LOW (ref 1.7–2.4)

## 2021-09-22 MED ORDER — AMIODARONE IV BOLUS ONLY 150 MG/100ML
150.0000 mg | Freq: Once | INTRAVENOUS | Status: AC
  Administered 2021-09-22: 150 mg via INTRAVENOUS
  Filled 2021-09-22: qty 100

## 2021-09-22 MED ORDER — METOPROLOL TARTRATE 5 MG/5ML IV SOLN
5.0000 mg | Freq: Once | INTRAVENOUS | Status: AC
  Administered 2021-09-22: 5 mg via INTRAVENOUS
  Filled 2021-09-22: qty 5

## 2021-09-22 MED ORDER — AMIODARONE IV BOLUS ONLY 150 MG/100ML
150.0000 mg | Freq: Once | INTRAVENOUS | Status: DC
  Filled 2021-09-22: qty 100

## 2021-09-22 MED ORDER — AMIODARONE HCL IN DEXTROSE 360-4.14 MG/200ML-% IV SOLN
60.0000 mg/h | INTRAVENOUS | Status: DC
  Administered 2021-09-22 (×2): 60 mg/h via INTRAVENOUS
  Filled 2021-09-22: qty 200

## 2021-09-22 MED ORDER — SODIUM CHLORIDE 0.9 % IV SOLN
250.0000 mL | INTRAVENOUS | Status: DC
  Administered 2021-09-29: 250 mL via INTRAVENOUS

## 2021-09-22 MED ORDER — SODIUM CHLORIDE 0.9 % IV SOLN
2.0000 g | Freq: Three times a day (TID) | INTRAVENOUS | Status: AC
  Administered 2021-09-22 – 2021-09-28 (×20): 2 g via INTRAVENOUS
  Filled 2021-09-22 (×22): qty 2

## 2021-09-22 MED ORDER — PHENYLEPHRINE HCL-NACL 20-0.9 MG/250ML-% IV SOLN
0.0000 ug/min | INTRAVENOUS | Status: DC
  Administered 2021-09-22: 40 ug/min via INTRAVENOUS
  Filled 2021-09-22: qty 250

## 2021-09-22 MED ORDER — AMIODARONE HCL IN DEXTROSE 360-4.14 MG/200ML-% IV SOLN
30.0000 mg/h | INTRAVENOUS | Status: DC
  Administered 2021-09-23: 30 mg/h via INTRAVENOUS
  Filled 2021-09-22: qty 200

## 2021-09-22 MED ORDER — ORAL CARE MOUTH RINSE
15.0000 mL | Freq: Two times a day (BID) | OROMUCOSAL | Status: DC
  Administered 2021-09-22 – 2021-09-30 (×17): 15 mL via OROMUCOSAL

## 2021-09-22 MED ORDER — PHENYLEPHRINE HCL-NACL 20-0.9 MG/250ML-% IV SOLN
25.0000 ug/min | INTRAVENOUS | Status: DC
  Administered 2021-09-22: 40 ug/min via INTRAVENOUS

## 2021-09-22 MED ORDER — MAGNESIUM SULFATE 2 GM/50ML IV SOLN
2.0000 g | Freq: Once | INTRAVENOUS | Status: AC
  Administered 2021-09-22: 2 g via INTRAVENOUS
  Filled 2021-09-22: qty 50
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
## 2021-09-22 NOTE — Progress Notes (Signed)
Patient continues to do well following anterior cervical decompression and fusion.  No complaints of numbness paresthesias or weakness.  Wound healing well.  Overall progressing well.  Continue efforts at mobilization.  No new recommendations from our standpoint.

## 2021-09-22 NOTE — Progress Notes (Signed)
This afternoon the patient became acutely tachycardic to the 170s with an irregular rhythm. EKG confirmed atrial fibrillation with RVR.Patient was hypotensive with systolic blood pressure in the 60s at the time. On my exam at the bedside he was alert and mentating appropriately. Reported general malaise but no dizziness or lightheadedness. Of note his baseline blood pressure is low, often in the 80s, for which he is on midodrine. <BR >Because he was alert and oriented, cardioversion was deferred. Transferred to 4N ICU.Fluid bolus given for BP. Amiodarone bolus given followed by infusion with improvement in rate control and blood pressure. 5mg  IV metoprolol then given with subsequent hypotension. Patient remained alert and oriented throughout and denied symptoms of chest pain or dizziness, so cardioversion was again deferred. Labs unremarkable stable hemoglobin. Pati ent did have increasing oxygen requirement immediately prior to this event. CXR shows worsening bilateral patchy infiltrates consistent with pneumonia. Patient is already on antibiotics for HCAP. Hypoxia most likely precipitated his new onset a fib.  Rate control has improved on amiodarone infusion, with heart rate 110s to 120s and normal BP. Remain NPO for now given concerns for aspiration. Stop IV fluids. Plan to give amio bolus  prn if tachycardia worsens again.

## 2021-09-22 NOTE — Progress Notes (Signed)
Trauma Event Note    Reason for Call :  Hypotensive, Afib RVR in 180s, SpO2 77% on [place]  Initial Focused Assessment:  Patient A&Ox4, complaining of some shortness of breath and feeling light headed Breath sounds equal  Interventions:  1L NS bolus Amiodarone bolus/drip Metoprolol 5mg  for HR correction, Neo drip for BP  Plan of Care:  Bed available on 4NICU, transported to 4N19 with assistance from ICU staff  Event Summary:  Patient tolerating Amiodarone, HR corrrected to 110s-120s BP responding well to Neo drip Patient remained A&Ox4, pleasant during entire event Family updated by myself and Dr Zenia Resides via phone  MD Notified: Dr Zenia Resides called me to bedside at 1618 Handoff given to ICU RN Elmyra Ricks  Last imported Vital Signs BP 108/74    Pulse (!) 112    Temp 97.8 F (36.6 C) (Oral)    Resp (!) 22    Ht 6' (1.829 m)    Wt 54.9 kg    SpO2 98%    BMI 16.41 kg/m   Trending CBC Recent Labs    09/21/21 0345 09/21/21 1853 09/22/21 0437 09/22/21 1640 09/22/21 1646  WBC 9.0  --  2.3* 4.9  --   HGB 7.6*   < > 7.7* 8.1* 7.8*  HCT 23.9*   < > 23.7* 25.0* 23.0*  PLT 99*  --  76* 83*  --    < > = values in this interval not displayed.    Trending Coag's No results for input(s): APTT, INR in the last 72 hours.  Trending BMET Recent Labs    09/21/21 0345 09/22/21 0437 09/22/21 1640 09/22/21 1646  NA 134* 136 135 137  K 3.9 3.6 3.6 3.5  CL 97* 101 101  --   CO2 29 28 26   --   BUN 9 16 13   --   CREATININE 0.56* 0.62 0.65  --   GLUCOSE 157* 84 117*  --       Estill Bamberg L Lucas Winograd  Trauma Response RN  Please call TRN at [phone] for further assistance.
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
## 2021-09-22 NOTE — Progress Notes (Signed)
Amiodarone Drug - Drug Interaction Consult Note  Recommendations: Patient is on Ability and venlafaxine which can both mildly prolong QTc. Continue amiodarone infusion as ordered. Consider EKG check in AM  Amiodarone is metabolized by the cytochrome P450 system and therefore has the potential to cause many drug interactions. Amiodarone has an average plasma half-life of 50 days (range 20 to 100 days).   There is potential for drug interactions to occur several weeks or months after stopping treatment and the onset of drug interactions may be slow after initiating amiodarone.   [x]  Statins: Increased risk of myopathy. Simvastatin- restrict dose to 20mg  daily. Other statins: counsel patients to report any muscle pain or weakness immediately.  []  Anticoagulants: Amiodarone can increase anticoagulant effect. Consider warfarin dose reduction. Patients should be monitored closely and the dose of anticoagulant altered accordingly, remembering that amiodarone levels take several weeks to stabilize.  []  Antiepileptics: Amiodarone can increase plasma concentration of phenytoin, the dose should be reduced. Note that small changes in phenytoin dose can result in large changes in levels. Monitor patient and counsel on signs of toxicity.  []  Beta blockers: increased risk of bradycardia, AV block and myocardial depression. Sotalol - avoid concomitant use.  []   Calcium channel blockers (diltiazem and verapamil): increased risk of bradycardia, AV block and myocardial depression.  []   Cyclosporine: Amiodarone increases levels of cyclosporine. Reduced dose of cyclosporine is recommended.  []  Digoxin dose should be halved when amiodarone is started.  []  Diuretics: increased risk of cardiotoxicity if hypokalemia occurs.  []  Oral hypoglycemic agents (glyburide, glipizide, glimepiride): increased risk of hypoglycemia. Patient's glucose levels should be monitored closely when initiating amiodarone therapy.    [x]  Drugs that prolong the QT interval:  Torsades de pointes risk may be increased with concurrent use - avoid if possible.  Monitor QTc, also keep magnesium/potassium WNL if concurrent therapy can't be avoided.  -Venlafaxine and ability    Antibiotics: e.g. fluoroquinolones, erythromycin.  Antiarrhythmics: e.g. quinidine, procainamide, disopyramide, sotalol.  Antipsychotics: e.g. phenothiazines, haloperidol.   Lithium, tricyclic antidepressants, and methadone.  Albertina Parr, PharmD., BCCCP Clinical Pharmacist Please refer to [facility] for unit-specific pharmacist
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
## 2021-09-22 NOTE — Progress Notes (Signed)
Patient still hypotensive after albumin infusion, he is alert and oriented x4, complains of pain in the right leg.  Blood pressure from arterial line continues to be higher than cuff pressures as documented in flowsheet. No change in patient mentation.  Patient is holding 4108mL of urine in bladder. He requests extra time to void, he prefers not to have intermittent catheter.  Discussed with Greer Pickerel, MD Trauma.
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
## 2021-09-23 ENCOUNTER — Inpatient Hospital Stay (HOSPITAL_COMMUNITY): Payer: Medicare Other

## 2021-09-23 DIAGNOSIS — S065XAA Traumatic subdural hemorrhage with loss of consciousness status unknown, initial encounter: Secondary | ICD-10-CM

## 2021-09-23 DIAGNOSIS — I480 Paroxysmal atrial fibrillation: Secondary | ICD-10-CM | POA: Diagnosis not present

## 2021-09-23 DIAGNOSIS — E43 Unspecified severe protein-calorie malnutrition: Secondary | ICD-10-CM | POA: Insufficient documentation

## 2021-09-23 DIAGNOSIS — S72141A Displaced intertrochanteric fracture of right femur, initial encounter for closed fracture: Secondary | ICD-10-CM

## 2021-09-23 LAB — CULTURE, RESPIRATORY W GRAM STAIN

## 2021-09-23 LAB — CBC
HCT: 24.4 % — ABNORMAL LOW (ref 39.0–52.0)
Hemoglobin: 8 g/dL — ABNORMAL LOW (ref 13.0–17.0)
MCH: 29.7 pg (ref 26.0–34.0)
MCHC: 32.8 g/dL (ref 30.0–36.0)
MCV: 90.7 fL (ref 80.0–100.0)
Platelets: 103 10*3/uL — ABNORMAL LOW (ref 150–400)
RBC: 2.69 MIL/uL — ABNORMAL LOW (ref 4.22–5.81)
RDW: 14.7 % (ref 11.5–15.5)
WBC: 7.8 10*3/uL (ref 4.0–10.5)
nRBC: 0 % (ref 0.0–0.2)

## 2021-09-23 LAB — BPAM RBC
Blood Product Expiration Date: 202302252359
Blood Product Expiration Date: 202303032359
ISSUE DATE / TIME: 202302041238
Unit Type and Rh: 5100
Unit Type and Rh: 5100

## 2021-09-23 LAB — BASIC METABOLIC PANEL
Anion gap: 8 (ref 5–15)
BUN: 15 mg/dL (ref 8–23)
CO2: 26 mmol/L (ref 22–32)
Calcium: 8 mg/dL — ABNORMAL LOW (ref 8.9–10.3)
Chloride: 103 mmol/L (ref 98–111)
Creatinine, Ser: 0.55 mg/dL — ABNORMAL LOW (ref 0.61–1.24)
GFR, Estimated: >60 mL/min (ref >60)
Glucose, Bld: 101 mg/dL — ABNORMAL HIGH (ref 70–99)
Potassium: 3.5 mmol/L (ref 3.5–5.1)
Sodium: 137 mmol/L (ref 135–145)

## 2021-09-23 LAB — TYPE AND SCREEN
ABO/RH(D): O POS
Antibody Screen: NEGATIVE
Unit division: 0
Unit division: 0

## 2021-09-23 LAB — PHOSPHORUS: Phosphorus: 1.4 mg/dL — ABNORMAL LOW (ref 2.5–4.6)

## 2021-09-23 LAB — MAGNESIUM: Magnesium: 2.3 mg/dL (ref 1.7–2.4)

## 2021-09-23 MED ORDER — DOCUSATE SODIUM 50 MG/5ML PO LIQD
100.0000 mg | Freq: Two times a day (BID) | ORAL | Status: DC
  Administered 2021-09-23 – 2021-10-05 (×24): 100 mg
  Filled 2021-09-23 (×24): qty 10

## 2021-09-23 MED ORDER — MAGNESIUM SULFATE 2 GM/50ML IV SOLN
2.0000 g | Freq: Once | INTRAVENOUS | Status: AC
  Administered 2021-09-23: 2 g via INTRAVENOUS
  Filled 2021-09-23: qty 50

## 2021-09-23 MED ORDER — VENLAFAXINE HCL 37.5 MG PO TABS
37.5000 mg | ORAL_TABLET | Freq: Two times a day (BID) | ORAL | Status: DC
  Administered 2021-09-23 – 2021-10-06 (×26): 37.5 mg
  Filled 2021-09-23 (×27): qty 1

## 2021-09-23 MED ORDER — AMIODARONE HCL 200 MG PO TABS: 200.0000 mg | ORAL_TABLET | Freq: Two times a day (BID) | ORAL | Status: DC

## 2021-09-23 MED ORDER — TRAZODONE HCL 50 MG PO TABS
150.0000 mg | ORAL_TABLET | Freq: Every evening | ORAL | Status: DC
  Administered 2021-09-23 – 2021-09-30 (×8): 150 mg
  Filled 2021-09-23 (×8): qty 3

## 2021-09-23 MED ORDER — CARVEDILOL 3.125 MG PO TABS
3.1250 mg | ORAL_TABLET | Freq: Two times a day (BID) | ORAL | Status: DC
  Filled 2021-09-23: qty 1

## 2021-09-23 MED ORDER — ACETAMINOPHEN 500 MG PO TABS
1000.0000 mg | ORAL_TABLET | Freq: Four times a day (QID) | ORAL | Status: DC
  Administered 2021-09-23 – 2021-10-06 (×46): 1000 mg
  Filled 2021-09-23 (×50): qty 2

## 2021-09-23 MED ORDER — OXYCODONE HCL 5 MG PO TABS
15.0000 mg | ORAL_TABLET | ORAL | Status: DC | PRN
  Administered 2021-09-23 – 2021-10-04 (×38): 15 mg
  Filled 2021-09-23 (×40): qty 3

## 2021-09-23 MED ORDER — LORATADINE 10 MG PO TABS
10.0000 mg | ORAL_TABLET | Freq: Every morning | ORAL | Status: DC
  Administered 2021-09-24 – 2021-10-06 (×10): 10 mg
  Filled 2021-09-23 (×9): qty 1

## 2021-09-23 MED ORDER — CLONAZEPAM 0.5 MG PO TABS
0.5000 mg | ORAL_TABLET | Freq: Every evening | ORAL | Status: DC
  Administered 2021-09-23 – 2021-10-05 (×13): 0.5 mg
  Filled 2021-09-23 (×13): qty 1

## 2021-09-23 MED ORDER — AMIODARONE HCL 200 MG PO TABS
200.0000 mg | ORAL_TABLET | Freq: Two times a day (BID) | ORAL | Status: DC
  Administered 2021-09-23 – 2021-09-27 (×8): 200 mg via NASOGASTRIC
  Filled 2021-09-23 (×8): qty 1

## 2021-09-23 MED ORDER — PRAVASTATIN SODIUM 10 MG PO TABS
20.0000 mg | ORAL_TABLET | Freq: Every evening | ORAL | Status: DC
  Administered 2021-09-23 – 2021-10-05 (×13): 20 mg
  Filled 2021-09-23 (×13): qty 2

## 2021-09-23 MED ORDER — ARIPIPRAZOLE 2 MG PO TABS
2.0000 mg | ORAL_TABLET | Freq: Every morning | ORAL | Status: DC
  Administered 2021-09-24 – 2021-10-06 (×13): 2 mg
  Filled 2021-09-23 (×13): qty 1

## 2021-09-23 MED ORDER — OSMOLITE 1.5 CAL PO LIQD
1000.0000 mL | ORAL | Status: DC
  Administered 2021-09-23: 1000 mL

## 2021-09-23 MED ORDER — ENOXAPARIN SODIUM 30 MG/0.3ML IJ SOSY
30.0000 mg | PREFILLED_SYRINGE | Freq: Two times a day (BID) | INTRAMUSCULAR | Status: DC
  Administered 2021-09-23 – 2021-10-06 (×26): 30 mg via SUBCUTANEOUS
  Filled 2021-09-23 (×26): qty 0.3

## 2021-09-23 MED ORDER — CARVEDILOL 3.125 MG PO TABS
3.1250 mg | ORAL_TABLET | Freq: Two times a day (BID) | ORAL | Status: DC
  Administered 2021-09-23 – 2021-10-04 (×23): 3.125 mg
  Filled 2021-09-23 (×23): qty 1

## 2021-09-23 MED ORDER — POLYETHYLENE GLYCOL 3350 17 G PO PACK
17.0000 g | PACK | Freq: Every day | ORAL | Status: DC | PRN
  Administered 2021-09-25 – 2021-09-29 (×3): 17 g
  Filled 2021-09-23 (×3): qty 1

## 2021-09-23 MED ORDER — MIDODRINE HCL 5 MG PO TABS: 5.0000 mg | ORAL_TABLET | Freq: Three times a day (TID) | ORAL | Status: DC

## 2021-09-23 MED ORDER — IPRATROPIUM-ALBUTEROL 0.5-2.5 (3) MG/3ML IN SOLN
3.0000 mL | Freq: Three times a day (TID) | RESPIRATORY_TRACT | Status: DC
  Administered 2021-09-23 – 2021-09-27 (×13): 3 mL via RESPIRATORY_TRACT
  Filled 2021-09-23 (×13): qty 3

## 2021-09-23 MED ORDER — GUAIFENESIN 100 MG/5ML PO LIQD
10.0000 mL | Freq: Four times a day (QID) | ORAL | Status: DC
  Administered 2021-09-23 – 2021-10-06 (×51): 10 mL
  Filled 2021-09-23: qty 15
  Filled 2021-09-23: qty 10
  Filled 2021-09-23 (×4): qty 15
  Filled 2021-09-23: qty 10
  Filled 2021-09-23 (×2): qty 15
  Filled 2021-09-23: qty 10
  Filled 2021-09-23 (×5): qty 15
  Filled 2021-09-23 (×5): qty 10
  Filled 2021-09-23 (×6): qty 15
  Filled 2021-09-23 (×3): qty 10
  Filled 2021-09-23 (×6): qty 15
  Filled 2021-09-23 (×2): qty 10
  Filled 2021-09-23 (×2): qty 15
  Filled 2021-09-23: qty 10
  Filled 2021-09-23 (×2): qty 15
  Filled 2021-09-23: qty 10
  Filled 2021-09-23 (×4): qty 15
  Filled 2021-09-23: qty 10
  Filled 2021-09-23 (×3): qty 15
  Filled 2021-09-23 (×2): qty 10

## 2021-09-23 MED ORDER — PROSOURCE TF PO LIQD
45.0000 mL | Freq: Two times a day (BID) | ORAL | Status: DC
  Administered 2021-09-23 – 2021-09-26 (×6): 45 mL
  Filled 2021-09-23 (×6): qty 45

## 2021-09-23 MED ORDER — OXYCODONE HCL 5 MG PO TABS
10.0000 mg | ORAL_TABLET | ORAL | Status: DC | PRN
  Administered 2021-09-25 – 2021-10-05 (×17): 10 mg
  Filled 2021-09-23 (×20): qty 2
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
## 2021-09-23 NOTE — Progress Notes (Signed)
Modified Barium Swallow Progress Note  Patient Details  Name: Mark [name] MRN: [id number] Date of Birth: Jul 16, 1956  Today's Date: 09/23/2021  Modified Barium Swallow completed.  Full report located under Chart Review in the Imaging Section.  Brief recommendations include the following:  Clinical Impression  Modified barium swallow study was performed. Pt was seated upright, alert, and pleasant throughout the swallow study. Patient has medical history of tonsillar cancer s/p surgery, radiation and chemo, esophageal stenosis with recurrent esophageal dilations. Anatomically, pt had mild edema s/p ACDF surgery, which may have impacted results of instrumental swallow study. Decreased UES reaction noted during MBS. Observed esophagus to be ~1/2 full with retrograde movement. To evaluate the pt.'s swallowing function and safety, he was administered NTL, HTL, and puree textures. In regards to the oral phase of swallowing, the pt had no oral spillage, adequate oral clearance of novel boluses, and adequate BOT retraction.  Noted across textures that the pt has a mildly reduced hyolaryngeal anterior movement causing incomplete epiglottic inversion and residue in the vallecula. Moderate residue in the vallecula and pyriform sinuses noted after all textures. NTL rated 7 on PAS. Honey thick liquids rated 6 on PAS. Across all consistencies, only small amounts would go down into esophagus at a time. Pt would benefit from alternative means of nutrition (cortrak) at this time.  In next MBS, SLP to trial having patient reclined in order to facilitate UES opening.   Swallow Evaluation Recommendations   Recommended Consults: Consider esophageal assessment   SLP Diet Recommendations: NPO;Alternative means - temporary       Medication Administration: Via alternative means               Oral Care Recommendations: Oral care BID   Other Recommendations: Have oral suction available    Houston Siren 09/23/2021,1:44 PM

## 2021-09-23 NOTE — Progress Notes (Signed)
Patient ID: Mark [name], male   DOB: 1955-09-05, 66 y.o.   MRN: [id number] BP 113/63    Pulse 88    Temp 98.4 F (36.9 C) (Oral)    Resp 13    Ht 6' (1.829 m)    Wt 54.9 kg    SpO2 100%    BMI 16.41 kg/m  Moving all extremities Wound is clean, dry, no signs of infection Cardiology consultation noted.  Post op ACDF C3/4.

## 2021-09-23 NOTE — Procedures (Signed)
Cortrak  Person Inserting Tube:  Maylon Peppers C, RD Tube Type:  Cortrak - 43 inches Tube Size:  10 Tube Location:  Left nare Initial Placement:  Stomach Secured by: Bridle Technique Used to Measure Tube Placement:  Marking at nare/corner of mouth Cortrak Secured At:  71 cm  Cortrak Tube Team Note:  Consult received to place a Cortrak feeding tube. Spoke with pt and Trauma physician. Will proceed with Cortrak today and possible PEG 2/7.   X-ray is required, abdominal x-ray has been ordered by the Cortrak team. Please confirm tube placement before using the Cortrak tube.   If the tube becomes dislodged please keep the tube and contact the Cortrak team at [email or web address] ([email or web address]) for replacement.  If after hours and replacement cannot be delayed, place a NG tube and confirm placement with an abdominal x-ray.    Lockie Pares., RD, LDN, CNSC See AMiON for contact information
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
## 2021-09-23 NOTE — Progress Notes (Signed)
TRN rounded on pt.  Pt was on 4NP yesterday and went into a-fib RVR - came to ICU and received amio bolus and gtt.  Neo also initiated due to hypotension.  HR improved today in the 100's.  On 8L HFNC.  MBS performed today without success.  Plan for cortrak today.  Pt has wife and sons for family support.
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
## 2021-09-24 ENCOUNTER — Inpatient Hospital Stay (HOSPITAL_COMMUNITY): Payer: Medicare Other

## 2021-09-24 ENCOUNTER — Encounter (HOSPITAL_COMMUNITY): Payer: Self-pay | Admitting: Certified Registered"

## 2021-09-24 ENCOUNTER — Encounter (HOSPITAL_COMMUNITY): Admission: EM | Disposition: A | Discharge status: Rehabilitation Facility | Payer: Self-pay | Source: Home / Self Care

## 2021-09-24 DIAGNOSIS — I4891 Unspecified atrial fibrillation: Secondary | ICD-10-CM | POA: Diagnosis not present

## 2021-09-24 DIAGNOSIS — S065XAA Traumatic subdural hemorrhage with loss of consciousness status unknown, initial encounter: Secondary | ICD-10-CM | POA: Diagnosis not present

## 2021-09-24 DIAGNOSIS — I480 Paroxysmal atrial fibrillation: Secondary | ICD-10-CM | POA: Diagnosis not present

## 2021-09-24 LAB — ECHOCARDIOGRAM COMPLETE
AR max vel: 3.65 cm2
AV Area VTI: 3.33 cm2
AV Area mean vel: 3.49 cm2
AV Mean grad: 3 mmHg
AV Peak grad: 5.3 mmHg
Ao pk vel: 1.15 m/s
Area-P 1/2: 4.8 cm2
Height: 72 in
MV VTI: 2.55 cm2
S' Lateral: 3 cm
Single Plane A4C EF: 67.5 %
Weight: 1936 oz

## 2021-09-24 LAB — PHOSPHORUS
Phosphorus: 1.8 mg/dL — ABNORMAL LOW (ref 2.5–4.6)
Phosphorus: 1.7 mg/dL — ABNORMAL LOW (ref 2.5–4.6)

## 2021-09-24 LAB — BASIC METABOLIC PANEL
Anion gap: 6 (ref 5–15)
BUN: 16 mg/dL (ref 8–23)
CO2: 32 mmol/L (ref 22–32)
Calcium: 8.1 mg/dL — ABNORMAL LOW (ref 8.9–10.3)
Chloride: 101 mmol/L (ref 98–111)
Creatinine, Ser: 0.57 mg/dL — ABNORMAL LOW (ref 0.61–1.24)
GFR, Estimated: >60 mL/min (ref >60)
Glucose, Bld: 125 mg/dL — ABNORMAL HIGH (ref 70–99)
Potassium: 3.1 mmol/L — ABNORMAL LOW (ref 3.5–5.1)
Sodium: 139 mmol/L (ref 135–145)

## 2021-09-24 LAB — MAGNESIUM
Magnesium: 1.8 mg/dL (ref 1.7–2.4)
Magnesium: 1.9 mg/dL (ref 1.7–2.4)

## 2021-09-24 LAB — TSH: TSH: 0.528 u[IU]/mL (ref 0.350–4.500)

## 2021-09-24 SURGERY — CANCELLED PROCEDURE

## 2021-09-24 MED ORDER — POTASSIUM CHLORIDE 20 MEQ PO PACK
40.0000 meq | PACK | Freq: Two times a day (BID) | ORAL | Status: AC
  Administered 2021-09-24 (×2): 40 meq
  Filled 2021-09-24 (×2): qty 2

## 2021-09-24 MED ORDER — AMIODARONE IV BOLUS ONLY 150 MG/100ML
150.0000 mg | Freq: Once | INTRAVENOUS | Status: AC
  Administered 2021-09-24: 150 mg via INTRAVENOUS
  Filled 2021-09-24: qty 100

## 2021-09-24 MED ORDER — MAGNESIUM SULFATE 2 GM/50ML IV SOLN
2.0000 g | Freq: Once | INTRAVENOUS | Status: AC
  Administered 2021-09-24: 2 g via INTRAVENOUS
  Filled 2021-09-24: qty 50

## 2021-09-24 MED ORDER — POTASSIUM PHOSPHATES 15 MMOLE/5ML IV SOLN
15.0000 mmol | Freq: Once | INTRAVENOUS | Status: AC
  Administered 2021-09-24: 15 mmol via INTRAVENOUS
  Filled 2021-09-24: qty 5
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
## 2021-09-24 NOTE — Progress Notes (Signed)
Patient ID: Mark [name], male   DOB: [birth year], 66 y.o.   MRN: [id number] BP 131/73 (BP Location: Right Arm)    Pulse 95    Temp 97.8 F (36.6 C) (Axillary)    Resp 18    Ht 6' (1.829 m)    Wt 54.9 kg    SpO2 94%    BMI 16.41 kg/m   Alert, moving upper extremities well Wound is clean, dry, without signs of infection Hoarse, difficulty swallowing Is able to eat On oxygen Will need rehab
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
## 2021-09-24 NOTE — Progress Notes (Signed)
RT called to assess patient due to desat on salter HFNC and 15L NRB. RT worked with patient using flutter valve. Patient has strong productive cough with moderate amount of tan sputum. RT gave patient 0800 breathing treatments at this time. Patient's RR improved. Spo2 currently 93%. RT will continue to monitor.

## 2021-09-24 NOTE — Progress Notes (Signed)
°   09/24/21 0630  Provider Notification  Provider Name/Title Dr. Bobbye Morton  Date Provider Notified 09/24/21  Time Provider Notified [phone]  Notification Type Page  Notification Reason Change in status  Provider response See new orders (Amio 150 bolus IV. Call Cardiology)  Date of Provider Response 09/24/21  Time of Provider Response 0630    Patient went into A fib RVR tachy up to 170's. Patient was agitated and desated to the 62. RN placed patient on nonrebreather and paged respiratory. Amio bolus started, cardiology paged and they agree with amio bolus and will round on patient first thing in the morning.
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
## 2021-09-25 ENCOUNTER — Inpatient Hospital Stay (HOSPITAL_COMMUNITY): Payer: Medicare Other

## 2021-09-25 LAB — EXPECTORATED SPUTUM ASSESSMENT W GRAM STAIN, RFLX TO RESP C

## 2021-09-25 LAB — CBC
HCT: 25.8 % — ABNORMAL LOW (ref 39.0–52.0)
Hemoglobin: 8.5 g/dL — ABNORMAL LOW (ref 13.0–17.0)
MCH: 29.7 pg (ref 26.0–34.0)
MCHC: 32.9 g/dL (ref 30.0–36.0)
MCV: 90.2 fL (ref 80.0–100.0)
Platelets: 147 10*3/uL — ABNORMAL LOW (ref 150–400)
RBC: 2.86 MIL/uL — ABNORMAL LOW (ref 4.22–5.81)
RDW: 14.9 % (ref 11.5–15.5)
WBC: 11.8 10*3/uL — ABNORMAL HIGH (ref 4.0–10.5)
nRBC: 0 % (ref 0.0–0.2)

## 2021-09-25 LAB — BASIC METABOLIC PANEL
Anion gap: 6 (ref 5–15)
BUN: 10 mg/dL (ref 8–23)
CO2: 34 mmol/L — ABNORMAL HIGH (ref 22–32)
Calcium: 8.2 mg/dL — ABNORMAL LOW (ref 8.9–10.3)
Chloride: 100 mmol/L (ref 98–111)
Creatinine, Ser: 0.46 mg/dL — ABNORMAL LOW (ref 0.61–1.24)
GFR, Estimated: >60 mL/min (ref >60)
Glucose, Bld: 158 mg/dL — ABNORMAL HIGH (ref 70–99)
Potassium: 4.1 mmol/L (ref 3.5–5.1)
Sodium: 140 mmol/L (ref 135–145)

## 2021-09-25 LAB — MAGNESIUM: Magnesium: 1.7 mg/dL (ref 1.7–2.4)

## 2021-09-25 LAB — GLUCOSE, CAPILLARY
Glucose-Capillary: 146 mg/dL — ABNORMAL HIGH (ref 70–99)
Glucose-Capillary: 159 mg/dL — ABNORMAL HIGH (ref 70–99)
Glucose-Capillary: 212 mg/dL — ABNORMAL HIGH (ref 70–99)

## 2021-09-25 LAB — PHOSPHORUS
Phosphorus: 1.3 mg/dL — ABNORMAL LOW (ref 2.5–4.6)
Phosphorus: 3.3 mg/dL (ref 2.5–4.6)

## 2021-09-25 MED ORDER — OSMOLITE 1.5 CAL PO LIQD
1000.0000 mL | ORAL | Status: DC
  Administered 2021-09-26 (×2): 1000 mL

## 2021-09-25 MED ORDER — BUDESONIDE 0.5 MG/2ML IN SUSP
0.5000 mg | Freq: Two times a day (BID) | RESPIRATORY_TRACT | Status: DC
  Administered 2021-09-25 – 2021-10-06 (×22): 0.5 mg via RESPIRATORY_TRACT
  Filled 2021-09-25 (×22): qty 2

## 2021-09-25 MED ORDER — MAGNESIUM SULFATE 4 GM/100ML IV SOLN
4.0000 g | Freq: Once | INTRAVENOUS | Status: AC
  Administered 2021-09-25: 4 g via INTRAVENOUS
  Filled 2021-09-25: qty 100

## 2021-09-25 MED ORDER — INSULIN ASPART 100 UNIT/ML IJ SOLN
0.0000 [IU] | INTRAMUSCULAR | Status: DC
  Administered 2021-09-25 – 2021-09-26 (×2): 3 [IU] via SUBCUTANEOUS
  Administered 2021-09-26: 2 [IU] via SUBCUTANEOUS
  Administered 2021-09-26: 5 [IU] via SUBCUTANEOUS
  Administered 2021-09-26: 2 [IU] via SUBCUTANEOUS
  Administered 2021-09-26 – 2021-09-27 (×4): 3 [IU] via SUBCUTANEOUS
  Administered 2021-09-27 (×3): 2 [IU] via SUBCUTANEOUS
  Administered 2021-09-27: 7 [IU] via SUBCUTANEOUS
  Administered 2021-09-28: 2 [IU] via SUBCUTANEOUS
  Administered 2021-09-28: 3 [IU] via SUBCUTANEOUS
  Administered 2021-09-28 (×4): 2 [IU] via SUBCUTANEOUS
  Administered 2021-09-28 – 2021-09-29 (×3): 3 [IU] via SUBCUTANEOUS

## 2021-09-25 MED ORDER — PANTOPRAZOLE 2 MG/ML SUSPENSION
40.0000 mg | Freq: Every day | ORAL | Status: DC
  Administered 2021-09-25 – 2021-10-06 (×12): 40 mg
  Filled 2021-09-25 (×12): qty 20

## 2021-09-25 MED ORDER — METHYLPREDNISOLONE SODIUM SUCC 40 MG IJ SOLR
40.0000 mg | Freq: Every day | INTRAMUSCULAR | Status: AC
  Administered 2021-09-25 – 2021-09-27 (×3): 40 mg via INTRAVENOUS
  Filled 2021-09-25 (×3): qty 1

## 2021-09-25 MED ORDER — HYDROMORPHONE HCL 1 MG/ML IJ SOLN
0.5000 mg | INTRAMUSCULAR | Status: DC | PRN
  Administered 2021-09-25 – 2021-09-30 (×14): 0.5 mg via INTRAVENOUS
  Filled 2021-09-25 (×15): qty 1

## 2021-09-25 MED ORDER — REVEFENACIN 175 MCG/3ML IN SOLN
175.0000 ug | Freq: Every day | RESPIRATORY_TRACT | Status: DC
  Administered 2021-09-25 – 2021-10-06 (×11): 175 ug via RESPIRATORY_TRACT
  Filled 2021-09-25 (×13): qty 3

## 2021-09-25 MED ORDER — POTASSIUM PHOSPHATES 15 MMOLE/5ML IV SOLN
45.0000 mmol | Freq: Once | INTRAVENOUS | Status: AC
  Administered 2021-09-25: 45 mmol via INTRAVENOUS
  Filled 2021-09-25: qty 15

## 2021-09-25 NOTE — Progress Notes (Signed)
Patient ID: Mark [name], male   DOB: Feb 02, 1956, 66 y.o.   MRN: [id number] BP 119/66 (BP Location: Right Arm)    Pulse 90    Temp 97.8 F (36.6 C) (Axillary)    Resp 20    Ht 6' (1.829 m)    Wt 54.9 kg    SpO2 100%    BMI 16.41 kg/m  Alert and oriented x 4 Moving all extremities wound is clean, dry, no signs of infection Slowly improving
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
## 2021-09-25 NOTE — Progress Notes (Signed)
CBG @ 212, notified on call physician due to greater than 160.   New orders placed for insulin coverage.
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
## 2021-09-25 NOTE — Progress Notes (Signed)
Inpatient Rehab Admissions Coordinator:  Consult received. Attempted to meet pt but was with another discipline. Note pt is on HFNC 15L, 50% FiO2.  Will continue to follow medical workup and progress with therapies.   Gayland Curry, [place], [place] Admissions Coordinator [phone]

## 2021-09-26 ENCOUNTER — Inpatient Hospital Stay (HOSPITAL_COMMUNITY): Payer: Medicare Other

## 2021-09-26 LAB — CBC
HCT: 26.4 % — ABNORMAL LOW (ref 39.0–52.0)
Hemoglobin: 8.5 g/dL — ABNORMAL LOW (ref 13.0–17.0)
MCH: 29.1 pg (ref 26.0–34.0)
MCHC: 32.2 g/dL (ref 30.0–36.0)
MCV: 90.4 fL (ref 80.0–100.0)
Platelets: 182 10*3/uL (ref 150–400)
RBC: 2.92 MIL/uL — ABNORMAL LOW (ref 4.22–5.81)
RDW: 14.6 % (ref 11.5–15.5)
WBC: 11 10*3/uL — ABNORMAL HIGH (ref 4.0–10.5)
nRBC: 0.2 % (ref 0.0–0.2)

## 2021-09-26 LAB — GLUCOSE, CAPILLARY
Glucose-Capillary: 199 mg/dL — ABNORMAL HIGH (ref 70–99)
Glucose-Capillary: 200 mg/dL — ABNORMAL HIGH (ref 70–99)
Glucose-Capillary: 221 mg/dL — ABNORMAL HIGH (ref 70–99)
Glucose-Capillary: 222 mg/dL — ABNORMAL HIGH (ref 70–99)
Glucose-Capillary: 248 mg/dL — ABNORMAL HIGH (ref 70–99)
Glucose-Capillary: 260 mg/dL — ABNORMAL HIGH (ref 70–99)
Glucose-Capillary: 268 mg/dL — ABNORMAL HIGH (ref 70–99)

## 2021-09-26 LAB — BASIC METABOLIC PANEL
Anion gap: 10 (ref 5–15)
BUN: 12 mg/dL (ref 8–23)
CO2: 33 mmol/L — ABNORMAL HIGH (ref 22–32)
Calcium: 8.2 mg/dL — ABNORMAL LOW (ref 8.9–10.3)
Chloride: 95 mmol/L — ABNORMAL LOW (ref 98–111)
Creatinine, Ser: 0.48 mg/dL — ABNORMAL LOW (ref 0.61–1.24)
GFR, Estimated: >60 mL/min (ref >60)
Glucose, Bld: 204 mg/dL — ABNORMAL HIGH (ref 70–99)
Potassium: 5 mmol/L (ref 3.5–5.1)
Sodium: 138 mmol/L (ref 135–145)

## 2021-09-26 LAB — MAGNESIUM: Magnesium: 1.8 mg/dL (ref 1.7–2.4)

## 2021-09-26 LAB — PHOSPHORUS: Phosphorus: 2.3 mg/dL — ABNORMAL LOW (ref 2.5–4.6)

## 2021-09-26 MED ORDER — SENNA 8.6 MG PO TABS
2.0000 | ORAL_TABLET | Freq: Every day | ORAL | Status: DC
  Administered 2021-09-27: 17.2 mg via ORAL
  Filled 2021-09-26: qty 2

## 2021-09-26 MED ORDER — SODIUM PHOSPHATES 45 MMOLE/15ML IV SOLN
15.0000 mmol | Freq: Once | INTRAVENOUS | Status: AC
  Administered 2021-09-26: 15 mmol via INTRAVENOUS
  Filled 2021-09-26: qty 5

## 2021-09-26 MED ORDER — MAGNESIUM SULFATE 4 GM/100ML IV SOLN
4.0000 g | Freq: Once | INTRAVENOUS | Status: AC
  Administered 2021-09-26: 4 g via INTRAVENOUS
  Filled 2021-09-26: qty 100

## 2021-09-26 MED ORDER — PROSOURCE TF PO LIQD
45.0000 mL | Freq: Every day | ORAL | Status: DC
  Administered 2021-09-27 – 2021-10-06 (×10): 45 mL
  Filled 2021-09-26 (×10): qty 45

## 2021-09-26 MED ORDER — OSMOLITE 1.5 CAL PO LIQD
1000.0000 mL | ORAL | Status: DC
  Administered 2021-09-26 – 2021-10-04 (×9): 1000 mL

## 2021-09-26 NOTE — Progress Notes (Signed)
Patient ID: Mark [name], male   DOB: 1956-01-11, 66 y.o.   MRN: [id number] BP (!) 141/76    Pulse (!) 101    Temp 98.2 F (36.8 C) (Oral)    Resp (!) 24    Ht 6' (1.829 m)    Wt 54.9 kg    SpO2 92%    BMI 16.41 kg/m  Wound is clean, dry Moving all extremities No changes

## 2021-09-26 NOTE — Progress Notes (Signed)
Unable to perform CPT at this time d/t Physical therapy in room walking pt.  Per RN pt is going to the chair.

## 2021-09-26 NOTE — Progress Notes (Signed)
PT Cancellation Note  Patient Details Name: Mark [name] MRN: [id number] DOB: [birth year]   Cancelled Treatment:    Reason Eval/Treat Not Completed: Fatigue/lethargy limiting ability to participate Patient just back to bed with nursing after up earlier with OT and in chair good portion of the day.  Will attempt again another day.  Reginia Naas 09/26/2021, 5:12 PM Magda Kiel, PT Acute Rehabilitation Services [phone] Office:[phone] 09/26/2021
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
## 2021-09-26 NOTE — Progress Notes (Signed)
° °  Cardiology initially consulted on 2/6 for evaluation of atrial fibrillation with RVR.   Patient developed Afib with RVR on 2/5. He was given an amiodarone bolus followed by amiodarone infusion. Converted to NSR the evening of 2/5. Transitioned to oral amiodarone on 2/6. Will continue oral amiodarone 200 mg BID for 2 weeks (to end on 2/20) and will then decrease dose to 200 mg daily.   Reviewed telemetry and patient remains in sinus rhythm. No episodes of afib or flutter noted.   Margie Billet, PA-C 09/26/2021 9:42 AM
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
## 2021-09-27 LAB — CBC
HCT: 24.4 % — ABNORMAL LOW (ref 39.0–52.0)
Hemoglobin: 7.8 g/dL — ABNORMAL LOW (ref 13.0–17.0)
MCH: 28.8 pg (ref 26.0–34.0)
MCHC: 32 g/dL (ref 30.0–36.0)
MCV: 90 fL (ref 80.0–100.0)
Platelets: 236 10*3/uL (ref 150–400)
RBC: 2.71 MIL/uL — ABNORMAL LOW (ref 4.22–5.81)
RDW: 14.9 % (ref 11.5–15.5)
WBC: 8.9 10*3/uL (ref 4.0–10.5)
nRBC: 0.6 % — ABNORMAL HIGH (ref 0.0–0.2)

## 2021-09-27 LAB — PHOSPHORUS: Phosphorus: 2.2 mg/dL — ABNORMAL LOW (ref 2.5–4.6)

## 2021-09-27 LAB — GLUCOSE, CAPILLARY
Glucose-Capillary: 189 mg/dL — ABNORMAL HIGH (ref 70–99)
Glucose-Capillary: 174 mg/dL — ABNORMAL HIGH (ref 70–99)
Glucose-Capillary: 228 mg/dL — ABNORMAL HIGH (ref 70–99)
Glucose-Capillary: 316 mg/dL — ABNORMAL HIGH (ref 70–99)
Glucose-Capillary: 181 mg/dL — ABNORMAL HIGH (ref 70–99)
Glucose-Capillary: 168 mg/dL — ABNORMAL HIGH (ref 70–99)

## 2021-09-27 LAB — MAGNESIUM: Magnesium: 1.9 mg/dL (ref 1.7–2.4)

## 2021-09-27 LAB — BASIC METABOLIC PANEL
Anion gap: 6 (ref 5–15)
BUN: 18 mg/dL (ref 8–23)
CO2: 40 mmol/L — ABNORMAL HIGH (ref 22–32)
Calcium: 8.4 mg/dL — ABNORMAL LOW (ref 8.9–10.3)
Chloride: 91 mmol/L — ABNORMAL LOW (ref 98–111)
Creatinine, Ser: 0.54 mg/dL — ABNORMAL LOW (ref 0.61–1.24)
GFR, Estimated: >60 mL/min (ref >60)
Glucose, Bld: 211 mg/dL — ABNORMAL HIGH (ref 70–99)
Potassium: 4.2 mmol/L (ref 3.5–5.1)
Sodium: 137 mmol/L (ref 135–145)

## 2021-09-27 MED ORDER — GLYCERIN (LAXATIVE) 2 G RE SUPP
1.0000 | Freq: Once | RECTAL | Status: AC
  Administered 2021-09-27: 1 via RECTAL
  Filled 2021-09-27: qty 1

## 2021-09-27 MED ORDER — MAGNESIUM SULFATE 4 GM/100ML IV SOLN
4.0000 g | Freq: Once | INTRAVENOUS | Status: AC
  Administered 2021-09-27: 4 g via INTRAVENOUS
  Filled 2021-09-27: qty 100

## 2021-09-27 MED ORDER — VITAMIN D 25 MCG (1000 UNIT) PO TABS
2000.0000 [IU] | ORAL_TABLET | Freq: Every day | ORAL | Status: DC
  Administered 2021-09-28 – 2021-10-06 (×9): 2000 [IU]
  Filled 2021-09-27 (×9): qty 2

## 2021-09-27 MED ORDER — GABAPENTIN 250 MG/5ML PO SOLN
400.0000 mg | Freq: Three times a day (TID) | ORAL | Status: DC
  Administered 2021-09-27 – 2021-10-06 (×26): 400 mg
  Filled 2021-09-27 (×30): qty 8

## 2021-09-27 MED ORDER — PREGABALIN 75 MG PO CAPS
150.0000 mg | ORAL_CAPSULE | Freq: Two times a day (BID) | ORAL | Status: DC
  Administered 2021-09-27: 150 mg via ORAL
  Filled 2021-09-27: qty 2

## 2021-09-27 MED ORDER — SENNA 8.6 MG PO TABS
2.0000 | ORAL_TABLET | Freq: Every day | ORAL | Status: DC
  Administered 2021-09-28 – 2021-10-01 (×3): 17.2 mg
  Filled 2021-09-27 (×4): qty 2

## 2021-09-27 MED ORDER — SODIUM PHOSPHATES 45 MMOLE/15ML IV SOLN
30.0000 mmol | Freq: Once | INTRAVENOUS | Status: AC
  Administered 2021-09-27: 30 mmol via INTRAVENOUS
  Filled 2021-09-27: qty 10

## 2021-09-27 MED ORDER — AMIODARONE HCL 200 MG PO TABS: 200.0000 mg | ORAL_TABLET | Freq: Every day | ORAL | Status: DC

## 2021-09-27 NOTE — Progress Notes (Signed)
Inpatient Rehab Admissions Coordinator:  Note pt on HFNC 7L, FiO2 of 50%. Will continue to follow medical workup and progress with therapies.   Gayland Curry, [place], [place] Admissions Coordinator [phone]

## 2021-09-27 NOTE — Progress Notes (Signed)
RT NOTES: 1600 CPT held. Patient asleep at this time.
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
## 2021-09-27 NOTE — Progress Notes (Signed)
° ° °  Pt remains in NSR At this point, he has maintained sinus rhythm. I think we can DC amiodarone at this point Please call us back for questions.    Mertie Moores, MD  09/27/2021 4:26 PM    [facility] [place],  [place], [place]  [address] Phone: [phone]; Fax: [phone]

## 2021-09-27 NOTE — Progress Notes (Signed)
Inpatient Diabetes Program Recommendations  AACE/ADA: New Consensus Statement on Inpatient Glycemic Control (2015)  Target Ranges:  Prepandial:   less than 140 mg/dL      Peak postprandial:   less than 180 mg/dL (1-2 hours)      Critically ill patients:  140 - 180 mg/dL   Lab Results  Component Value Date   GLUCAP 228 (H) 09/27/2021    Review of Glycemic Control  Latest Reference Range & Units 09/26/21 15:34 09/26/21 19:18 09/26/21 23:08 09/27/21 03:09 09/27/21 07:50 09/27/21 11:40  Glucose-Capillary 70 - 99 mg/dL 268 (H) 248 (H) 260 (H) 189 (H) 174 (H) 228 (H)   Diabetes history: None Outpatient Diabetes medications:  None Current orders for Inpatient glycemic control:  Novolog sensitive q 4 hours Osmolite 60 ml/hr  Inpatient Diabetes Program Recommendations:    May consider adding Novolog tube feed 3 units q 4 hours.   Thanks,  Adah Perl, RN, BC-ADM Inpatient Diabetes Coordinator Pager [phone]  (8a-5p)
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
## 2021-09-27 NOTE — Progress Notes (Signed)
Patient ID: Mark [name], male   DOB: 02-07-1956, 66 y.o.   MRN: [id number] BP (!) 137/93    Pulse 86    Temp 98.6 F (37 C) (Oral)    Resp (!) 24    Ht 6' (1.829 m)    Wt 60.5 kg    SpO2 100%    BMI 18.09 kg/m  Alert and oriented Unable to tolerate further weaning O2 Moving all extremities well Wound is clean, dry

## 2021-09-27 NOTE — Progress Notes (Signed)
RT NOTES: Unable to do 1200 CPT at this time. Pt working with therapy. Will check back later.

## 2021-09-28 DIAGNOSIS — L8990 Pressure ulcer of unspecified site, unspecified stage: Secondary | ICD-10-CM | POA: Insufficient documentation

## 2021-09-28 LAB — GLUCOSE, CAPILLARY
Glucose-Capillary: 182 mg/dL — ABNORMAL HIGH (ref 70–99)
Glucose-Capillary: 156 mg/dL — ABNORMAL HIGH (ref 70–99)
Glucose-Capillary: 163 mg/dL — ABNORMAL HIGH (ref 70–99)
Glucose-Capillary: 204 mg/dL — ABNORMAL HIGH (ref 70–99)
Glucose-Capillary: 187 mg/dL — ABNORMAL HIGH (ref 70–99)
Glucose-Capillary: 208 mg/dL — ABNORMAL HIGH (ref 70–99)

## 2021-09-28 LAB — PHOSPHORUS: Phosphorus: 3.2 mg/dL (ref 2.5–4.6)

## 2021-09-28 LAB — MAGNESIUM: Magnesium: 2 mg/dL (ref 1.7–2.4)
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
## 2021-09-28 NOTE — Progress Notes (Signed)
Inpatient Rehab Admissions:  Inpatient Rehab Consult received.  I met with patient at the bedside for rehabilitation assessment and to discuss goals and expectations of an inpatient rehab admission.  Pt acknowledged understanding of CIR goals and expectations. Pt interested in pursuing CIR. Pt gave permission to contact wife. Spoke with his wife, Anne Ng on the telephone. She acknowledged understanding. She is supportive of pt pursuing CIR. She confirmed that she will be able to provide 24/7 supervision/assistance for pt after discharge. Note pt is on HFNC 5L, FiO2 50%. Will continue to follow.   Signed: Gayland Curry, [place], [place] Admissions Coordinator [phone]
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
## 2021-09-29 ENCOUNTER — Inpatient Hospital Stay (HOSPITAL_COMMUNITY): Payer: Medicare Other

## 2021-09-29 LAB — URINALYSIS, COMPLETE (UACMP) WITH MICROSCOPIC
Bacteria, UA: NONE SEEN
Bilirubin Urine: NEGATIVE
Glucose, UA: 50 mg/dL — AB
Hgb urine dipstick: NEGATIVE
Ketones, ur: NEGATIVE mg/dL
Leukocytes,Ua: NEGATIVE
Nitrite: NEGATIVE
Protein, ur: NEGATIVE mg/dL
Specific Gravity, Urine: 1.013 (ref 1.005–1.030)
pH: 9 — ABNORMAL HIGH (ref 5.0–8.0)

## 2021-09-29 LAB — CBC
HCT: 28.3 % — ABNORMAL LOW (ref 39.0–52.0)
Hemoglobin: 9.1 g/dL — ABNORMAL LOW (ref 13.0–17.0)
MCH: 29.3 pg (ref 26.0–34.0)
MCHC: 32.2 g/dL (ref 30.0–36.0)
MCV: 91 fL (ref 80.0–100.0)
Platelets: 346 10*3/uL (ref 150–400)
RBC: 3.11 MIL/uL — ABNORMAL LOW (ref 4.22–5.81)
RDW: 15.3 % (ref 11.5–15.5)
WBC: 14.1 10*3/uL — ABNORMAL HIGH (ref 4.0–10.5)
nRBC: 0 % (ref 0.0–0.2)

## 2021-09-29 LAB — BASIC METABOLIC PANEL
Anion gap: 8 (ref 5–15)
BUN: 20 mg/dL (ref 8–23)
CO2: 36 mmol/L — ABNORMAL HIGH (ref 22–32)
Calcium: 8.6 mg/dL — ABNORMAL LOW (ref 8.9–10.3)
Chloride: 90 mmol/L — ABNORMAL LOW (ref 98–111)
Creatinine, Ser: 0.53 mg/dL — ABNORMAL LOW (ref 0.61–1.24)
GFR, Estimated: >60 mL/min (ref >60)
Glucose, Bld: 158 mg/dL — ABNORMAL HIGH (ref 70–99)
Potassium: 4.5 mmol/L (ref 3.5–5.1)
Sodium: 134 mmol/L — ABNORMAL LOW (ref 135–145)

## 2021-09-29 LAB — GLUCOSE, CAPILLARY
Glucose-Capillary: 148 mg/dL — ABNORMAL HIGH (ref 70–99)
Glucose-Capillary: 165 mg/dL — ABNORMAL HIGH (ref 70–99)
Glucose-Capillary: 188 mg/dL — ABNORMAL HIGH (ref 70–99)
Glucose-Capillary: 193 mg/dL — ABNORMAL HIGH (ref 70–99)
Glucose-Capillary: 212 mg/dL — ABNORMAL HIGH (ref 70–99)
Glucose-Capillary: 229 mg/dL — ABNORMAL HIGH (ref 70–99)

## 2021-09-29 LAB — PHOSPHORUS: Phosphorus: 3.8 mg/dL (ref 2.5–4.6)

## 2021-09-29 LAB — MAGNESIUM: Magnesium: 1.8 mg/dL (ref 1.7–2.4)

## 2021-09-29 MED ORDER — ALBUMIN HUMAN 5 % IV SOLN
12.5000 g | Freq: Once | INTRAVENOUS | Status: AC
  Administered 2021-09-29: 12.5 g via INTRAVENOUS

## 2021-09-29 MED ORDER — ALBUMIN HUMAN 5 % IV SOLN
INTRAVENOUS | Status: AC
  Filled 2021-09-29: qty 250

## 2021-09-29 MED ORDER — ALBUMIN HUMAN 5 % IV SOLN
12.5000 g | Freq: Once | INTRAVENOUS | Status: AC
  Administered 2021-09-29: 12.5 g via INTRAVENOUS
  Filled 2021-09-29: qty 250

## 2021-09-29 MED ORDER — MAGNESIUM SULFATE 2 GM/50ML IV SOLN
2.0000 g | Freq: Once | INTRAVENOUS | Status: AC
  Administered 2021-09-29: 2 g via INTRAVENOUS
  Filled 2021-09-29: qty 50

## 2021-09-29 MED ORDER — INSULIN ASPART 100 UNIT/ML IJ SOLN
0.0000 [IU] | INTRAMUSCULAR | Status: DC
  Administered 2021-09-29: 3 [IU] via SUBCUTANEOUS
  Administered 2021-09-29: 2 [IU] via SUBCUTANEOUS
  Administered 2021-09-29 – 2021-09-30 (×4): 3 [IU] via SUBCUTANEOUS
  Administered 2021-09-30: 5 [IU] via SUBCUTANEOUS
  Administered 2021-09-30 – 2021-10-01 (×2): 3 [IU] via SUBCUTANEOUS
  Administered 2021-10-01: 5 [IU] via SUBCUTANEOUS
  Administered 2021-10-01 (×2): 2 [IU] via SUBCUTANEOUS
  Administered 2021-10-01: 5 [IU] via SUBCUTANEOUS
  Administered 2021-10-01 – 2021-10-02 (×3): 3 [IU] via SUBCUTANEOUS
  Administered 2021-10-02: 2 [IU] via SUBCUTANEOUS
  Administered 2021-10-02: 5 [IU] via SUBCUTANEOUS
  Administered 2021-10-02 – 2021-10-03 (×2): 2 [IU] via SUBCUTANEOUS
  Administered 2021-10-03 (×2): 3 [IU] via SUBCUTANEOUS
  Administered 2021-10-03 (×2): 2 [IU] via SUBCUTANEOUS
  Administered 2021-10-03 – 2021-10-04 (×5): 3 [IU] via SUBCUTANEOUS
  Administered 2021-10-05: 5 [IU] via SUBCUTANEOUS
  Administered 2021-10-05: 2 [IU] via SUBCUTANEOUS
  Administered 2021-10-05: 5 [IU] via SUBCUTANEOUS
  Administered 2021-10-05: 2 [IU] via SUBCUTANEOUS
  Administered 2021-10-05: 5 [IU] via SUBCUTANEOUS
  Administered 2021-10-06 (×2): 3 [IU] via SUBCUTANEOUS
  Administered 2021-10-06: 2 [IU] via SUBCUTANEOUS
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
## 2021-09-29 NOTE — Progress Notes (Signed)
Pt BP 50s/30s twice at 1850. Pt other vitals, urine outpt, mentation WNL. MD notified, orders received.
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
## 2021-09-30 ENCOUNTER — Inpatient Hospital Stay (HOSPITAL_COMMUNITY): Payer: Medicare Other

## 2021-09-30 LAB — GLUCOSE, CAPILLARY
Glucose-Capillary: 165 mg/dL — ABNORMAL HIGH (ref 70–99)
Glucose-Capillary: 210 mg/dL — ABNORMAL HIGH (ref 70–99)
Glucose-Capillary: 154 mg/dL — ABNORMAL HIGH (ref 70–99)
Glucose-Capillary: 157 mg/dL — ABNORMAL HIGH (ref 70–99)
Glucose-Capillary: 120 mg/dL — ABNORMAL HIGH (ref 70–99)
Glucose-Capillary: 209 mg/dL — ABNORMAL HIGH (ref 70–99)

## 2021-09-30 LAB — CBC
HCT: 28.6 % — ABNORMAL LOW (ref 39.0–52.0)
Hemoglobin: 8.8 g/dL — ABNORMAL LOW (ref 13.0–17.0)
MCH: 28.7 pg (ref 26.0–34.0)
MCHC: 30.8 g/dL (ref 30.0–36.0)
MCV: 93.2 fL (ref 80.0–100.0)
Platelets: 394 10*3/uL (ref 150–400)
RBC: 3.07 MIL/uL — ABNORMAL LOW (ref 4.22–5.81)
RDW: 15.3 % (ref 11.5–15.5)
WBC: 14.3 10*3/uL — ABNORMAL HIGH (ref 4.0–10.5)
nRBC: 0 % (ref 0.0–0.2)

## 2021-09-30 LAB — BASIC METABOLIC PANEL
Anion gap: 7 (ref 5–15)
BUN: 26 mg/dL — ABNORMAL HIGH (ref 8–23)
CO2: 36 mmol/L — ABNORMAL HIGH (ref 22–32)
Calcium: 8.8 mg/dL — ABNORMAL LOW (ref 8.9–10.3)
Chloride: 93 mmol/L — ABNORMAL LOW (ref 98–111)
Creatinine, Ser: 0.61 mg/dL (ref 0.61–1.24)
GFR, Estimated: >60 mL/min (ref >60)
Glucose, Bld: 200 mg/dL — ABNORMAL HIGH (ref 70–99)
Potassium: 4.5 mmol/L (ref 3.5–5.1)
Sodium: 136 mmol/L (ref 135–145)

## 2021-09-30 LAB — MAGNESIUM: Magnesium: 2.1 mg/dL (ref 1.7–2.4)

## 2021-09-30 LAB — PHOSPHORUS: Phosphorus: 3 mg/dL (ref 2.5–4.6)

## 2021-09-30 MED ORDER — BISACODYL 10 MG RE SUPP: 10.0000 mg | Freq: Every day | RECTAL | Status: DC

## 2021-09-30 MED ORDER — POLYETHYLENE GLYCOL 3350 17 G PO PACK
17.0000 g | PACK | Freq: Two times a day (BID) | ORAL | Status: DC
  Administered 2021-09-30 – 2021-10-05 (×7): 17 g
  Filled 2021-09-30 (×8): qty 1

## 2021-09-30 MED ORDER — MAGNESIUM HYDROXIDE 400 MG/5ML PO SUSP: 30.0000 mL | Freq: Every day | ORAL | Status: DC

## 2021-09-30 MED ORDER — SODIUM CHLORIDE 0.9 % IV BOLUS
1000.0000 mL | Freq: Once | INTRAVENOUS | Status: AC
  Administered 2021-09-30: 1000 mL via INTRAVENOUS

## 2021-09-30 MED ORDER — MIDODRINE HCL 5 MG PO TABS
5.0000 mg | ORAL_TABLET | Freq: Three times a day (TID) | ORAL | Status: DC
  Administered 2021-09-30 – 2021-10-06 (×19): 5 mg
  Filled 2021-09-30 (×19): qty 1

## 2021-09-30 MED ORDER — ORAL CARE MOUTH RINSE
15.0000 mL | Freq: Two times a day (BID) | OROMUCOSAL | Status: DC
  Administered 2021-09-30 – 2021-10-06 (×11): 15 mL via OROMUCOSAL

## 2021-09-30 MED ORDER — CHLORHEXIDINE GLUCONATE 0.12 % MT SOLN
15.0000 mL | Freq: Two times a day (BID) | OROMUCOSAL | Status: DC
  Administered 2021-09-30 – 2021-10-06 (×11): 15 mL via OROMUCOSAL
  Filled 2021-09-30 (×9): qty 15

## 2021-09-30 NOTE — Progress Notes (Addendum)
Inpatient Diabetes Program Recommendations  AACE/ADA: New Consensus Statement on Inpatient Glycemic Control (2015)  Target Ranges:  Prepandial:   less than 140 mg/dL      Peak postprandial:   less than 180 mg/dL (1-2 hours)      Critically ill patients:  140 - 180 mg/dL   Lab Results  Component Value Date   GLUCAP 210 (H) 09/30/2021    Review of Glycemic Control  Latest Reference Range & Units 09/29/21 07:21 09/29/21 11:14 09/29/21 15:01 09/29/21 19:53 09/29/21 23:49 09/30/21 03:58 09/30/21 07:22  Glucose-Capillary 70 - 99 mg/dL 212 (H)  Novolog 3 units 165 (H)  Novolog 3 units 188 (H)  Novolog 3 units 193 (H)  Novolog 3 units 148 (H)  Novolog 2 units 165 (H)  Novolog 3 units 210 (H)  Novolog 5 units   Diabetes history: None Outpatient Diabetes medications:  None Current orders for Inpatient glycemic control:  Novolog 0-15 units q 4 hours Osmolite 60 ml/hr  Inpatient Diabetes Program Recommendations:    Note: MBS scheduled for today. If Tube Feeds remain at current dose consider: May consider adding Novolog tube feed 3 units q 4 hours.   Thanks,  Tama Headings RN, MSN, BC-ADM Inpatient Diabetes Coordinator Team Pager [phone] (8a-5p)

## 2021-09-30 NOTE — Progress Notes (Signed)
Inpatient Rehab Admissions Coordinator:  ?Insurance authorization started. Will continue to follow. ? ? ?Ary Lavine Graves Madden, MS, CCC-SLP ?Admissions Coordinator ?[phone] ? ?
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
## 2021-09-30 NOTE — Progress Notes (Addendum)
Modified Barium Swallow Progress Note  Patient Details  Name: Mark [name] MRN: [id number] Date of Birth: 11/05/1955  Today's Date: 09/30/2021  Modified Barium Swallow completed.  Full report located under Chart Review in the Imaging Section.  Brief recommendations include the following:  Clinical Impression  Pt presents with a chronic dysphagia, with effects from ACDF likely no longer at play and primary deficits related to remote radiation tx of tonsillar cancer. There is widescale reduced mobility of base of tongue against pharyngeal wall, reduced pharyngeal stripping wave, poor mobility of the larynx, reduced laryngeal vestibule closure.  There are gross deficits in propulsion of POs through the pharynx and UES - this leads to residue that sits in pharyngeal spaces and intermittently spills into larynx (thin liquids were grossly aspirated).  Pt extends great effort and swallows multiple times in an attempt to transfer the boluses through pharynx and UES, often without success. Sips of nectar liquid transfer more easily than soft solids.  Nectar liquids penetrated the larynx before, during, and after the swallow.  Repositioning to reclined posture offered no real benefit.   Pt feels that his swallowing is at baseline - this may be the case.  His risk of developing adverse consequences from aspiration is much higher now than PTA (and even then, per his report, he has had multiple bouts of pna).    Recommend continuing NPO for now until overall medical condition improves.  His baseline function meant intermittent aspiration (he was on nectar liquids PTA). Hopefully he will improve to the extent that his lungs can tolerate premorbid levels of aspiration.  Begin trials of nectar thick liquids with SLP only for now.    Swallow Evaluation Recommendations       SLP Diet Recommendations: NPO;Alternative means - temporary       Medication Administration: Via alternative means                Oral Care Recommendations: Oral care BID   Other Recommendations: Have oral suction available   Draco Malczewski L. Tivis Ringer, [place] Office number [phone] Pager [phone]  Juan Quam Laurice 09/30/2021,2:39 PM

## 2021-09-30 NOTE — Progress Notes (Signed)
Speech Language Pathology Treatment:    Patient Details Name: Mark [name] MRN: [id number] DOB: 26-Jun-1956 Today's Date: 09/30/2021 Time:  -      MBS scheduled for 1:00.                          Houston Siren  09/30/2021, 8:54 AM

## 2021-09-30 NOTE — Progress Notes (Signed)
Patient ID: Mark [name], male   DOB: 18-Aug-1956, 66 y.o.   MRN: [id number] Alert, improving  Moving all extremities well Wound is healing well Working on his progression.
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
## 2021-09-30 NOTE — Progress Notes (Signed)
Patient received his scheduled klonopin & trazodone @ 1830 along with 0.5 IV dilaudid.  At 1900 patient sleeping & BP 70s/50s. Was told this also happened yesterday at the same time and it gradually improved w/ albumin.  Woke patient and rechecked it & SBP gradually improved to low 80s over the next 2 hours as dilaudid wore off. At 2115 BP 73/50 again. Notified Dr Kieth Brightly of hypotension and order received for 1L NS bolus.

## 2021-10-01 LAB — CBC
HCT: 25.7 % — ABNORMAL LOW (ref 39.0–52.0)
Hemoglobin: 8.2 g/dL — ABNORMAL LOW (ref 13.0–17.0)
MCH: 29.6 pg (ref 26.0–34.0)
MCHC: 31.9 g/dL (ref 30.0–36.0)
MCV: 92.8 fL (ref 80.0–100.0)
Platelets: 417 10*3/uL — ABNORMAL HIGH (ref 150–400)
RBC: 2.77 MIL/uL — ABNORMAL LOW (ref 4.22–5.81)
RDW: 15.5 % (ref 11.5–15.5)
WBC: 10.3 10*3/uL (ref 4.0–10.5)
nRBC: 0 % (ref 0.0–0.2)

## 2021-10-01 LAB — GLUCOSE, CAPILLARY
Glucose-Capillary: 182 mg/dL — ABNORMAL HIGH (ref 70–99)
Glucose-Capillary: 122 mg/dL — ABNORMAL HIGH (ref 70–99)
Glucose-Capillary: 162 mg/dL — ABNORMAL HIGH (ref 70–99)
Glucose-Capillary: 109 mg/dL — ABNORMAL HIGH (ref 70–99)
Glucose-Capillary: 211 mg/dL — ABNORMAL HIGH (ref 70–99)
Glucose-Capillary: 138 mg/dL — ABNORMAL HIGH (ref 70–99)

## 2021-10-01 LAB — BASIC METABOLIC PANEL
Anion gap: 7 (ref 5–15)
BUN: 23 mg/dL (ref 8–23)
CO2: 31 mmol/L (ref 22–32)
Calcium: 8.4 mg/dL — ABNORMAL LOW (ref 8.9–10.3)
Chloride: 98 mmol/L (ref 98–111)
Creatinine, Ser: 0.53 mg/dL — ABNORMAL LOW (ref 0.61–1.24)
GFR, Estimated: >60 mL/min (ref >60)
Glucose, Bld: 178 mg/dL — ABNORMAL HIGH (ref 70–99)
Potassium: 5 mmol/L (ref 3.5–5.1)
Sodium: 136 mmol/L (ref 135–145)

## 2021-10-01 LAB — PHOSPHORUS: Phosphorus: 3.1 mg/dL (ref 2.5–4.6)

## 2021-10-01 LAB — MAGNESIUM: Magnesium: 1.8 mg/dL (ref 1.7–2.4)

## 2021-10-01 MED ORDER — TRAZODONE HCL 50 MG PO TABS
150.0000 mg | ORAL_TABLET | Freq: Every evening | ORAL | Status: DC | PRN
  Administered 2021-10-01: 150 mg
  Filled 2021-10-01: qty 3

## 2021-10-01 MED ORDER — METHOCARBAMOL 500 MG PO TABS
1000.0000 mg | ORAL_TABLET | Freq: Three times a day (TID) | ORAL | Status: DC
  Administered 2021-10-01 – 2021-10-06 (×16): 1000 mg
  Filled 2021-10-01 (×16): qty 2
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
## 2021-10-01 NOTE — Progress Notes (Signed)
Inpatient Rehab Admissions Coordinator:   Insurance authorization pending.  I will follow.   Shann Medal, PT, DPT Admissions Coordinator [phone] 10/01/21  12:06 PM
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
## 2021-10-01 NOTE — Progress Notes (Signed)
Patient ID: Mark [name], male   DOB: [birth year], 66 y.o.   MRN: [id number] BP 126/76    Pulse 77    Temp 98.1 F (36.7 C) (Oral)    Resp 18    Ht 6' (1.829 m)    Wt 54.4 kg    SpO2 98%    BMI 16.27 kg/m  Alert and moving upper extremities Wound is clean, dry no signs of infection No new recommendations

## 2021-10-02 LAB — PHOSPHORUS: Phosphorus: 3.2 mg/dL (ref 2.5–4.6)

## 2021-10-02 LAB — CBC
HCT: 27.8 % — ABNORMAL LOW (ref 39.0–52.0)
Hemoglobin: 8.8 g/dL — ABNORMAL LOW (ref 13.0–17.0)
MCH: 29.5 pg (ref 26.0–34.0)
MCHC: 31.7 g/dL (ref 30.0–36.0)
MCV: 93.3 fL (ref 80.0–100.0)
Platelets: 526 K/uL — ABNORMAL HIGH (ref 150–400)
RBC: 2.98 MIL/uL — ABNORMAL LOW (ref 4.22–5.81)
RDW: 15.9 % — ABNORMAL HIGH (ref 11.5–15.5)
WBC: 10 K/uL (ref 4.0–10.5)
nRBC: 0 % (ref 0.0–0.2)

## 2021-10-02 LAB — GLUCOSE, CAPILLARY
Glucose-Capillary: 211 mg/dL — ABNORMAL HIGH (ref 70–99)
Glucose-Capillary: 149 mg/dL — ABNORMAL HIGH (ref 70–99)
Glucose-Capillary: 180 mg/dL — ABNORMAL HIGH (ref 70–99)
Glucose-Capillary: 160 mg/dL — ABNORMAL HIGH (ref 70–99)
Glucose-Capillary: 131 mg/dL — ABNORMAL HIGH (ref 70–99)
Glucose-Capillary: 165 mg/dL — ABNORMAL HIGH (ref 70–99)

## 2021-10-02 LAB — BASIC METABOLIC PANEL
Anion gap: 8 (ref 5–15)
BUN: 24 mg/dL — ABNORMAL HIGH (ref 8–23)
CO2: 33 mmol/L — ABNORMAL HIGH (ref 22–32)
Calcium: 9 mg/dL (ref 8.9–10.3)
Chloride: 96 mmol/L — ABNORMAL LOW (ref 98–111)
Creatinine, Ser: 0.53 mg/dL — ABNORMAL LOW (ref 0.61–1.24)
GFR, Estimated: >60 mL/min (ref >60)
Glucose, Bld: 100 mg/dL — ABNORMAL HIGH (ref 70–99)
Potassium: 4.8 mmol/L (ref 3.5–5.1)
Sodium: 137 mmol/L (ref 135–145)

## 2021-10-02 LAB — MAGNESIUM: Magnesium: 1.9 mg/dL (ref 1.7–2.4)

## 2021-10-02 MED ORDER — JUVEN PO PACK
1.0000 | PACK | Freq: Two times a day (BID) | ORAL | Status: DC
  Administered 2021-10-03 – 2021-10-06 (×8): 1
  Filled 2021-10-02 (×8): qty 1
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
## 2021-10-02 NOTE — Progress Notes (Signed)
Inpatient Rehab Admissions Coordinator:   Notified by Bernadene Bell of request for peer to peer this AM.  Margie Billet, PA-C completed.  Request for CIR was denied, unclear whether because they felt pt doing too well for rehab, or not doing well enough.  Updated family, and they would like to pursue expedited appeal, which I will start today.    Shann Medal, PT, DPT Admissions Coordinator [phone] 10/02/21  1:00 PM
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
## 2021-10-02 NOTE — Progress Notes (Signed)
Patient ID: Mark [name], male   DOB: 1955/10/18, 66 y.o.   MRN: [id number] BP 135/65 (BP Location: Left Arm)    Pulse 75    Temp 97.9 F (36.6 C) (Oral)    Resp 20    Ht 6' (1.829 m)    Wt 53 kg    SpO2 96%    BMI 15.85 kg/m  Agree with rehab No new recommendations

## 2021-10-03 LAB — GLUCOSE, CAPILLARY
Glucose-Capillary: 136 mg/dL — ABNORMAL HIGH (ref 70–99)
Glucose-Capillary: 144 mg/dL — ABNORMAL HIGH (ref 70–99)
Glucose-Capillary: 179 mg/dL — ABNORMAL HIGH (ref 70–99)
Glucose-Capillary: 94 mg/dL (ref 70–99)
Glucose-Capillary: 182 mg/dL — ABNORMAL HIGH (ref 70–99)
Glucose-Capillary: 136 mg/dL — ABNORMAL HIGH (ref 70–99)

## 2021-10-03 LAB — CBC
HCT: 31 % — ABNORMAL LOW (ref 39.0–52.0)
Hemoglobin: 9.5 g/dL — ABNORMAL LOW (ref 13.0–17.0)
MCH: 28.7 pg (ref 26.0–34.0)
MCHC: 30.6 g/dL (ref 30.0–36.0)
MCV: 93.7 fL (ref 80.0–100.0)
Platelets: 586 10*3/uL — ABNORMAL HIGH (ref 150–400)
RBC: 3.31 MIL/uL — ABNORMAL LOW (ref 4.22–5.81)
RDW: 15.9 % — ABNORMAL HIGH (ref 11.5–15.5)
WBC: 8.6 10*3/uL (ref 4.0–10.5)
nRBC: 0 % (ref 0.0–0.2)

## 2021-10-03 LAB — BASIC METABOLIC PANEL
Anion gap: 6 (ref 5–15)
BUN: 25 mg/dL — ABNORMAL HIGH (ref 8–23)
CO2: 34 mmol/L — ABNORMAL HIGH (ref 22–32)
Calcium: 9.1 mg/dL (ref 8.9–10.3)
Chloride: 94 mmol/L — ABNORMAL LOW (ref 98–111)
Creatinine, Ser: 0.55 mg/dL — ABNORMAL LOW (ref 0.61–1.24)
GFR, Estimated: >60 mL/min (ref >60)
Glucose, Bld: 184 mg/dL — ABNORMAL HIGH (ref 70–99)
Potassium: 5.7 mmol/L — ABNORMAL HIGH (ref 3.5–5.1)
Sodium: 134 mmol/L — ABNORMAL LOW (ref 135–145)

## 2021-10-03 LAB — PHOSPHORUS: Phosphorus: 3.4 mg/dL (ref 2.5–4.6)

## 2021-10-03 LAB — MAGNESIUM: Magnesium: 1.9 mg/dL (ref 1.7–2.4)

## 2021-10-03 LAB — POTASSIUM: Potassium: 4.5 mmol/L (ref 3.5–5.1)

## 2021-10-03 NOTE — Progress Notes (Signed)
Patient ID: Mark [name], male   DOB: 04/01/1956, 66 y.o.   MRN: [id number] BP 126/68 (BP Location: Left Arm)    Pulse 85    Temp 97.7 F (36.5 C) (Oral)    Resp 16    Ht 6' (1.829 m)    Wt 51.7 kg    SpO2 94%    BMI 15.46 kg/m  Awaiting placement No neuro changes, wound is healing well
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
## 2021-10-04 LAB — GLUCOSE, CAPILLARY
Glucose-Capillary: 198 mg/dL — ABNORMAL HIGH (ref 70–99)
Glucose-Capillary: 163 mg/dL — ABNORMAL HIGH (ref 70–99)
Glucose-Capillary: 192 mg/dL — ABNORMAL HIGH (ref 70–99)
Glucose-Capillary: 98 mg/dL (ref 70–99)
Glucose-Capillary: 105 mg/dL — ABNORMAL HIGH (ref 70–99)
Glucose-Capillary: 197 mg/dL — ABNORMAL HIGH (ref 70–99)
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
## 2021-10-04 NOTE — TOC CAGE-AID Note (Signed)
Transition of Care [facility]) - CAGE-AID Screening   Patient Details  Name: RENSO SWETT MRN: [id number] Date of Birth: 05/19/1956  Transition of Care [facility]) CM/SW Contact:    Gaetano Hawthorne Tarpley-Carter, [place] Phone Number: 10/04/2021, 2:58 PM   Clinical Narrative: Pt participated in [place].  Pt stated she does not use substance (cigarettes).  Pt was offered resources, due to usage of substance.     Nicolai Labonte Tarpley-Carter, MSW, LCSW-A Pronouns:  She/Her/Hers [place] Transitions of Care Clinical Social Worker Direct Number:  [phone] [email or web address]  CAGE-AID Screening:    Have You Ever Felt You Ought to Cut Down on Your Drinking or Drug Use?: No Have People Annoyed You By [employer] Your Drinking Or Drug Use?: No Have You Felt Bad Or Guilty About Your Drinking Or Drug Use?: No Have You Ever Had a Drink or Used Drugs First Thing In The Morning to Steady Your Nerves or to Get Rid of a Hangover?: No CAGE-AID Score: 0  Substance Abuse Education Offered: Yes  Substance abuse interventions: Scientist, clinical (histocompatibility and immunogenetics)

## 2021-10-04 NOTE — TOC CAGE-AID Note (Deleted)
Transition of Care [facility]) - CAGE-AID Screening   Patient Details  Name: TRESTAN VAHLE MRN: [id number] Date of Birth: 1955-10-09  Transition of Care [facility]) CM/SW Contact:    Trenace Coughlin C Tarpley-Carter, [place] Phone Number: 10/04/2021, 2:57 PM   Clinical Narrative: Pt is unable to participate in Cage Aid. Pt was not alert and oriented.  CSW will assess at a better time.  Izora Benn Tarpley-Carter, MSW, LCSW-A Pronouns:  She/Her/Hers [place] Transitions of Care Clinical Social Worker Direct Number:  [phone] [email or web address]  CAGE-AID Screening: Substance Abuse Screening unable to be completed due to: : Patient unable to participate  Have You Ever Felt You Ought to Cut Down on Your Drinking or Drug Use?: No Have People Annoyed You By Critizing Your Drinking Or Drug Use?: No Have You Felt Bad Or Guilty About Your Drinking Or Drug Use?: No Have You Ever Had a Drink or Used Drugs First Thing In The Morning to Steady Your Nerves or to Get Rid of a Hangover?: No CAGE-AID Score: 0  Substance Abuse Education Offered: Yes  Substance abuse interventions: Scientist, clinical (histocompatibility and immunogenetics)
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
## 2021-10-04 NOTE — Progress Notes (Signed)
Inpatient Rehab Admissions Coordinator:   I have a bed for this pt to admit to CIR on Saturday (2/18).  [employer], PA-C in agreement.  Rehab MD to assess pt and confirm admission on Saturday.  Floor RN can call CIR at [phone] for report after 12pm on Saturday.  I have let pt/family and case manager know.    Shann Medal, PT, DPT Admissions Coordinator [phone] 10/04/21  9:59 AM

## 2021-10-04 NOTE — TOC Progression Note (Signed)
Transition of Care [facility]) - Progression Note    Patient Details  Name: Mark [name] MRN: [id number] Date of Birth: May 14, 1956  Transition of Care [facility]) CM/SW Contact  Oren Section Cleta Alberts, RN Phone Number: 10/04/2021, 1:07 PM  Clinical Narrative:    Patient approved for inpatient rehab by insurance; plan dc to CIR on Saturday, 10/05/2021, per rehab [facility] arrangements.     Expected Discharge Plan: IP Rehab Facility Barriers to Discharge: Continued Medical Work up  Expected Discharge Plan and Services Expected Discharge Plan: [place]     Post Acute Care Choice: IP Rehab Living arrangements for the past 2 months: Single Family Home                                       Social Determinants of Health (SDOH) Interventions    Readmission Risk Interventions No flowsheet data found.  Reinaldo Raddle, RN, BSN  Trauma/Neuro ICU Case Manager [phone]
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
## 2021-10-05 LAB — GLUCOSE, CAPILLARY
Glucose-Capillary: 101 mg/dL — ABNORMAL HIGH (ref 70–99)
Glucose-Capillary: 132 mg/dL — ABNORMAL HIGH (ref 70–99)
Glucose-Capillary: 148 mg/dL — ABNORMAL HIGH (ref 70–99)
Glucose-Capillary: 201 mg/dL — ABNORMAL HIGH (ref 70–99)
Glucose-Capillary: 209 mg/dL — ABNORMAL HIGH (ref 70–99)
Glucose-Capillary: 219 mg/dL — ABNORMAL HIGH (ref 70–99)

## 2021-10-05 MED ORDER — OXYCODONE HCL 5 MG PO TABS
15.0000 mg | ORAL_TABLET | ORAL | Status: DC | PRN
  Administered 2021-10-05 – 2021-10-06 (×7): 15 mg
  Filled 2021-10-05 (×6): qty 3

## 2021-10-05 NOTE — Progress Notes (Signed)
Trauma Event Note   TRN rounded on patient. RT at bedside with breathing treatment. Pt stable at this time, VS WDL. No needs at this time.   Last imported Vital Signs BP 123/62 (BP Location: Right Arm)    Pulse 80    Temp (!) 97.5 F (36.4 C) (Oral)    Resp 10    Ht 6' (1.829 m)    Wt 109 lb 12.6 oz (49.8 kg)    SpO2 95%    BMI 14.89 kg/m   Trending CBC Recent Labs    10/03/21 0505  WBC 8.6  HGB 9.5*  HCT 31.0*  PLT 586*    Trending Coag's No results for input(s): APTT, INR in the last 72 hours.  Trending BMET Recent Labs    10/03/21 0505 10/03/21 0950  NA 134*  --   K 5.7* 4.5  CL 94*  --   CO2 34*  --   BUN 25*  --   CREATININE 0.55*  --   GLUCOSE 184*  --       Mark [name] Mark [name]  Trauma Response RN  Please call TRN at [phone] for further assistance.
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
## 2021-10-06 ENCOUNTER — Other Ambulatory Visit: Payer: Self-pay

## 2021-10-06 ENCOUNTER — Inpatient Hospital Stay (HOSPITAL_COMMUNITY)
Admission: RE | Admit: 2021-10-06 | Payer: Medicare Other | Source: Intra-hospital | Admitting: Physical Medicine & Rehabilitation

## 2021-10-06 ENCOUNTER — Inpatient Hospital Stay (HOSPITAL_COMMUNITY)
Admission: RE | Admit: 2021-10-06 | Discharge: 2021-10-08 | DRG: 945 | Disposition: A | Discharge status: Other Acute Inpatient Hospital | Payer: Medicare Other | Source: Intra-hospital | Attending: Physical Medicine & Rehabilitation | Admitting: Physical Medicine & Rehabilitation

## 2021-10-06 ENCOUNTER — Encounter (HOSPITAL_COMMUNITY): Payer: Self-pay | Admitting: Physical Medicine & Rehabilitation

## 2021-10-06 DIAGNOSIS — S2243XD Multiple fractures of ribs, bilateral, subsequent encounter for fracture with routine healing: Secondary | ICD-10-CM

## 2021-10-06 DIAGNOSIS — Z9049 Acquired absence of other specified parts of digestive tract: Secondary | ICD-10-CM

## 2021-10-06 DIAGNOSIS — R64 Cachexia: Secondary | ICD-10-CM | POA: Diagnosis present

## 2021-10-06 DIAGNOSIS — Z79891 Long term (current) use of opiate analgesic: Secondary | ICD-10-CM

## 2021-10-06 DIAGNOSIS — Z681 Body mass index (BMI) 19 or less, adult: Secondary | ICD-10-CM

## 2021-10-06 DIAGNOSIS — T07XXXA Unspecified multiple injuries, initial encounter: Secondary | ICD-10-CM | POA: Diagnosis present

## 2021-10-06 DIAGNOSIS — Z981 Arthrodesis status: Secondary | ICD-10-CM | POA: Diagnosis not present

## 2021-10-06 DIAGNOSIS — K5900 Constipation, unspecified: Secondary | ICD-10-CM | POA: Diagnosis present

## 2021-10-06 DIAGNOSIS — S72141D Displaced intertrochanteric fracture of right femur, subsequent encounter for closed fracture with routine healing: Secondary | ICD-10-CM | POA: Diagnosis present

## 2021-10-06 DIAGNOSIS — G8929 Other chronic pain: Secondary | ICD-10-CM | POA: Diagnosis present

## 2021-10-06 DIAGNOSIS — F419 Anxiety disorder, unspecified: Secondary | ICD-10-CM | POA: Diagnosis present

## 2021-10-06 DIAGNOSIS — I9589 Other hypotension: Secondary | ICD-10-CM | POA: Diagnosis present

## 2021-10-06 DIAGNOSIS — F17210 Nicotine dependence, cigarettes, uncomplicated: Secondary | ICD-10-CM | POA: Diagnosis present

## 2021-10-06 DIAGNOSIS — S065XAD Traumatic subdural hemorrhage with loss of consciousness status unknown, subsequent encounter: Secondary | ICD-10-CM | POA: Diagnosis not present

## 2021-10-06 DIAGNOSIS — J690 Pneumonitis due to inhalation of food and vomit: Secondary | ICD-10-CM | POA: Diagnosis not present

## 2021-10-06 DIAGNOSIS — F32A Depression, unspecified: Secondary | ICD-10-CM | POA: Diagnosis present

## 2021-10-06 DIAGNOSIS — Z885 Allergy status to narcotic agent status: Secondary | ICD-10-CM | POA: Diagnosis not present

## 2021-10-06 DIAGNOSIS — J440 Chronic obstructive pulmonary disease with acute lower respiratory infection: Secondary | ICD-10-CM | POA: Diagnosis not present

## 2021-10-06 DIAGNOSIS — I4891 Unspecified atrial fibrillation: Secondary | ICD-10-CM | POA: Diagnosis present

## 2021-10-06 DIAGNOSIS — Z716 Tobacco abuse counseling: Secondary | ICD-10-CM

## 2021-10-06 DIAGNOSIS — R6521 Severe sepsis with septic shock: Secondary | ICD-10-CM | POA: Diagnosis not present

## 2021-10-06 DIAGNOSIS — R1310 Dysphagia, unspecified: Secondary | ICD-10-CM | POA: Diagnosis present

## 2021-10-06 DIAGNOSIS — Z79899 Other long term (current) drug therapy: Secondary | ICD-10-CM | POA: Diagnosis not present

## 2021-10-06 DIAGNOSIS — A419 Sepsis, unspecified organism: Secondary | ICD-10-CM | POA: Diagnosis not present

## 2021-10-06 DIAGNOSIS — Z8589 Personal history of malignant neoplasm of other organs and systems: Secondary | ICD-10-CM | POA: Diagnosis not present

## 2021-10-06 DIAGNOSIS — J155 Pneumonia due to Escherichia coli: Secondary | ICD-10-CM | POA: Diagnosis not present

## 2021-10-06 DIAGNOSIS — R636 Underweight: Secondary | ICD-10-CM | POA: Diagnosis present

## 2021-10-06 DIAGNOSIS — R6520 Severe sepsis without septic shock: Secondary | ICD-10-CM | POA: Diagnosis not present

## 2021-10-06 DIAGNOSIS — E785 Hyperlipidemia, unspecified: Secondary | ICD-10-CM | POA: Diagnosis present

## 2021-10-06 DIAGNOSIS — J9601 Acute respiratory failure with hypoxia: Secondary | ICD-10-CM | POA: Diagnosis not present

## 2021-10-06 LAB — GLUCOSE, CAPILLARY
Glucose-Capillary: 184 mg/dL — ABNORMAL HIGH (ref 70–99)
Glucose-Capillary: 163 mg/dL — ABNORMAL HIGH (ref 70–99)
Glucose-Capillary: 145 mg/dL — ABNORMAL HIGH (ref 70–99)
Glucose-Capillary: 106 mg/dL — ABNORMAL HIGH (ref 70–99)
Glucose-Capillary: 158 mg/dL — ABNORMAL HIGH (ref 70–99)

## 2021-10-06 MED ORDER — CLONAZEPAM 0.5 MG PO TABS
0.5000 mg | ORAL_TABLET | Freq: Every day | ORAL | Status: DC
  Administered 2021-10-06 – 2021-10-07 (×2): 0.5 mg
  Filled 2021-10-06 (×2): qty 1

## 2021-10-06 MED ORDER — VENLAFAXINE HCL 37.5 MG PO TABS
37.5000 mg | ORAL_TABLET | Freq: Two times a day (BID) | ORAL | Status: DC
  Administered 2021-10-06 – 2021-10-08 (×4): 37.5 mg via NASOGASTRIC
  Filled 2021-10-06 (×5): qty 1

## 2021-10-06 MED ORDER — ENOXAPARIN SODIUM 40 MG/0.4ML IJ SOSY: 40.0000 mg | PREFILLED_SYRINGE | INTRAMUSCULAR | Status: DC

## 2021-10-06 MED ORDER — ARIPIPRAZOLE 2 MG PO TABS
2.0000 mg | ORAL_TABLET | Freq: Every day | ORAL | Status: DC
  Filled 2021-10-06: qty 1

## 2021-10-06 MED ORDER — NICOTINE 14 MG/24HR TD PT24
14.0000 mg | MEDICATED_PATCH | Freq: Every day | TRANSDERMAL | Status: DC
  Administered 2021-10-07 – 2021-10-08 (×2): 14 mg via TRANSDERMAL
  Filled 2021-10-06 (×2): qty 1

## 2021-10-06 MED ORDER — SENNA 8.6 MG PO TABS: 1.0000 | ORAL_TABLET | Freq: Two times a day (BID) | ORAL | Status: DC

## 2021-10-06 MED ORDER — CLONAZEPAM 0.5 MG PO TABS: 0.5000 mg | ORAL_TABLET | Freq: Every day | ORAL | Status: DC

## 2021-10-06 MED ORDER — OXYCODONE HCL 5 MG PO TABS
15.0000 mg | ORAL_TABLET | ORAL | Status: DC | PRN
  Administered 2021-10-06 – 2021-10-07 (×4): 15 mg via ORAL
  Filled 2021-10-06 (×5): qty 3

## 2021-10-06 MED ORDER — ALBUTEROL SULFATE (2.5 MG/3ML) 0.083% IN NEBU: 2.5000 mg | INHALATION_SOLUTION | Freq: Four times a day (QID) | RESPIRATORY_TRACT | Status: DC | PRN

## 2021-10-06 MED ORDER — TRAZODONE HCL 50 MG PO TABS
150.0000 mg | ORAL_TABLET | Freq: Every day | ORAL | Status: DC
Start: 2021-10-06 — End: 2021-10-08
  Administered 2021-10-06 – 2021-10-07 (×2): 150 mg via NASOGASTRIC
  Filled 2021-10-06 (×2): qty 3

## 2021-10-06 MED ORDER — GABAPENTIN 250 MG/5ML PO SOLN
400.0000 mg | Freq: Three times a day (TID) | ORAL | Status: DC
  Administered 2021-10-06 – 2021-10-08 (×5): 400 mg
  Filled 2021-10-06 (×9): qty 8

## 2021-10-06 MED ORDER — METHOCARBAMOL 500 MG PO TABS: 500.0000 mg | ORAL_TABLET | Freq: Three times a day (TID) | ORAL | Status: DC | PRN

## 2021-10-06 MED ORDER — BISACODYL 10 MG RE SUPP: 10.0000 mg | Freq: Every day | RECTAL | Status: DC | PRN

## 2021-10-06 MED ORDER — UMECLIDINIUM BROMIDE 62.5 MCG/ACT IN AEPB
1.0000 | INHALATION_SPRAY | Freq: Every day | RESPIRATORY_TRACT | Status: DC
  Administered 2021-10-07 – 2021-10-08 (×2): 1 via RESPIRATORY_TRACT
  Filled 2021-10-06: qty 7

## 2021-10-06 MED ORDER — SENNA 8.6 MG PO TABS
1.0000 | ORAL_TABLET | Freq: Two times a day (BID) | ORAL | Status: DC
  Administered 2021-10-07 – 2021-10-08 (×3): 8.6 mg
  Filled 2021-10-06 (×4): qty 1

## 2021-10-06 MED ORDER — POLYETHYLENE GLYCOL 3350 17 G PO PACK: 17.0000 g | PACK | Freq: Every day | ORAL | Status: DC | PRN

## 2021-10-06 MED ORDER — OSMOLITE 1.5 CAL PO LIQD
1000.0000 mL | ORAL | Status: DC
  Administered 2021-10-07: 1000 mL
  Filled 2021-10-06: qty 1000

## 2021-10-06 MED ORDER — PANTOPRAZOLE SODIUM 40 MG PO TBEC: 40.0000 mg | DELAYED_RELEASE_TABLET | Freq: Every day | ORAL | Status: DC

## 2021-10-06 MED ORDER — ONDANSETRON HCL 4 MG/2ML IJ SOLN: 4.0000 mg | Freq: Four times a day (QID) | INTRAMUSCULAR | Status: DC | PRN

## 2021-10-06 MED ORDER — ENOXAPARIN SODIUM 40 MG/0.4ML IJ SOSY
40.0000 mg | PREFILLED_SYRINGE | INTRAMUSCULAR | Status: DC
  Administered 2021-10-06 – 2021-10-07 (×2): 40 mg via SUBCUTANEOUS
  Filled 2021-10-06 (×2): qty 0.4

## 2021-10-06 MED ORDER — MIDODRINE HCL 5 MG PO TABS
5.0000 mg | ORAL_TABLET | Freq: Three times a day (TID) | ORAL | Status: DC
  Administered 2021-10-07 – 2021-10-08 (×5): 5 mg
  Filled 2021-10-06 (×5): qty 1

## 2021-10-06 MED ORDER — PRAVASTATIN SODIUM 10 MG PO TABS
20.0000 mg | ORAL_TABLET | Freq: Every day | ORAL | Status: DC
  Administered 2021-10-06 – 2021-10-07 (×2): 20 mg via NASOGASTRIC
  Filled 2021-10-06 (×2): qty 2

## 2021-10-06 MED ORDER — ONDANSETRON HCL 4 MG PO TABS: 4.0000 mg | ORAL_TABLET | Freq: Four times a day (QID) | ORAL | Status: DC | PRN

## 2021-10-06 MED ORDER — PROSOURCE TF PO LIQD
45.0000 mL | Freq: Two times a day (BID) | ORAL | Status: DC
  Administered 2021-10-06 – 2021-10-07 (×3): 45 mL
  Filled 2021-10-06 (×3): qty 45

## 2021-10-06 MED ORDER — BUDESONIDE 0.5 MG/2ML IN SUSP
0.5000 mg | Freq: Two times a day (BID) | RESPIRATORY_TRACT | Status: DC
  Administered 2021-10-06 – 2021-10-08 (×4): 0.5 mg via RESPIRATORY_TRACT
  Filled 2021-10-06 (×5): qty 2

## 2021-10-06 MED ORDER — GUAIFENESIN 100 MG/5ML PO LIQD: 5.0000 mL | ORAL | Status: DC | PRN

## 2021-10-06 MED ORDER — GABAPENTIN 250 MG/5ML PO SOLN
400.0000 mg | Freq: Three times a day (TID) | ORAL | Status: DC
  Filled 2021-10-06 (×2): qty 8

## 2021-10-06 MED ORDER — MIDODRINE HCL 5 MG PO TABS
5.0000 mg | ORAL_TABLET | Freq: Three times a day (TID) | ORAL | Status: DC
  Administered 2021-10-06: 5 mg via ORAL
  Filled 2021-10-06: qty 1

## 2021-10-06 MED ORDER — PANTOPRAZOLE 2 MG/ML SUSPENSION
40.0000 mg | Freq: Every day | ORAL | Status: DC
  Administered 2021-10-07 – 2021-10-08 (×2): 40 mg
  Filled 2021-10-06 (×2): qty 20

## 2021-10-06 NOTE — Progress Notes (Addendum)
Inpatient Rehabilitation Admission Medication Review by a Pharmacist  A complete drug regimen review was completed for this patient to identify any potential clinically significant medication issues.  High Risk Drug Classes Is patient taking? Indication by Medication  Antipsychotic Yes Aripiprazole - antipyschotic  Anticoagulant Yes   Antibiotic No   Opioid Yes Oxycodone - pain  Antiplatelet No Enoxaparin - VTE prophylaxis  Hypoglycemics/insulin No   Vasoactive Medication Yes Midodrine - blood pressure support  Chemotherapy No   Other Yes Budesonide nebs - steroid Pantoprazole - GERD Pravastatin - hyperlipidemia Trazodone - sleep Venlafaxine - mood Clonazepam - anxiety Gabapentin - neuropathic pain Nicotine patch - smoking cessation Senokot BID and prn Miralax or Bisacodyl- laxatives     Type of Medication Issue Identified Description of Issue Recommendation(s)  Additional Drug Therapy Needed  On as inpatient but not yet ordered on transfer (all per tube): Brovana nebs BID + Incruse daily (for PTA Stiolto Respimat), Tylenol 1gm q6h, Methocarbamol 1gm q6h, Vitamin D 2000 units daily, Loratadine 10 mg daily, and moderate SSI q4h for tube feeding coverage.  Also Docusate 200 mg BID and Miralax BID, but has laxatives ordered Resume/continue Brovana + Incruse, tylenol, Vitamin D, Loratadine and SSI. - Methocarbamol is currently ordered 500 mg q6h prn muscle spasms. Note indicates plan for 1gm q8h  Significant med changes from prior encounter (inform family/care partners about these prior to discharge). Pregabalin 150 mg PO BID > changed to Gabapentin 400 mg per tube q8h. Unable to use Tamsulosin while NPO and meds are being give per tube.  Nicotine patch 21 mg PTA > has been on 14 mg since 09/20/21. Discuss at discharge if necessary.  Other  FYI: Carvedilol 3.125 mg BID was started on 09/23/21 and stopped on 10/05/21.  On Midodrine for blood pressure support.     Clinically significant  medication issues were identified that warrant physician communication and completion of prescribed/recommended actions by midnight of the next day:  Yes  Name of provider notified for urgent issues identified: Dr. Letta Pate  Provider Method of Notification:  secure chat  Pharmacist comments:    Chart to be merged with inpatient chart (MRN: [id number])    Multiple discrepancies between CIR chart and meds just prior to transfer.    -  Lovenox 30 mg SQ q12h > now 40 mg SQ Q24h.     - Trazodone 150 mg qhs was discontinued on 2/14 due to hypotension. Resuming tonight.  Monitor.      - Was on Guafenesin 10 mg q6h schedule on inpatient side, now 5 ml q6h prn cough/ to loosen phlegm. Monitor.     Not ordered on transfer yet:         - Brovana BID + Incruse daily for Stiolto Respimat.  Was also on Yupleri nebs daily, added 2/8, duplicate with Incruse, so may not be needed.       - moderate SSI q4h for tube feeding coverage       - Tylenol, Methocarbamol, Vitamin D and Loratadine as above  Time spent performing this drug regimen review (minutes):  [address]   Arty Baumgartner, [place] 10/06/2021 8:19 PM
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
## 2021-10-06 NOTE — Progress Notes (Signed)
Pt discharged to CIR, and transferred to 4W07. Pt's wife and niece at bedside. All belongings packed and sent with pt.   Justice Rocher, RN
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
## 2021-10-06 NOTE — Discharge Instructions (Signed)
Inpatient Rehab Discharge Instructions  DEAVEON SCHOEN Discharge date and time: No discharge date for patient encounter.   Activities/Precautions/ Functional Status: Activity: As tolerated Diet:  Wound Care: Routine skin checks Functional status:  ___ No restrictions     ___ Walk up steps independently ___ 24/7 supervision/assistance   ___ Walk up steps with assistance ___ Intermittent supervision/assistance  _x__ Bathe/dress independently ___ Walk with walker     ___ Bathe/dress with assistance ___ Walk Independently    ___ Shower independently ___ Walk with assistance    ___ Shower with assistance ___ No alcohol     ___ Return to work/school ________  Special Instructions:  No driving smoking or alcohol  My questions have been answered and I understand these instructions. I will adhere to these goals and the provided educational materials after my discharge from the hospital.  Patient/Caregiver Signature _______________________________ Date __________  Clinician Signature _______________________________________ Date __________  Please bring this form and your medication list with you to all your follow-up doctor's appointments.
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
## 2021-10-06 NOTE — Progress Notes (Signed)
Patient ID: Mark [name], male   DOB: [birth year], 66 y.o.   MRN: [id number]  INPATIENT REHABILITATION ADMISSION NOTE   Arrival Method: wheelchair     Mental Orientation: A&O x4   Assessment: completed per flowsheet    Skin: intact except for scabs on Right Leg and MASD to perianal area   IV'S: 1 20  Right FA   Pain: 10/10 pain per flowsheet assessment   Tubes and Drains: none   Safety Measures: bed alarm on and Call dont fall policy reviewed    Vital Signs: Completed per flowsheet    Height and Weight: 6'0  /50.2 kg    Rehab Orientation: completed    Family:    Notes:

## 2021-10-07 ENCOUNTER — Encounter (HOSPITAL_COMMUNITY): Payer: Self-pay | Admitting: Physical Medicine & Rehabilitation

## 2021-10-07 LAB — GLUCOSE, CAPILLARY
Glucose-Capillary: 127 mg/dL — ABNORMAL HIGH (ref 70–99)
Glucose-Capillary: 140 mg/dL — ABNORMAL HIGH (ref 70–99)
Glucose-Capillary: 149 mg/dL — ABNORMAL HIGH (ref 70–99)
Glucose-Capillary: 168 mg/dL — ABNORMAL HIGH (ref 70–99)
Glucose-Capillary: 181 mg/dL — ABNORMAL HIGH (ref 70–99)
Glucose-Capillary: 213 mg/dL — ABNORMAL HIGH (ref 70–99)

## 2021-10-07 MED ORDER — ONDANSETRON HCL 4 MG PO TABS: 4.0000 mg | ORAL_TABLET | Freq: Four times a day (QID) | ORAL | Status: DC | PRN

## 2021-10-07 MED ORDER — ARIPIPRAZOLE 2 MG PO TABS
2.0000 mg | ORAL_TABLET | Freq: Every day | ORAL | Status: DC
  Administered 2021-10-07 – 2021-10-08 (×2): 2 mg
  Filled 2021-10-07: qty 1

## 2021-10-07 MED ORDER — ONDANSETRON HCL 4 MG/2ML IJ SOLN: 4.0000 mg | Freq: Four times a day (QID) | INTRAMUSCULAR | Status: DC | PRN

## 2021-10-07 MED ORDER — METHOCARBAMOL 500 MG PO TABS
500.0000 mg | ORAL_TABLET | Freq: Three times a day (TID) | ORAL | Status: DC | PRN
  Administered 2021-10-07 – 2021-10-08 (×2): 500 mg
  Filled 2021-10-07 (×2): qty 1

## 2021-10-07 MED ORDER — OSMOLITE 1.5 CAL PO LIQD: 1000.0000 mL | ORAL | Status: DC

## 2021-10-07 MED ORDER — FREE WATER
200.0000 mL | Freq: Four times a day (QID) | Status: DC
  Administered 2021-10-07: 200 mL

## 2021-10-07 MED ORDER — OXYCODONE HCL 5 MG PO TABS
15.0000 mg | ORAL_TABLET | ORAL | Status: DC | PRN
  Administered 2021-10-07 – 2021-10-08 (×7): 15 mg
  Filled 2021-10-07 (×6): qty 3

## 2021-10-07 MED ORDER — POLYETHYLENE GLYCOL 3350 17 G PO PACK: 17.0000 g | PACK | Freq: Every day | ORAL | Status: DC | PRN

## 2021-10-07 MED ORDER — GUAIFENESIN 100 MG/5ML PO LIQD: 5.0000 mL | ORAL | Status: DC | PRN
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
## 2021-10-07 NOTE — Progress Notes (Signed)
Inpatient Rehabilitation  Patient information reviewed and entered into eRehab system by Lenita Peregrina M. Otillia Cordone, M.A., CCC/SLP, PPS Coordinator.  Information including medical coding, functional ability and quality indicators will be reviewed and updated through discharge.    
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
## 2021-10-07 NOTE — Progress Notes (Signed)
Assigned nurse ask writer to assess patient Cortrax tube due to reported difficulty in getting TF to flow by prior off going nurse. Several attempts made by Probation officer and unsuccessful. Writer instructed to assigned nurse  to notify on call for additional orders.

## 2021-10-08 ENCOUNTER — Inpatient Hospital Stay (HOSPITAL_COMMUNITY): Payer: Medicare Other

## 2021-10-08 ENCOUNTER — Inpatient Hospital Stay (HOSPITAL_COMMUNITY)
Admission: RE | Admit: 2021-10-08 | Discharge: 2021-11-16 | DRG: 853 | Disposition: E | Payer: Medicare Other | Source: Other Acute Inpatient Hospital | Attending: Internal Medicine | Admitting: Internal Medicine

## 2021-10-08 DIAGNOSIS — J80 Acute respiratory distress syndrome: Secondary | ICD-10-CM | POA: Diagnosis present

## 2021-10-08 DIAGNOSIS — E875 Hyperkalemia: Secondary | ICD-10-CM | POA: Diagnosis present

## 2021-10-08 DIAGNOSIS — T07XXXA Unspecified multiple injuries, initial encounter: Secondary | ICD-10-CM | POA: Diagnosis not present

## 2021-10-08 DIAGNOSIS — R6521 Severe sepsis with septic shock: Secondary | ICD-10-CM | POA: Diagnosis present

## 2021-10-08 DIAGNOSIS — S72141D Displaced intertrochanteric fracture of right femur, subsequent encounter for closed fracture with routine healing: Secondary | ICD-10-CM | POA: Diagnosis not present

## 2021-10-08 DIAGNOSIS — I63441 Cerebral infarction due to embolism of right cerebellar artery: Secondary | ICD-10-CM | POA: Diagnosis not present

## 2021-10-08 DIAGNOSIS — R1310 Dysphagia, unspecified: Secondary | ICD-10-CM | POA: Diagnosis present

## 2021-10-08 DIAGNOSIS — K7200 Acute and subacute hepatic failure without coma: Secondary | ICD-10-CM | POA: Diagnosis not present

## 2021-10-08 DIAGNOSIS — A403 Sepsis due to Streptococcus pneumoniae: Secondary | ICD-10-CM | POA: Diagnosis present

## 2021-10-08 DIAGNOSIS — I480 Paroxysmal atrial fibrillation: Secondary | ICD-10-CM | POA: Diagnosis present

## 2021-10-08 DIAGNOSIS — Z923 Personal history of irradiation: Secondary | ICD-10-CM

## 2021-10-08 DIAGNOSIS — Z9981 Dependence on supplemental oxygen: Secondary | ICD-10-CM

## 2021-10-08 DIAGNOSIS — Z981 Arthrodesis status: Secondary | ICD-10-CM

## 2021-10-08 DIAGNOSIS — J9690 Respiratory failure, unspecified, unspecified whether with hypoxia or hypercapnia: Secondary | ICD-10-CM

## 2021-10-08 DIAGNOSIS — E1142 Type 2 diabetes mellitus with diabetic polyneuropathy: Secondary | ICD-10-CM | POA: Diagnosis present

## 2021-10-08 DIAGNOSIS — S065XAD Traumatic subdural hemorrhage with loss of consciousness status unknown, subsequent encounter: Secondary | ICD-10-CM | POA: Diagnosis not present

## 2021-10-08 DIAGNOSIS — Z515 Encounter for palliative care: Secondary | ICD-10-CM

## 2021-10-08 DIAGNOSIS — E1165 Type 2 diabetes mellitus with hyperglycemia: Secondary | ICD-10-CM | POA: Diagnosis not present

## 2021-10-08 DIAGNOSIS — S2241XD Multiple fractures of ribs, right side, subsequent encounter for fracture with routine healing: Secondary | ICD-10-CM | POA: Diagnosis not present

## 2021-10-08 DIAGNOSIS — Z9221 Personal history of antineoplastic chemotherapy: Secondary | ICD-10-CM

## 2021-10-08 DIAGNOSIS — Z452 Encounter for adjustment and management of vascular access device: Secondary | ICD-10-CM

## 2021-10-08 DIAGNOSIS — J690 Pneumonitis due to inhalation of food and vomit: Secondary | ICD-10-CM | POA: Diagnosis present

## 2021-10-08 DIAGNOSIS — Z79899 Other long term (current) drug therapy: Secondary | ICD-10-CM

## 2021-10-08 DIAGNOSIS — E876 Hypokalemia: Secondary | ICD-10-CM | POA: Diagnosis not present

## 2021-10-08 DIAGNOSIS — E870 Hyperosmolality and hypernatremia: Secondary | ICD-10-CM | POA: Diagnosis not present

## 2021-10-08 DIAGNOSIS — J9601 Acute respiratory failure with hypoxia: Secondary | ICD-10-CM | POA: Diagnosis present

## 2021-10-08 DIAGNOSIS — E43 Unspecified severe protein-calorie malnutrition: Secondary | ICD-10-CM | POA: Diagnosis present

## 2021-10-08 DIAGNOSIS — G9341 Metabolic encephalopathy: Secondary | ICD-10-CM | POA: Diagnosis not present

## 2021-10-08 DIAGNOSIS — F419 Anxiety disorder, unspecified: Secondary | ICD-10-CM | POA: Diagnosis present

## 2021-10-08 DIAGNOSIS — J13 Pneumonia due to Streptococcus pneumoniae: Secondary | ICD-10-CM | POA: Diagnosis present

## 2021-10-08 DIAGNOSIS — Z931 Gastrostomy status: Secondary | ICD-10-CM

## 2021-10-08 DIAGNOSIS — R0902 Hypoxemia: Secondary | ICD-10-CM

## 2021-10-08 DIAGNOSIS — Z66 Do not resuscitate: Secondary | ICD-10-CM | POA: Diagnosis not present

## 2021-10-08 DIAGNOSIS — Z978 Presence of other specified devices: Secondary | ICD-10-CM

## 2021-10-08 DIAGNOSIS — I639 Cerebral infarction, unspecified: Secondary | ICD-10-CM | POA: Diagnosis not present

## 2021-10-08 DIAGNOSIS — G629 Polyneuropathy, unspecified: Secondary | ICD-10-CM | POA: Diagnosis present

## 2021-10-08 DIAGNOSIS — Z823 Family history of stroke: Secondary | ICD-10-CM

## 2021-10-08 DIAGNOSIS — R29714 NIHSS score 14: Secondary | ICD-10-CM | POA: Diagnosis not present

## 2021-10-08 DIAGNOSIS — E874 Mixed disorder of acid-base balance: Secondary | ICD-10-CM | POA: Diagnosis not present

## 2021-10-08 DIAGNOSIS — R64 Cachexia: Secondary | ICD-10-CM | POA: Diagnosis present

## 2021-10-08 DIAGNOSIS — E785 Hyperlipidemia, unspecified: Secondary | ICD-10-CM | POA: Diagnosis present

## 2021-10-08 DIAGNOSIS — N170 Acute kidney failure with tubular necrosis: Secondary | ICD-10-CM | POA: Diagnosis not present

## 2021-10-08 DIAGNOSIS — Z8043 Family history of malignant neoplasm of testis: Secondary | ICD-10-CM

## 2021-10-08 DIAGNOSIS — J439 Emphysema, unspecified: Secondary | ICD-10-CM | POA: Diagnosis present

## 2021-10-08 DIAGNOSIS — A419 Sepsis, unspecified organism: Secondary | ICD-10-CM

## 2021-10-08 DIAGNOSIS — J9622 Acute and chronic respiratory failure with hypercapnia: Secondary | ICD-10-CM | POA: Diagnosis not present

## 2021-10-08 DIAGNOSIS — I21A1 Myocardial infarction type 2: Secondary | ICD-10-CM | POA: Diagnosis not present

## 2021-10-08 DIAGNOSIS — D6489 Other specified anemias: Secondary | ICD-10-CM | POA: Diagnosis present

## 2021-10-08 DIAGNOSIS — I6340 Cerebral infarction due to embolism of unspecified cerebral artery: Secondary | ICD-10-CM | POA: Insufficient documentation

## 2021-10-08 DIAGNOSIS — Z800 Family history of malignant neoplasm of digestive organs: Secondary | ICD-10-CM

## 2021-10-08 DIAGNOSIS — Y95 Nosocomial condition: Secondary | ICD-10-CM | POA: Diagnosis not present

## 2021-10-08 DIAGNOSIS — Z885 Allergy status to narcotic agent status: Secondary | ICD-10-CM

## 2021-10-08 DIAGNOSIS — Z681 Body mass index (BMI) 19 or less, adult: Secondary | ICD-10-CM

## 2021-10-08 DIAGNOSIS — Z4659 Encounter for fitting and adjustment of other gastrointestinal appliance and device: Secondary | ICD-10-CM

## 2021-10-08 DIAGNOSIS — D75839 Thrombocytosis, unspecified: Secondary | ICD-10-CM | POA: Diagnosis not present

## 2021-10-08 DIAGNOSIS — F17210 Nicotine dependence, cigarettes, uncomplicated: Secondary | ICD-10-CM | POA: Diagnosis present

## 2021-10-08 DIAGNOSIS — I9589 Other hypotension: Secondary | ICD-10-CM | POA: Diagnosis present

## 2021-10-08 DIAGNOSIS — Z85818 Personal history of malignant neoplasm of other sites of lip, oral cavity, and pharynx: Secondary | ICD-10-CM

## 2021-10-08 DIAGNOSIS — R6520 Severe sepsis without septic shock: Secondary | ICD-10-CM | POA: Diagnosis not present

## 2021-10-08 DIAGNOSIS — J9602 Acute respiratory failure with hypercapnia: Secondary | ICD-10-CM

## 2021-10-08 DIAGNOSIS — S12200D Unspecified displaced fracture of third cervical vertebra, subsequent encounter for fracture with routine healing: Secondary | ICD-10-CM

## 2021-10-08 DIAGNOSIS — J9621 Acute and chronic respiratory failure with hypoxia: Secondary | ICD-10-CM | POA: Diagnosis not present

## 2021-10-08 DIAGNOSIS — M19042 Primary osteoarthritis, left hand: Secondary | ICD-10-CM | POA: Diagnosis present

## 2021-10-08 DIAGNOSIS — Z7951 Long term (current) use of inhaled steroids: Secondary | ICD-10-CM

## 2021-10-08 LAB — COMPREHENSIVE METABOLIC PANEL
ALT: 24 U/L (ref 0–44)
AST: 29 U/L (ref 15–41)
Albumin: 3.6 g/dL (ref 3.5–5.0)
Alkaline Phosphatase: 254 U/L — ABNORMAL HIGH (ref 38–126)
Anion gap: 11 (ref 5–15)
BUN: 38 mg/dL — ABNORMAL HIGH (ref 8–23)
CO2: 29 mmol/L (ref 22–32)
Calcium: 8.4 mg/dL — ABNORMAL LOW (ref 8.9–10.3)
Chloride: 98 mmol/L (ref 98–111)
Creatinine, Ser: 1.08 mg/dL (ref 0.61–1.24)
GFR, Estimated: >60 mL/min (ref >60)
Glucose, Bld: 156 mg/dL — ABNORMAL HIGH (ref 70–99)
Potassium: 4 mmol/L (ref 3.5–5.1)
Sodium: 138 mmol/L (ref 135–145)
Total Bilirubin: 0.8 mg/dL (ref 0.3–1.2)
Total Protein: 6.9 g/dL (ref 6.5–8.1)

## 2021-10-08 LAB — POCT I-STAT 7, (LYTES, BLD GAS, ICA,H+H)
Acid-Base Excess: 2 mmol/L (ref 0.0–2.0)
Acid-Base Excess: 2 mmol/L (ref 0.0–2.0)
Bicarbonate: 28.2 mmol/L — ABNORMAL HIGH (ref 20.0–28.0)
Bicarbonate: 28.6 mmol/L — ABNORMAL HIGH (ref 20.0–28.0)
Calcium, Ion: 1.15 mmol/L (ref 1.15–1.40)
Calcium, Ion: 1.22 mmol/L (ref 1.15–1.40)
HCT: 31 % — ABNORMAL LOW (ref 39.0–52.0)
HCT: 32 % — ABNORMAL LOW (ref 39.0–52.0)
Hemoglobin: 10.5 g/dL — ABNORMAL LOW (ref 13.0–17.0)
Hemoglobin: 10.9 g/dL — ABNORMAL LOW (ref 13.0–17.0)
O2 Saturation: 92 %
O2 Saturation: 89 %
Patient temperature: 98.9
Patient temperature: 100.8
Potassium: 4.1 mmol/L (ref 3.5–5.1)
Potassium: 3.6 mmol/L (ref 3.5–5.1)
Sodium: 137 mmol/L (ref 135–145)
Sodium: 139 mmol/L (ref 135–145)
TCO2: 30 mmol/L (ref 22–32)
TCO2: 30 mmol/L (ref 22–32)
pCO2 arterial: 53.4 mmHg — ABNORMAL HIGH (ref 32–48)
pCO2 arterial: 54.9 mmHg — ABNORMAL HIGH (ref 32–48)
pH, Arterial: 7.331 — ABNORMAL LOW (ref 7.35–7.45)
pH, Arterial: 7.33 — ABNORMAL LOW (ref 7.35–7.45)
pO2, Arterial: 72 mmHg — ABNORMAL LOW (ref 83–108)
pO2, Arterial: 65 mmHg — ABNORMAL LOW (ref 83–108)

## 2021-10-08 LAB — CBC WITH DIFFERENTIAL/PLATELET
Abs Immature Granulocytes: 0.02 10*3/uL (ref 0.00–0.07)
Basophils Absolute: 0 10*3/uL (ref 0.0–0.1)
Basophils Relative: 0 %
Eosinophils Absolute: 0 10*3/uL (ref 0.0–0.5)
Eosinophils Relative: 0 %
HCT: 34.2 % — ABNORMAL LOW (ref 39.0–52.0)
Hemoglobin: 10.4 g/dL — ABNORMAL LOW (ref 13.0–17.0)
Immature Granulocytes: 0 %
Lymphocytes Relative: 2 %
Lymphs Abs: 0.1 10*3/uL — ABNORMAL LOW (ref 0.7–4.0)
MCH: 29 pg (ref 26.0–34.0)
MCHC: 30.4 g/dL (ref 30.0–36.0)
MCV: 95.3 fL (ref 80.0–100.0)
Monocytes Absolute: 0.4 10*3/uL (ref 0.1–1.0)
Monocytes Relative: 6 %
Neutro Abs: 6.3 10*3/uL (ref 1.7–7.7)
Neutrophils Relative %: 92 %
Platelets: 648 10*3/uL — ABNORMAL HIGH (ref 150–400)
RBC: 3.59 MIL/uL — ABNORMAL LOW (ref 4.22–5.81)
RDW: 16.8 % — ABNORMAL HIGH (ref 11.5–15.5)
WBC: 7 10*3/uL (ref 4.0–10.5)
nRBC: 0 % (ref 0.0–0.2)

## 2021-10-08 LAB — BLOOD GAS, ARTERIAL
Acid-Base Excess: 8.4 mmol/L — ABNORMAL HIGH (ref 0.0–2.0)
Bicarbonate: 33.9 mmol/L — ABNORMAL HIGH (ref 20.0–28.0)
Drawn by: 28099
FIO2: 100 %
O2 Saturation: 88.3 %
Patient temperature: 37
pCO2 arterial: 51 mmHg — ABNORMAL HIGH (ref 32–48)
pH, Arterial: 7.43 (ref 7.35–7.45)
pO2, Arterial: 55 mmHg — ABNORMAL LOW (ref 83–108)

## 2021-10-08 LAB — MAGNESIUM: Magnesium: 1.7 mg/dL (ref 1.7–2.4)

## 2021-10-08 LAB — GLUCOSE, CAPILLARY
Glucose-Capillary: 104 mg/dL — ABNORMAL HIGH (ref 70–99)
Glucose-Capillary: 125 mg/dL — ABNORMAL HIGH (ref 70–99)
Glucose-Capillary: 133 mg/dL — ABNORMAL HIGH (ref 70–99)
Glucose-Capillary: 180 mg/dL — ABNORMAL HIGH (ref 70–99)

## 2021-10-08 LAB — PHOSPHORUS: Phosphorus: 3.4 mg/dL (ref 2.5–4.6)

## 2021-10-08 LAB — PROTIME-INR
INR: 1.1 (ref 0.8–1.2)
Prothrombin Time: 14.6 seconds (ref 11.4–15.2)

## 2021-10-08 LAB — MRSA NEXT GEN BY PCR, NASAL: MRSA by PCR Next Gen: NOT DETECTED

## 2021-10-08 LAB — CORTISOL: Cortisol, Plasma: 45.5 ug/dL

## 2021-10-08 MED ORDER — SODIUM CHLORIDE 0.9 % IV SOLN
2.0000 g | Freq: Three times a day (TID) | INTRAVENOUS | Status: DC
  Filled 2021-10-08 (×2): qty 2

## 2021-10-08 MED ORDER — DOCUSATE SODIUM 50 MG/5ML PO LIQD
100.0000 mg | Freq: Two times a day (BID) | ORAL | Status: DC
  Administered 2021-10-08 – 2021-10-13 (×5): 100 mg
  Filled 2021-10-08 (×6): qty 10

## 2021-10-08 MED ORDER — METRONIDAZOLE 500 MG/100ML IV SOLN
500.0000 mg | Freq: Two times a day (BID) | INTRAVENOUS | Status: DC
  Administered 2021-10-08: 500 mg via INTRAVENOUS
  Filled 2021-10-08: qty 100

## 2021-10-08 MED ORDER — VENLAFAXINE HCL 37.5 MG PO TABS
37.5000 mg | ORAL_TABLET | Freq: Two times a day (BID) | ORAL | Status: DC
  Filled 2021-10-08: qty 1

## 2021-10-08 MED ORDER — CALCIUM CHLORIDE 10 % IV SOLN
INTRAVENOUS | Status: AC
  Filled 2021-10-08: qty 10

## 2021-10-08 MED ORDER — METRONIDAZOLE 500 MG/100ML IV SOLN: 500.0000 mg | Freq: Two times a day (BID) | INTRAVENOUS | Status: AC

## 2021-10-08 MED ORDER — SODIUM CHLORIDE 0.9 % IV SOLN: 2.0000 g | Freq: Three times a day (TID) | INTRAVENOUS | Status: AC

## 2021-10-08 MED ORDER — CLONAZEPAM 0.5 MG PO TABS: 0.5000 mg | ORAL_TABLET | Freq: Every evening | ORAL | Status: DC

## 2021-10-08 MED ORDER — PREGABALIN 50 MG PO CAPS: 150.0000 mg | ORAL_CAPSULE | Freq: Two times a day (BID) | ORAL | Status: DC

## 2021-10-08 MED ORDER — SODIUM CHLORIDE 0.9 % IV SOLN: INTRAVENOUS | Status: DC

## 2021-10-08 MED ORDER — ALBUTEROL SULFATE (2.5 MG/3ML) 0.083% IN NEBU: 2.5000 mg | INHALATION_SOLUTION | Freq: Four times a day (QID) | RESPIRATORY_TRACT | 12 refills | Status: AC | PRN

## 2021-10-08 MED ORDER — PANTOPRAZOLE SODIUM 40 MG PO PACK: 40.0000 mg | PACK | Freq: Every day | ORAL | Status: AC

## 2021-10-08 MED ORDER — GUAIFENESIN 100 MG/5ML PO LIQD: 5.0000 mL | ORAL | 0 refills | Status: AC | PRN

## 2021-10-08 MED ORDER — ROCURONIUM BROMIDE 50 MG/5ML IV SOLN
50.0000 mg | Freq: Once | INTRAVENOUS | Status: AC
  Administered 2021-10-08: 50 mg via INTRAVENOUS
  Filled 2021-10-08: qty 5

## 2021-10-08 MED ORDER — IOHEXOL 350 MG/ML SOLN
50.0000 mL | Freq: Once | INTRAVENOUS | Status: AC | PRN
  Administered 2021-10-08: 50 mL via INTRAVENOUS

## 2021-10-08 MED ORDER — DEXMEDETOMIDINE HCL IN NACL 400 MCG/100ML IV SOLN
0.0000 ug/kg/h | INTRAVENOUS | Status: DC
  Administered 2021-10-08: 0.2 ug/kg/h via INTRAVENOUS
  Administered 2021-10-08: 0.6 ug/kg/h via INTRAVENOUS
  Filled 2021-10-08 (×2): qty 100

## 2021-10-08 MED ORDER — FENTANYL 2500MCG IN NS 250ML (10MCG/ML) PREMIX INFUSION
0.0000 ug/h | INTRAVENOUS | Status: DC
  Administered 2021-10-08: 25 ug/h via INTRAVENOUS
  Administered 2021-10-09 – 2021-10-10 (×2): 100 ug/h via INTRAVENOUS
  Administered 2021-10-11: 125 ug/h via INTRAVENOUS
  Administered 2021-10-11: 150 ug/h via INTRAVENOUS
  Administered 2021-10-12: 125 ug/h via INTRAVENOUS
  Administered 2021-10-13: 200 ug/h via INTRAVENOUS
  Administered 2021-10-13: 125 ug/h via INTRAVENOUS
  Filled 2021-10-08 (×8): qty 250

## 2021-10-08 MED ORDER — ORAL CARE MOUTH RINSE
15.0000 mL | OROMUCOSAL | Status: DC
  Administered 2021-10-08 – 2021-10-15 (×71): 15 mL via OROMUCOSAL

## 2021-10-08 MED ORDER — CHLORHEXIDINE GLUCONATE 0.12% ORAL RINSE (MEDLINE KIT)
15.0000 mL | Freq: Two times a day (BID) | OROMUCOSAL | Status: DC
  Administered 2021-10-08 – 2021-10-15 (×15): 15 mL via OROMUCOSAL

## 2021-10-08 MED ORDER — LACTATED RINGERS IV BOLUS
1000.0000 mL | Freq: Once | INTRAVENOUS | Status: AC
  Administered 2021-10-08: 1000 mL via INTRAVENOUS

## 2021-10-08 MED ORDER — VASOPRESSIN 20 UNITS/100 ML INFUSION FOR SHOCK
0.0000 [IU]/min | INTRAVENOUS | Status: DC
  Administered 2021-10-08 – 2021-10-12 (×9): 0.03 [IU]/min via INTRAVENOUS
  Filled 2021-10-08 (×9): qty 100

## 2021-10-08 MED ORDER — FREE WATER: 200.0000 mL | Freq: Four times a day (QID) | Status: AC

## 2021-10-08 MED ORDER — BUDESONIDE 0.5 MG/2ML IN SUSP: 0.5000 mg | Freq: Two times a day (BID) | RESPIRATORY_TRACT | 12 refills | Status: AC

## 2021-10-08 MED ORDER — LORATADINE 10 MG PO TABS
10.0000 mg | ORAL_TABLET | Freq: Every morning | ORAL | Status: DC
  Administered 2021-10-09: 10 mg via ORAL
  Filled 2021-10-08: qty 1

## 2021-10-08 MED ORDER — SODIUM CHLORIDE 0.9 % IV SOLN
1.2500 ng/kg/min | INTRAVENOUS | Status: AC
  Administered 2021-10-08: 5 ng/kg/min via INTRAVENOUS
  Administered 2021-10-09: 40 ng/kg/min via INTRAVENOUS
  Administered 2021-10-10: 3 ng/kg/min via INTRAVENOUS
  Filled 2021-10-08 (×2): qty 1

## 2021-10-08 MED ORDER — SODIUM BICARBONATE 8.4 % IV SOLN
INTRAVENOUS | Status: AC
Start: 2021-10-08 — End: 2021-10-08
  Filled 2021-10-08: qty 100

## 2021-10-08 MED ORDER — ENOXAPARIN SODIUM 40 MG/0.4ML IJ SOSY: 40.0000 mg | PREFILLED_SYRINGE | INTRAMUSCULAR | Status: AC

## 2021-10-08 MED ORDER — CLONAZEPAM 0.5 MG PO TABS: 0.5000 mg | ORAL_TABLET | Freq: Every day | ORAL | 0 refills | Status: AC

## 2021-10-08 MED ORDER — FENTANYL CITRATE (PF) 100 MCG/2ML IJ SOLN
25.0000 ug | INTRAMUSCULAR | Status: DC | PRN
  Administered 2021-10-08 (×2): 100 ug via INTRAVENOUS
  Administered 2021-10-08 (×2): 50 ug via INTRAVENOUS
  Administered 2021-10-08: 100 ug via INTRAVENOUS
  Administered 2021-10-09: 25 ug via INTRAVENOUS
  Administered 2021-10-09: 50 ug via INTRAVENOUS
  Filled 2021-10-08 (×4): qty 2

## 2021-10-08 MED ORDER — NOREPINEPHRINE 4 MG/250ML-% IV SOLN
INTRAVENOUS | Status: AC
  Filled 2021-10-08: qty 250

## 2021-10-08 MED ORDER — ETOMIDATE 2 MG/ML IV SOLN
20.0000 mg | Freq: Once | INTRAVENOUS | Status: AC
  Administered 2021-10-08: 20 mg via INTRAVENOUS

## 2021-10-08 MED ORDER — UMECLIDINIUM BROMIDE 62.5 MCG/ACT IN AEPB: 1.0000 | INHALATION_SPRAY | Freq: Every day | RESPIRATORY_TRACT | Status: AC

## 2021-10-08 MED ORDER — VANCOMYCIN HCL 750 MG/150ML IV SOLN
750.0000 mg | Freq: Two times a day (BID) | INTRAVENOUS | Status: DC
  Filled 2021-10-08: qty 150

## 2021-10-08 MED ORDER — NOREPINEPHRINE 4 MG/250ML-% IV SOLN: 0.0000 ug/min | INTRAVENOUS | Status: DC

## 2021-10-08 MED ORDER — DOCUSATE SODIUM 100 MG PO CAPS: 100.0000 mg | ORAL_CAPSULE | Freq: Two times a day (BID) | ORAL | Status: DC | PRN

## 2021-10-08 MED ORDER — FENTANYL CITRATE (PF) 100 MCG/2ML IJ SOLN: 25.0000 ug | INTRAMUSCULAR | Status: DC | PRN

## 2021-10-08 MED ORDER — GABAPENTIN 250 MG/5ML PO SOLN: 400.0000 mg | Freq: Three times a day (TID) | ORAL | 12 refills | Status: AC

## 2021-10-08 MED ORDER — OXYCODONE HCL 5 MG PO TABS
20.0000 mg | ORAL_TABLET | Freq: Four times a day (QID) | ORAL | Status: DC
  Filled 2021-10-08 (×3): qty 4

## 2021-10-08 MED ORDER — BUPROPION HCL ER (SR) 150 MG PO TB12: 150.0000 mg | ORAL_TABLET | Freq: Two times a day (BID) | ORAL | Status: DC

## 2021-10-08 MED ORDER — REVEFENACIN 175 MCG/3ML IN SOLN: 175.0000 ug | Freq: Every day | RESPIRATORY_TRACT | Status: DC

## 2021-10-08 MED ORDER — ARFORMOTEROL TARTRATE 15 MCG/2ML IN NEBU
15.0000 ug | INHALATION_SOLUTION | Freq: Two times a day (BID) | RESPIRATORY_TRACT | Status: DC
  Administered 2021-10-08 – 2021-10-16 (×16): 15 ug via RESPIRATORY_TRACT
  Filled 2021-10-08 (×15): qty 2

## 2021-10-08 MED ORDER — CALCIUM CHLORIDE 10 % IV SOLN
1.0000 g | Freq: Once | INTRAVENOUS | Status: AC
  Administered 2021-10-08: 1 g via INTRAVENOUS

## 2021-10-08 MED ORDER — CHLORHEXIDINE GLUCONATE CLOTH 2 % EX PADS
6.0000 | MEDICATED_PAD | Freq: Every day | CUTANEOUS | Status: DC
  Administered 2021-10-10 – 2021-10-15 (×6): 6 via TOPICAL

## 2021-10-08 MED ORDER — REVEFENACIN 175 MCG/3ML IN SOLN
175.0000 ug | Freq: Every day | RESPIRATORY_TRACT | Status: DC
  Administered 2021-10-10 – 2021-10-15 (×6): 175 ug via RESPIRATORY_TRACT
  Filled 2021-10-08 (×6): qty 3

## 2021-10-08 MED ORDER — MIDODRINE HCL 5 MG PO TABS
5.0000 mg | ORAL_TABLET | Freq: Three times a day (TID) | ORAL | Status: AC
Start: 2021-10-08 — End: ?

## 2021-10-08 MED ORDER — GABAPENTIN 250 MG/5ML PO SOLN
400.0000 mg | Freq: Three times a day (TID) | ORAL | Status: DC
Start: 2021-10-09 — End: 2021-10-08
  Filled 2021-10-08: qty 8

## 2021-10-08 MED ORDER — MIDAZOLAM HCL 2 MG/2ML IJ SOLN
INTRAMUSCULAR | Status: AC
  Filled 2021-10-08: qty 2

## 2021-10-08 MED ORDER — ETOMIDATE 2 MG/ML IV SOLN
INTRAVENOUS | Status: AC
  Filled 2021-10-08: qty 20

## 2021-10-08 MED ORDER — ARIPIPRAZOLE 2 MG PO TABS
2.0000 mg | ORAL_TABLET | Freq: Every day | ORAL | Status: AC
Start: 2021-10-09 — End: ?

## 2021-10-08 MED ORDER — POLYETHYLENE GLYCOL 3350 17 G PO PACK
17.0000 g | PACK | Freq: Every day | ORAL | Status: DC
  Administered 2021-10-09: 17 g
  Filled 2021-10-08: qty 1

## 2021-10-08 MED ORDER — METRONIDAZOLE 500 MG/100ML IV SOLN
500.0000 mg | Freq: Two times a day (BID) | INTRAVENOUS | Status: DC
  Filled 2021-10-08: qty 100

## 2021-10-08 MED ORDER — HYDROCORTISONE SOD SUC (PF) 100 MG IJ SOLR
100.0000 mg | Freq: Three times a day (TID) | INTRAMUSCULAR | Status: DC
  Administered 2021-10-08 – 2021-10-09 (×3): 100 mg via INTRAVENOUS
  Filled 2021-10-08 (×3): qty 2

## 2021-10-08 MED ORDER — SODIUM CHLORIDE 0.9 % IV SOLN
INTRAVENOUS | Status: DC | PRN
Start: 2021-10-08 — End: 2021-10-16

## 2021-10-08 MED ORDER — OXYCODONE HCL 15 MG PO TABS: 15.0000 mg | ORAL_TABLET | ORAL | 0 refills | Status: AC | PRN

## 2021-10-08 MED ORDER — SODIUM BICARBONATE 8.4 % IV SOLN
100.0000 meq | Freq: Once | INTRAVENOUS | Status: AC
  Administered 2021-10-08: 100 meq via INTRAVENOUS

## 2021-10-08 MED ORDER — FENTANYL CITRATE (PF) 100 MCG/2ML IJ SOLN
INTRAMUSCULAR | Status: AC
  Filled 2021-10-08: qty 2

## 2021-10-08 MED ORDER — BUDESONIDE 0.5 MG/2ML IN SUSP
0.5000 mg | Freq: Two times a day (BID) | RESPIRATORY_TRACT | Status: DC
Start: 2021-10-08 — End: 2021-10-16
  Administered 2021-10-08 – 2021-10-16 (×16): 0.5 mg via RESPIRATORY_TRACT
  Filled 2021-10-08 (×16): qty 2

## 2021-10-08 MED ORDER — ENOXAPARIN SODIUM 40 MG/0.4ML IJ SOSY
40.0000 mg | PREFILLED_SYRINGE | INTRAMUSCULAR | Status: DC
  Filled 2021-10-08: qty 0.4

## 2021-10-08 MED ORDER — PANTOPRAZOLE 2 MG/ML SUSPENSION
40.0000 mg | Freq: Every day | ORAL | Status: DC
  Administered 2021-10-09 – 2021-10-16 (×8): 40 mg
  Filled 2021-10-08 (×9): qty 20

## 2021-10-08 MED ORDER — ARIPIPRAZOLE 2 MG PO TABS: 2.0000 mg | ORAL_TABLET | Freq: Every morning | ORAL | Status: DC

## 2021-10-08 MED ORDER — METHOCARBAMOL 500 MG PO TABS: 500.0000 mg | ORAL_TABLET | Freq: Three times a day (TID) | ORAL | Status: AC | PRN

## 2021-10-08 MED ORDER — VANCOMYCIN HCL 750 MG/150ML IV SOLN: 750.0000 mg | Freq: Two times a day (BID) | INTRAVENOUS | Status: AC

## 2021-10-08 MED ORDER — MIDODRINE HCL 5 MG PO TABS
5.0000 mg | ORAL_TABLET | Freq: Three times a day (TID) | ORAL | Status: DC
Start: 2021-10-08 — End: 2021-10-08
  Filled 2021-10-08: qty 1

## 2021-10-08 MED ORDER — NOREPINEPHRINE 16 MG/250ML-% IV SOLN
0.0000 ug/min | INTRAVENOUS | Status: DC
  Administered 2021-10-08: 45 ug/min via INTRAVENOUS
  Administered 2021-10-08 – 2021-10-09 (×2): 60 ug/min via INTRAVENOUS
  Administered 2021-10-09: 52 ug/min via INTRAVENOUS
  Administered 2021-10-09: 55 ug/min via INTRAVENOUS
  Administered 2021-10-09: 52 ug/min via INTRAVENOUS
  Administered 2021-10-10: 56 ug/min via INTRAVENOUS
  Administered 2021-10-10: 60 ug/min via INTRAVENOUS
  Administered 2021-10-10: 54 ug/min via INTRAVENOUS
  Administered 2021-10-10 (×2): 55 ug/min via INTRAVENOUS
  Administered 2021-10-11: 39 ug/min via INTRAVENOUS
  Administered 2021-10-11: 15 ug/min via INTRAVENOUS
  Administered 2021-10-13: 10 ug/min via INTRAVENOUS
  Filled 2021-10-08 (×13): qty 250

## 2021-10-08 MED ORDER — PRAVASTATIN SODIUM 20 MG PO TABS: 20.0000 mg | ORAL_TABLET | Freq: Every day | ORAL | Status: AC

## 2021-10-08 MED ORDER — ROCURONIUM BROMIDE 10 MG/ML (PF) SYRINGE
PREFILLED_SYRINGE | INTRAVENOUS | Status: AC
  Filled 2021-10-08: qty 10

## 2021-10-08 MED ORDER — TRAZODONE HCL 150 MG PO TABS: 150.0000 mg | ORAL_TABLET | Freq: Every day | ORAL | Status: AC

## 2021-10-08 MED ORDER — AMPICILLIN-SULBACTAM SODIUM 3 (2-1) G IJ SOLR
3.0000 g | Freq: Four times a day (QID) | INTRAMUSCULAR | Status: DC
  Administered 2021-10-08 – 2021-10-10 (×7): 3 g via INTRAVENOUS
  Filled 2021-10-08 (×7): qty 8

## 2021-10-08 MED ORDER — PRAVASTATIN SODIUM 40 MG PO TABS: 20.0000 mg | ORAL_TABLET | Freq: Every day | ORAL | Status: DC
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
## 2021-10-08 NOTE — Discharge Summary (Signed)
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  Supple nontender no JVD Cardiac regular rate rhythm any extra sounds or murmur heard Abdomen.  Soft nontender positive bowel sounds Respiratory decreased breath sounds at the bases Neurologic.  Motor strength 4/5 in bilateral deltoid bicep tricep grip 3 - right and 4/5 left hip flexor.   He/She  has had improvement in activity tolerance, balance, postural control as well as ability to compensate for deficits. He/She has had improvement in functional use RUE/LUE  and RLE/LLE as well as improvement in awareness.  Therapies have been limited due to patient's ongoing medical management was discharged to acute care services        Disposition: Discharged to acute care   Diet: N.p.o.  Special Instructions: Medication changes made as per pulmonary care  Discharge Instructions     [facility] pharmacist to adjust dose for Vancomycin, Aminoglycosides and other anti-infective therapies as requested by physician.   Complete by: As directed    [facility] to provide Cath Flo 66m   Complete by: As directed    Administer for PICC line occlusion and as ordered by physician for other access device issues.   Anaphylaxis Kit: Provided to treat any anaphylactic reaction to the medication being provided to the patient if First Dose or when requested by physician   Complete by: As directed    Epinephrine 1619mml vial / amp: Administer 0.19m319m0.19ml48mubcutaneously once for moderate to severe anaphylaxis, nurse to call physician and pharmacy when reaction occurs and call 911 if needed for immediate care   Diphenhydramine 50mg319mIV vial: Administer 25-50mg 16mM PRN for first dose reaction, rash, itching, mild reaction, nurse to call physician and pharmacy when reaction occurs   Sodium Chloride 0.9% NS 500ml I419mdminister if needed for hypovolemic blood pressure drop or as ordered by physician after call to physician with anaphylactic reaction   Change dressing on IV access line weekly and PRN   Complete by: As directed    Flush IV access with Sodium Chloride 0.9% and Heparin  10 units/ml or 100 units/ml   Complete by: As directed    Home infusion instructions - [facility]   Complete by: As directed    Instructions: Flush IV access with Sodium Chloride 0.9% and Heparin 10units/ml or 100units/ml   Change dressing on IV access line: Weekly and PRN   Instructions Cath Flo 62m: Administer for PICC Line occlusion and as ordered by physician for other access device   [facility] pharmacist to adjust dose for: Vancomycin, Aminoglycosides and other anti-infective therapies as requested by  physician   Method of administration may be changed at the discretion of home infusion pharmacist based upon assessment of the patient and/or caregivers ability to self-administer the medication ordered   Complete by: As directed          Follow-up Information     SMeredith Staggers MD Follow up.   Specialty: Physical Medicine and Rehabilitation Why: Office to call for appointment Contact information: [phone]         CAshok Pall MD Follow up.   Specialty: Neurosurgery Why: Call for appointment Contact information: [address] [phone]        HShona Needles MD Follow up.   Specialty: Orthopedic Surgery Why: Call for appointment Contact information: [phone]         DSecundino Ginger PA-C Follow up.   Specialty: Cardiology Why: Call for appointment Contact information: [facility] [address] [place] [phone]         PJalene Mullet MD Follow up.   Specialty: Ophthalmology Why: Call for appointment Contact information: [id number]                Signed: DCathlyn Parsons2/21/2023, 3:01 PM

## 2021-10-08 NOTE — Progress Notes (Signed)
RT assisted with transport to CT and back to 4W 7. No complications noted.
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
## 2021-10-08 NOTE — Progress Notes (Signed)
Pts cortrak pulled due to being clogged. Pt tolerated well with no complications.

## 2021-10-08 NOTE — Progress Notes (Signed)
Pt's cortrak is clogged.  Made the charge nurse aware and she attempted to clear cortrak.  On call, Algis Liming, made aware.

## 2021-10-08 NOTE — Significant Event (Signed)
Rapid Response Event Note   Reason for Call :  O2 sat 50s on  4L [place]  Initial Focused Assessment:  Patient is lying in the bed.  He denies shortness of breath.  He states he has mild abdominal discomfort. He is pale. Mild increased work of breathing. Now on NRB and 8L [place] O2 sats 70s.  Lung sounds coarse, scattered rhonchi Heart tones regular  BP 143/111  HR 127 RR 32  O2 sat 78% O2 sats improved to 88% on NRB and 15Lnc  Per patient he vomited a little earlier today Post CT patient gaging and vomited a small about of bile.   He is feeling claustrophobic with the NRB   Interventions:  CTA done Placed on heated high flow [place] 35L, 100% RR 32  O2 sat 81-85%  He is still communicating his wishes Placed NRB in addition to Heated High Flow O2 sats 95% But he is more short of breath and uncomfortable  BP 82/36  HR 140s NS bolus started BP 95/60  HR 144  CCM at bedside: Eddie Dibbles and Dr Tacy Learn  Transfer to ICU for intubation   Wife and son at bedside during entire event. Plan of Care:     Event Summary:   MD Notified: Linna Hoff PA Call Time: [place] Time: 0947 End Time:  0962  Raliegh Ip, RN
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
## 2021-10-08 NOTE — Progress Notes (Signed)
Inpatient Rehab Admissions Coordinator:   Note pt return to acute setting.  Will follow.   Shann Medal, PT, DPT Admissions Coordinator [phone] 10/15/2021  3:42 PM

## 2021-10-08 NOTE — Care Management (Signed)
[facility] Individual Statement of Services  Patient Name:  Mark [name]  Date:  10/03/2021  Welcome to the [facility].  Our goal is to provide you with an individualized program based on your diagnosis and situation, designed to meet your specific needs.  With this comprehensive rehabilitation program, you will be expected to participate in at least 3 hours of rehabilitation therapies Monday-Friday, with modified therapy programming on the weekends.  Your rehabilitation program will include the following services:  Physical Therapy (PT), Occupational Therapy (OT), Speech Therapy (ST), 24 hour per day rehabilitation nursing, Therapeutic Recreaction (TR), Psychology, Neuropsychology, Care Coordinator, Rehabilitation Medicine, [place], and Other  Weekly team conferences will be held on Tuesdays to discuss your progress.  Your Inpatient Rehabilitation Care Coordinator will talk with you frequently to get your input and to update you on team discussions.  Team conferences with you and your family in attendance may also be held.  Expected length of stay: 5-7 days  Overall anticipated outcome: Independent with Assistive Device  Depending on your progress and recovery, your program may change. Your Inpatient Rehabilitation Care Coordinator will coordinate services and will keep you informed of any changes. Your Inpatient Rehabilitation Care Coordinator's name and contact numbers are listed  below.  The following services may also be recommended but are not provided by the [facility] will be made to provide these services after discharge if needed.  Arrangements include referral to agencies that provide these services.  Your insurance has been verified to be:  [facility]  Medicare  Your primary doctor is:  Isaias Cowman  Pertinent information will be shared with your doctor and your insurance company.  Inpatient Rehabilitation Care Coordinator:  Cathleen Corti [phone] or [phone]  Information discussed with and copy given to patient by: Rana Snare, 10/07/2021, 9:54 AM

## 2021-10-08 NOTE — Plan of Care (Signed)
Problem: Sit to Stand Goal: LTG:  Patient will perform sit to stand with assistance level (PT) Description: LTG:  Patient will perform sit to stand with assistance level (PT) Outcome: Not Met (add Reason) Note: Pt transferred to acute for further medical management.   Problem: RH Bed Mobility Goal: LTG Patient will perform bed mobility with assist (PT) Description: LTG: Patient will perform bed mobility with assistance, with/without cues (PT). Outcome: Not Met (add Reason) Note: Pt transferred to acute for further medical management.   Problem: RH Bed to Chair Transfers Goal: LTG Patient will perform bed/chair transfers w/assist (PT) Description: LTG: Patient will perform bed to chair transfers with assistance (PT). Outcome: Not Met (add Reason) Note: Pt transferred to acute for further medical management.   Problem: RH Car Transfers Goal: LTG Patient will perform car transfers with assist (PT) Description: LTG: Patient will perform car transfers with assistance (PT). Outcome: Not Met (add Reason) Note: Pt transferred to acute for further medical management.   Problem: RH Ambulation Goal: LTG Patient will ambulate in controlled environment (PT) Description: LTG: Patient will ambulate in a controlled environment, # of feet with assistance (PT). Outcome: Not Met (add Reason) Note: Pt transferred to acute for further medical management. Goal: LTG Patient will ambulate in home environment (PT) Description: LTG: Patient will ambulate in home environment, # of feet with assistance (PT). Outcome: Not Met (add Reason) Note: Pt transferred to acute for further medical management. Goal: LTG Patient will ambulate in community environment (PT) Description: LTG: Patient will ambulate in community environment, # of feet with assistance (PT). Outcome: Not Met (add Reason) Note: Pt transferred to acute for further medical management.   Problem: RH Stairs Goal: LTG Patient will ambulate up  and down stairs w/assist (PT) Description: LTG: Patient will ambulate up and down # of stairs with assistance (PT) Outcome: Not Met (add Reason) Note: Pt transferred to acute for further medical management.   Problem: RH Pre-functional/Other (Specify) Goal: RH LTG PT (Specify) 1 Description:  RH LTG PT (Specify) 1 Outcome: Not Met (add Reason) Note: Pt transferred to acute for further medical management.

## 2021-10-08 NOTE — Progress Notes (Signed)
RT called to assess pt for low sats. Upon entering the room pts sats were 53% on 3L. Pt was placed on NRB mask and 8L [place] and sats increased to 86%. ABG was obtained. MD at bedside.

## 2021-10-08 NOTE — Progress Notes (Signed)
Nurse called to room, pt was with speech, pt Sat was 52% on 3L O2 via n/c. Called respiratory, applied non-rebreather without much change. Pt appeared to be normal in color, no SOB, family reported several episodes this am of N/V. Pt did report feeling somewhat lightheaded. Called rapid to assess. Notified PA. New orders placed. Pt currently at 100% non-rebreather with 8L. Pending results of new orders. RN aware.

## 2021-10-08 NOTE — Care Plan (Signed)
GOALS OF CARE DISCUSSION   The Clinical status was relayed to patient and his wife at bedside in detail.   Updated and notified of patients medical condition.   Patient is having a weak cough and struggling to remove secretions.   Patient with increased WOB and using accessory muscles to breathe Explained to family course of therapy and the modalities   He needs to go on ventilator to improve work of breathing and acute on chronic hypoxic/hypercapnic respiratory failure, with his severe COPD there may be chance that he will not come back from ventilator   Patient and his wife understood but would like to continue full scope of care while keeping him full code. Patient stated that if with initial resuscitation, he does not get better then we can start discussion about comfort care     Family are satisfied with Plan of action and management. All questions answered    Jacky Kindle MD [facility] See Amion for pager If no response to pager, please call [phone] until 7pm After 7pm, Please call E-link [phone]

## 2021-10-08 NOTE — Progress Notes (Signed)
Inpatient Rehabilitation Discharge Medication Review by a Pharmacist  A complete drug regimen review was completed for this patient to identify any potential clinically significant medication issues.  High Risk Drug Classes Is patient taking? Indication by Medication  Antipsychotic Yes Abilify- adj. MDD therapy  Anticoagulant Yes Lovenox- VTE prophylaxis  Antibiotic Yes Cefepime, vanco, flagyl- asp pna  Opioid Yes oxyIR- pain  Antiplatelet No   Hypoglycemics/insulin No   Vasoactive Medication Yes Midodrine- hypotension  Chemotherapy No   Other No      Type of Medication Issue Identified Description of Issue Recommendation(s)  Drug Interaction(s) (clinically significant)     Duplicate Therapy     Allergy     No Medication Administration End Date     Incorrect Dose     Additional Drug Therapy Needed     Significant med changes from prior encounter (inform family/care partners about these prior to discharge).    Other       Clinically significant medication issues were identified that warrant physician communication and completion of prescribed/recommended actions by midnight of the next day:  No  Time spent performing this drug regimen review (minutes):  30   Mark [name] BS, PharmD, BCPS Clinical Pharmacist 10/13/2021 3:13 PM

## 2021-10-08 NOTE — Progress Notes (Signed)
Physical Therapy TBI Note  Patient Details  Name: Mark [name] MRN: [id number] Date of Birth: 03/10/1956  Today's Date: 09/19/2021 PT Individual Time: 1030-1110 PT Individual Time Calculation (min): 40 min   Short Term Goals: Week 1:  PT Short Term Goal 1 (Week 1): STG=LTG due to ELOS.  Skilled Therapeutic Interventions/Progress Updates:     Patient in recliner in the room on 4L/min O2 upon PT arrival. Patient alert and agreeable to PT session. Patient reported 8-9/10 R hip and low back pain during session, RN made aware. PT provided repositioning, rest breaks, and distraction as pain interventions throughout session. Patient reported increased fatigue today despite a fair nights sleep.   Discussed increased O2 support needs and nutritional intake limitation due to Cortrak being clogged. RN reports that patient required increased O2 support with OT this morning.   Vitals: BP: 75/55 mmhg HR 104-115 bpm (fluctuating, manual pulse slightly irregular, A-fib at baseline) SPO2: 88-89% on 4L/min over 2 min  Patient asymptomatic minus increased fatigue. RN made aware, provided patient with nectar thick, dairy nutritional shake.   Returned patient to bed min CGA using RW performing stand pivot transfer. Patient unable to tolerate lying flat due to back pain and reduced breath support, however, elevated B lower extremities for improved BP.   Vitals:  BP: 97/57 mmhg HR: 104 bpm and stable, manual pulse regular SPO2: 95% on 4L/min  Patient took several small sips of his nutritional shake without signs of aspiration, encouraged patient to continue nectar thick fluid intake and nutritional supplements for improved BP, patient in agreement and reports he will continue with small sips.  Switched out patient's foam cushion for Roho, due to sacral skin breakdown per chart and team conference. Educated patient on purpose and function of cushion and patient appreciative and reports sacral pain while  sitting "too long" in the recliner. Educated him on informing staff to place the cushion in the recliner when he is sitting in it.   Patient in bed with legs elevated and HOB >45 deg at end of session with breaks locked, bed alarm set, and all needs within reach.   Therapy Documentation Precautions:  Precautions Precautions: Fall, Cervical Precaution Booklet Issued: No Precaution Comments: cortrack Required Braces or Orthoses:  (no cervical brace needed per orders) Cervical Brace: For comfort Restrictions Weight Bearing Restrictions: Yes RLE Weight Bearing: Weight bearing as tolerated Agitated Behavior Scale: TBI Observation Details Observation Environment: CIR Start of observation period - Date: 10/03/2021 Start of observation period - Time: 0915 End of observation period - Date: 09/29/2021 End of observation period - Time: 0955 Agitated Behavior Scale (DO NOT LEAVE BLANKS) Short attention span, easy distractibility, inability to concentrate: Absent Impulsive, impatient, low tolerance for pain or frustration: Absent Uncooperative, resistant to care, demanding: Absent Violent and/or threatening violence toward people or property: Absent Explosive and/or unpredictable anger: Absent Rocking, rubbing, moaning, or other self-stimulating behavior: Absent Pulling at tubes, restraints, etc.: Absent Wandering from treatment areas: Absent Restlessness, pacing, excessive movement: Absent Repetitive behaviors, motor, and/or verbal: Absent Rapid, loud, or excessive talking: Absent Sudden changes of mood: Absent Easily initiated or excessive crying and/or laughter: Absent Self-abusiveness, physical and/or verbal: Absent Agitated behavior scale total score: 14    Therapy/Group: Individual Therapy  Maelani Yarbro L Caleen Taaffe PT, DPT  09/30/2021, 12:56 PM
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
## 2021-10-08 NOTE — Progress Notes (Addendum)
[facility] Progress Note Patient Name: [name] DOB: May 09, 1956 MRN: [id number]   Date of Service  10/05/2021  HPI/Events of Note  Episode of Hypoxia with sat - 79% and BP = 81/45 with MAP = 55. Patient is on Norepinephrine, Vasopressin and Giapreza IV infusion. ABG on 100%/PRVC 30/TV 620/P 5 = 7.34/52/60/ 89% sat. CXR earlier today reveals RLL/RML consolidation and hyperinflation of L lung. Last Hgb = 10.9. BP now up to 95/54 with MAP = 67 with increase in Giapreza IV infusion. Hgb = 10.9.  eICU Interventions  Plan: Increase PEEP to 8. Portable CXR STAT. Monitor CVP now and Q 4 hours. L lung dependent (his better of 2 bad lungs). Will request PCCM ground team to evaluate him at bedside to see if they have any further ideas to improve his clinical situation.      Intervention Category Major Interventions: Respiratory failure - evaluation and management;Hypotension - evaluation and management  Lysle Dingwall 09/28/2021, 11:00 PM

## 2021-10-08 NOTE — Progress Notes (Signed)
Pt placed on HHFNC 35L/100%. Sats are 90%. MD and rapid response nurse in room.

## 2021-10-08 NOTE — Progress Notes (Addendum)
PCCM INTERVAL PROGRESS NOTE  Called to bedside for hypotension. Shortly after sedation started patient became profoundly hypotensive requiring levophed initiation. Dr. Tacy Learn emergently placed CVL, art line. Now on levo, vaso. MAPs < 65. Starting giapreza. Septic shock secondary to pneumonia. DC scheduled medications that may contribute to hypotension. After discussion with wife, his SBP is in the 80s or 90s typically. Will lower MAP goal to 55.     Georgann Housekeeper, AGACNP-BC [facility]  See Amion for personal pager PCCM on call pager [phone] until 7pm. Please call Elink 7p-7a. [phone]  09/29/2021 5:44 PM

## 2021-10-08 NOTE — Progress Notes (Signed)
Pts belongs sent with wife and son. Called report to [facility] RN. Rapid RN transported pt to ICU via bed.
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
## 2021-10-08 NOTE — Procedures (Signed)
Intraosseous Needle Insertion Procedure Note    Date:09/28/2021  Time:6:39 PM   Provider Performing:Grafton Warzecha   Procedure: Insertion Intraosseous (56943)  Indication(s) Medication administration  Consent Unable to obtain consent due to emergent nature of procedure.  Anesthesia Topical only with 1% lidocaine   Timeout Verified patient identification, verified procedure, site/side was marked, verified correct patient position, special equipment/implants available, medications/allergies/relevant history reviewed, required imaging and test results available.  Procedure Description Area of needle insertion was cleaned with chlorhexidine. Intraosseous needle was placed into the left tibia. Bone marrow was aspirated and site easily flushed. The needle was secured in place and dressing applied.  Complications/Tolerance None; patient tolerated the procedure well.  EBL Minimal

## 2021-10-08 NOTE — Progress Notes (Addendum)
Speech Language Pathology TBI Daily Session Note  Patient Details  Name: Mark [name] MRN: [id number] Date of Birth: 05-11-1956  Today's Date: 10/10/2021 SLP Individual Time: 0915-0955 SLP Individual Time Calculation (min): 40 min  Short Term Goals: Week 1: SLP Short Term Goal 1 (Week 1): STG=LTG due to short ELOS  Skilled Therapeutic Interventions: Skilled treatment session focused on swallowing goals. Upon arrival, patient was awake while upright in the recliner. Of note, patient is now on 4L of supplemental oxygen to maintain O2 saturations. Patient reported performing pharyngeal strengthening exercises and thorough oral care prior to session. Patient is now on a nectar-thick diet and [place] independently utilizing small sips, effortful swallows and intermittent throat clears. Patient continues to report his swallowing function appears back to baseline, however, upon trials of pureed textures (3 bites), patient required multiple swallows and demonstrated a wet vocal quality with intermittent need to "hock" residuals orally. After trials, patient does endorse increased pharyngeal residue compared to swallowing function prior to admission. Recommend patient continue small sips of nectar-thick liquids only with ongoing education and discussions needed regarding initiation of a pureed diet with known aspiration risk. Patient left upright in recliner with alarm on and all needs within reach. Continue with current plan of care.   Of note, at ~1245 spoke to the patient, his son and his wife. SLP provided education regarding patient's current swallowing function, aspiration risk and how he is currently at a higher aspiration PNA risk due to patient's decreased functional reserve. SLP recommended patient remain on nectar-thick liquids (small sips via cup) but not initiate solids. Also discussed need for long-term alternative means of nutrition.  All in agreement. SLP noticed that the patient had an emesis bag  next to him in his bed. Patient reported he had been vomiting. Patient did not appear in any respiratory distress but SLP offered to check his O2 saturations. O2: 50, HR: 133. SLP rechecked vitals on another dynamap machine and received the same readings. SLP alerted nursing and called RT, both of which came quickly and took over care.      Pain No/Denies Pain    Agitated Behavior Scale: TBI Observation Details Observation Environment: pt room Start of observation period - Date: 10/09/21 Start of observation period - Time: 0915 End of observation period - Date: 10/09/21 End of observation period - Time: 0955 Agitated Behavior Scale (DO NOT LEAVE BLANKS) Short attention span, easy distractibility, inability to concentrate: Absent Impulsive, impatient, low tolerance for pain or frustration: Absent Uncooperative, resistant to care, demanding: Absent Violent and/or threatening violence toward people or property: Absent Explosive and/or unpredictable anger: Absent Rocking, rubbing, moaning, or other self-stimulating behavior: Absent Pulling at tubes, restraints, etc.: Absent Wandering from treatment areas: Absent Restlessness, pacing, excessive movement: Absent Repetitive behaviors, motor, and/or verbal: Absent Rapid, loud, or excessive talking: Absent Sudden changes of mood: Absent Easily initiated or excessive crying and/or laughter: Absent Self-abusiveness, physical and/or verbal: Absent Agitated behavior scale total score: 14    Therapy/Group: Individual Therapy  Mark [name] 09/19/2021, 10:38 AM

## 2021-10-08 NOTE — Procedures (Signed)
Bronchoscopy Procedure Note  Mark [name]  [id number]  June 28, 1956  Date:09/20/2021  Time:4:29 PM   Provider Performing:Shaneya Taketa   Procedure(s):  Flexible bronchoscopy with bronchial alveolar lavage (48250) and Initial Therapeutic Aspiration of Tracheobronchial Tree [phone])  Indication(s) Acute respiratory failure  Consent Unable to obtain consent due to emergent nature of procedure.  Anesthesia Etomidate and rocuronium   Time Out Verified patient identification, verified procedure, site/side was marked, verified correct patient position, special equipment/implants available, medications/allergies/relevant history reviewed, required imaging and test results available.   Sterile Technique Usual hand hygiene, masks, gowns, and gloves were used   Procedure Description Bronchoscope advanced through endotracheal tube and into airway.  Airways were examined down to subsegmental level with findings noted below.   Following diagnostic evaluation, BAL(s) performed in RLL/LLL with normal saline and return of 20cc yellowish fluid and Therapeutic aspiration performed in RLL/LLL  Findings: Yellowish secretions noted all over the respiratory tree, copious amount of secretions noted in left lower lobe and right lower lobe, therapeutic aspiration was performed and sent to the lab for culture   Complications/Tolerance None; patient tolerated the procedure well. Chest X-ray is needed post procedure.   EBL Minimal   Specimen(s) Sent to the lab

## 2021-10-08 NOTE — Progress Notes (Signed)
Physical Therapy Note  Patient Details  Name: Mark [name] MRN: [id number] Date of Birth: March 26, 1956 Today's Date: 10/11/2021    Physical Therapy Discharge Note  This patient was unable to complete the inpatient rehab program due to transferring back to acute for continue medical management; therefore did not meet their long term goals. Pt left the program at a CGA assist level for their functional mobility/ transfers. This patient is being discharged from PT services at this time.  Pt's perception of pain in the last five days was unable to answer at this time.   See CareTool for functional status details  If the patient is able to return to inpatient rehabilitation within 3 midnights, this may be considered an interrupted stay and therapy services will resume as ordered. Modification and reinstatement of their goals will be made upon completion of therapy service reevaluations.     Bishop Vanderwerf L Caelan Branden PT, DPT  10/01/2021, 8:36 PM

## 2021-10-08 NOTE — Procedures (Signed)
Arterial Catheter Insertion Procedure Note  WILLETT LEFEBER  [id number]  Jul 11, 1956  Date:10/07/2021  Time:6:43 PM    Provider Performing: Jacky Kindle    Procedure: Insertion of Arterial Line [phone]) with US guidance ([id number])   Indication(s) Blood pressure monitoring and/or need for frequent ABGs  Consent Unable to obtain consent due to emergent nature of procedure.  Anesthesia None   Time Out Verified patient identification, verified procedure, site/side was marked, verified correct patient position, special equipment/implants available, medications/allergies/relevant history reviewed, required imaging and test results available.   Sterile Technique Maximal sterile technique including full sterile barrier drape, hand hygiene, sterile gown, sterile gloves, mask, hair covering, sterile ultrasound probe cover (if used).   Procedure Description Area of catheter insertion was cleaned with chlorhexidine and draped in sterile fashion. With real-time ultrasound guidance an arterial catheter was placed into the right  Axillary  artery.  Appropriate arterial tracings confirmed on monitor.     Complications/Tolerance None; patient tolerated the procedure well.   EBL Minimal   Specimen(s) None

## 2021-10-08 NOTE — Procedures (Signed)
Intubation Procedure Note  Mark [name]  [id number]  September 09, 1955  Date:09/25/2021  Time:4:28 PM   Provider Performing:Joncarlos Atkison    Procedure: Intubation (31500)  Indication(s) Respiratory Failure  Consent Risks of the procedure as well as the alternatives and risks of each were explained to the patient and/or caregiver.  Consent for the procedure was obtained and is signed in the bedside chart   Anesthesia Etomidate and Rocuronium   Time Out Verified patient identification, verified procedure, site/side was marked, verified correct patient position, special equipment/implants available, medications/allergies/relevant history reviewed, required imaging and test results available.   Sterile Technique Usual hand hygeine, masks, and gloves were used   Procedure Description Patient positioned in bed supine.  Sedation given as noted above.  Patient was intubated with endotracheal tube using  DL .  View was Grade 2 only posterior commissure .  Number of attempts was 1.  Colorimetric CO2 detector was consistent with tracheal placement.   Complications/Tolerance None; patient tolerated the procedure well. Chest X-ray is ordered to verify placement.   EBL Minimal   Specimen(s) None

## 2021-10-08 NOTE — Procedures (Signed)
Central Venous Catheter Insertion Procedure Note  Mark [name]  [id number]  21-Mar-1956  Date:10/01/2021  Time:6:42 PM   Provider Performing:Dinorah Masullo   Procedure: Insertion of Non-tunneled Central Venous [phone]) with US guidance ([id number])   Indication(s) Medication administration  Consent Risks of the procedure as well as the alternatives and risks of each were explained to the patient and/or caregiver.  Consent for the procedure was obtained and is signed in the bedside chart  Anesthesia Topical only with 1% lidocaine   Timeout Verified patient identification, verified procedure, site/side was marked, verified correct patient position, special equipment/implants available, medications/allergies/relevant history reviewed, required imaging and test results available.  Sterile Technique Maximal sterile technique including full sterile barrier drape, hand hygiene, sterile gown, sterile gloves, mask, hair covering, sterile ultrasound probe cover (if used).  Procedure Description Area of catheter insertion was cleaned with chlorhexidine and draped in sterile fashion.  With real-time ultrasound guidance a central venous catheter was placed into the right subclavian vein. Nonpulsatile blood flow and easy flushing noted in all ports.  The catheter was sutured in place and sterile dressing applied.  Complications/Tolerance None; patient tolerated the procedure well. Chest X-ray is ordered to verify placement for internal jugular or subclavian cannulation.   Chest x-ray is not ordered for femoral cannulation.  EBL Minimal  Specimen(s) None

## 2021-10-08 NOTE — Progress Notes (Signed)
[facility] Progress Note Patient Name: Mark [name] DOB: 07/12/1956 MRN: [id number]   Date of Service  10/03/2021  HPI/Events of Note  Review of CXR reveals: 1. Extensive right lower lobe consolidation with progression from earlier today. Possible developing right pleural effusion. 2. Developing septal thickening at the left lung base that may represent pulmonary edema. 3. Support apparatus unchanged. 4. Emphysema.  eICU Interventions  Plan: Continue present management.      Intervention Category Major Interventions: Hypoxemia - evaluation and management  Lysle Dingwall 09/23/2021, 11:34 PM

## 2021-10-08 NOTE — Progress Notes (Signed)
Attempted to clear cortrak with no success. PA and MD made aware. Cortrak team not available today.

## 2021-10-08 NOTE — Progress Notes (Signed)
Patient ID: Mark [name], male   DOB: 24-Mar-1956, 66 y.o.   MRN: [id number]  SW met with pt, pt wife, and pt son in room to provide udpates from team conference, and discuss d/c date 2/28. During assessment, pt shared medical concerns reported such as emesis 3xs and instant onset of nausea. While in room concerns related to pt oxygen sats were observed by SLP. SW to follow-up later with pt and family due to current medical concerns.    *SW later informed pt will transfer to acute due to medical concerns.   Loralee Pacas, MSW, [place] Office: [phone] Cell: [phone] Fax: [phone]

## 2021-10-08 NOTE — Progress Notes (Signed)
Occupational Therapy TBI Note  Patient Details  Name: Mark [name] MRN: [id number] Date of Birth: July 20, 1956  Today's Date: 09/26/2021 OT Individual Time: 3428-7681 OT Individual Time Calculation (min): 70 min    Short Term Goals: Week 1:  OT Short Term Goal 1 (Week 1): STG= LTG d/t ELOS  Skilled Therapeutic Interventions/Progress Updates:    Pt received supine with 10/10 pain in his feet, legs, neck, and back- pt due for pain medication and LPN Lovena Le present to administer. Cortrak still clogged. Bed mobility with (S). Pt sat EOB to drink nectar thick fluid with medication. Functional mobility to the bathroom for shower transfer- pt more sore than yesterday and requiring more assist- min-mod A. Pt more fatigued overall today- drop in SpO2 to 80% on 2L O2 after ambulation, requiring several minutes rest and cueing for breathing technique, as well as titration to 3L O2 to rise to 87%. Pt also tachycardic following- at 120 bpm. It reduced to 110-112 bpm following rest. Utilized cueing for energy conservation strategies throughout and provided edu. Pt reporting he is asymptomatic. Pt able to complete shower at CGA level overall, using TTB for majority. He transferred back to w/c- deferred mobility d/t lower SpO2. Following shower SpO2 actually was more stable at 90%. Respiratory therapists visited room at beginning of session but reported they would come back after session so pt is also likely due for a breathing treatment. Respiratory therapy entered and administered breathing treatment. O2 titrated to 4L O2. Pt stable on 90%. Pt transferred to the recliner and was left sitting up with all needs met, chair alarm set.   Therapy Documentation Precautions:  Precautions Precautions: Fall, Cervical Precaution Booklet Issued: No Precaution Comments: cortrack Required Braces or Orthoses:  (no cervical brace needed per orders) Cervical Brace: For comfort Restrictions Weight Bearing Restrictions:  Yes RLE Weight Bearing: Weight bearing as tolerated  Agitated Behavior Scale: TBI Observation Details Observation Environment: pt room Start of observation period - Date: 09/29/2021 Start of observation period - Time: 0730 End of observation period - Date: 09/30/2021 End of observation period - Time: 0845 Agitated Behavior Scale (DO NOT LEAVE BLANKS) Short attention span, easy distractibility, inability to concentrate: Absent Impulsive, impatient, low tolerance for pain or frustration: Absent Uncooperative, resistant to care, demanding: Absent Violent and/or threatening violence toward people or property: Absent Explosive and/or unpredictable anger: Absent Rocking, rubbing, moaning, or other self-stimulating behavior: Absent Pulling at tubes, restraints, etc.: Absent Wandering from treatment areas: Absent Restlessness, pacing, excessive movement: Absent Repetitive behaviors, motor, and/or verbal: Absent Rapid, loud, or excessive talking: Absent Sudden changes of mood: Absent Easily initiated or excessive crying and/or laughter: Absent Self-abusiveness, physical and/or verbal: Absent Agitated behavior scale total score: 14    Therapy/Group: Individual Therapy  Curtis Sites 09/22/2021, 8:54 AM

## 2021-10-08 NOTE — Progress Notes (Signed)
Occupational Therapy Note  Patient Details  Name: Mark [name] MRN: [id number] Date of Birth: [birth year]  Occupational Therapy Discharge Note  This patient was unable to complete the inpatient rehab program due to acute respiratory failure; therefore did not meet their long term goals. Pt left the program at a min assist level for their functional ADLs. This patient is being discharged from OT services at this time.  BIMS at time of d/c-Pt unable to complete due to medical status  See CareTool for functional status details.  If the patient is able to return to inpatient rehabilitation within 3 midnights, this may be considered an interrupted stay and therapy services will resume as ordered. Modification and reinstatement of their goals will be made upon completion of therapy service reevaluations.    Curtis Sites 10/12/2021, 8:44 PM

## 2021-10-08 NOTE — Progress Notes (Signed)
[facility] Progress Note Patient Name: Mark [name] DOB: 11/17/1955 MRN: [id number]   Date of Service  10/03/2021  HPI/Events of Note  Nursing reports that the patient is very awake on ventilator. Fentanyl IV PRN not effective. Nurse restarting Precedex IV infusion at low dose. If Precedex not tolerated, will switch to low dose Fentanyl IV infusion.   eICU Interventions  Plan: Low dose Fentanyl IV infusion (0-200 mcg/hour). Titrate to RASS = 0 to  -1.     Intervention Category Major Interventions: Other:  Lysle Dingwall 09/26/2021, 9:30 PM

## 2021-10-08 NOTE — Plan of Care (Signed)
Goals not met because pt transferred to acute for further medical management. Problem: RH Balance Goal: LTG Patient will maintain dynamic standing with ADLs (OT) Description: LTG:  Patient will maintain dynamic standing balance with assist during activities of daily living (OT)  Outcome: Not Met (add Reason)   Problem: RH Bathing Goal: LTG Patient will bathe all body parts with assist levels (OT) Description: LTG: Patient will bathe all body parts with assist levels (OT) Outcome: Not Met (add Reason)   Problem: RH Dressing Goal: LTG Patient will perform lower body dressing w/assist (OT) Description: LTG: Patient will perform lower body dressing with assist, with/without cues in positioning using equipment (OT) Outcome: Not Met (add Reason)   Problem: RH Toileting Goal: LTG Patient will perform toileting task (3/3 steps) with assistance level (OT) Description: LTG: Patient will perform toileting task (3/3 steps) with assistance level (OT)  Outcome: Not Met (add Reason)   Problem: RH Toilet Transfers Goal: LTG Patient will perform toilet transfers w/assist (OT) Description: LTG: Patient will perform toilet transfers with assist, with/without cues using equipment (OT) Outcome: Not Met (add Reason)   Problem: RH Tub/Shower Transfers Goal: LTG Patient will perform tub/shower transfers w/assist (OT) Description: LTG: Patient will perform tub/shower transfers with assist, with/without cues using equipment (OT) Outcome: Not Met (add Reason)   Problem: RH Memory Goal: LTG Patient will demonstrate ability for day to day recall/carry over during activities of daily living with assistance level (OT) Description: LTG:  Patient will demonstrate ability for day to day recall/carry over during activities of daily living with assistance level (OT). Outcome: Not Met (add Reason)

## 2021-10-09 ENCOUNTER — Other Ambulatory Visit: Payer: Self-pay

## 2021-10-09 ENCOUNTER — Inpatient Hospital Stay (HOSPITAL_COMMUNITY): Payer: Medicare Other

## 2021-10-09 LAB — POCT I-STAT 7, (LYTES, BLD GAS, ICA,H+H)
Acid-base deficit: 1 mmol/L (ref 0.0–2.0)
Acid-base deficit: 3 mmol/L — ABNORMAL HIGH (ref 0.0–2.0)
Acid-base deficit: 6 mmol/L — ABNORMAL HIGH (ref 0.0–2.0)
Acid-base deficit: 7 mmol/L — ABNORMAL HIGH (ref 0.0–2.0)
Bicarbonate: 21.1 mmol/L (ref 20.0–28.0)
Bicarbonate: 21.9 mmol/L (ref 20.0–28.0)
Bicarbonate: 25 mmol/L (ref 20.0–28.0)
Bicarbonate: 25.2 mmol/L (ref 20.0–28.0)
Calcium, Ion: 0.94 mmol/L — ABNORMAL LOW (ref 1.15–1.40)
Calcium, Ion: 1.03 mmol/L — ABNORMAL LOW (ref 1.15–1.40)
Calcium, Ion: 1.12 mmol/L — ABNORMAL LOW (ref 1.15–1.40)
Calcium, Ion: 1.2 mmol/L (ref 1.15–1.40)
HCT: 25 % — ABNORMAL LOW (ref 39.0–52.0)
HCT: 27 % — ABNORMAL LOW (ref 39.0–52.0)
HCT: 27 % — ABNORMAL LOW (ref 39.0–52.0)
HCT: 37 % — ABNORMAL LOW (ref 39.0–52.0)
Hemoglobin: 12.6 g/dL — ABNORMAL LOW (ref 13.0–17.0)
Hemoglobin: 8.5 g/dL — ABNORMAL LOW (ref 13.0–17.0)
Hemoglobin: 9.2 g/dL — ABNORMAL LOW (ref 13.0–17.0)
Hemoglobin: 9.2 g/dL — ABNORMAL LOW (ref 13.0–17.0)
O2 Saturation: 80 %
O2 Saturation: 86 %
O2 Saturation: 91 %
O2 Saturation: 94 %
Patient temperature: 36.7
Patient temperature: 37.3
Patient temperature: 98.6
Patient temperature: 98.6
Potassium: 3.9 mmol/L (ref 3.5–5.1)
Potassium: 4.1 mmol/L (ref 3.5–5.1)
Potassium: 4.2 mmol/L (ref 3.5–5.1)
Potassium: 4.5 mmol/L (ref 3.5–5.1)
Sodium: 141 mmol/L (ref 135–145)
Sodium: 141 mmol/L (ref 135–145)
Sodium: 142 mmol/L (ref 135–145)
Sodium: 143 mmol/L (ref 135–145)
TCO2: 23 mmol/L (ref 22–32)
TCO2: 24 mmol/L (ref 22–32)
TCO2: 27 mmol/L (ref 22–32)
TCO2: 27 mmol/L (ref 22–32)
pCO2 arterial: 51.2 mmHg — ABNORMAL HIGH (ref 32–48)
pCO2 arterial: 56.1 mmHg — ABNORMAL HIGH (ref 32–48)
pCO2 arterial: 58 mmHg — ABNORMAL HIGH (ref 32–48)
pCO2 arterial: 59.2 mmHg — ABNORMAL HIGH (ref 32–48)
pH, Arterial: 7.168 — CL (ref 7.35–7.45)
pH, Arterial: 7.199 — CL (ref 7.35–7.45)
pH, Arterial: 7.234 — ABNORMAL LOW (ref 7.35–7.45)
pH, Arterial: 7.302 — ABNORMAL LOW (ref 7.35–7.45)
pO2, Arterial: 54 mmHg — ABNORMAL LOW (ref 83–108)
pO2, Arterial: 65 mmHg — ABNORMAL LOW (ref 83–108)
pO2, Arterial: 75 mmHg — ABNORMAL LOW (ref 83–108)
pO2, Arterial: 82 mmHg — ABNORMAL LOW (ref 83–108)

## 2021-10-09 LAB — GLUCOSE, CAPILLARY
Glucose-Capillary: 106 mg/dL — ABNORMAL HIGH (ref 70–99)
Glucose-Capillary: 110 mg/dL — ABNORMAL HIGH (ref 70–99)
Glucose-Capillary: 120 mg/dL — ABNORMAL HIGH (ref 70–99)
Glucose-Capillary: 126 mg/dL — ABNORMAL HIGH (ref 70–99)
Glucose-Capillary: 167 mg/dL — ABNORMAL HIGH (ref 70–99)
Glucose-Capillary: 97 mg/dL (ref 70–99)

## 2021-10-09 LAB — CBC
HCT: 30.7 % — ABNORMAL LOW (ref 39.0–52.0)
Hemoglobin: 9.6 g/dL — ABNORMAL LOW (ref 13.0–17.0)
MCH: 29.4 pg (ref 26.0–34.0)
MCHC: 31.3 g/dL (ref 30.0–36.0)
MCV: 94.2 fL (ref 80.0–100.0)
Platelets: 609 10*3/uL — ABNORMAL HIGH (ref 150–400)
RBC: 3.26 MIL/uL — ABNORMAL LOW (ref 4.22–5.81)
RDW: 17.1 % — ABNORMAL HIGH (ref 11.5–15.5)
WBC: 9.6 10*3/uL (ref 4.0–10.5)
nRBC: 0 % (ref 0.0–0.2)

## 2021-10-09 LAB — HEPATIC FUNCTION PANEL
ALT: 27 U/L (ref 0–44)
AST: 40 U/L (ref 15–41)
Albumin: 2.4 g/dL — ABNORMAL LOW (ref 3.5–5.0)
Alkaline Phosphatase: 138 U/L — ABNORMAL HIGH (ref 38–126)
Bilirubin, Direct: 0.3 mg/dL — ABNORMAL HIGH (ref 0.0–0.2)
Indirect Bilirubin: 0.3 mg/dL (ref 0.3–0.9)
Total Bilirubin: 0.6 mg/dL (ref 0.3–1.2)
Total Protein: 5.6 g/dL — ABNORMAL LOW (ref 6.5–8.1)

## 2021-10-09 LAB — BASIC METABOLIC PANEL
Anion gap: 12 (ref 5–15)
BUN: 48 mg/dL — ABNORMAL HIGH (ref 8–23)
CO2: 25 mmol/L (ref 22–32)
Calcium: 8.1 mg/dL — ABNORMAL LOW (ref 8.9–10.3)
Chloride: 102 mmol/L (ref 98–111)
Creatinine, Ser: 1.67 mg/dL — ABNORMAL HIGH (ref 0.61–1.24)
GFR, Estimated: 45 mL/min — ABNORMAL LOW (ref >60)
Glucose, Bld: 178 mg/dL — ABNORMAL HIGH (ref 70–99)
Potassium: 4 mmol/L (ref 3.5–5.1)
Sodium: 139 mmol/L (ref 135–145)

## 2021-10-09 LAB — TROPONIN I (HIGH SENSITIVITY): Troponin I (High Sensitivity): 78 ng/L — ABNORMAL HIGH (ref <18)

## 2021-10-09 LAB — MAGNESIUM: Magnesium: 1.7 mg/dL (ref 1.7–2.4)

## 2021-10-09 LAB — PHOSPHORUS: Phosphorus: 5.1 mg/dL — ABNORMAL HIGH (ref 2.5–4.6)

## 2021-10-09 LAB — LACTIC ACID, PLASMA: Lactic Acid, Venous: 6.1 mmol/L (ref 0.5–1.9)

## 2021-10-09 MED ORDER — HYDROCORTISONE SOD SUC (PF) 100 MG IJ SOLR
100.0000 mg | Freq: Two times a day (BID) | INTRAMUSCULAR | Status: DC
  Administered 2021-10-09 – 2021-10-13 (×8): 100 mg via INTRAVENOUS
  Filled 2021-10-09 (×8): qty 2

## 2021-10-09 MED ORDER — SODIUM BICARBONATE 8.4 % IV SOLN
100.0000 meq | Freq: Once | INTRAVENOUS | Status: AC
  Administered 2021-10-09: 100 meq via INTRAVENOUS
  Filled 2021-10-09: qty 100

## 2021-10-09 MED ORDER — INSULIN ASPART 100 UNIT/ML IJ SOLN
0.0000 [IU] | INTRAMUSCULAR | Status: DC
  Administered 2021-10-10 (×2): 1 [IU] via SUBCUTANEOUS
  Administered 2021-10-10: 2 [IU] via SUBCUTANEOUS
  Administered 2021-10-10 (×3): 1 [IU] via SUBCUTANEOUS
  Administered 2021-10-11 (×2): 3 [IU] via SUBCUTANEOUS

## 2021-10-09 MED ORDER — ENOXAPARIN SODIUM 30 MG/0.3ML IJ SOSY
30.0000 mg | PREFILLED_SYRINGE | INTRAMUSCULAR | Status: DC
  Administered 2021-10-09 – 2021-10-11 (×3): 30 mg via SUBCUTANEOUS
  Filled 2021-10-09 (×3): qty 0.3

## 2021-10-09 MED ORDER — FENTANYL BOLUS VIA INFUSION
25.0000 ug | INTRAVENOUS | Status: DC | PRN
  Administered 2021-10-09 – 2021-10-11 (×7): 100 ug via INTRAVENOUS
  Administered 2021-10-12 – 2021-10-13 (×7): 50 ug via INTRAVENOUS
  Filled 2021-10-09: qty 100

## 2021-10-09 MED ORDER — SODIUM CHLORIDE 0.9 % IV BOLUS
1000.0000 mL | Freq: Once | INTRAVENOUS | Status: AC
Start: 2021-10-09 — End: 2021-10-09
  Administered 2021-10-09: 1000 mL via INTRAVENOUS

## 2021-10-09 MED ORDER — FUROSEMIDE 10 MG/ML IJ SOLN
40.0000 mg | Freq: Once | INTRAMUSCULAR | Status: AC
  Administered 2021-10-09: 40 mg via INTRAVENOUS
  Filled 2021-10-09: qty 4

## 2021-10-09 MED ORDER — LORATADINE 10 MG PO TABS: 10.0000 mg | ORAL_TABLET | Freq: Every morning | ORAL | Status: DC

## 2021-10-09 MED ORDER — ASPIRIN 81 MG PO CHEW
81.0000 mg | CHEWABLE_TABLET | Freq: Every day | ORAL | Status: DC
  Administered 2021-10-09 – 2021-10-16 (×8): 81 mg
  Filled 2021-10-09 (×8): qty 1

## 2021-10-09 MED ORDER — DOCUSATE SODIUM 50 MG/5ML PO LIQD
100.0000 mg | Freq: Two times a day (BID) | ORAL | Status: DC | PRN
  Administered 2021-10-10: 100 mg
  Filled 2021-10-09: qty 10

## 2021-10-09 MED ORDER — MIDAZOLAM HCL 2 MG/2ML IJ SOLN
1.0000 mg | INTRAMUSCULAR | Status: DC | PRN
  Administered 2021-10-12 – 2021-10-13 (×7): 1 mg via INTRAVENOUS
  Filled 2021-10-09 (×7): qty 2

## 2021-10-09 MED ORDER — SODIUM BICARBONATE 8.4 % IV SOLN
INTRAVENOUS | Status: DC
  Filled 2021-10-09 (×3): qty 1000

## 2021-10-09 MED ORDER — CALCIUM CHLORIDE 10 % IV SOLN
1.0000 g | Freq: Once | INTRAVENOUS | Status: AC
  Administered 2021-10-09: 1 g via INTRAVENOUS
  Filled 2021-10-09: qty 10

## 2021-10-09 MED ORDER — CLONAZEPAM 1 MG PO TABS
1.0000 mg | ORAL_TABLET | Freq: Two times a day (BID) | ORAL | Status: DC
  Administered 2021-10-09 – 2021-10-15 (×13): 1 mg
  Filled 2021-10-09 (×13): qty 1

## 2021-10-09 MED ORDER — MIDAZOLAM HCL 2 MG/2ML IJ SOLN: 1.0000 mg | INTRAMUSCULAR | Status: DC | PRN

## 2021-10-09 MED ORDER — VANCOMYCIN HCL 1250 MG/250ML IV SOLN
1250.0000 mg | Freq: Once | INTRAVENOUS | Status: AC
  Administered 2021-10-09: 1250 mg via INTRAVENOUS
  Filled 2021-10-09: qty 250

## 2021-10-09 MED ORDER — LACTATED RINGERS IV SOLN
INTRAVENOUS | Status: DC
Start: 2021-10-09 — End: 2021-10-09

## 2021-10-09 NOTE — Procedures (Signed)
Bedside chest ultrasound:   Right-sided consolidation with minimal pleural effusion.

## 2021-10-09 NOTE — Progress Notes (Signed)
Speech Language Pathology Note   Patient Details  Name: Mark [name] MRN: [id number] Date of Birth: 08-Feb-1956 Today's Date: 10/09/2021   Speech Therapy Discharge Note  This patient was unable to complete the inpatient rehab program due to acute respiratory distress requiring transfer to acute care; therefore did not meet their long term goals. Pt left the program at a Mod I assist level for their cognition. They are currently on a NPO.  This patient is being discharged from SLP services at this time.   See CareTool for functional status details.  If the patient is able to return to inpatient rehabilitation within 3 midnights, this may be considered an interrupted stay and therapy services will resume as ordered. Modification and reinstatement of their goals will be made upon completion of therapy service reevaluations.    Edin Skarda 10/09/2021, 6:17 AM   Weston Anna, MA, CCC-SLP
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
## 2021-10-09 NOTE — Progress Notes (Signed)
[facility] Progress Note Patient Name: Mark [name] DOB: 25-Apr-1956 MRN: [id number]   Date of Service  10/09/2021  HPI/Events of Note  Nursing request for ABG on NaHCO3 IV infusion and AM labs.   eICU Interventions  Plan: ABG at 9:30 PM. CBC with platelets, CMP and Mg++ level in AM.      Intervention Category Major Interventions: Other:  Lysle Dingwall 10/09/2021, 8:13 PM

## 2021-10-09 NOTE — Progress Notes (Incomplete)
Pharmacy Phenobarbital Consult Note   Pharmacy Consult for {Route of administration:19197::"PO","Per tube","IV"} Phenobarbital   Indication: Alcohol Withdrawal    Labs:  Lab Results  Component Value Date   CREATININE 1.67 (H) 10/09/2021   AST 29 09/30/2021   ALT 24 09/21/2021   ALKPHOS 254 (H) 10/10/2021    Nursing Bedside Screening:   AUDIT-C:*** PAWSS: *** Last known drink: *** Bedside assessment completed by *** (nurse name) on *** (date)   Assessment: Mark [name] is a 66 y.o. year old male admitted on 10/07/2021. Patients meets criteria for {risk criteria:19197::"low risk","high risk","active delirium tremens"} dosing of phenobarbital. Pharmacy consulted to dose phenobarbital.    Plan: Start {Phenobarbital dosing:26978} Start Lorazepam 1mg  IV q4h prn agitation     Thank you for allowing pharmacy to participate in this patient's care.  Bess Harvest Karyme Mcconathy 10/09/2021,7:39 AM

## 2021-10-09 NOTE — Progress Notes (Signed)
Patient's O2 sat dropped to 78%, unable to suction out any secretions. RT called to bedside. RT lavaged patient, still unable to suction any secretions from ETT tube. RT bagged patient without difficulty. O2 sats remain at 88%. [place] notified.

## 2021-10-09 NOTE — Progress Notes (Signed)
Placed patient on back per MD verbal order

## 2021-10-09 NOTE — Progress Notes (Signed)
Patient met criteria for severe ARDS, he was proned 2 hours after proning, ABG was repeated which showed drop in P/F ratio from 75-54, his pH went down to 7.19 As proning did not improve his ventilation, decision was to supine the patient. Repeat lactate came back 6.1, patient was given 2 A of bicarbonate and was started on bicarbonate infusion His urine output is low, likely due to ischemic ATN from shock.  Patient's family was updated   Additional critical care time: 20 minutes  Performed by: Jacky Kindle   Critical care time was exclusive of separately billable procedures and treating other patients.   Critical care was necessary to treat or prevent imminent or life-threatening deterioration.   Critical care was time spent personally by me on the following activities: development of treatment plan with patient and/or surrogate as well as nursing, discussions with consultants, evaluation of patient's response to treatment, examination of patient, obtaining history from patient or surrogate, ordering and performing treatments and interventions, ordering and review of laboratory studies, ordering and review of radiographic studies, pulse oximetry and re-evaluation of patient's condition.   Jacky Kindle MD [facility] See Amion for pager If no response to pager, please call [phone] until 7pm After 7pm, Please call E-link [phone]

## 2021-10-09 NOTE — Plan of Care (Signed)
Transferred to acute for decline in condition.
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
## 2021-10-09 NOTE — Progress Notes (Signed)
Replaced ETT holder with tape then patient proned MD and RNs at bedside

## 2021-10-09 NOTE — Progress Notes (Signed)
[place] Progress Note Patient Name: HAVEN FOSS DOB: [birth year] MRN: [id number]   Date of Service  10/09/2021  HPI/Events of Note  Hypotension - CVP reported to be = 0.   eICU Interventions  Plan: Bolus with 0.9 NaCl 1 liter IV over 1 hour now.      Intervention Category Major Interventions: Hypotension - evaluation and management  Illona Bulman [place] 10/09/2021, 12:02 AM

## 2021-10-09 NOTE — Progress Notes (Addendum)
[place] Progress Note Patient Name: [name] DOB: [birth year] MRN: [id number]   Date of Service  10/09/2021  HPI/Events of Note  Nursing reports new rhythm change and monitor alarming for ST elevation in II and aVF. EKG reveals: Long QTc. QTc = 0.628 seconds. Accelerated Junctional rhythm. Low voltage QRS. Nonspecific T wave abnormality. BP = 106/57 with MAP = 75. Last K+ from ABG at 9:10 PM = 4.5. ABG on 100%/PRVC 34/TV 620/P 8 = 7.168/58.0/65/21.1. No room to increase PRVC rate or TV.  eICU Interventions  Plan: ASA 81 mg per tube now and Q day.  Cycle Troponin. NaHCO3 100 meq IV now. Increase D5 NaHCO3 IV infusion to 100 mL/hour. Repeat ABG at 12 midnight.     Intervention Category Major Interventions: Arrhythmia - evaluation and management  Leata Dominy Cornelia Copa 10/09/2021, 9:11 PM

## 2021-10-10 LAB — GLUCOSE, CAPILLARY
Glucose-Capillary: 121 mg/dL — ABNORMAL HIGH (ref 70–99)
Glucose-Capillary: 124 mg/dL — ABNORMAL HIGH (ref 70–99)
Glucose-Capillary: 130 mg/dL — ABNORMAL HIGH (ref 70–99)
Glucose-Capillary: 145 mg/dL — ABNORMAL HIGH (ref 70–99)
Glucose-Capillary: 139 mg/dL — ABNORMAL HIGH (ref 70–99)
Glucose-Capillary: 187 mg/dL — ABNORMAL HIGH (ref 70–99)

## 2021-10-10 LAB — CULTURE, BAL-QUANTITATIVE W GRAM STAIN
Culture: >=100000 — AB
Culture: >=100000 — AB

## 2021-10-10 LAB — CBC WITH DIFFERENTIAL/PLATELET
Abs Immature Granulocytes: 1.3 10*3/uL — ABNORMAL HIGH (ref 0.00–0.07)
Basophils Absolute: 0 10*3/uL (ref 0.0–0.1)
Basophils Relative: 0 %
Eosinophils Absolute: 0 10*3/uL (ref 0.0–0.5)
Eosinophils Relative: 0 %
HCT: 25.4 % — ABNORMAL LOW (ref 39.0–52.0)
Hemoglobin: 8 g/dL — ABNORMAL LOW (ref 13.0–17.0)
Immature Granulocytes: 7 %
Lymphocytes Relative: 2 %
Lymphs Abs: 0.3 10*3/uL — ABNORMAL LOW (ref 0.7–4.0)
MCH: 29.7 pg (ref 26.0–34.0)
MCHC: 31.5 g/dL (ref 30.0–36.0)
MCV: 94.4 fL (ref 80.0–100.0)
Monocytes Absolute: 0.7 10*3/uL (ref 0.1–1.0)
Monocytes Relative: 4 %
Neutro Abs: 16.6 10*3/uL — ABNORMAL HIGH (ref 1.7–7.7)
Neutrophils Relative %: 87 %
Platelets: 358 10*3/uL (ref 150–400)
RBC: 2.69 MIL/uL — ABNORMAL LOW (ref 4.22–5.81)
RDW: 16.8 % — ABNORMAL HIGH (ref 11.5–15.5)
Smear Review: ADEQUATE
WBC: 19 10*3/uL — ABNORMAL HIGH (ref 4.0–10.5)
nRBC: 0 % (ref 0.0–0.2)

## 2021-10-10 LAB — POCT I-STAT 7, (LYTES, BLD GAS, ICA,H+H)
Acid-Base Excess: 0 mmol/L (ref 0.0–2.0)
Acid-base deficit: 1 mmol/L (ref 0.0–2.0)
Bicarbonate: 24.7 mmol/L (ref 20.0–28.0)
Bicarbonate: 24.6 mmol/L (ref 20.0–28.0)
Calcium, Ion: 0.82 mmol/L — CL (ref 1.15–1.40)
Calcium, Ion: 0.9 mmol/L — ABNORMAL LOW (ref 1.15–1.40)
HCT: 23 % — ABNORMAL LOW (ref 39.0–52.0)
HCT: 23 % — ABNORMAL LOW (ref 39.0–52.0)
Hemoglobin: 7.8 g/dL — ABNORMAL LOW (ref 13.0–17.0)
Hemoglobin: 7.8 g/dL — ABNORMAL LOW (ref 13.0–17.0)
O2 Saturation: 100 %
O2 Saturation: 96 %
Potassium: 4.4 mmol/L (ref 3.5–5.1)
Potassium: 4.2 mmol/L (ref 3.5–5.1)
Sodium: 143 mmol/L (ref 135–145)
Sodium: 141 mmol/L (ref 135–145)
TCO2: 26 mmol/L (ref 22–32)
TCO2: 26 mmol/L (ref 22–32)
pCO2 arterial: 43.6 mmHg (ref 32–48)
pCO2 arterial: 39.5 mmHg (ref 32–48)
pH, Arterial: 7.362 (ref 7.35–7.45)
pH, Arterial: 7.403 (ref 7.35–7.45)
pO2, Arterial: 187 mmHg — ABNORMAL HIGH (ref 83–108)
pO2, Arterial: 81 mmHg — ABNORMAL LOW (ref 83–108)

## 2021-10-10 LAB — COMPREHENSIVE METABOLIC PANEL
ALT: 653 U/L — ABNORMAL HIGH (ref 0–44)
AST: 1153 U/L — ABNORMAL HIGH (ref 15–41)
Albumin: 2.4 g/dL — ABNORMAL LOW (ref 3.5–5.0)
Alkaline Phosphatase: 128 U/L — ABNORMAL HIGH (ref 38–126)
Anion gap: 20 — ABNORMAL HIGH (ref 5–15)
BUN: 60 mg/dL — ABNORMAL HIGH (ref 8–23)
CO2: 23 mmol/L (ref 22–32)
Calcium: 6.6 mg/dL — ABNORMAL LOW (ref 8.9–10.3)
Chloride: 101 mmol/L (ref 98–111)
Creatinine, Ser: 2.2 mg/dL — ABNORMAL HIGH (ref 0.61–1.24)
GFR, Estimated: 32 mL/min — ABNORMAL LOW (ref >60)
Glucose, Bld: 131 mg/dL — ABNORMAL HIGH (ref 70–99)
Potassium: 4.3 mmol/L (ref 3.5–5.1)
Sodium: 144 mmol/L (ref 135–145)
Total Bilirubin: 0.9 mg/dL (ref 0.3–1.2)
Total Protein: 5.3 g/dL — ABNORMAL LOW (ref 6.5–8.1)

## 2021-10-10 LAB — MAGNESIUM
Magnesium: 1.8 mg/dL (ref 1.7–2.4)
Magnesium: 2.3 mg/dL (ref 1.7–2.4)

## 2021-10-10 LAB — LACTIC ACID, PLASMA
Lactic Acid, Venous: 8.2 mmol/L (ref 0.5–1.9)
Lactic Acid, Venous: >9 mmol/L (ref 0.5–1.9)

## 2021-10-10 LAB — PHOSPHORUS: Phosphorus: 5.9 mg/dL — ABNORMAL HIGH (ref 2.5–4.6)

## 2021-10-10 LAB — TROPONIN I (HIGH SENSITIVITY): Troponin I (High Sensitivity): 58 ng/L — ABNORMAL HIGH (ref <18)

## 2021-10-10 MED ORDER — ACETAMINOPHEN 325 MG PO TABS: 650.0000 mg | ORAL_TABLET | Freq: Four times a day (QID) | ORAL | Status: DC | PRN

## 2021-10-10 MED ORDER — SODIUM CHLORIDE 0.9 % IV SOLN
4.0000 g | Freq: Once | INTRAVENOUS | Status: AC
  Administered 2021-10-10: 4 g via INTRAVENOUS
  Filled 2021-10-10: qty 40

## 2021-10-10 MED ORDER — SODIUM CHLORIDE 0.9 % IV SOLN
2.0000 g | INTRAVENOUS | Status: DC
  Administered 2021-10-10 – 2021-10-15 (×6): 2 g via INTRAVENOUS
  Filled 2021-10-10 (×6): qty 20

## 2021-10-10 MED ORDER — FUROSEMIDE 10 MG/ML IJ SOLN
60.0000 mg | Freq: Once | INTRAMUSCULAR | Status: AC
  Administered 2021-10-10: 60 mg via INTRAVENOUS
  Filled 2021-10-10: qty 6

## 2021-10-10 MED ORDER — VITAL 1.5 CAL PO LIQD
1000.0000 mL | ORAL | Status: DC
  Administered 2021-10-10: 1000 mL

## 2021-10-10 MED ORDER — FENTANYL CITRATE (PF) 100 MCG/2ML IJ SOLN
INTRAMUSCULAR | Status: AC
  Filled 2021-10-10: qty 2

## 2021-10-10 MED ORDER — DEXTROSE 5 % IV SOLN
INTRAVENOUS | Status: DC
  Filled 2021-10-10 (×2): qty 1000

## 2021-10-10 MED ORDER — SODIUM CHLORIDE 0.9 % IV SOLN: 3.0000 g | Freq: Two times a day (BID) | INTRAVENOUS | Status: DC

## 2021-10-10 MED ORDER — MAGNESIUM SULFATE 2 GM/50ML IV SOLN
2.0000 g | Freq: Once | INTRAVENOUS | Status: AC
  Administered 2021-10-10: 2 g via INTRAVENOUS
  Filled 2021-10-10: qty 50

## 2021-10-10 MED ORDER — SODIUM CHLORIDE 0.9% FLUSH
10.0000 mL | INTRAVENOUS | Status: DC | PRN
  Administered 2021-10-15: 10 mL

## 2021-10-10 MED ORDER — SODIUM CHLORIDE 0.9% FLUSH
10.0000 mL | Freq: Two times a day (BID) | INTRAVENOUS | Status: DC
  Administered 2021-10-10 – 2021-10-16 (×14): 10 mL

## 2021-10-10 NOTE — Progress Notes (Signed)
Inpatient Rehabilitation Care Coordinator Discharge Note   Patient Details  Name: Mark [name] MRN: [id number] Date of Birth: November 26, 1955   Discharge location: D/c to acute due to medical concerns  Length of Stay: 2 days  Discharge activity level:    Home/community participation:    Patient response CM:[id number] Literacy - How often do you need to have someone help you when you read instructions, pamphlets, or other written material from your doctor or pharmacy?: Sometimes (vision issues)  Patient response HX:TAVWPV Isolation - How often do you feel lonely or isolated from those around you?: Patient unable to respond  Services provided included: MD, PT, RD, OT, SLP, RN, CM, Pharmacy, Neuropsych, SW, TR  Financial Services:  Charity fundraiser Utilized: [employer]  Choices offered to/list presented to: N/A  Follow-up services arranged:    Patient response to transportation need: Is the patient able to respond to transportation needs?: Yes In the past 12 months, has lack of transportation kept you from medical appointments or from getting medications?: No In the past 12 months, has lack of transportation kept you from meetings, work, or from getting things needed for daily living?: No    Comments (or additional information):  Patient/Family verbalized understanding of follow-up arrangements:     Individual responsible for coordination of the follow-up plan: contact pt wife  Confirmed correct DME delivered: Rana Snare 10/10/2021    Rana Snare

## 2021-10-10 NOTE — Progress Notes (Signed)
[place] Progress Note Patient Name: Mark [name] DOB: [birth year] MRN: [id number]   Date of Service  10/10/2021  HPI/Events of Note  Multiple issues: 1. ABG on 100%/PRVC 34/TV 620/P 8 = 7.30/51/82/25. 2. Troponin = 78 --> 58. Clinical picture c/w demand ischemia.   eICU Interventions  Plan: Continue present management.      Intervention Category Major Interventions: Respiratory failure - evaluation and management  Cheyenne Bordeaux Cornelia Copa 10/10/2021, 12:41 AM
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
## 2021-10-10 NOTE — Progress Notes (Signed)
Dr. Tamala Julian notified of lactic acid results.

## 2021-10-10 NOTE — Progress Notes (Signed)
PHARMACY NOTE:  ANTIMICROBIAL RENAL DOSAGE ADJUSTMENT  Current antimicrobial regimen includes a mismatch between antimicrobial dosage and estimated renal function.  As per policy approved by the Pharmacy & Therapeutics and Medical Executive Committees, the antimicrobial dosage will be adjusted accordingly.  Current antimicrobial dosage:  Unasyn 3g IV q12h  Indication: aspiration PNA  Renal Function:  Estimated Creatinine Clearance: 20.7 mL/min (A) (by C-G formula based on SCr of 2.2 mg/dL (H)).    Antimicrobial dosage has been changed to:  Unasyn 3g IV q12h  Additional comments: Pharmacy will continue to follow and adjust as needed.   Arturo Morton, PharmD, BCPS Please check AMION for all [place] contact numbers Clinical Pharmacist 10/10/2021 7:45 AM

## 2021-10-10 NOTE — Progress Notes (Signed)
[place] Progress Note Patient Name: Mark [name] DOB: 1955-11-18 MRN: [id number]   Date of Service  10/10/2021  HPI/Events of Note  Fever to 100.8 F - Nursing request for Tylenol. AST and ALT both markedly elevated, therefore, will avoid Tylenol.   eICU Interventions  Plan: Ice packs PRN.     Intervention Category Major Interventions: Other:  Lysle Dingwall 10/10/2021, 4:55 AM

## 2021-10-11 LAB — BASIC METABOLIC PANEL
Anion gap: 19 — ABNORMAL HIGH (ref 5–15)
BUN: 62 mg/dL — ABNORMAL HIGH (ref 8–23)
CO2: 27 mmol/L (ref 22–32)
Calcium: 7.3 mg/dL — ABNORMAL LOW (ref 8.9–10.3)
Chloride: 97 mmol/L — ABNORMAL LOW (ref 98–111)
Creatinine, Ser: 2.02 mg/dL — ABNORMAL HIGH (ref 0.61–1.24)
GFR, Estimated: 36 mL/min — ABNORMAL LOW (ref >60)
Glucose, Bld: 209 mg/dL — ABNORMAL HIGH (ref 70–99)
Potassium: 4.1 mmol/L (ref 3.5–5.1)
Sodium: 143 mmol/L (ref 135–145)

## 2021-10-11 LAB — POCT I-STAT 7, (LYTES, BLD GAS, ICA,H+H)
Acid-Base Excess: 6 mmol/L — ABNORMAL HIGH (ref 0.0–2.0)
Acid-Base Excess: 9 mmol/L — ABNORMAL HIGH (ref 0.0–2.0)
Bicarbonate: 30.6 mmol/L — ABNORMAL HIGH (ref 20.0–28.0)
Bicarbonate: 35.1 mmol/L — ABNORMAL HIGH (ref 20.0–28.0)
Calcium, Ion: 0.93 mmol/L — ABNORMAL LOW (ref 1.15–1.40)
Calcium, Ion: 0.94 mmol/L — ABNORMAL LOW (ref 1.15–1.40)
HCT: 21 % — ABNORMAL LOW (ref 39.0–52.0)
HCT: 23 % — ABNORMAL LOW (ref 39.0–52.0)
Hemoglobin: 7.1 g/dL — ABNORMAL LOW (ref 13.0–17.0)
Hemoglobin: 7.8 g/dL — ABNORMAL LOW (ref 13.0–17.0)
O2 Saturation: 96 %
O2 Saturation: 97 %
Patient temperature: 37.4
Potassium: 4 mmol/L (ref 3.5–5.1)
Potassium: 3.5 mmol/L (ref 3.5–5.1)
Sodium: 140 mmol/L (ref 135–145)
Sodium: 142 mmol/L (ref 135–145)
TCO2: 32 mmol/L (ref 22–32)
TCO2: 37 mmol/L — ABNORMAL HIGH (ref 22–32)
pCO2 arterial: 41.9 mmHg (ref 32–48)
pCO2 arterial: 55.8 mmHg — ABNORMAL HIGH (ref 32–48)
pH, Arterial: 7.473 — ABNORMAL HIGH (ref 7.35–7.45)
pH, Arterial: 7.406 (ref 7.35–7.45)
pO2, Arterial: 75 mmHg — ABNORMAL LOW (ref 83–108)
pO2, Arterial: 97 mmHg (ref 83–108)

## 2021-10-11 LAB — CBC
HCT: 21 % — ABNORMAL LOW (ref 39.0–52.0)
Hemoglobin: 6.9 g/dL — CL (ref 13.0–17.0)
MCH: 29.7 pg (ref 26.0–34.0)
MCHC: 32.9 g/dL (ref 30.0–36.0)
MCV: 90.5 fL (ref 80.0–100.0)
Platelets: 263 10*3/uL (ref 150–400)
RBC: 2.32 MIL/uL — ABNORMAL LOW (ref 4.22–5.81)
RDW: 16.5 % — ABNORMAL HIGH (ref 11.5–15.5)
WBC: 30.5 10*3/uL — ABNORMAL HIGH (ref 4.0–10.5)
nRBC: 0.1 % (ref 0.0–0.2)

## 2021-10-11 LAB — GLUCOSE, CAPILLARY
Glucose-Capillary: 154 mg/dL — ABNORMAL HIGH (ref 70–99)
Glucose-Capillary: 160 mg/dL — ABNORMAL HIGH (ref 70–99)
Glucose-Capillary: 166 mg/dL — ABNORMAL HIGH (ref 70–99)
Glucose-Capillary: 170 mg/dL — ABNORMAL HIGH (ref 70–99)
Glucose-Capillary: 205 mg/dL — ABNORMAL HIGH (ref 70–99)
Glucose-Capillary: 212 mg/dL — ABNORMAL HIGH (ref 70–99)

## 2021-10-11 LAB — HEMOGLOBIN A1C
Hgb A1c MFr Bld: 5.3 % (ref 4.8–5.6)
Mean Plasma Glucose: 105.41 mg/dL

## 2021-10-11 LAB — PREPARE RBC (CROSSMATCH)

## 2021-10-11 LAB — MAGNESIUM: Magnesium: 2.2 mg/dL (ref 1.7–2.4)

## 2021-10-11 LAB — HEMOGLOBIN AND HEMATOCRIT, BLOOD
HCT: 23.8 % — ABNORMAL LOW (ref 39.0–52.0)
Hemoglobin: 7.7 g/dL — ABNORMAL LOW (ref 13.0–17.0)

## 2021-10-11 LAB — PHOSPHORUS: Phosphorus: 4.3 mg/dL (ref 2.5–4.6)

## 2021-10-11 LAB — ABO/RH: ABO/RH(D): O POS

## 2021-10-11 MED ORDER — PROSOURCE TF PO LIQD
45.0000 mL | Freq: Every day | ORAL | Status: DC
Start: 2021-10-11 — End: 2021-10-16
  Administered 2021-10-11 – 2021-10-16 (×6): 45 mL
  Filled 2021-10-11 (×6): qty 45

## 2021-10-11 MED ORDER — GERHARDT'S BUTT CREAM
TOPICAL_CREAM | Freq: Every day | CUTANEOUS | Status: DC
  Administered 2021-10-11 – 2021-10-16 (×3): 1 via TOPICAL
  Filled 2021-10-11: qty 1

## 2021-10-11 MED ORDER — MIDODRINE HCL 5 MG PO TABS
5.0000 mg | ORAL_TABLET | Freq: Three times a day (TID) | ORAL | Status: DC
  Administered 2021-10-11 – 2021-10-13 (×6): 5 mg
  Filled 2021-10-11 (×6): qty 1

## 2021-10-11 MED ORDER — LACTATED RINGERS IV SOLN: INTRAVENOUS | Status: DC

## 2021-10-11 MED ORDER — PRAVASTATIN SODIUM 40 MG PO TABS
20.0000 mg | ORAL_TABLET | Freq: Every day | ORAL | Status: DC
  Administered 2021-10-11 – 2021-10-15 (×5): 20 mg
  Filled 2021-10-11 (×5): qty 1

## 2021-10-11 MED ORDER — VITAL 1.5 CAL PO LIQD
1000.0000 mL | ORAL | Status: DC
  Administered 2021-10-12 – 2021-10-16 (×4): 1000 mL
  Filled 2021-10-11: qty 1000

## 2021-10-11 MED ORDER — ARIPIPRAZOLE 2 MG PO TABS: 2.0000 mg | ORAL_TABLET | Freq: Every morning | ORAL | Status: DC

## 2021-10-11 MED ORDER — ARIPIPRAZOLE 2 MG PO TABS
2.0000 mg | ORAL_TABLET | Freq: Every day | ORAL | Status: DC
  Administered 2021-10-11 – 2021-10-16 (×6): 2 mg
  Filled 2021-10-11 (×6): qty 1

## 2021-10-11 MED ORDER — SODIUM CHLORIDE 0.9% IV SOLUTION: Freq: Once | INTRAVENOUS | Status: AC

## 2021-10-11 MED ORDER — INSULIN ASPART 100 UNIT/ML IJ SOLN
0.0000 [IU] | INTRAMUSCULAR | Status: DC
  Administered 2021-10-11 – 2021-10-12 (×5): 3 [IU] via SUBCUTANEOUS
  Administered 2021-10-12: 5 [IU] via SUBCUTANEOUS
  Administered 2021-10-12: 3 [IU] via SUBCUTANEOUS
  Administered 2021-10-12: 5 [IU] via SUBCUTANEOUS
  Administered 2021-10-12 (×2): 3 [IU] via SUBCUTANEOUS
  Administered 2021-10-13 (×2): 5 [IU] via SUBCUTANEOUS
  Administered 2021-10-13: 8 [IU] via SUBCUTANEOUS
  Administered 2021-10-13: 3 [IU] via SUBCUTANEOUS
  Administered 2021-10-13: 2 [IU] via SUBCUTANEOUS
  Administered 2021-10-13: 5 [IU] via SUBCUTANEOUS
  Administered 2021-10-14: 2 [IU] via SUBCUTANEOUS
  Administered 2021-10-14: 3 [IU] via SUBCUTANEOUS
  Administered 2021-10-14 (×2): 5 [IU] via SUBCUTANEOUS
  Administered 2021-10-14 – 2021-10-15 (×2): 3 [IU] via SUBCUTANEOUS
  Administered 2021-10-15 (×2): 2 [IU] via SUBCUTANEOUS
  Administered 2021-10-15 (×2): 3 [IU] via SUBCUTANEOUS
  Administered 2021-10-15: 5 [IU] via SUBCUTANEOUS

## 2021-10-11 MED ORDER — MIDODRINE HCL 5 MG PO TABS
5.0000 mg | ORAL_TABLET | Freq: Three times a day (TID) | ORAL | Status: DC
Start: 2021-10-11 — End: 2021-10-11
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
## 2021-10-11 NOTE — Progress Notes (Addendum)
[facility] Progress Note Patient Name: MCGWIRE DASARO DOB: 1956-02-05 MRN: [id number]   Date of Service  10/11/2021  HPI/Events of Note  Hgb 6.9, no obvious bleeding source per RN  ABG...7.47/41.9/75/30.6, sat 96% Vent...34/620 40% 8P  eICU Interventions  1 PRBC, continue respiratory care as before. Follow ARDS protocol to wean VT as tolerated to 56ml/ibw goal - by RT.      Intervention Category Intermediate Interventions: Other: (Anemia, ABG)  Elmer Sow 10/11/2021, 4:26 AM

## 2021-10-12 ENCOUNTER — Inpatient Hospital Stay (HOSPITAL_COMMUNITY): Payer: Medicare Other

## 2021-10-12 DIAGNOSIS — I639 Cerebral infarction, unspecified: Secondary | ICD-10-CM

## 2021-10-12 DIAGNOSIS — J13 Pneumonia due to Streptococcus pneumoniae: Secondary | ICD-10-CM

## 2021-10-12 LAB — BASIC METABOLIC PANEL
Anion gap: 12 (ref 5–15)
BUN: 65 mg/dL — ABNORMAL HIGH (ref 8–23)
CO2: 34 mmol/L — ABNORMAL HIGH (ref 22–32)
Calcium: 7.1 mg/dL — ABNORMAL LOW (ref 8.9–10.3)
Chloride: 97 mmol/L — ABNORMAL LOW (ref 98–111)
Creatinine, Ser: 1.36 mg/dL — ABNORMAL HIGH (ref 0.61–1.24)
GFR, Estimated: 58 mL/min — ABNORMAL LOW (ref >60)
Glucose, Bld: 221 mg/dL — ABNORMAL HIGH (ref 70–99)
Potassium: 3.5 mmol/L (ref 3.5–5.1)
Sodium: 143 mmol/L (ref 135–145)

## 2021-10-12 LAB — PHOSPHORUS: Phosphorus: 3.1 mg/dL (ref 2.5–4.6)

## 2021-10-12 LAB — BPAM RBC
Blood Product Expiration Date: 202303242359
ISSUE DATE / TIME: 202302240647
Unit Type and Rh: 5100

## 2021-10-12 LAB — POCT I-STAT 7, (LYTES, BLD GAS, ICA,H+H)
Acid-Base Excess: 12 mmol/L — ABNORMAL HIGH (ref 0.0–2.0)
Bicarbonate: 37.5 mmol/L — ABNORMAL HIGH (ref 20.0–28.0)
Calcium, Ion: 0.99 mmol/L — ABNORMAL LOW (ref 1.15–1.40)
HCT: 25 % — ABNORMAL LOW (ref 39.0–52.0)
Hemoglobin: 8.5 g/dL — ABNORMAL LOW (ref 13.0–17.0)
O2 Saturation: 97 %
Patient temperature: 36.2
Potassium: 3.5 mmol/L (ref 3.5–5.1)
Sodium: 143 mmol/L (ref 135–145)
TCO2: 39 mmol/L — ABNORMAL HIGH (ref 22–32)
pCO2 arterial: 55.9 mmHg — ABNORMAL HIGH (ref 32–48)
pH, Arterial: 7.431 (ref 7.35–7.45)
pO2, Arterial: 92 mmHg (ref 83–108)

## 2021-10-12 LAB — GLUCOSE, CAPILLARY
Glucose-Capillary: 160 mg/dL — ABNORMAL HIGH (ref 70–99)
Glucose-Capillary: 162 mg/dL — ABNORMAL HIGH (ref 70–99)
Glucose-Capillary: 177 mg/dL — ABNORMAL HIGH (ref 70–99)
Glucose-Capillary: 187 mg/dL — ABNORMAL HIGH (ref 70–99)
Glucose-Capillary: 220 mg/dL — ABNORMAL HIGH (ref 70–99)
Glucose-Capillary: 224 mg/dL — ABNORMAL HIGH (ref 70–99)

## 2021-10-12 LAB — CBC
HCT: 24 % — ABNORMAL LOW (ref 39.0–52.0)
Hemoglobin: 7.7 g/dL — ABNORMAL LOW (ref 13.0–17.0)
MCH: 29.5 pg (ref 26.0–34.0)
MCHC: 32.1 g/dL (ref 30.0–36.0)
MCV: 92 fL (ref 80.0–100.0)
Platelets: 175 10*3/uL (ref 150–400)
RBC: 2.61 MIL/uL — ABNORMAL LOW (ref 4.22–5.81)
RDW: 16.9 % — ABNORMAL HIGH (ref 11.5–15.5)
WBC: 26.8 10*3/uL — ABNORMAL HIGH (ref 4.0–10.5)
nRBC: 0.4 % — ABNORMAL HIGH (ref 0.0–0.2)

## 2021-10-12 LAB — TYPE AND SCREEN
ABO/RH(D): O POS
Antibody Screen: NEGATIVE
Unit division: 0

## 2021-10-12 LAB — MAGNESIUM: Magnesium: 2 mg/dL (ref 1.7–2.4)

## 2021-10-12 MED ORDER — AMIODARONE LOAD VIA INFUSION
150.0000 mg | Freq: Once | INTRAVENOUS | Status: AC
  Administered 2021-10-12: 150 mg via INTRAVENOUS
  Filled 2021-10-12: qty 83.34

## 2021-10-12 MED ORDER — AMIODARONE HCL IN DEXTROSE 360-4.14 MG/200ML-% IV SOLN
60.0000 mg/h | INTRAVENOUS | Status: AC
  Administered 2021-10-12 (×2): 60 mg/h via INTRAVENOUS
  Filled 2021-10-12: qty 200

## 2021-10-12 MED ORDER — POTASSIUM CHLORIDE 20 MEQ PO PACK
40.0000 meq | PACK | Freq: Once | ORAL | Status: AC
  Administered 2021-10-12: 40 meq
  Filled 2021-10-12: qty 2

## 2021-10-12 MED ORDER — HEPARIN (PORCINE) 25000 UT/250ML-% IV SOLN
1550.0000 [IU]/h | INTRAVENOUS | Status: DC
  Administered 2021-10-12: 17:00:00 800 [IU]/h via INTRAVENOUS
  Administered 2021-10-13: 23:00:00 1450 [IU]/h via INTRAVENOUS
  Filled 2021-10-12 (×2): qty 250

## 2021-10-12 MED ORDER — LACTATED RINGERS IV BOLUS
1000.0000 mL | Freq: Once | INTRAVENOUS | Status: AC
  Administered 2021-10-12: 1000 mL via INTRAVENOUS

## 2021-10-12 MED ORDER — POTASSIUM CHLORIDE 20 MEQ PO PACK: 40.0000 meq | PACK | Freq: Once | ORAL | Status: DC

## 2021-10-12 MED ORDER — AMIODARONE HCL IN DEXTROSE 360-4.14 MG/200ML-% IV SOLN
30.0000 mg/h | INTRAVENOUS | Status: DC
  Administered 2021-10-13 (×2): 30 mg/h via INTRAVENOUS
  Filled 2021-10-12 (×4): qty 200
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
## 2021-10-12 NOTE — Progress Notes (Signed)
Developed increased HR with a fib.  Remains on levophed.  Will start IV amiodarone.  Chesley Mires, MD [place] Pager - [phone] 10/12/2021, 6:46 PM

## 2021-10-12 NOTE — Progress Notes (Signed)
°  Amiodarone Drug - Drug Interaction Consult Note  Recommendations: Monitor patient for myopathy Amiodarone is metabolized by the cytochrome P450 system and therefore has the potential to cause many drug interactions. Amiodarone has an average plasma half-life of 50 days (range 20 to 100 days).   There is potential for drug interactions to occur several weeks or months after stopping treatment and the onset of drug interactions may be slow after initiating amiodarone.   Statins: Increased risk of myopathy. Simvastatin- restrict dose to 20mg  daily. Other statins: counsel patients to report any muscle pain or weakness immediately.  Thank You,  Sherlon Handing, PharmD, BCPS Please see amion for complete clinical pharmacist phone list\ 10/12/2021 6:54 PM

## 2021-10-12 NOTE — Progress Notes (Signed)
Interval Progress Note:   CT head with resolution in SDH however new small right cerebellar infarct. Neurology has been consulted. Discussed with Dr. Cheral Marker.   Signed,  Dr. Jose Persia Internal Medicine PGY-3   10/12/2021, 2:21 PM

## 2021-10-12 NOTE — Progress Notes (Signed)
ANTICOAGULATION CONSULT NOTE - Initial Consult  Pharmacy Consult for IV Heparin Indication: atrial fibrillation  Allergies  Allergen Reactions   Morphine And Related Nausea And Vomiting   Morphine And Related Nausea And Vomiting    Patient Measurements: Height: 6' (182.9 cm) Weight: 62.9 kg (138 lb 10.7 oz) IBW/kg (Calculated) : 77.6 Heparin Dosing Weight: Actual body weight  Vital Signs: Temp: 96.6 F (35.9 C) (02/25 1400) BP: 97/70 (02/25 1400) Pulse Rate: 100 (02/25 1400)  Labs: Recent Labs    10/09/21 2148 10/09/21 2331 10/09/21 2347 10/10/21 0327 10/10/21 0822 10/11/21 0353 10/11/21 0358 10/11/21 1033 10/11/21 1036 10/12/21 0351  HGB  --   --    < > 8.0*   < > 6.9*   < > 7.8* 7.7* 7.7*   8.5*  HCT  --   --    < > 25.4*   < > 21.0*   < > 23.0* 23.8* 24.0*   25.0*  PLT  --   --   --  358  --  263  --   --   --  175  CREATININE  --   --   --  2.20*  --  2.02*  --   --   --  1.36*  TROPONINIHS 78* 58*  --   --   --   --   --   --   --   --    < > = values in this interval not displayed.    Estimated Creatinine Clearance: 48.2 mL/min (A) (by C-G formula based on SCr of 1.36 mg/dL (H)).   Medical History: Past Medical History:  Diagnosis Date   Anxiety    Arthritis    left hand   Aspiration pneumonia (HCC)    Cancer (HCC)    tonsilal ca   COPD (chronic obstructive pulmonary disease) ([place])    Incontinence    night time, began recently   Memory deficit 11/18/2014   PEG tube 11/18/2014   removed summer 2017   Peripheral neuropathy 11/18/2014   Smokers' cough ([place])    Tonsillar cancer, S/P surgery, chemotherapy XRT 2009-followed at [facility] 11/18/2014   Wears dentures    full upper and lower    Medications:  Scheduled:   arformoterol  15 mcg Nebulization BID   ARIPiprazole  2 mg Per Tube Daily   aspirin  81 mg Per Tube Daily   budesonide (PULMICORT) nebulizer solution  0.5 mg Nebulization BID   chlorhexidine gluconate (MEDLINE KIT)  15 mL Mouth Rinse  BID   Chlorhexidine Gluconate Cloth  6 each Topical Q0600   clonazePAM  1 mg Per Tube BID   docusate  100 mg Per Tube BID   enoxaparin (LOVENOX) injection  30 mg Subcutaneous Q24H   feeding supplement (PROSource TF)  45 mL Per Tube Daily   Gerhardt's butt cream   Topical Daily   hydrocortisone sod succinate (SOLU-CORTEF) inj  100 mg Intravenous Q12H   insulin aspart  0-15 Units Subcutaneous Q4H   mouth rinse  15 mL Mouth Rinse 10 times per day   midodrine  5 mg Per Tube Q8H   pantoprazole sodium  40 mg Per Tube Daily   polyethylene glycol  17 g Per Tube Daily   pravastatin  20 mg Per Tube q1800   revefenacin  175 mcg Nebulization Daily   sodium chloride flush  10-40 mL Intracatheter Q12H   Infusions:   sodium chloride Stopped (10/12/21 0929)   cefTRIAXone (ROCEPHIN)  IV  200 mL/hr at 10/12/21 0934   feeding supplement (VITAL 1.5 CAL) 1,000 mL (10/12/21 0507)   fentaNYL infusion INTRAVENOUS 125 mcg/hr (10/12/21 0934)   lactated ringers 100 mL/hr at 10/12/21 1230   norepinephrine (LEVOPHED) Adult infusion 6 mcg/min (10/12/21 0934)   vasopressin Stopped (10/12/21 1674)    Assessment: 66 years of age male with history of atrial fibrillation that converted who presents with pneumococcal pneumonia and now back in atrial fibrillation with minimal RVR to start IV Heparin per pharmacy dosing. Patient has known history of SDH and repeat CT head this am showing resolved SDH. Neurosurgery ok with starting IV heparin per discussion with CCM. Noted anemia - received 1 unit pRBCs on 2/24. Hgb low-stable at 7.7. Last lovenox dose for DVT prophylaxis given 2/24 PM. AKI on admission - improving with SCr down to 1.26. Platelets 175. No bleeding reported.   Goal of Therapy:  Heparin level 0.3-0.7 units/ml Monitor platelets by anticoagulation protocol: Yes   Plan:  Discontinue Lovenox 33m SQ daily.  No Heparin bolus due to history of SDH.  Start heparin infusion at 800 units/hr Check anti-Xa level  in 6 hours and daily while on heparin Continue to monitor H&H and platelets  JSloan Leiter PharmD, BCPS, BCCCP Clinical Pharmacist Please refer to AQu[facility] LLCfor MOakhurstnumbers 10/12/2021,3:18 PM

## 2021-10-12 NOTE — Progress Notes (Signed)
RT NOTE: attempted SBT on patient this AM on CPAP/PSV of 15/8 at 0735, however patient's RR dropped to 5 and VE dropped to 5.0.  Placed patient back on full support ventilator settings and is tolerating well.  Will continue to monitor and wean as tolerated.

## 2021-10-12 NOTE — Progress Notes (Signed)
[facility] Progress Note Patient Name: Mark [name] DOB: Mar 15, 1956 MRN: [id number]   Date of Service  10/12/2021  HPI/Events of Note  Notified of ABG 7.431/55.9/92 while on PEEP 8, 40%, TV 620, rate 20.   eICU Interventions  Continue on current vent settings.     Intervention Category Minor Interventions: Other:  Elsie Lincoln 10/12/2021, 4:04 AM

## 2021-10-12 NOTE — Progress Notes (Signed)
Patient transported to CT and back to room 4V14 without complications.
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
## 2021-10-12 NOTE — Progress Notes (Signed)
K+ 3.5  Replaced per protocol  

## 2021-10-13 ENCOUNTER — Inpatient Hospital Stay (HOSPITAL_COMMUNITY): Payer: Medicare Other

## 2021-10-13 DIAGNOSIS — I6340 Cerebral infarction due to embolism of unspecified cerebral artery: Secondary | ICD-10-CM | POA: Insufficient documentation

## 2021-10-13 LAB — CBC
HCT: 27.9 % — ABNORMAL LOW (ref 39.0–52.0)
HCT: 25.3 % — ABNORMAL LOW (ref 39.0–52.0)
Hemoglobin: 8.5 g/dL — ABNORMAL LOW (ref 13.0–17.0)
Hemoglobin: 7.5 g/dL — ABNORMAL LOW (ref 13.0–17.0)
MCH: 28.7 pg (ref 26.0–34.0)
MCH: 28.3 pg (ref 26.0–34.0)
MCHC: 30.5 g/dL (ref 30.0–36.0)
MCHC: 29.6 g/dL — ABNORMAL LOW (ref 30.0–36.0)
MCV: 94.3 fL (ref 80.0–100.0)
MCV: 95.5 fL (ref 80.0–100.0)
Platelets: 162 10*3/uL (ref 150–400)
Platelets: 128 10*3/uL — ABNORMAL LOW (ref 150–400)
RBC: 2.96 MIL/uL — ABNORMAL LOW (ref 4.22–5.81)
RBC: 2.65 MIL/uL — ABNORMAL LOW (ref 4.22–5.81)
RDW: 16.9 % — ABNORMAL HIGH (ref 11.5–15.5)
RDW: 16.5 % — ABNORMAL HIGH (ref 11.5–15.5)
WBC: 30.7 10*3/uL — ABNORMAL HIGH (ref 4.0–10.5)
WBC: 19.7 10*3/uL — ABNORMAL HIGH (ref 4.0–10.5)
nRBC: 1 % — ABNORMAL HIGH (ref 0.0–0.2)
nRBC: 2.2 % — ABNORMAL HIGH (ref 0.0–0.2)

## 2021-10-13 LAB — CULTURE, BLOOD (ROUTINE X 2)
Culture: NO GROWTH
Culture: NO GROWTH
Special Requests: ADEQUATE
Special Requests: ADEQUATE

## 2021-10-13 LAB — GLUCOSE, CAPILLARY
Glucose-Capillary: 132 mg/dL — ABNORMAL HIGH (ref 70–99)
Glucose-Capillary: 229 mg/dL — ABNORMAL HIGH (ref 70–99)
Glucose-Capillary: 246 mg/dL — ABNORMAL HIGH (ref 70–99)
Glucose-Capillary: 261 mg/dL — ABNORMAL HIGH (ref 70–99)

## 2021-10-13 LAB — BASIC METABOLIC PANEL
Anion gap: 10 (ref 5–15)
BUN: 55 mg/dL — ABNORMAL HIGH (ref 8–23)
CO2: 33 mmol/L — ABNORMAL HIGH (ref 22–32)
Calcium: 7.5 mg/dL — ABNORMAL LOW (ref 8.9–10.3)
Chloride: 102 mmol/L (ref 98–111)
Creatinine, Ser: 1.06 mg/dL (ref 0.61–1.24)
GFR, Estimated: >60 mL/min (ref >60)
Glucose, Bld: 294 mg/dL — ABNORMAL HIGH (ref 70–99)
Potassium: 3.7 mmol/L (ref 3.5–5.1)
Sodium: 145 mmol/L (ref 135–145)

## 2021-10-13 LAB — HEPARIN LEVEL (UNFRACTIONATED)
Heparin Unfractionated: <0.1 IU/mL — ABNORMAL LOW (ref 0.30–0.70)
Heparin Unfractionated: <0.1 IU/mL — ABNORMAL LOW (ref 0.30–0.70)
Heparin Unfractionated: <0.1 IU/mL — ABNORMAL LOW (ref 0.30–0.70)

## 2021-10-13 LAB — MAGNESIUM: Magnesium: 2.2 mg/dL (ref 1.7–2.4)

## 2021-10-13 MED ORDER — MIDODRINE HCL 5 MG PO TABS
10.0000 mg | ORAL_TABLET | Freq: Three times a day (TID) | ORAL | Status: DC
  Administered 2021-10-13 – 2021-10-14 (×3): 10 mg
  Filled 2021-10-13 (×3): qty 2

## 2021-10-13 MED ORDER — FUROSEMIDE 10 MG/ML IJ SOLN
40.0000 mg | Freq: Once | INTRAMUSCULAR | Status: AC
  Administered 2021-10-13: 40 mg via INTRAVENOUS
  Filled 2021-10-13: qty 4

## 2021-10-13 MED ORDER — DEXMEDETOMIDINE HCL IN NACL 400 MCG/100ML IV SOLN
0.0000 ug/kg/h | INTRAVENOUS | Status: DC
  Administered 2021-10-13: 1 ug/kg/h via INTRAVENOUS
  Administered 2021-10-13: 0.4 ug/kg/h via INTRAVENOUS
  Administered 2021-10-14: 1 ug/kg/h via INTRAVENOUS
  Filled 2021-10-13 (×4): qty 100

## 2021-10-13 NOTE — Progress Notes (Signed)
ANTICOAGULATION CONSULT NOTE - Follow-Up Consult  Pharmacy Consult for IV Heparin Indication: atrial fibrillation  Allergies  Allergen Reactions   Morphine And Related Nausea And Vomiting   Morphine And Related Nausea And Vomiting    Patient Measurements: Height: 6' (182.9 cm) Weight: 62.9 kg (138 lb 10.7 oz) IBW/kg (Calculated) : 77.6 Heparin Dosing Weight: Actual body weight  Vital Signs: Temp: 98.4 F (36.9 C) (02/26 0313) Temp Source: Bladder (02/25 2000) BP: 109/76 (02/26 0313) Pulse Rate: 94 (02/26 0313)  Labs: Recent Labs    10/11/21 0353 10/11/21 0358 10/11/21 1036 10/12/21 0351 10/13/21 0336  HGB 6.9*   < > 7.7* 7.7*   8.5* 8.5*  HCT 21.0*   < > 23.8* 24.0*   25.0* 27.9*  PLT 263  --   --  175 162  HEPARINUNFRC  --   --   --   --  <0.10*  CREATININE 2.02*  --   --  1.36* 1.06   < > = values in this interval not displayed.     Estimated Creatinine Clearance: 61.8 mL/min (by C-G formula based on SCr of 1.06 mg/dL).   Medical History: Past Medical History:  Diagnosis Date   Anxiety    Arthritis    left hand   Aspiration pneumonia (HCC)    Cancer (HCC)    tonsilal ca   COPD (chronic obstructive pulmonary disease) ([place])    Incontinence    night time, began recently   Memory deficit 11/18/2014   PEG tube 11/18/2014   removed summer 2017   Peripheral neuropathy 11/18/2014   Smokers' cough ([place])    Tonsillar cancer, S/P surgery, chemotherapy XRT 2009-followed at [facility] 11/18/2014   Wears dentures    full upper and lower    Medications:  Scheduled:   arformoterol  15 mcg Nebulization BID   ARIPiprazole  2 mg Per Tube Daily   aspirin  81 mg Per Tube Daily   budesonide (PULMICORT) nebulizer solution  0.5 mg Nebulization BID   chlorhexidine gluconate (MEDLINE KIT)  15 mL Mouth Rinse BID   Chlorhexidine Gluconate Cloth  6 each Topical Q0600   clonazePAM  1 mg Per Tube BID   docusate  100 mg Per Tube BID   feeding supplement (PROSource TF)  45 mL Per  Tube Daily   Gerhardt's butt cream   Topical Daily   hydrocortisone sod succinate (SOLU-CORTEF) inj  100 mg Intravenous Q12H   insulin aspart  0-15 Units Subcutaneous Q4H   mouth rinse  15 mL Mouth Rinse 10 times per day   midodrine  5 mg Per Tube Q8H   pantoprazole sodium  40 mg Per Tube Daily   polyethylene glycol  17 g Per Tube Daily   pravastatin  20 mg Per Tube q1800   revefenacin  175 mcg Nebulization Daily   sodium chloride flush  10-40 mL Intracatheter Q12H   Infusions:   sodium chloride 10 mL/hr at 10/13/21 0000   amiodarone 30 mg/hr (10/13/21 0146)   cefTRIAXone (ROCEPHIN)  IV Stopped (10/12/21 0959)   feeding supplement (VITAL 1.5 CAL) 1,000 mL (10/13/21 0358)   fentaNYL infusion INTRAVENOUS 125 mcg/hr (10/13/21 0000)   heparin 800 Units/hr (10/13/21 0000)   lactated ringers 50 mL/hr at 10/13/21 0000   norepinephrine (LEVOPHED) Adult infusion 10 mcg/min (10/13/21 0153)   vasopressin Stopped (10/12/21 0947)    Assessment: 66 yo male with history of atrial fibrillation that converted who presents with pneumococcal pneumonia and now back in atrial fibrillation  with minimal RVR to start IV Heparin per pharmacy dosing. Patient has known history of SDH and repeat CT head 2/25 showing resolved SDH. Neurosurgery ok with starting IV heparin per discussion with CCM. Noted anemia - received 1 unit pRBCs on 2/24. Hgb low-stable at 7.7. Last lovenox dose for DVT prophylaxis given 2/24 PM. AKI on admission - improving with SCr down to 1.26. Platelets 175. No bleeding reported.   Initially heparin level <0.1 on 800 units/hr.  No IV issues noted per discussion with RN.  Pt did have bleeding at IV site where he pulled out IV access but this is now controlled.  Goal of Therapy:  Heparin level 0.3-0.7 units/ml Monitor platelets by anticoagulation protocol: Yes   Plan:  No Heparin bolus due to history of SDH.  Increase heparin infusion to 1000 units/hr Check anti-Xa level in 6 hours and  daily while on heparin Continue to monitor H&H and platelets  [employer], Pharm.D., BCPS Clinical Pharmacist  **Pharmacist phone directory can be found on [email or web address] listed under [facility].  10/13/2021 4:08 AM

## 2021-10-13 NOTE — Progress Notes (Signed)
RT NOTE: patient placed back on full support settings.  Patient's HR and BP elevated.  RN currently giving medication to help.  Will continue to montior.

## 2021-10-13 NOTE — Progress Notes (Signed)
RT NOTE: patient was placed on CPAP/PSV of 5/5 at 0750.  Patient is tolerating well at this time.  Will continue to monitor and wean as tolerated.
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
## 2021-10-13 NOTE — Progress Notes (Signed)
ANTICOAGULATION CONSULT NOTE - Follow-Up Consult  Pharmacy Consult for IV Heparin Indication: atrial fibrillation  Allergies  Allergen Reactions   Morphine And Related Nausea And Vomiting   Morphine And Related Nausea And Vomiting    Patient Measurements: Height: 6' (182.9 cm) Weight: 65.5 kg (144 lb 6.4 oz) IBW/kg (Calculated) : 77.6 Heparin Dosing Weight: Actual body weight  Vital Signs: Temp: 96.4 F (35.8 C) (02/26 2009) Temp Source: Bladder (02/26 2000) BP: 103/55 (02/26 2009) Pulse Rate: 62 (02/26 2009)  Labs: Recent Labs    10/11/21 0353 10/11/21 0358 10/12/21 0351 10/13/21 0336 10/13/21 1342 10/13/21 2051  HGB 6.9*   < > 7.7*   8.5* 8.5*  --  7.5*  HCT 21.0*   < > 24.0*   25.0* 27.9*  --  25.3*  PLT 263  --  175 162  --  128*  HEPARINUNFRC  --   --   --  <0.10* <0.10* <0.10*  CREATININE 2.02*  --  1.36* 1.06  --   --    < > = values in this interval not displayed.    Assessment: 66 yo male with history of atrial fibrillation that converted who presents with pneumococcal pneumonia and now back in atrial fibrillation with minimal RVR to start IV Heparin per pharmacy dosing. Patient has known history of SDH and repeat CT head 2/25 showing resolved SDH. Neurosurgery ok with starting IV heparin per discussion with CCM. Noted anemia - received 1 unit pRBCs on 2/24. Hgb low-stable at 7.7. Last lovenox dose for DVT prophylaxis given 2/24 PM.   Platelets are trending down now in setting of sepsis/infection (600s on admit from rehab and may have been reactive thrombocytosis). Current 4T score = 3-4 based on recent Lovenox exposure and elevated platelets on admission but probable causes mentioned above. Messaged CCM team to evaluate whether therapy change needs to occur - discussed case with Dr. Halford Chessman and plan is to continue and monitor CBC.   Heparin level remains <0.1 on 1250 units/hr. No issues with line or bleeding reported per RN. Plt down further to 128, Hgb 7-8s.    Goal of Therapy:  Heparin level 0.3-0.7 units/ml (consider aim for 0.3-0.5 with recent SDH) Monitor platelets by anticoagulation protocol: Yes   Plan:  No heparin bolus due to history of SDH.  Increase heparin infusion to 1450 units/hr Check anti-Xa level in 6 hours   Sherlon Handing, PharmD, BCPS Please see amion for complete clinical pharmacist phone list 10/13/2021 9:17 PM

## 2021-10-13 NOTE — Progress Notes (Addendum)
ANTICOAGULATION CONSULT NOTE - Follow-Up Consult  Pharmacy Consult for IV Heparin Indication: atrial fibrillation  Allergies  Allergen Reactions   Morphine And Related Nausea And Vomiting   Morphine And Related Nausea And Vomiting    Patient Measurements: Height: 6' (182.9 cm) Weight: 65.5 kg (144 lb 6.4 oz) IBW/kg (Calculated) : 77.6 Heparin Dosing Weight: Actual body weight  Vital Signs: Temp: 97.9 F (36.6 C) (02/26 1230) BP: 126/87 (02/26 1200) Pulse Rate: 67 (02/26 1230)  Labs: Recent Labs    10/11/21 0353 10/11/21 0358 10/11/21 1036 10/12/21 0351 10/13/21 0336 10/13/21 1342  HGB 6.9*   < > 7.7* 7.7*   8.5* 8.5*  --   HCT 21.0*   < > 23.8* 24.0*   25.0* 27.9*  --   PLT 263  --   --  175 162  --   HEPARINUNFRC  --   --   --   --  <0.10* <0.10*  CREATININE 2.02*  --   --  1.36* 1.06  --    < > = values in this interval not displayed.    Estimated Creatinine Clearance: 64.4 mL/min (by C-G formula based on SCr of 1.06 mg/dL).   Medical History: Past Medical History:  Diagnosis Date   Anxiety    Arthritis    left hand   Aspiration pneumonia ([place])    Cancer ([place])    tonsilal ca   COPD (chronic obstructive pulmonary disease) ([place])    Incontinence    night time, began recently   Memory deficit 11/18/2014   PEG tube 11/18/2014   removed summer 2017   Peripheral neuropathy 11/18/2014   Smokers' cough ([place])    Tonsillar cancer, S/P surgery, chemotherapy XRT 2009-followed at [facility] 11/18/2014   Wears dentures    full upper and lower    Medications:  Scheduled:   arformoterol  15 mcg Nebulization BID   ARIPiprazole  2 mg Per Tube Daily   aspirin  81 mg Per Tube Daily   budesonide (PULMICORT) nebulizer solution  0.5 mg Nebulization BID   chlorhexidine gluconate (MEDLINE KIT)  15 mL Mouth Rinse BID   Chlorhexidine Gluconate Cloth  6 each Topical Q0600   clonazePAM  1 mg Per Tube BID   docusate  100 mg Per Tube BID   feeding supplement (PROSource TF)  45 mL  Per Tube Daily   Gerhardt's butt cream   Topical Daily   insulin aspart  0-15 Units Subcutaneous Q4H   mouth rinse  15 mL Mouth Rinse 10 times per day   midodrine  10 mg Per Tube Q8H   pantoprazole sodium  40 mg Per Tube Daily   polyethylene glycol  17 g Per Tube Daily   pravastatin  20 mg Per Tube q1800   revefenacin  175 mcg Nebulization Daily   sodium chloride flush  10-40 mL Intracatheter Q12H   Infusions:   sodium chloride 10 mL/hr at 10/13/21 0942   amiodarone 30 mg/hr (10/13/21 1244)   cefTRIAXone (ROCEPHIN)  IV Stopped (10/13/21 1015)   dexmedetomidine (PRECEDEX) IV infusion 0.7 mcg/kg/hr (10/13/21 1244)   feeding supplement (VITAL 1.5 CAL) 60 mL/hr at 10/13/21 1201   fentaNYL infusion INTRAVENOUS 200 mcg/hr (10/13/21 1244)   heparin 1,000 Units/hr (10/13/21 1244)   norepinephrine (LEVOPHED) Adult infusion 8 mcg/min (10/13/21 1244)    Assessment: 66 yo male with history of atrial fibrillation that converted who presents with pneumococcal pneumonia and now back in atrial fibrillation with minimal RVR to start IV Heparin  per pharmacy dosing. Patient has known history of SDH and repeat CT head 2/25 showing resolved SDH. Neurosurgery ok with starting IV heparin per discussion with CCM. Noted anemia - received 1 unit pRBCs on 2/24. Hgb low-stable at 7.7. Last lovenox dose for DVT prophylaxis given 2/24 PM. AKI on admission - improving with SCr down to 1.26. Platelets 175. No bleeding reported.   Heparin level remains <0.1 on 1000 units/hr.   No IV issues noted per discussion with RN.   Pt did have bleeding at IV site where he pulled out IV access but this is now controlled.  Platelets are trending down now in setting of sepsis/infection (600s on admit from rehab and may have been reactive thrombocytosis). Current 4T score = 3-4 based on recent Lovenox exposure and elevated platelets on admission but probable causes mentioned above. Messaged CCM team to evaluate whether therapy change  needs to occur - discussed case with Dr. Halford Chessman and plan is to continue and monitor CBC.   Goal of Therapy:  Heparin level 0.3-0.7 units/ml Monitor platelets by anticoagulation protocol: Yes   Plan:  No Heparin bolus due to history of SDH.  Increase heparin infusion to 1250 units/hr Check anti-Xa level in 6 hours and CBC Daily while on heparin Continue to monitor H&H and platelets  Sloan Leiter, PharmD, BCPS, BCCCP Clinical Pharmacist Please refer to [facility] for [place] numbers 10/13/2021 2:04 PM

## 2021-10-13 NOTE — Progress Notes (Signed)
Patient transported to MRI and back to room 9Q42 without complication.
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
## 2021-10-14 ENCOUNTER — Inpatient Hospital Stay (HOSPITAL_COMMUNITY): Payer: Medicare Other

## 2021-10-14 DIAGNOSIS — J9621 Acute and chronic respiratory failure with hypoxia: Secondary | ICD-10-CM

## 2021-10-14 DIAGNOSIS — J9622 Acute and chronic respiratory failure with hypercapnia: Secondary | ICD-10-CM

## 2021-10-14 LAB — GLUCOSE, CAPILLARY
Glucose-Capillary: 138 mg/dL — ABNORMAL HIGH (ref 70–99)
Glucose-Capillary: 153 mg/dL — ABNORMAL HIGH (ref 70–99)
Glucose-Capillary: 185 mg/dL — ABNORMAL HIGH (ref 70–99)
Glucose-Capillary: 193 mg/dL — ABNORMAL HIGH (ref 70–99)
Glucose-Capillary: 216 mg/dL — ABNORMAL HIGH (ref 70–99)
Glucose-Capillary: 221 mg/dL — ABNORMAL HIGH (ref 70–99)

## 2021-10-14 LAB — BASIC METABOLIC PANEL WITH GFR
Anion gap: 8 (ref 5–15)
Anion gap: 9 (ref 5–15)
BUN: 44 mg/dL — ABNORMAL HIGH (ref 8–23)
BUN: 41 mg/dL — ABNORMAL HIGH (ref 8–23)
CO2: 38 mmol/L — ABNORMAL HIGH (ref 22–32)
CO2: 41 mmol/L — ABNORMAL HIGH (ref 22–32)
Calcium: 7.5 mg/dL — ABNORMAL LOW (ref 8.9–10.3)
Calcium: 7.6 mg/dL — ABNORMAL LOW (ref 8.9–10.3)
Chloride: 102 mmol/L (ref 98–111)
Chloride: 101 mmol/L (ref 98–111)
Creatinine, Ser: 0.77 mg/dL (ref 0.61–1.24)
Creatinine, Ser: 0.65 mg/dL (ref 0.61–1.24)
GFR, Estimated: >60 mL/min (ref >60)
GFR, Estimated: >60 mL/min (ref >60)
Glucose, Bld: 284 mg/dL — ABNORMAL HIGH (ref 70–99)
Glucose, Bld: 182 mg/dL — ABNORMAL HIGH (ref 70–99)
Potassium: 2.4 mmol/L — CL (ref 3.5–5.1)
Potassium: 2.9 mmol/L — ABNORMAL LOW (ref 3.5–5.1)
Sodium: 148 mmol/L — ABNORMAL HIGH (ref 135–145)
Sodium: 151 mmol/L — ABNORMAL HIGH (ref 135–145)

## 2021-10-14 LAB — CBC
HCT: 25.4 % — ABNORMAL LOW (ref 39.0–52.0)
Hemoglobin: 7.8 g/dL — ABNORMAL LOW (ref 13.0–17.0)
MCH: 29.1 pg (ref 26.0–34.0)
MCHC: 30.7 g/dL (ref 30.0–36.0)
MCV: 94.8 fL (ref 80.0–100.0)
Platelets: 121 K/uL — ABNORMAL LOW (ref 150–400)
RBC: 2.68 MIL/uL — ABNORMAL LOW (ref 4.22–5.81)
RDW: 16.8 % — ABNORMAL HIGH (ref 11.5–15.5)
WBC: 17 K/uL — ABNORMAL HIGH (ref 4.0–10.5)
nRBC: 1.9 % — ABNORMAL HIGH (ref 0.0–0.2)

## 2021-10-14 LAB — HEPATIC FUNCTION PANEL
ALT: 522 U/L — ABNORMAL HIGH (ref 0–44)
AST: 126 U/L — ABNORMAL HIGH (ref 15–41)
Albumin: 2.5 g/dL — ABNORMAL LOW (ref 3.5–5.0)
Alkaline Phosphatase: 291 U/L — ABNORMAL HIGH (ref 38–126)
Bilirubin, Direct: 0.2 mg/dL (ref 0.0–0.2)
Indirect Bilirubin: 0.4 mg/dL (ref 0.3–0.9)
Total Bilirubin: 0.6 mg/dL (ref 0.3–1.2)
Total Protein: 6.1 g/dL — ABNORMAL LOW (ref 6.5–8.1)

## 2021-10-14 LAB — LACTIC ACID, PLASMA: Lactic Acid, Venous: 1.3 mmol/L (ref 0.5–1.9)

## 2021-10-14 LAB — HEPARIN LEVEL (UNFRACTIONATED): Heparin Unfractionated: 0.23 [IU]/mL — ABNORMAL LOW (ref 0.30–0.70)

## 2021-10-14 MED ORDER — APIXABAN 5 MG PO TABS
5.0000 mg | ORAL_TABLET | Freq: Two times a day (BID) | ORAL | Status: DC
Start: 2021-10-14 — End: 2021-10-16
  Administered 2021-10-14 – 2021-10-16 (×5): 5 mg
  Filled 2021-10-14 (×5): qty 1

## 2021-10-14 MED ORDER — INSULIN ASPART 100 UNIT/ML IJ SOLN
3.0000 [IU] | INTRAMUSCULAR | Status: DC
  Administered 2021-10-14 – 2021-10-15 (×5): 3 [IU] via SUBCUTANEOUS

## 2021-10-14 MED ORDER — POTASSIUM CHLORIDE 10 MEQ/50ML IV SOLN
10.0000 meq | INTRAVENOUS | Status: AC
  Administered 2021-10-14 (×6): 10 meq via INTRAVENOUS
  Filled 2021-10-14 (×4): qty 50

## 2021-10-14 MED ORDER — ACETAZOLAMIDE 250 MG PO TABS
500.0000 mg | ORAL_TABLET | Freq: Once | ORAL | Status: DC
  Filled 2021-10-14: qty 2

## 2021-10-14 MED ORDER — AMIODARONE HCL 200 MG PO TABS
200.0000 mg | ORAL_TABLET | Freq: Two times a day (BID) | ORAL | Status: DC
  Administered 2021-10-14 – 2021-10-16 (×5): 200 mg
  Filled 2021-10-14 (×5): qty 1

## 2021-10-14 MED ORDER — VENLAFAXINE HCL 37.5 MG PO TABS
37.5000 mg | ORAL_TABLET | Freq: Two times a day (BID) | ORAL | Status: DC
  Administered 2021-10-14 – 2021-10-16 (×5): 37.5 mg
  Filled 2021-10-14 (×5): qty 1

## 2021-10-14 MED ORDER — POTASSIUM CHLORIDE 20 MEQ PO PACK
40.0000 meq | PACK | ORAL | Status: AC
  Administered 2021-10-14 (×2): 40 meq
  Filled 2021-10-14 (×2): qty 2

## 2021-10-14 MED ORDER — ACETAZOLAMIDE 250 MG PO TABS
500.0000 mg | ORAL_TABLET | Freq: Once | ORAL | Status: AC
  Administered 2021-10-14: 500 mg
  Filled 2021-10-14: qty 2

## 2021-10-14 MED ORDER — NOREPINEPHRINE 4 MG/250ML-% IV SOLN
0.0000 ug/min | INTRAVENOUS | Status: DC
  Administered 2021-10-14: 1 ug/min via INTRAVENOUS
  Administered 2021-10-15: 5 ug/min via INTRAVENOUS
  Administered 2021-10-16: 12 ug/min via INTRAVENOUS
  Filled 2021-10-14 (×4): qty 250

## 2021-10-14 MED ORDER — MIDODRINE HCL 5 MG PO TABS
10.0000 mg | ORAL_TABLET | Freq: Three times a day (TID) | ORAL | Status: DC
  Administered 2021-10-14 – 2021-10-16 (×5): 10 mg
  Filled 2021-10-14 (×5): qty 2

## 2021-10-14 MED ORDER — FUROSEMIDE 10 MG/ML IJ SOLN
40.0000 mg | Freq: Once | INTRAMUSCULAR | Status: AC
  Administered 2021-10-14: 40 mg via INTRAVENOUS
  Filled 2021-10-14: qty 4

## 2021-10-14 MED ORDER — AMIODARONE HCL 200 MG PO TABS: 200.0000 mg | ORAL_TABLET | Freq: Every day | ORAL | Status: DC

## 2021-10-14 MED ORDER — FREE WATER
100.0000 mL | Freq: Four times a day (QID) | Status: DC
  Administered 2021-10-14 – 2021-10-15 (×4): 100 mL

## 2021-10-14 MED ORDER — POTASSIUM CHLORIDE 20 MEQ PO PACK: 40.0000 meq | PACK | Freq: Once | ORAL | Status: DC
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
## 2021-10-14 NOTE — Plan of Care (Signed)
  Problem: Nutrition: Goal: Adequate nutrition will be maintained Outcome: Progressing   

## 2021-10-14 NOTE — Progress Notes (Signed)
[facility] Progress Note Patient Name: Mark [name] DOB: Feb 23, 1956 MRN: [id number]   Date of Service  10/14/2021  HPI/Events of Note  Hyponatremia - K+ = 2.4 and Creatinine - 0.77.  eICU Interventions  Plan: Will replace K+. Repeat BMP at 12 noon.     Intervention Category Major Interventions: Electrolyte abnormality - evaluation and management  [name] 10/14/2021, 4:55 AM

## 2021-10-14 NOTE — Progress Notes (Signed)
Interval Progress Note:   Telemetry reviewed. Patient converted to atrial fib with RVR at 1345. Prior rhythm sinus tachycardia.   Amiodarone converted from gtt to PO at 12:25; low suspicion that it contributed given long half life. RVR like secondary to patient's anxiety given his rates prior were similar.   For now, will monitor. If needed can add Metoprolol 2.5 mg IV pushes.   Signed,  Dr. Jose Persia Internal Medicine PGY-3  10/14/2021, 2:37 PM

## 2021-10-14 NOTE — Procedures (Signed)
Cortrak  Person Inserting Tube:  Alroy Dust, Shaton Lore L, RD Tube Type:  Cortrak - 43 inches Tube Size:  10 Tube Location:  Left nare Initial Placement:  Stomach Secured by: Bridle Technique Used to Measure Tube Placement:  Marking at nare/corner of mouth Cortrak Secured At:  66 cm  Cortrak Tube Team Note:  Consult received to place a Cortrak feeding tube.   X-ray is required, abdominal x-ray has been ordered by the Cortrak team. Please confirm tube placement before using the Cortrak tube.   If the tube becomes dislodged please keep the tube and contact the Cortrak team at [email or web address] (password [email or web address]) for replacement.  If after hours and replacement cannot be delayed, place a NG tube and confirm placement with an abdominal x-ray.    Hermina Barters RD, LDN Clinical Dietitian See Shea Evans for contact information.
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
## 2021-10-14 NOTE — Progress Notes (Signed)
Multiple attempts made by pt to pull out ETT. Reorientation unsuccessful. Rollene Rotunda, [place] notified. B soft wrist restraints ordered and initiated. Will continue to monitor pt closely.

## 2021-10-14 NOTE — Progress Notes (Signed)
ANTICOAGULATION CONSULT NOTE - Follow-Up Consult  Pharmacy Consult for IV Heparin Indication: atrial fibrillation  Allergies  Allergen Reactions   Morphine And Related Nausea And Vomiting   Morphine And Related Nausea And Vomiting    Patient Measurements: Height: 6' (182.9 cm) Weight: 65.5 kg (144 lb 6.4 oz) IBW/kg (Calculated) : 77.6 Heparin Dosing Weight: Actual body weight  Vital Signs: Temp: 95 F (35 C) (02/27 0400) Temp Source: Bladder (02/27 0400) BP: 97/65 (02/27 0400) Pulse Rate: 57 (02/27 0357)  Labs: Recent Labs    10/12/21 0351 10/12/21 0351 10/13/21 0336 10/13/21 1342 10/13/21 2051 10/14/21 0408  HGB 7.7*   8.5*  --  8.5*  --  7.5* 7.8*  HCT 24.0*   25.0*  --  27.9*  --  25.3* 25.4*  PLT 175  --  162  --  128* 121*  HEPARINUNFRC  --    < > <0.10* <0.10* <0.10* 0.23*  CREATININE 1.36*  --  1.06  --   --  0.77   < > = values in this interval not displayed.    Assessment: 66 yo male with history of atrial fibrillation that converted who presents with pneumococcal pneumonia and now back in atrial fibrillation with minimal RVR to start IV Heparin per pharmacy dosing. Patient has known history of SDH and repeat CT head 2/25 showing resolved SDH. Neurosurgery ok with starting IV heparin per discussion with CCM. Noted anemia - received 1 unit pRBCs on 2/24. Hgb low-stable at 7.7. Last lovenox dose for DVT prophylaxis given 2/24 PM.   Platelets are trending down now in setting of sepsis/infection (600s on admit from rehab and may have been reactive thrombocytosis). Current 4T score = 3-4 based on recent Lovenox exposure and elevated platelets on admission but probable causes mentioned above. Messaged CCM team to evaluate whether therapy change needs to occur - discussed case with Dr. Halford Chessman and plan is to continue and monitor CBC.   Heparin level remains <0.1 on 1250 units/hr. No issues with line or bleeding reported per RN. Plt down further to 128, Hgb 7-8s.   2/27  AM update:  Heparin level low but trending up  Goal of Therapy:  Heparin level 0.3-0.7 units/ml (consider aim for 0.3-0.5 with recent SDH) Monitor platelets by anticoagulation protocol: Yes   Plan:  Inc heparin to 1550 units/hr 1300 heparin level  Narda Bonds, PharmD, BCPS Clinical Pharmacist Phone: [phone]

## 2021-10-14 NOTE — Procedures (Signed)
Extubation Procedure Note  Patient Details:   Name: Mark [name] DOB: 12-29-1955 MRN: [id number]   Airway Documentation:    Vent end date: 10/14/21 Vent end time: 0915   Evaluation  O2 sats: currently acceptable Complications: Complications of desat and no gag.  Patient did tolerate procedure well. Bilateral Breath Sounds: Rhonchi, Diminished   No  Pt extubated per MD order, positive cuff leak noted. Pt sats did dropped and there for the pt was placed on 10L [place]. RT will continue to monitor and inform MD.   Corwin Levins 10/14/2021, 9:37 AM

## 2021-10-14 NOTE — Progress Notes (Signed)
ANTICOAGULATION CONSULT NOTE - Follow-Up Consult  Pharmacy Consult for IV Heparin Indication: atrial fibrillation  Allergies  Allergen Reactions   Morphine And Related Nausea And Vomiting   Morphine And Related Nausea And Vomiting    Patient Measurements: Height: 6' (182.9 cm) Weight: 67.6 kg (149 lb 0.5 oz) IBW/kg (Calculated) : 77.6 Heparin Dosing Weight: Actual body weight  Vital Signs: Temp: 94.8 F (34.9 C) (02/27 0714) Temp Source: Bladder (02/27 0714) BP: 105/64 (02/27 0700) Pulse Rate: 52 (02/27 0700)  Labs: Recent Labs    10/12/21 0351 10/12/21 0351 10/13/21 0336 10/13/21 1342 10/13/21 2051 10/14/21 0408  HGB 7.7*   8.5*  --  8.5*  --  7.5* 7.8*  HCT 24.0*   25.0*  --  27.9*  --  25.3* 25.4*  PLT 175  --  162  --  128* 121*  HEPARINUNFRC  --    < > <0.10* <0.10* <0.10* 0.23*  CREATININE 1.36*  --  1.06  --   --  0.77   < > = values in this interval not displayed.    Assessment: 66 yo male with history of atrial fibrillation that converted who presents with pneumococcal pneumonia and now back in atrial fibrillation with minimal RVR to start IV Heparin per pharmacy dosing. Patient has known history of SDH and repeat CT head 2/25 showing resolved SDH. Neurosurgery ok with starting IV heparin per discussion with CCM. Noted anemia - received 1 unit pRBCs on 2/24. Hgb low-stable at 7.7. Last lovenox dose for DVT prophylaxis given 2/24 PM.    Goal of Therapy:  Heparin level 0.3-0.7 units/ml (consider aim for 0.3-0.5 with recent SDH) Monitor platelets by anticoagulation protocol: Yes   Plan:  Heparin drip transitioned to apixaban 5mg  BID Will continue to monitor CBC and s/sx bleed  Thank you for allowing pharmacy to be a part of this patients care.  Donnald Garre, PharmD Clinical Pharmacist  Please check AMION for all [place] numbers After 10:00 PM, call [place] [phone]

## 2021-10-14 NOTE — Code Documentation (Signed)
Stroke Response Nurse Documentation Code Documentation  Mark [name] is a 66 y.o. male with complicated admission history. Code stroke was activated by ICU RN.  Last known well at 0900. ICU team at the bedside, sedation recently turned off with plans to extubate the patient when he suddenly went unresponsive and unable to follow commands.   Upon stroke team arrival, patient still intubated however now back to baseline following commands and moving all extremities. Patient is not a candidate for IV Thrombolytic due to previous stroke this admission. Patient is not a candidate for IR due to no suspected LVO.  Care/Plan: Code stroke cancelled due to patient returning to baseline.  Neurology and SRN discussed care with ICU team including RN.  Meda Klinefelter  Stroke Response RN
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
## 2021-10-14 NOTE — Progress Notes (Signed)
Brief Neuro Update:  I briefly saw [name] at bedside as a stroke code. He had transient unresponsiveness. Earlier, he was moving all of his arms and legs. LKW of 0900. Concern was for potential basilar stroke given he has new onset Afibb with RVR and is on Anticoagulation.  On my initial evaluation, eyes partially open, not following. He returned to his baseline about a minute into my exam and noted to be moving all extremities spontaneously and following commands.   I suspect that the fact that he was not following commands was probably more due to lethargy and tiredness and he perked up with vigorous tactile stimulation.  He had no focal deficit, unable to assess his speech due to intubation but he is following commands and no focal deficit.  Spoke at bedside with PCCM team. Felt that this is unlikely to be a stroke and stroke code cancelled.  PCCM team will let us know if they have any further questions or concerns for [place]. We will signoff.  [place] Pager Number [phone]

## 2021-10-15 DIAGNOSIS — I63111 Cerebral infarction due to embolism of right vertebral artery: Secondary | ICD-10-CM

## 2021-10-15 LAB — POCT I-STAT 7, (LYTES, BLD GAS, ICA,H+H)
Acid-Base Excess: 10 mmol/L — ABNORMAL HIGH (ref 0.0–2.0)
Acid-Base Excess: 10 mmol/L — ABNORMAL HIGH (ref 0.0–2.0)
Bicarbonate: 41.2 mmol/L — ABNORMAL HIGH (ref 20.0–28.0)
Bicarbonate: 39.1 mmol/L — ABNORMAL HIGH (ref 20.0–28.0)
Calcium, Ion: 1.21 mmol/L (ref 1.15–1.40)
Calcium, Ion: 1.14 mmol/L — ABNORMAL LOW (ref 1.15–1.40)
HCT: 31 % — ABNORMAL LOW (ref 39.0–52.0)
HCT: 29 % — ABNORMAL LOW (ref 39.0–52.0)
Hemoglobin: 10.5 g/dL — ABNORMAL LOW (ref 13.0–17.0)
Hemoglobin: 9.9 g/dL — ABNORMAL LOW (ref 13.0–17.0)
O2 Saturation: 96 %
O2 Saturation: 95 %
Patient temperature: 97.9
Potassium: 3.8 mmol/L (ref 3.5–5.1)
Potassium: 3.2 mmol/L — ABNORMAL LOW (ref 3.5–5.1)
Sodium: 152 mmol/L — ABNORMAL HIGH (ref 135–145)
Sodium: 156 mmol/L — ABNORMAL HIGH (ref 135–145)
TCO2: 44 mmol/L — ABNORMAL HIGH (ref 22–32)
TCO2: 42 mmol/L — ABNORMAL HIGH (ref 22–32)
pCO2 arterial: 104 mmHg (ref 32–48)
pCO2 arterial: 85.6 mmHg (ref 32–48)
pH, Arterial: 7.206 — ABNORMAL LOW (ref 7.35–7.45)
pH, Arterial: 7.266 — ABNORMAL LOW (ref 7.35–7.45)
pO2, Arterial: 108 mmHg (ref 83–108)
pO2, Arterial: 89 mmHg (ref 83–108)

## 2021-10-15 LAB — CBC
HCT: 30.9 % — ABNORMAL LOW (ref 39.0–52.0)
Hemoglobin: 9 g/dL — ABNORMAL LOW (ref 13.0–17.0)
MCH: 29.3 pg (ref 26.0–34.0)
MCHC: 29.1 g/dL — ABNORMAL LOW (ref 30.0–36.0)
MCV: 100.7 fL — ABNORMAL HIGH (ref 80.0–100.0)
Platelets: 131 10*3/uL — ABNORMAL LOW (ref 150–400)
RBC: 3.07 MIL/uL — ABNORMAL LOW (ref 4.22–5.81)
RDW: 17.2 % — ABNORMAL HIGH (ref 11.5–15.5)
WBC: 32.6 10*3/uL — ABNORMAL HIGH (ref 4.0–10.5)
nRBC: 2 % — ABNORMAL HIGH (ref 0.0–0.2)

## 2021-10-15 LAB — BASIC METABOLIC PANEL
Anion gap: 9 (ref 5–15)
BUN: 37 mg/dL — ABNORMAL HIGH (ref 8–23)
CO2: 38 mmol/L — ABNORMAL HIGH (ref 22–32)
Calcium: 8 mg/dL — ABNORMAL LOW (ref 8.9–10.3)
Chloride: 103 mmol/L (ref 98–111)
Creatinine, Ser: 0.63 mg/dL (ref 0.61–1.24)
GFR, Estimated: >60 mL/min (ref >60)
Glucose, Bld: 179 mg/dL — ABNORMAL HIGH (ref 70–99)
Potassium: 3.7 mmol/L (ref 3.5–5.1)
Sodium: 150 mmol/L — ABNORMAL HIGH (ref 135–145)

## 2021-10-15 LAB — GLUCOSE, CAPILLARY
Glucose-Capillary: 184 mg/dL — ABNORMAL HIGH (ref 70–99)
Glucose-Capillary: 176 mg/dL — ABNORMAL HIGH (ref 70–99)
Glucose-Capillary: 217 mg/dL — ABNORMAL HIGH (ref 70–99)
Glucose-Capillary: 55 mg/dL — ABNORMAL LOW (ref 70–99)
Glucose-Capillary: 61 mg/dL — ABNORMAL LOW (ref 70–99)
Glucose-Capillary: 109 mg/dL — ABNORMAL HIGH (ref 70–99)
Glucose-Capillary: 140 mg/dL — ABNORMAL HIGH (ref 70–99)
Glucose-Capillary: 134 mg/dL — ABNORMAL HIGH (ref 70–99)

## 2021-10-15 LAB — BLOOD GAS, ARTERIAL
Acid-Base Excess: 10.3 mmol/L — ABNORMAL HIGH (ref 0.0–2.0)
Bicarbonate: 43 mmol/L — ABNORMAL HIGH (ref 20.0–28.0)
Drawn by: 336832
O2 Saturation: 92 %
Patient temperature: 37
pCO2 arterial: 110 mmHg (ref 32–48)
pH, Arterial: 7.2 — ABNORMAL LOW (ref 7.35–7.45)
pO2, Arterial: 70 mmHg — ABNORMAL LOW (ref 83–108)

## 2021-10-15 MED ORDER — ACETAZOLAMIDE 250 MG PO TABS
500.0000 mg | ORAL_TABLET | Freq: Once | ORAL | Status: AC
  Administered 2021-10-15: 500 mg
  Filled 2021-10-15 (×2): qty 2

## 2021-10-15 MED ORDER — GABAPENTIN 250 MG/5ML PO SOLN
200.0000 mg | Freq: Three times a day (TID) | ORAL | Status: DC
  Administered 2021-10-15 – 2021-10-16 (×3): 200 mg
  Filled 2021-10-15 (×4): qty 4

## 2021-10-15 MED ORDER — CLONAZEPAM 1 MG PO TABS
1.0000 mg | ORAL_TABLET | Freq: Every day | ORAL | Status: DC
  Administered 2021-10-15: 1 mg
  Filled 2021-10-15: qty 1

## 2021-10-15 MED ORDER — ORAL CARE MOUTH RINSE: 15.0000 mL | Freq: Two times a day (BID) | OROMUCOSAL | Status: DC

## 2021-10-15 MED ORDER — FREE WATER
200.0000 mL | Freq: Four times a day (QID) | Status: DC
  Administered 2021-10-15 – 2021-10-16 (×4): 200 mL

## 2021-10-15 MED ORDER — POTASSIUM CHLORIDE 20 MEQ PO PACK
40.0000 meq | PACK | Freq: Once | ORAL | Status: AC
  Administered 2021-10-15: 40 meq
  Filled 2021-10-15: qty 2

## 2021-10-15 MED ORDER — DEXTROSE 50 % IV SOLN
INTRAVENOUS | Status: AC
  Administered 2021-10-15: 12.5 g via INTRAVENOUS
  Filled 2021-10-15: qty 50

## 2021-10-15 MED ORDER — SODIUM BICARBONATE 8.4 % IV SOLN
50.0000 meq | Freq: Once | INTRAVENOUS | Status: AC
  Administered 2021-10-15: 50 meq via INTRAVENOUS

## 2021-10-15 MED ORDER — INSULIN GLARGINE-YFGN 100 UNIT/ML ~~LOC~~ SOLN
10.0000 [IU] | Freq: Every day | SUBCUTANEOUS | Status: DC
  Administered 2021-10-15: 10 [IU] via SUBCUTANEOUS
  Filled 2021-10-15 (×2): qty 0.1

## 2021-10-15 MED ORDER — CALCIUM CHLORIDE 10 % IV SOLN
INTRAVENOUS | Status: AC
  Filled 2021-10-15: qty 10

## 2021-10-15 MED ORDER — CALCIUM GLUCONATE-NACL 1-0.675 GM/50ML-% IV SOLN
1.0000 g | Freq: Once | INTRAVENOUS | Status: AC
  Administered 2021-10-15: 1000 mg via INTRAVENOUS
  Filled 2021-10-15: qty 50

## 2021-10-15 MED ORDER — GABAPENTIN 250 MG/5ML PO SOLN
200.0000 mg | Freq: Three times a day (TID) | ORAL | Status: DC
  Filled 2021-10-15 (×2): qty 4

## 2021-10-15 MED ORDER — DIGOXIN 0.25 MG/ML IJ SOLN
0.2500 mg | Freq: Once | INTRAMUSCULAR | Status: AC
  Administered 2021-10-15: 0.25 mg via INTRAVENOUS
  Filled 2021-10-15: qty 2

## 2021-10-15 MED ORDER — CHLORHEXIDINE GLUCONATE 0.12 % MT SOLN
15.0000 mL | Freq: Two times a day (BID) | OROMUCOSAL | Status: DC
  Administered 2021-10-16: 15 mL via OROMUCOSAL

## 2021-10-15 MED ORDER — CLONAZEPAM 1 MG PO TABS: 1.0000 mg | ORAL_TABLET | Freq: Every day | ORAL | Status: DC

## 2021-10-15 MED ORDER — DEXTROSE 5 % IV SOLN: INTRAVENOUS | Status: DC

## 2021-10-15 MED ORDER — SODIUM BICARBONATE 8.4 % IV SOLN
INTRAVENOUS | Status: AC
  Filled 2021-10-15: qty 50

## 2021-10-15 MED ORDER — DEXTROSE 50 % IV SOLN: 12.5000 g | INTRAVENOUS | Status: AC
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
## 2021-10-15 NOTE — Plan of Care (Signed)
GOALS OF CARE DISCUSSION   The Clinical status was relayed to Patient's wife at bedside in detail.   Updated and notified of patients medical condition.     Patient remains unresponsive and will not open eyes to command.   Patient is having a weak cough and struggling to remove secretions.   Patient with increased WOB and using accessory muscles to breathe Explained to family course of therapy and the modalities     Patient with very high probablity of a very minimal chance of meaningful recovery despite all aggressive and optimal medical therapy.  Patient's wife decided to keep him DNR/DNI, orders were written. She would like to keep him on noninvasive mechanical ventilation including BiPAP to see if there is any signs of improvement     Family are satisfied with Plan of action and management. All questions answered    Jacky Kindle MD [facility] See Amion for pager If no response to pager, please call [phone] until 7pm After 7pm, Please call E-link [phone]

## 2021-10-15 NOTE — Progress Notes (Signed)
[facility] ADULT ICU REPLACEMENT PROTOCOL   The patient does apply for the [facility] Adult ICU Electrolyte Replacment Protocol based on the criteria listed below:   1.Exclusion criteria: TCTS patients, ECMO patients, and Dialysis patients 2. Is GFR >/= 30 ml/min? Yes.    Patient's GFR today is >60 3. Is SCr </= 2? Yes.   Patient's SCr is 0.63 mg/dL 4. Did SCr increase >/= 0.5 in 24 hours? No. 5.Pt's weight >40kg  Yes.   6. Abnormal electrolyte(s): K+ 3.7  7. Electrolytes replaced per protocol 8.  Call MD STAT for K+ </= 2.5, Phos </= 1, or Mag </= 1 Physician:  n/a  Mark [name] 10/15/2021 4:30 AM

## 2021-10-15 NOTE — Progress Notes (Signed)
SLP Cancellation Note  Patient Details Name: Mark [name] MRN: [id number] DOB: 09/23/1955   Cancelled treatment:       Reason Eval/Treat Not Completed: Medical issues which prohibited therapy;Patient not medically ready. Patient currently on BiPAP, not alert, and per MD patient may eventually require intubation. SLP to s/o at this time due to patient not being medically ready. Please reorder when patient's medical status improves. Thank you for this referral.    Sonia Baller, MA, CCC-SLP Speech Therapy

## 2021-10-16 ENCOUNTER — Inpatient Hospital Stay (HOSPITAL_COMMUNITY): Payer: Medicare Other

## 2021-10-16 LAB — GLUCOSE, CAPILLARY
Glucose-Capillary: 107 mg/dL — ABNORMAL HIGH (ref 70–99)
Glucose-Capillary: 118 mg/dL — ABNORMAL HIGH (ref 70–99)

## 2021-10-16 LAB — BASIC METABOLIC PANEL
Anion gap: 6 (ref 5–15)
BUN: 33 mg/dL — ABNORMAL HIGH (ref 8–23)
CO2: 38 mmol/L — ABNORMAL HIGH (ref 22–32)
Calcium: 8.1 mg/dL — ABNORMAL LOW (ref 8.9–10.3)
Chloride: 107 mmol/L (ref 98–111)
Creatinine, Ser: 0.54 mg/dL — ABNORMAL LOW (ref 0.61–1.24)
GFR, Estimated: >60 mL/min (ref >60)
Glucose, Bld: 134 mg/dL — ABNORMAL HIGH (ref 70–99)
Potassium: 3.2 mmol/L — ABNORMAL LOW (ref 3.5–5.1)
Sodium: 151 mmol/L — ABNORMAL HIGH (ref 135–145)

## 2021-10-16 LAB — CBC
HCT: 31.6 % — ABNORMAL LOW (ref 39.0–52.0)
Hemoglobin: 9.1 g/dL — ABNORMAL LOW (ref 13.0–17.0)
MCH: 28.9 pg (ref 26.0–34.0)
MCHC: 28.8 g/dL — ABNORMAL LOW (ref 30.0–36.0)
MCV: 100.3 fL — ABNORMAL HIGH (ref 80.0–100.0)
Platelets: 161 10*3/uL (ref 150–400)
RBC: 3.15 MIL/uL — ABNORMAL LOW (ref 4.22–5.81)
RDW: 17.5 % — ABNORMAL HIGH (ref 11.5–15.5)
WBC: 35.7 10*3/uL — ABNORMAL HIGH (ref 4.0–10.5)
nRBC: 0.7 % — ABNORMAL HIGH (ref 0.0–0.2)

## 2021-10-16 MED ORDER — SODIUM CHLORIDE 0.9 % IV SOLN
3.0000 g | Freq: Three times a day (TID) | INTRAVENOUS | Status: DC
  Administered 2021-10-16: 3 g via INTRAVENOUS
  Filled 2021-10-16: qty 8

## 2021-10-16 MED ORDER — HALOPERIDOL LACTATE 5 MG/ML IJ SOLN: 0.5000 mg | INTRAMUSCULAR | Status: DC | PRN

## 2021-10-16 MED ORDER — MIDAZOLAM HCL 2 MG/2ML IJ SOLN: 1.0000 mg | INTRAMUSCULAR | Status: DC | PRN

## 2021-10-16 MED ORDER — DIGOXIN 125 MCG PO TABS
0.1250 mg | ORAL_TABLET | Freq: Every day | ORAL | Status: DC
  Administered 2021-10-16: 0.125 mg
  Filled 2021-10-16: qty 1

## 2021-10-16 MED ORDER — GLYCOPYRROLATE 0.2 MG/ML IJ SOLN: 0.2000 mg | INTRAMUSCULAR | Status: DC | PRN

## 2021-10-16 MED ORDER — FENTANYL CITRATE (PF) 100 MCG/2ML IJ SOLN: 25.0000 ug | Freq: Once | INTRAMUSCULAR | Status: DC

## 2021-10-16 MED ORDER — ACETAMINOPHEN 650 MG RE SUPP: 650.0000 mg | Freq: Four times a day (QID) | RECTAL | Status: DC | PRN

## 2021-10-16 MED ORDER — MIDODRINE HCL 5 MG PO TABS: 20.0000 mg | ORAL_TABLET | Freq: Three times a day (TID) | ORAL | Status: DC

## 2021-10-16 MED ORDER — FENTANYL BOLUS VIA INFUSION
25.0000 ug | INTRAVENOUS | Status: DC | PRN
  Filled 2021-10-16: qty 100

## 2021-10-16 MED ORDER — HALOPERIDOL 0.5 MG PO TABS: 0.5000 mg | ORAL_TABLET | ORAL | Status: DC | PRN

## 2021-10-16 MED ORDER — NOREPINEPHRINE 4 MG/250ML-% IV SOLN
INTRAVENOUS | Status: AC
  Administered 2021-10-16: 4 mg
  Filled 2021-10-16: qty 250

## 2021-10-16 MED ORDER — ONDANSETRON 4 MG PO TBDP: 4.0000 mg | ORAL_TABLET | Freq: Four times a day (QID) | ORAL | Status: DC | PRN

## 2021-10-16 MED ORDER — GLYCOPYRROLATE 1 MG PO TABS: 1.0000 mg | ORAL_TABLET | ORAL | Status: DC | PRN

## 2021-10-16 MED ORDER — GLYCOPYRROLATE 0.2 MG/ML IJ SOLN
0.2000 mg | INTRAMUSCULAR | Status: DC | PRN
  Filled 2021-10-16: qty 1

## 2021-10-16 MED ORDER — POTASSIUM CHLORIDE 10 MEQ/50ML IV SOLN
10.0000 meq | INTRAVENOUS | Status: AC
  Administered 2021-10-16 (×4): 10 meq via INTRAVENOUS
  Filled 2021-10-16 (×4): qty 50

## 2021-10-16 MED ORDER — BIOTENE DRY MOUTH MT LIQD
15.0000 mL | OROMUCOSAL | Status: DC | PRN
Start: 2021-10-16 — End: 2021-10-16

## 2021-10-16 MED ORDER — FREE WATER
200.0000 mL | Status: DC
  Administered 2021-10-16: 200 mL

## 2021-10-16 MED ORDER — HALOPERIDOL LACTATE 2 MG/ML PO CONC
0.5000 mg | ORAL | Status: DC | PRN
  Filled 2021-10-16: qty 0.3

## 2021-10-16 MED ORDER — ACETAZOLAMIDE 250 MG PO TABS
500.0000 mg | ORAL_TABLET | Freq: Once | ORAL | Status: AC
Start: 2021-10-16 — End: 2021-10-16
  Administered 2021-10-16: 500 mg
  Filled 2021-10-16: qty 2

## 2021-10-16 MED ORDER — POTASSIUM CHLORIDE 20 MEQ PO PACK
20.0000 meq | PACK | ORAL | Status: AC
  Administered 2021-10-16 (×2): 20 meq
  Filled 2021-10-16 (×2): qty 1

## 2021-10-16 MED ORDER — ONDANSETRON HCL 4 MG/2ML IJ SOLN: 4.0000 mg | Freq: Four times a day (QID) | INTRAMUSCULAR | Status: DC | PRN

## 2021-10-16 MED ORDER — FENTANYL 2500MCG IN NS 250ML (10MCG/ML) PREMIX INFUSION
25.0000 ug/h | INTRAVENOUS | Status: DC
  Administered 2021-10-16: 25 ug/h via INTRAVENOUS
  Filled 2021-10-16 (×2): qty 250

## 2021-10-16 MED ORDER — WHITE PETROLATUM EX OINT
TOPICAL_OINTMENT | CUTANEOUS | Status: DC | PRN
  Filled 2021-10-16: qty 28.35

## 2021-10-16 MED ORDER — ACETAMINOPHEN 325 MG PO TABS: 650.0000 mg | ORAL_TABLET | Freq: Four times a day (QID) | ORAL | Status: DC | PRN

## 2021-10-16 MED ORDER — POLYVINYL ALCOHOL 1.4 % OP SOLN
1.0000 [drp] | Freq: Four times a day (QID) | OPHTHALMIC | Status: DC | PRN
  Filled 2021-10-16: qty 15

## 2021-10-16 DEATH — deceased

## 2021-11-16 NOTE — Progress Notes (Signed)
?   2021/11/02 1501  ?Clinical Encounter Type  ?Visited With Patient and family together  ?Visit Type Initial;Spiritual support;Other (Comment) ?(EOL)  ?Referral From Nurse  ?Consult/Referral To Chaplain  ?Spiritual Encounters  ?Spiritual Needs [facility] text;Emotional;Grief support  ? ?Chaplain responded to nurse to support family of patient who was at the EOL. Chaplain provided sacred text, prayer, and emotional support to wife, children, grandchildren, brothers, and sisters. ? ?Melody Haver, Resident Chaplain ?([phone] ?
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
## 2021-11-16 NOTE — Progress Notes (Signed)
Patient was taken off of BiPAP ?After 30 minutes he became unresponsive, not able to wake up with deep painful stimuli ?ABGs were drawn, showed PCO2 over 110 ? ?Spoke with patient's wife and 2 sons explained that he is not able to come off of mechanical ventilation invasive/noninvasive.  Informed that if he gets reintubated he will require tracheostomy and will likely remain on ventilator for lifelong considering he is cachectic with severe COPD. ? ?Patient's family decided that he did not wanted tracheostomy or prolonged ventilation, they would like to keep him comfortable and avoid aggressive measures. ? ?Comfort care orders were written, no labs drawn or DVT prophylaxis should be given ? ? ?Patient's family was satisfied with questions answered. ? ?  ?Jacky Kindle MD ?[facility] ?See Amion for pager ?If no response to pager, please call [phone] until 7pm ?After 7pm, Please call E-link [phone] ? ?

## 2021-11-16 NOTE — Progress Notes (Signed)
Pharmacy Antibiotic Note ? ?Mark [name] is a 66 y.o. male admitted on 09/28/2021 with aspiration pneumonia.  Pharmacy has been consulted for Unasyn dosing. ? ?Plan: ?Unasyn 3gm q8hr ?Will monitor for acute changes in renal function and adjust as needed ?F/u cultures results and de-escalate as appropriate ? ? ?Height: 6' (182.9 cm) ?Weight: 67.9 kg (149 lb 11.1 oz) ?IBW/kg (Calculated) : 77.6 ? ?Temp (24hrs), Avg:98.3 ?F (36.8 ?C), Min:97.5 ?F (36.4 ?C), Max:99.1 ?F (37.3 ?C) ? ?Recent Labs  ?Lab 10/09/21 ?1511 10/10/21 ?0327 10/10/21 ?4854 10/10/21 ?1703 10/11/21 ?6270 10/13/21 ?3500 10/13/21 ?2051 10/14/21 ?0408 10/14/21 ?1123 10/15/21 ?9381 15-Nov-2021 ?0406  ?WBC  --    < >  --   --    < > 30.7* 19.7* 17.0*  --  32.6* 35.7*  ?CREATININE  --    < >  --   --    < > 1.06  --  0.77 0.65 0.63 0.54*  ?LATICACIDVEN 6.1*  --  8.2* >9.0*  --   --   --   --  1.3  --   --   ? < > = values in this interval not displayed.  ?  ?Estimated Creatinine Clearance: 88.4 mL/min (A) (by C-G formula based on SCr of 0.54 mg/dL (L)).   ? ?Allergies  ?Allergen Reactions  ? Morphine And Related Nausea And Vomiting  ? ? ?Thank you for allowing pharmacy to be a part of this patient?s care. ? ?Donnald Garre, PharmD ?Clinical Pharmacist ? ?Please check AMION for all [place] numbers ?After 10:00 PM, call [place] [phone] ? ? ?

## 2021-11-16 NOTE — Progress Notes (Addendum)
Family at bedside. Pt's heart stopped on the monitor. This RN and Carolynn Comment, RN listened for 2 minutes. No heart sounds, lung sounds, no pupil response, no pulses. Time of death November 26, 1516. Dr. Tacy Learn notified. [employer] notified. ?

## 2021-11-16 NOTE — Progress Notes (Signed)
Nutrition Brief Note ° °Chart reviewed. °Pt now transitioning to comfort care.  °No further nutrition interventions planned at this time.  °Please re-consult as needed. ° ° °Kate Jonnelle Lawniczak, MS, RD, LDN °Inpatient Clinical Dietitian °Please see AMiON for contact information. ° ° °

## 2021-11-16 NOTE — Progress Notes (Signed)
[facility] ADULT ICU REPLACEMENT PROTOCOL ? ? ?The patient does apply for the [facility] Adult ICU Electrolyte Replacment Protocol based on the criteria listed below:  ? ?1.Exclusion criteria: TCTS patients, ECMO patients, and Dialysis patients ?2. Is GFR >/= 30 ml/min? Yes.    ?Patient's GFR today is >60 ?3. Is SCr </= 2? Yes.   ?Patient's SCr is 0.54 mg/dL ?4. Did SCr increase >/= 0.5 in 24 hours? No. ?5.Pt's weight >40kg  Yes.   ?6. Abnormal electrolyte(s):   K 3.2  ?7. Electrolytes replaced per protocol ?8.  Call MD STAT for K+ </= 2.5, Phos </= 1, or Mag </= 1 ?Physician:  R. Nallamothu ? ?Mark [name] 2021-11-14 5:32 AM ? ?

## 2021-11-16 NOTE — Progress Notes (Signed)
Chaplain received page from pt's RN notifying that family is preparing to transition pt to comfort care and would like a visit from the chaplain.  ? ?Chaplain introduced spiritual care and offered support. Chaplain asked open ended questions to facilitate story telling and emotional expression. [name] is accompanied by his wife, [name], and sons [name] and [name]. Family shared that [name] has been in the hospital for about a month now and requested prayer. 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 He has two sons and two grandsons. [name] and [name] have known one another all of their lives, they met in elementary school in [place]. They've been married 89 years and have lived in [place] for the last 30+. [name] helped [name] care for her mother for around a decade and had a special relationship with her, the two often woke up early to have coffee and go to yard sales. The family describes [name] as someone who worked hard, loved the [place], and would do anything for anyone. Chaplain provided prayer in the family's tradition and reviewed their hopes and goals for the next step in [name]'s medical care. They have a few relatives coming to town and would like to wait to discontinue support until those individuals have arrived. Chaplain communicated these needs to the medical team and reminded family that chaplains are available 24/7. The family is appropriately tearful, but supportive of one another and coping well. ? ?Please page as further needs arise. ? ?[name]. Elyn Peers, M.Div. BCC ?Chaplain ?Pager [phone] ?Office [phone] ? ? ? ? ? 11-06-2021 1100  ?Clinical Encounter Type  ?Visited With Patient and family together  ?Visit Type Initial;Spiritual support;Patient actively dying  ?Referral From Nurse  ?Spiritual Encounters  ?Spiritual Needs Emotional;Grief support;Prayer  ? ? ?

## 2021-11-16 NOTE — Progress Notes (Signed)
Chaplain and family are at bedside. Fentanyl hung. Levophed turned off per comfort care protocol.  ?
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
## 2021-11-16 DEATH — deceased
